# Patient Record
Sex: Female | Born: 1970 | Race: White | Hispanic: No | Smoking: Never smoker
Health system: Southern US, Community
[De-identification: ages and names within clinical notes are randomized; demographics above are authoritative.]

## PROBLEM LIST (undated history)

## (undated) ENCOUNTER — Emergency Department (HOSPITAL_COMMUNITY): Disposition: A | Discharge status: Home / Self Care | Payer: PRIVATE HEALTH INSURANCE

## (undated) DIAGNOSIS — I5042 Chronic combined systolic (congestive) and diastolic (congestive) heart failure: Secondary | ICD-10-CM

## (undated) DIAGNOSIS — I2510 Atherosclerotic heart disease of native coronary artery without angina pectoris: Secondary | ICD-10-CM

## (undated) DIAGNOSIS — F32A Depression, unspecified: Secondary | ICD-10-CM

## (undated) DIAGNOSIS — G4730 Sleep apnea, unspecified: Secondary | ICD-10-CM

## (undated) DIAGNOSIS — F329 Major depressive disorder, single episode, unspecified: Secondary | ICD-10-CM

## (undated) DIAGNOSIS — Z95810 Presence of automatic (implantable) cardiac defibrillator: Secondary | ICD-10-CM

## (undated) DIAGNOSIS — I10 Essential (primary) hypertension: Secondary | ICD-10-CM

## (undated) DIAGNOSIS — M1990 Unspecified osteoarthritis, unspecified site: Secondary | ICD-10-CM

## (undated) DIAGNOSIS — K219 Gastro-esophageal reflux disease without esophagitis: Secondary | ICD-10-CM

## (undated) DIAGNOSIS — R809 Proteinuria, unspecified: Secondary | ICD-10-CM

## (undated) DIAGNOSIS — I499 Cardiac arrhythmia, unspecified: Secondary | ICD-10-CM

## (undated) HISTORY — DX: Depression, unspecified: F32.A

## (undated) HISTORY — PX: TOOTH EXTRACTION: SUR596

## (undated) HISTORY — PX: NOSE SURGERY: SHX723

## (undated) HISTORY — PX: WISDOM TOOTH EXTRACTION: SHX21

## (undated) HISTORY — DX: Essential (primary) hypertension: I10

## (undated) HISTORY — PX: TONSILLECTOMY: SUR1361

## (undated) HISTORY — DX: Proteinuria, unspecified: R80.9

## (undated) HISTORY — PX: CARDIAC CATHETERIZATION: SHX172

## (undated) HISTORY — DX: Major depressive disorder, single episode, unspecified: F32.9

## (undated) HISTORY — DX: Atherosclerotic heart disease of native coronary artery without angina pectoris: I25.10

## (undated) HISTORY — DX: Chronic combined systolic (congestive) and diastolic (congestive) heart failure: I50.42

---

## 1997-11-18 ENCOUNTER — Encounter: Admission: RE | Admit: 1997-11-18 | Discharge: 1997-11-18 | Payer: Self-pay | Admitting: Family Medicine

## 1998-04-28 ENCOUNTER — Encounter: Admission: RE | Admit: 1998-04-28 | Discharge: 1998-04-28 | Payer: Self-pay | Admitting: Family Medicine

## 1999-01-05 ENCOUNTER — Encounter: Admission: RE | Admit: 1999-01-05 | Discharge: 1999-01-05 | Payer: Self-pay | Admitting: Family Medicine

## 1999-01-10 ENCOUNTER — Encounter: Admission: RE | Admit: 1999-01-10 | Discharge: 1999-01-10 | Payer: Self-pay | Admitting: Family Medicine

## 1999-12-05 ENCOUNTER — Encounter: Admission: RE | Admit: 1999-12-05 | Discharge: 1999-12-05 | Payer: Self-pay | Admitting: Family Medicine

## 1999-12-07 ENCOUNTER — Encounter: Admission: RE | Admit: 1999-12-07 | Discharge: 1999-12-07 | Payer: Self-pay | Admitting: *Deleted

## 1999-12-11 ENCOUNTER — Encounter: Admission: RE | Admit: 1999-12-11 | Discharge: 1999-12-11 | Payer: Self-pay | Admitting: Family Medicine

## 2000-10-16 ENCOUNTER — Other Ambulatory Visit: Admission: RE | Admit: 2000-10-16 | Discharge: 2000-10-16 | Payer: Self-pay | Admitting: *Deleted

## 2001-01-09 ENCOUNTER — Ambulatory Visit (HOSPITAL_COMMUNITY): Admission: RE | Admit: 2001-01-09 | Discharge: 2001-01-09 | Payer: Self-pay | Admitting: Family Medicine

## 2001-01-09 ENCOUNTER — Encounter: Payer: Self-pay | Admitting: Family Medicine

## 2001-10-19 ENCOUNTER — Other Ambulatory Visit: Admission: RE | Admit: 2001-10-19 | Discharge: 2001-10-19 | Payer: Self-pay | Admitting: Obstetrics and Gynecology

## 2002-08-23 ENCOUNTER — Other Ambulatory Visit: Admission: RE | Admit: 2002-08-23 | Discharge: 2002-08-23 | Payer: Self-pay | Admitting: Obstetrics & Gynecology

## 2002-11-15 ENCOUNTER — Ambulatory Visit (HOSPITAL_COMMUNITY): Admission: RE | Admit: 2002-11-15 | Discharge: 2002-11-15 | Payer: Self-pay | Admitting: Obstetrics & Gynecology

## 2003-01-12 ENCOUNTER — Ambulatory Visit (HOSPITAL_COMMUNITY): Admission: RE | Admit: 2003-01-12 | Discharge: 2003-01-12 | Payer: Self-pay | Admitting: *Deleted

## 2003-03-17 ENCOUNTER — Inpatient Hospital Stay (HOSPITAL_COMMUNITY): Admission: AD | Admit: 2003-03-17 | Discharge: 2003-03-19 | Payer: Self-pay | Admitting: Obstetrics & Gynecology

## 2003-03-20 ENCOUNTER — Encounter: Admission: RE | Admit: 2003-03-20 | Discharge: 2003-04-19 | Payer: Self-pay | Admitting: Obstetrics & Gynecology

## 2003-04-20 ENCOUNTER — Encounter: Admission: RE | Admit: 2003-04-20 | Discharge: 2003-05-20 | Payer: Self-pay | Admitting: Obstetrics & Gynecology

## 2003-04-28 ENCOUNTER — Other Ambulatory Visit: Admission: RE | Admit: 2003-04-28 | Discharge: 2003-04-28 | Payer: Self-pay | Admitting: Obstetrics & Gynecology

## 2003-05-21 ENCOUNTER — Encounter: Admission: RE | Admit: 2003-05-21 | Discharge: 2003-06-20 | Payer: Self-pay | Admitting: Obstetrics & Gynecology

## 2003-07-19 ENCOUNTER — Encounter: Admission: RE | Admit: 2003-07-19 | Discharge: 2003-08-18 | Payer: Self-pay | Admitting: Obstetrics & Gynecology

## 2003-09-18 ENCOUNTER — Encounter: Admission: RE | Admit: 2003-09-18 | Discharge: 2003-10-18 | Payer: Self-pay | Admitting: Obstetrics & Gynecology

## 2003-11-18 ENCOUNTER — Encounter: Admission: RE | Admit: 2003-11-18 | Discharge: 2003-12-18 | Payer: Self-pay | Admitting: Obstetrics & Gynecology

## 2003-12-19 ENCOUNTER — Encounter: Admission: RE | Admit: 2003-12-19 | Discharge: 2004-01-18 | Payer: Self-pay | Admitting: Obstetrics & Gynecology

## 2004-12-13 ENCOUNTER — Encounter: Admission: RE | Admit: 2004-12-13 | Discharge: 2004-12-13 | Payer: Self-pay | Admitting: Obstetrics & Gynecology

## 2004-12-27 ENCOUNTER — Encounter: Admission: RE | Admit: 2004-12-27 | Discharge: 2004-12-27 | Payer: Self-pay | Admitting: Obstetrics & Gynecology

## 2006-12-03 ENCOUNTER — Ambulatory Visit: Payer: Self-pay | Admitting: *Deleted

## 2006-12-03 ENCOUNTER — Inpatient Hospital Stay (HOSPITAL_COMMUNITY): Admission: AD | Admit: 2006-12-03 | Discharge: 2006-12-05 | Payer: Self-pay | Admitting: Psychiatry

## 2008-02-05 ENCOUNTER — Ambulatory Visit (HOSPITAL_COMMUNITY): Admission: RE | Admit: 2008-02-05 | Discharge: 2008-02-05 | Payer: Self-pay | Admitting: Family Medicine

## 2008-04-08 HISTORY — PX: BACK SURGERY: SHX140

## 2008-07-14 ENCOUNTER — Ambulatory Visit (HOSPITAL_COMMUNITY): Admission: RE | Admit: 2008-07-14 | Discharge: 2008-07-14 | Payer: Self-pay | Admitting: Neurosurgery

## 2009-06-15 ENCOUNTER — Ambulatory Visit: Admission: RE | Admit: 2009-06-15 | Discharge: 2009-06-15 | Payer: Self-pay | Admitting: Family Medicine

## 2010-01-18 ENCOUNTER — Encounter: Admission: RE | Admit: 2010-01-18 | Discharge: 2010-01-18 | Payer: Self-pay | Admitting: Obstetrics & Gynecology

## 2010-03-19 ENCOUNTER — Ambulatory Visit (HOSPITAL_COMMUNITY)
Admission: RE | Admit: 2010-03-19 | Discharge: 2010-03-19 | Payer: Self-pay | Source: Home / Self Care | Attending: Family Medicine | Admitting: Family Medicine

## 2010-03-26 ENCOUNTER — Ambulatory Visit (HOSPITAL_COMMUNITY)
Admission: RE | Admit: 2010-03-26 | Discharge: 2010-03-26 | Payer: Self-pay | Source: Home / Self Care | Attending: Family Medicine | Admitting: Family Medicine

## 2010-07-18 LAB — CBC
HCT: 37.5 % (ref 36.0–46.0)
Hemoglobin: 13.2 g/dL (ref 12.0–15.0)
MCHC: 35.3 g/dL (ref 30.0–36.0)
MCV: 91.2 fL (ref 78.0–100.0)
Platelets: 202 10*3/uL (ref 150–400)
RBC: 4.11 MIL/uL (ref 3.87–5.11)
RDW: 12.8 % (ref 11.5–15.5)
WBC: 7.1 10*3/uL (ref 4.0–10.5)

## 2010-08-21 NOTE — H&P (Signed)
Tara Bryant, DESIR NO.:  [id number]   MEDICAL RECORD NO.:  [id number]          PATIENT TYPE:  IPS   LOCATION:  0504                          [facility]ACILITY:  [facility]   PHYSICIAN:  Jasmine Pang, M.D. DATE OF BIRTH:  1970/06/12   DATE OF ADMISSION:  12/03/2006  DATE OF DISCHARGE:                       PSYCHIATRIC ADMISSION ASSESSMENT   IDENTIFYING INFORMATION:  This is a 40 year old married white female who  was voluntarily admitted on December 03, 2006.   HISTORY OF PRESENT ILLNESS:  The patient presents with a history of  depression and anxiety and homicidal thoughts towards a woman that her  husband has gotten familiar with, an employee that he works with.  She  states her husband was concerned again about her statements that she had  made about shooting this person and had brought her in for assessment.  The patient is trying to understand this situation of his friendship  with her but cannot get this off her mind.  She has been having  difficulty sleeping.  Her appetite has been satisfactory.  She denies  any thoughts to hurt herself.  Has thought about suicidal thoughts but  states that her deterrent to harming herself is the legacy that she  would leave behind for her daughter and her religious faith.  Her  stressors are the patient is feeling stressed with her father being ill,  conflict with her stepmother and her brother who she was hoping that he  would be living close to her is now going to be moving to [place].   PAST PSYCHIATRIC HISTORY:  This is her [facility] admission to [facility].  She sees Dr. Milagros Evener for medications and Hurley Cisco for therapy.   SOCIAL HISTORY:  This is a 40 year old married white female, married for  11 years, has a 51-1/2-year-old daughter.  Stays at home with her child.  No legal problems and the patient is unemployed.   FAMILY HISTORY:  No history of any psychiatric illness.  There is a  family history of  breast cancer.   ALCOHOL/DRUG HISTORY:  Nonsmoker.  No alcohol.  No drug use.   PRIMARY CARE PHYSICIAN:  Primary care Jaysiah Marchetta is Dr. Alroy Dust in  [place].   MEDICAL HISTORY:  A recent history of gastritis and states that she was  told she was a borderline diabetic.   MEDICATIONS:  Has been taking Carafate four times a day, Prilosec twice  a day, birth control pill, Celexa 20 mg daily, will be taking that  medication for two more days and discontinue.  Cymbalta was added at 30  mg about three weeks ago with the intention to increase it to 60 mg  daily.  Reports compliance with her medication.   ALLERGIES:  No known allergies.   REVIEW OF SYSTEMS:  The patient any fever, chills, positive for  insomnia.  No appetite changes.  No nausea or vomiting.  Some stomach  pain.  No bruising, no dizziness.  No joint tenderness.  No headaches.  No blurred vision.   PHYSICAL EXAMINATION:  VITAL SIGNS:  Temperature is 98.4, heart rate 84,  respirations 20, blood pressure is 130/86.  She is 5 feet 6 inches tall.  She is 180 pounds.  GENERAL:  This is a young adult female.  She is in no acute physical  distress.  HEENT:  Her EOMs are intact.  NECK:  Negative lymphadenopathy.  Trachea is midline, smooth.  CHEST:  Clear, no wheezing, no rales.  BREASTS:  Exam was deferred.  HEART:  Regular rate and rhythm with no murmurs or gallops.  ABDOMEN:  Soft, nontender abdomen, positive bowel sounds.  PELVIC:  Exam was deferred.  GU:  Exam was deferred.  EXTREMITIES:  The patient moves all extremities, 5+ against resistance.  There is no edema, no deformity.  SKIN:  Warm and dry.  No rashes or lacerations were noted.  NEUROLOGIC:  Findings are intact and nonfocal.  No tics.  No tremors.  Upright AND steady gait.   LABORATORY DATA:  TSH is 3.705, glucose of 139, albumin is 3.  CBC is  within normal limits.   MENTAL STATUS EXAM:  She is a fully alert, cooperative female, casually  dressed, good eye  contact.  Speech is clear, normal pace and tone,  spontaneous conversation.  The patient's mood is overwhelmed.  Affect is  pleasant, becomes teary-eyed at times when speaking about her husband.  Thought process:  There is no evidence of any thought disorder.  She  denies any suicidal or homicidal thoughts at this time.  She states she  does not want to hurt anybody.  She speaks about not wanting to watch  any sort of violence on television but states she just got very angry  and had made that statement.  Cognitive function intact.  Memory is  good.  Judgment and insight are good.  Concentration intact.   DIAGNOSES:  AXIS I:  Depressive disorder not otherwise specified.  AXIS II:  Deferred.  AXIS III:  Gastritis.  AXIS IV:  Problems with primary support group, possible other  psychosocial problems.  AXIS V:  Current 45-50.   PLAN:  Contract for safety.  Stabilize her mood and thinking.  Will  continue with her medications with tapering the Celexa and Cymbalta.  Will have a family session as soon as possible with her husband.  The  patient is to continue with her therapy and see Dr. Evelene Croon for outpatient  medication management.   TENTATIVE LENGTH OF STAY:  Two to three days.      Landry Corporal, N.P.      Jasmine Pang, M.D.  Electronically Signed    JO/MEDQ  D:  12/04/2006  T:  12/05/2006  Job:  [id number]

## 2010-08-21 NOTE — Op Note (Signed)
NAMESTARLA, Tara Bryant             ACCOUNT NO.:  [id number]   MEDICAL RECORD NO.:  [id number]          PATIENT TYPE:  OIB   LOCATION:  3528                         FACILITY:  [facility]   PHYSICIAN:  Reinaldo Meeker, M.D. DATE OF BIRTH:  06/15/1970   DATE OF PROCEDURE:  07/14/2008  DATE OF DISCHARGE:  07/14/2008                               OPERATIVE REPORT   PREOPERATIVE DIAGNOSIS:  Herniated disk L5-S1 right.   POSTOPERATIVE DIAGNOSIS:  Herniated disk L5-S1 right.   PROCEDURE:  Right L5-S1 interlaminar laminotomy for excision of  herniated disk with operative microscope.   SECONDARY PROCEDURE:  Microdissection of L5-S1 disk and S1 nerve root.   SURGEON:  Reinaldo Meeker, MD   ASSISTANT:  Tia Alert, MD   PROCEDURE IN DETAIL:  After being placed in the prone position, the  patient's back was prepped and draped in the usual sterile fashion.  Localizing x-rays were taken prior to incision to identify the  appropriate level.  Midline incision was made above the spinous  processes of L5-S1.  Using Bovie cutting current, the incision was  carried down the spinous processes.  Subperiosteal dissection was then  carried out on the right-sided spinous processes of the lamina.  Self-  retaining retractor was placed for exposure.  X-rays showed approach to  the appropriate level.  Using a high-speed drill, the inferior one-third  of the L5 lamina, the medial one-third of the facet joint, and the  superior one-third of the S1 lamina were removed.  Residual bone  ligamentum flavum was removed in a piecemeal fashion.  The microscope  was draped and brought into field and used through the remainder of the  case.  Using microsection technique, the lateral aspect of the thecal  sac and S1 nerve were identified.  Further coagulation was carried out  down the floor of the canal to identify the L5-S1 disk which was found  to be remarkably herniated beneath the nerve root and medial to it.  After coagulating on the annulus, the fascia was incised with a 15  blade.  Using pituitary rongeurs and curettes, the disk space clean-out  was carried out.  At the same time, great care was taken to avoid injury  to the neural elements and this was successfully done.  At this point,  inspection was carried out in all directions for any evidence of  residual compression and none could be identified.  Large amounts of  irrigation were carried out and any bleeding controlled with bipolar  coagulation and Gelfoam.  The wound was then closed in multiple layers  of Vicryl in the muscle fascia, subcutaneous and subcuticular tissues  and staples were placed on the skin.  Sterile dressing was then applied  and the patient was extubated and taken to recovery room in stable  condition.           ______________________________  Reinaldo Meeker, M.D.     ROK/MEDQ  D:  07/14/2008  T:  07/15/2008  Job:  [id number]

## 2010-08-24 NOTE — H&P (Signed)
NAME:  Tara Bryant, Tara Bryant                       ACCOUNT NO.:  [id number]   MEDICAL RECORD NO.:  [id number]                   PATIENT TYPE:  INP   LOCATION:  9165                                 FACILITY:  [facility]   PHYSICIAN:  Genia Del, M.D.             DATE OF BIRTH:  1970/07/14   DATE OF ADMISSION:  03/17/2003  DATE OF DISCHARGE:                                HISTORY & PHYSICAL   HISTORY OF PRESENT ILLNESS:  The patient is a 40 year old, gravida 1, at 38+  weeks gestation, expected date of delivery, March 25, 2003.   REASON FOR ADMISSION:  Spontaneous rupture of membranes and spontaneous  labor this afternoon.   Increased uterine contractions every three to four minutes since around  lunchtime. The patient was evaluated in the office early afternoon and found  to be ruptured with clear fluid.  The cervix was 5 cm dilatation.  Fetal  movements positive.  No vaginal bleeding.  No PIH symptoms.   PAST MEDICAL HISTORY:  Negative except for a superficial venous thrombosis  postoperatively.   PAST SURGICAL HISTORY:  Positive for tonsillectomy, nasal surgery, and  wisdom teeth extraction.   ALLERGIES:  No known drug allergies.   MEDICATIONS:  Prenatal vitamins.   SOCIAL HISTORY:  Married and nonsmoker.   HISTORY OF PRESENT PREGNANCY:  First trimester labs were within normal  limits, A positive rubella immune.  In the second trimester, the patient had  a triple test which was within normal limits.  Ultrasound review of anatomy  was within normal limits at 21+ weeks and completed at 23+ weeks.  One-hour  GTT was increased at 183 at 26+ weeks, three-hour GTT was within normal  limits.  In the third trimester, blood pressures remained normal.  Uterine  height corresponded well.   REVIEW OF SYSTEMS:  CONSTITUTIONAL:  Negative.  HEENT:  RESPIRATORY:  CARDIOVASCULAR:  GENITOURINARY:  GASTROINTESTINAL:  DERMATOLOGIC:  NEUROLOGY:  ENDOCRINE:  Negative.   PHYSICAL EXAMINATION:   GENERAL:  No apparent distress.  Pain with uterine  contractions.  VITAL SIGNS:  Blood pressure 132/80, pulse 82, respiratory rate 20,  temperature 96.7.  LUNGS:  Clear.  HEART:  Regular rate and rhythm.  ABDOMEN:  Gravid.  Cephalic presentation.  PELVIC:  On admission 6 cm dilated, 100% effaced, vertex posterior occiput  at +2.  Membranes ruptured with clear fluid.  Fetal heart rate monitoring  140's with good accelerations, no decelerations.  Uterine contractions every  two to three minutes, lasting 70 seconds, moderate to severe.  Lower limbs  normal.   IMPRESSION:  1. Gravida 1 at 38 weeks 6 days gestation.  2. Spontaneous labor.  3. Spontaneous rupture of membranes.  4. Fetal heart rate monitoring reassuring.  5. Group B Strep negative.   PLAN:  Admit to labor and delivery, monitoring, expectant management with  probable vaginal delivery.  Genia Del, M.D.    ML/MEDQ  D:  03/17/2003  T:  03/17/2003  Job:  [id number]

## 2010-08-24 NOTE — Discharge Summary (Signed)
Tara Bryant, Tara Bryant             ACCOUNT NO.:  [id number]   MEDICAL RECORD NO.:  [id number]          PATIENT TYPE:  IPS   LOCATION:  0504                          [facility]   PHYSICIAN:  Jasmine Pang, M.D. DATE OF BIRTH:  1971/02/27   DATE OF ADMISSION:  12/03/2006  DATE OF DISCHARGE:  12/05/2006                               DISCHARGE SUMMARY   IDENTIFYING INFORMATION:  This is a 40 year old white married female who  was voluntarily admitted on December 03, 2006.   HISTORY OF PRESENT ILLNESS:  The patient presents with a history of  depression and anxiety and homicidal thoughts towards a woman that her  husband has gotten familiar with, an employee that he works with.  She  states that her husband was concerned again about her statements that  she had made about shooting this person and had brought her in for  assessment.  The patient is trying to understand the situation of his  friendship with this woman, but cannot get it off her mind.  She has  been having difficulty sleeping.  Her appetite has been satisfactory.  She denies any thoughts to hurt herself.  She has had thoughts in the  past about suicide, but states the deterrent to harming herself is the  legacy she would leave behind for her daughter as well as her religious  faith.  Her stressors include, father being ill, conflict with step  mother and brother moving to [place].  She had been hoping he would live  near her and is grieving this loss.  This is the first admission to  [facility].  She sees Dr. Milagros Evener for medication.  She sees Hurley Cisco for therapy.  She knows of no family psychiatric  history.  She is a nonsmoker.  She does not use alcohol or drugs.  Primary care Rebbecca Osuna is Dr. Gerda Diss in [place].  She has had a recent  history of gastritis and states that she is also borderline diabetic.  She has been taking Carafate 4 times a day, Prilosec twice a day, birth  control pill,  and Celexa 20 mg daily.  She was to be taking this  medicine for 2 more days, then discontinue.  Cymbalta was added at 30 mg  about 3 weeks ago with the intention to go up to 60 mg.  She reports  compliance with her medications.  She denies any allergies.   PHYSICAL EXAMINATION:  A complete physical exam was within normal  limits.  There were no acute physical or medical problems.  No focal  neurologic signs.   ADMISSION LABORATORIES:  TSH was 3.705, glucose 139, which was slightly  elevated.  CBC was within normal limits.  Hepatic profile was remarkable  for a slightly decreased albumin at 3 (3.5-5.2).  Urine drug screen was  negative.  Urinalysis was negative.   HOSPITAL COURSE:  Upon admission, the patient was continued on her  Sucralfate 1 g p.o. after meals and at bedtime and her Cymbalta 30 mg  p.o. q.a.m. as well as Prilosec OTC 1 p.o. b.i.d.  She was also  placed  on Celexa 20 mg p.o. q.a.m. x2 days, then discontinue.  She was allowed  to use her own supply of birth control pills (Microgestin Fe) 1 p.o.  q.a.m..  The patient tolerated her medications well with no significant  side effects.  She was friendly and cooperative with me.  She states  this is her first time in the hospital.  She has had outpatient  treatment with Dr. Evelene Croon and Hurley Cisco.  She states her family  issues have been a stressor for her.  She is upset that her husband's  involvement with another woman at work.  She began to look at ways to  shoot herself and states she did have a gun.  This gun was secured by  her husband.  On December 05, 2006, the patient had a family session with  her husband both talked about problems in their relationship in what  they were going to do towards mending them.  Each agreed that they  wanted their relationship to last.  She stated her husband indicated he  had been depressed and she had not noticed this since she is focusing on  other things.  The husband stated he was  reaching out to others because  his loneliness.  Both intended to continue their couples therapy.  The  patient wanted to go home, but husband was hesitant for discharge  because he planned to go with employees from his office [employer]  tomorrow and he did not want to leave his wife alone.  He decided,  however, that he would take her with him.  It was felt the patient was  safe to be discharged.     The woman the patient was threatening was also notified about the  patient's threats to harm her.   DISCHARGE DIAGNOSES:  AXIS I:  Depressive disorder, not otherwise  specified.  AXIS II:  None.  AXIS III:  Gastritis.  AXIS IV:  Severe (problems with primary support group, other  psychosocial problems, burden of psychiatric illness, burden of medical  illness).  AXIS V:  Global assessment of functioning at discharge was 55.  Global  assessment of functioning upon admission was 45-50.  Global assessment  of functioning highest past year was 65.   DISCHARGE PLAN:  There were no specific activity level or dietary  restrictions.   POST-HOSPITAL CARE PLANS:  The patient will see Hurley Cisco on  December 09, 2006 at 12:00 noon.  She will also continue to see Dr.  Evelene Croon, her psychiatrist and has an appointment scheduled with her.   DISCHARGE MEDICATIONS:  Carafate 1 g at bedtime, Cymbalta 30 mg daily,  Prilosec as directed, Celexa 20 mg daily to be discontinued after 1 day,  Microgestin Fe 1 pill daily, and Ambien 5 mg p.o. q.h.s.      Jasmine Pang, M.D.  Electronically Signed     BHS/MEDQ  D:  12/30/2006  T:  12/30/2006  Job:  [id number]

## 2011-01-18 LAB — URINALYSIS, ROUTINE W REFLEX MICROSCOPIC
Bilirubin Urine: NEGATIVE
Glucose, UA: NEGATIVE
Hgb urine dipstick: NEGATIVE
Ketones, ur: NEGATIVE
Nitrite: NEGATIVE
Protein, ur: NEGATIVE
Specific Gravity, Urine: 1.022
Urobilinogen, UA: 0.2
pH: 6.5

## 2011-01-18 LAB — DRUGS OF ABUSE SCREEN W/O ALC, ROUTINE URINE
Amphetamine Screen, Ur: NEGATIVE
Barbiturate Quant, Ur: NEGATIVE
Benzodiazepines.: NEGATIVE
Cocaine Metabolites: NEGATIVE
Creatinine,U: 157.5
Marijuana Metabolite: NEGATIVE
Methadone: NEGATIVE
Opiate Screen, Urine: NEGATIVE
Phencyclidine (PCP): NEGATIVE
Propoxyphene: NEGATIVE

## 2011-01-18 LAB — COMPREHENSIVE METABOLIC PANEL
ALT: 19
AST: 14
Albumin: 3 — ABNORMAL LOW
Alkaline Phosphatase: 58
BUN: 9
CO2: 28
Calcium: 8.8
Chloride: 105
Creatinine, Ser: 0.77
GFR calc Af Amer: >60
GFR calc non Af Amer: >60
Glucose, Bld: 139 — ABNORMAL HIGH
Potassium: 3.8
Sodium: 140
Total Bilirubin: 0.6
Total Protein: 6.1

## 2011-01-18 LAB — TSH: TSH: 3.705

## 2011-01-18 LAB — CBC
HCT: 37.6
Hemoglobin: 13
MCHC: 34.6
MCV: 90.9
Platelets: 209
RBC: 4.14
RDW: 12.4
WBC: 7.5

## 2011-01-18 LAB — PREGNANCY, URINE: Preg Test, Ur: NEGATIVE

## 2011-01-25 ENCOUNTER — Other Ambulatory Visit: Payer: Self-pay | Admitting: Obstetrics & Gynecology

## 2011-01-25 DIAGNOSIS — Z1231 Encounter for screening mammogram for malignant neoplasm of breast: Secondary | ICD-10-CM

## 2011-01-31 ENCOUNTER — Ambulatory Visit: Payer: Self-pay

## 2011-03-06 ENCOUNTER — Ambulatory Visit
Admission: RE | Admit: 2011-03-06 | Discharge: 2011-03-06 | Disposition: A | Discharge status: Home / Self Care | Payer: 59 | Source: Ambulatory Visit | Attending: Obstetrics & Gynecology | Admitting: Obstetrics & Gynecology

## 2011-03-06 DIAGNOSIS — Z1231 Encounter for screening mammogram for malignant neoplasm of breast: Secondary | ICD-10-CM

## 2011-03-27 ENCOUNTER — Emergency Department (INDEPENDENT_AMBULATORY_CARE_PROVIDER_SITE_OTHER): Payer: 59

## 2011-03-27 ENCOUNTER — Emergency Department (HOSPITAL_COMMUNITY)
Admission: EM | Admit: 2011-03-27 | Discharge: 2011-03-27 | Disposition: A | Discharge status: Home / Self Care | Payer: 59 | Source: Home / Self Care | Attending: Family Medicine | Admitting: Family Medicine

## 2011-03-27 DIAGNOSIS — S6000XA Contusion of unspecified finger without damage to nail, initial encounter: Secondary | ICD-10-CM

## 2011-03-27 HISTORY — DX: Cardiac arrhythmia, unspecified: I49.9

## 2011-03-27 MED ORDER — HYDROCODONE-ACETAMINOPHEN 5-325 MG PO TABS: 2.0000 | ORAL_TABLET | ORAL | Status: AC | PRN

## 2011-03-27 NOTE — ED Notes (Signed)
Pt states she shut her lt index and middle fingers in mini van door earlier today.  C/o pain and swelling , esp to lt middle finger

## 2011-03-27 NOTE — ED Provider Notes (Signed)
History     CSN: [id number] Arrival date & time: 03/27/2011  7:31 PM   First MD Initiated Contact with Patient 03/27/11 1855      Chief Complaint  Patient presents with  . Finger Injury    (Consider location/radiation/quality/duration/timing/severity/associated sxs/prior treatment) Patient is a 40 y.o. female presenting with hand pain. The history is provided by the patient. No language interpreter was used.  Hand Pain This is a new problem. The current episode started 6 to 12 hours ago. The problem occurs constantly. The problem has not changed since onset.The symptoms are aggravated by bending. She has tried nothing for the symptoms. The treatment provided no relief.  Pt crushed fingers in a car door.  Pt complains of swelling and bruising to end of finger.  Past Medical History  Diagnosis Date  . Diabetes mellitus   . Arrhythmia     Past Surgical History  Procedure Date  . Nose surgery   . Back surgery   . Tonsillectomy     No family history on file.  History  Substance Use Topics  . Smoking status: Never Smoker   . Smokeless tobacco: Not on file  . Alcohol Use: No    OB History    Grav Para Term Preterm Abortions TAB SAB Ect Mult Living                  Review of Systems  Musculoskeletal: Positive for joint swelling.  Skin: Positive for color change.  All other systems reviewed and are negative.    Allergies  Review of patient's allergies indicates no known allergies.  Home Medications   Current Outpatient Rx  Name Route Sig Dispense Refill  . COREG PO Oral Take 2 mg by mouth 2 (two) times daily.      Marland Kitchen METFORMIN HCL 1000 MG PO TABS Oral Take 1,000 mg by mouth 2 (two) times daily with a meal.      . NORETHIN ACE-ETH ESTRAD-FE 1-20 MG-MCG PO TABS Oral Take 1 tablet by mouth daily.        BP 132/87  Pulse 92  Temp(Src) 97.9 F (36.6 C) (Oral)  Resp 20  SpO2 100%  LMP 03/24/2011  Physical Exam  Vitals reviewed. Constitutional: She appears  well-developed and well-nourished.  HENT:  Head: Normocephalic.  Musculoskeletal: She exhibits tenderness.       Bruised finger tips,  From  nv and ns intact  Neurological: She is alert. She has normal reflexes.  Skin: Skin is warm and dry.  Psychiatric: She has a normal mood and affect.    ED Course  Procedures (including critical care time)  Labs Reviewed - No data to display Dg Hand 2 View Left  03/27/2011  *RADIOLOGY REPORT*  Clinical Data: Crush injury  LEFT HAND - 2 VIEW  Comparison: None.  Findings: Four views of the left hand submitted.  No acute fracture or subluxation.  No radiopaque foreign body.  IMPRESSION: No acute fracture or subluxation.  Original Report Authenticated By: Natasha Mead, M.D.     No diagnosis found.    MDM     No fx,  Pt placed in a splint,  Hydrocodone #10     Langston Masker, [place] 03/27/11 2032  Langston Masker, [place] 03/27/11 2034

## 2011-03-27 NOTE — ED Provider Notes (Signed)
Medical screening examination/treatment/procedure(s) were performed by non-physician practitioner and as supervising physician I was immediately available for consultation/collaboration.  Karter Haire   James Lafalce, MD 03/27/11 2056 

## 2011-09-11 ENCOUNTER — Encounter: Payer: 59 | Attending: Family Medicine | Admitting: *Deleted

## 2011-09-11 ENCOUNTER — Encounter: Payer: Self-pay | Admitting: *Deleted

## 2011-09-11 DIAGNOSIS — E119 Type 2 diabetes mellitus without complications: Secondary | ICD-10-CM | POA: Insufficient documentation

## 2011-09-11 DIAGNOSIS — Z713 Dietary counseling and surveillance: Secondary | ICD-10-CM | POA: Insufficient documentation

## 2011-09-11 NOTE — Progress Notes (Signed)
  Medical Nutrition Therapy:  Appt start time: 0900 end time:  1000.   Assessment:  Primary concerns today: diabetes.   MEDICATIONS: see list   DIETARY INTAKE:  Usual eating pattern includes 2-3 meals and 1-2 snacks per day.  24-hr recall:  B ( AM): cereal or oatmeal with eggs or bacon, pancakes.  unsweet tea, OJ, skim milk  Snk ( AM): none  L ( PM): taco bell burrito with water; salad or sandwich with unsweet tea or water; sometimes pepsi Snk ( PM): sometimes chips or candy D ( PM): sometimes meat , starch, vegetable with unsweetened beverage; sometimes pizza or eats out Snk ( PM): not usually Beverages: water and unsweet tea  Usual physical activity: none  Estimated energy needs: 1500 calories 170 g carbohydrates 112 g protein 42 g fat  Progress Towards Goal(s):  In progress.   Nutritional Diagnosis:  NB-1.6 Limited adherence to nutrition-related recommendations related to managing diabetes.  As evidenced by HgA1c of 11%.    Intervention:  Nutrition counseling provided. Patient has DM2 for several years and received diabetes education through a [place] class.  She admitted to not remembering the guidelines or following a specified meal plan.  Her life is very stressful due to a family situation and she has not been taking care of herself.  Discussed role of stress of glucose levels and encouraged her to find a mental health professional who can help her manage her stress.  Also encouraged physical activity to mange stress as well as glucose levels.  Discussed carb counting and reading food labels.  Encouraged regular GBM.  Handouts given during visit include: Carb Counting and Food Label handouts Meal Plan Card  Eating healthy while eating out  Monitoring/Evaluation:  Dietary intake, exercise, GBM, and body weight prn.  Patient will call to make appointment pending insurance benefits.

## 2011-09-11 NOTE — Patient Instructions (Addendum)
Goals:  Follow Diabetes Meal Plan as instructed  Eat 3 meals and 2 snacks, every 3-5 hrs  Limit carbohydrate intake to 30-45 grams carbohydrate/meal (2-3 carb choices)  Limit carbohydrate intake to 15 grams carbohydrate/snack (1 carb choice) may have lean protein or vegetable with snack  Add lean protein foods to meals/snacks  Talk with doctor about glucose monitoring, aim to check at least 1 time a day  Aim for 30 mins of physical activity daily  Bring food record and glucose log to your next nutrition visit  Look into purchasing Calorie Brooke Dare book

## 2012-05-27 ENCOUNTER — Other Ambulatory Visit: Payer: Self-pay | Admitting: Obstetrics & Gynecology

## 2012-05-27 DIAGNOSIS — Z1231 Encounter for screening mammogram for malignant neoplasm of breast: Secondary | ICD-10-CM

## 2012-06-24 ENCOUNTER — Ambulatory Visit
Admission: RE | Admit: 2012-06-24 | Discharge: 2012-06-24 | Disposition: A | Discharge status: Home / Self Care | Payer: BC Managed Care – PPO | Source: Ambulatory Visit | Attending: Obstetrics & Gynecology | Admitting: Obstetrics & Gynecology

## 2012-06-24 DIAGNOSIS — Z1231 Encounter for screening mammogram for malignant neoplasm of breast: Secondary | ICD-10-CM

## 2012-07-06 ENCOUNTER — Other Ambulatory Visit: Payer: Self-pay | Admitting: *Deleted

## 2012-07-06 MED ORDER — LISINOPRIL 2.5 MG PO TABS: 2.5000 mg | ORAL_TABLET | Freq: Every day | ORAL | Status: DC

## 2012-07-30 ENCOUNTER — Ambulatory Visit (INDEPENDENT_AMBULATORY_CARE_PROVIDER_SITE_OTHER): Payer: BC Managed Care – PPO | Admitting: Family Medicine

## 2012-07-30 ENCOUNTER — Encounter: Payer: Self-pay | Admitting: Family Medicine

## 2012-07-30 VITALS — BP 118/84 | Temp 98.3°F | Wt 187.2 lb

## 2012-07-30 DIAGNOSIS — E119 Type 2 diabetes mellitus without complications: Secondary | ICD-10-CM

## 2012-07-30 DIAGNOSIS — R7309 Other abnormal glucose: Secondary | ICD-10-CM

## 2012-07-30 DIAGNOSIS — E1129 Type 2 diabetes mellitus with other diabetic kidney complication: Secondary | ICD-10-CM

## 2012-07-30 DIAGNOSIS — IMO0001 Reserved for inherently not codable concepts without codable children: Secondary | ICD-10-CM

## 2012-07-30 DIAGNOSIS — R739 Hyperglycemia, unspecified: Secondary | ICD-10-CM

## 2012-07-30 DIAGNOSIS — R809 Proteinuria, unspecified: Secondary | ICD-10-CM

## 2012-07-30 DIAGNOSIS — I10 Essential (primary) hypertension: Secondary | ICD-10-CM

## 2012-07-30 LAB — GLUCOSE, POCT (MANUAL RESULT ENTRY): POC Glucose: 155 mg/dl — AB (ref 70–99)

## 2012-07-30 NOTE — Progress Notes (Signed)
  Subjective:    [name], female    DOB: 06-17-1970, 42 y.o.   MRN: [id number]  Diabetes She has type 2 diabetes mellitus. Hypoglycemia symptoms include nervousness/anxiousness. (Felt shaky) There are no diabetic associated symptoms. Symptoms are improving. There are no known risk factors for coronary artery disease. Current diabetic treatment includes insulin injections. She is following a diabetic diet. Her home blood glucose trend is decreasing steadily.    Results for orders placed in visit on 07/30/12  GLUCOSE, POCT (MANUAL RESULT ENTRY)      Result Value Range   POC Glucose 155 (*) 70 - 99 mg/dl   patient notes significant ongoing stress. She claims compliance with her blood pressure medicine. Not exercising as much as she hoped. Trying to watch her diet. Has cut down salt at times.  Patient has been experiencing hypoglycemic spells. Worse when sugars suddenly dropped. Frustrating to patient. Worse in the mid day.  Ongoing stress and anxiety. Status post the force. Still impacting on her daughter.   Review of Systems  Psychiatric/Behavioral: The patient is nervous/anxious.        Objective:   Physical Exam  Impression alert no acute distress. HEENT normal. Vitals reviewed. Lungs clear. Heart regular rate and rhythm. Feet sensation intact pulses good no significant edema.      Assessment & Plan:  Impression 1 hypoglycemic spells discussed at length. #2 type 2 diabetes. #3 hypertension. #4 proteinuria. #5 anxiety stress and insomnia. Plan exercise discussed in encourage. Cut glipizide dose and one half. Rationale discussed. Diet exercise discussed. Recheck in several months. Maintain all other meds. WSL

## 2012-07-30 NOTE — Patient Instructions (Addendum)
Glipizide 5 mg--break in half and do not exceed. Please exercise regularly.

## 2012-10-15 ENCOUNTER — Ambulatory Visit (INDEPENDENT_AMBULATORY_CARE_PROVIDER_SITE_OTHER): Payer: BC Managed Care – PPO | Admitting: Nurse Practitioner

## 2012-10-15 ENCOUNTER — Encounter: Payer: Self-pay | Admitting: Nurse Practitioner

## 2012-10-15 VITALS — BP 122/84 | Temp 98.2°F | Wt 195.0 lb

## 2012-10-15 DIAGNOSIS — T148 Other injury of unspecified body region: Secondary | ICD-10-CM

## 2012-10-15 DIAGNOSIS — M545 Low back pain, unspecified: Secondary | ICD-10-CM

## 2012-10-15 DIAGNOSIS — W57XXXA Bitten or stung by nonvenomous insect and other nonvenomous arthropods, initial encounter: Secondary | ICD-10-CM

## 2012-10-15 DIAGNOSIS — R300 Dysuria: Secondary | ICD-10-CM

## 2012-10-15 LAB — POCT URINALYSIS DIPSTICK
Blood, UA: 250
Glucose, UA: 500
Spec Grav, UA: 1.015
pH, UA: 8

## 2012-10-15 LAB — POCT UA - MICROSCOPIC ONLY
Bacteria, U Microscopic: 0
Epithelial cells, urine per micros: 0
WBC, Ur, HPF, POC: 0

## 2012-10-15 MED ORDER — METHOCARBAMOL 750 MG PO TABS: 750.0000 mg | ORAL_TABLET | Freq: Three times a day (TID) | ORAL | Status: DC

## 2012-10-15 MED ORDER — NAPROXEN 500 MG PO TABS: 500.0000 mg | ORAL_TABLET | Freq: Two times a day (BID) | ORAL | Status: DC

## 2012-10-16 NOTE — Progress Notes (Signed)
Subjective:  Presents with complaints of pain in the right mid to low back area for the past 2 days. Localized. Currently on her menstrual cycle. Has been doing a lot of pulling and lifting. No specific history of injury. No urinary frequency urgency or dysuria. No fevers. No nausea vomiting. Also patient has 3 resolving tick bites around the abdominal area. No unexplained headache. No other rash.  Objective:   BP 122/84  Temp(Src) 98.2 F (36.8 C) (Oral)  Wt 195 lb (88.451 kg)  BMI 30.07 kg/m2  LMP 10/11/2012 NAD. Alert, oriented. Lungs clear. Heart regular rate rhythm. No CVA area tenderness. Very tight tender muscles noted along the lower thoracic/lumbar area right side. No spinal tenderness. SLR negative bilaterally reflexes normal limit lower extremities. Gait normal limit. Urine microscopic negative. 3 nonraised slightly discolored areas noted on the abdominal area from previous tick bites. No other rash noted.  Assessment:Dysuria - Plan: POCT urinalysis dipstick, POCT UA - Microscopic Only  Low back pain  Tick bites  Plan: Meds ordered this encounter  Medications  . naproxen (NAPROSYN) 500 MG tablet    Sig: Take 1 tablet (500 mg total) by mouth 2 (two) times daily with a meal. Prn pain    Dispense:  30 tablet    Refill:  0    Order Specific Question:  Supervising Provider    Answer:  Merlyn Albert [[id number]]  . methocarbamol (ROBAXIN) 750 MG tablet    Sig: Take 1 tablet (750 mg total) by mouth 3 (three) times daily. Prn muscle spasms    Dispense:  30 tablet    Refill:  0    Order Specific Question:  Supervising Provider    Answer:  Riccardo Dubin   Ice/heat applications. Gentle stretching. Call back if worsens or persists. Reviewed signs and symptoms of tick fever. OTC hydrocortisone cream as directed for tick bites. Followup in a couple weeks as planned for her chronic health issues.

## 2012-10-21 ENCOUNTER — Other Ambulatory Visit: Payer: Self-pay | Admitting: Family Medicine

## 2012-10-29 ENCOUNTER — Ambulatory Visit: Payer: BC Managed Care – PPO | Admitting: Family Medicine

## 2012-12-01 ENCOUNTER — Encounter: Payer: Self-pay | Admitting: Family Medicine

## 2012-12-01 ENCOUNTER — Ambulatory Visit (INDEPENDENT_AMBULATORY_CARE_PROVIDER_SITE_OTHER): Payer: BC Managed Care – PPO | Admitting: Family Medicine

## 2012-12-01 VITALS — BP 120/84 | Ht 67.5 in | Wt 195.0 lb

## 2012-12-01 DIAGNOSIS — E119 Type 2 diabetes mellitus without complications: Secondary | ICD-10-CM

## 2012-12-01 DIAGNOSIS — E782 Mixed hyperlipidemia: Secondary | ICD-10-CM

## 2012-12-01 DIAGNOSIS — F329 Major depressive disorder, single episode, unspecified: Secondary | ICD-10-CM

## 2012-12-01 DIAGNOSIS — M25569 Pain in unspecified knee: Secondary | ICD-10-CM

## 2012-12-01 DIAGNOSIS — Z79899 Other long term (current) drug therapy: Secondary | ICD-10-CM

## 2012-12-01 DIAGNOSIS — I878 Other specified disorders of veins: Secondary | ICD-10-CM | POA: Insufficient documentation

## 2012-12-01 DIAGNOSIS — IMO0001 Reserved for inherently not codable concepts without codable children: Secondary | ICD-10-CM

## 2012-12-01 DIAGNOSIS — I10 Essential (primary) hypertension: Secondary | ICD-10-CM

## 2012-12-01 DIAGNOSIS — I872 Venous insufficiency (chronic) (peripheral): Secondary | ICD-10-CM

## 2012-12-01 DIAGNOSIS — R809 Proteinuria, unspecified: Secondary | ICD-10-CM

## 2012-12-01 DIAGNOSIS — E1129 Type 2 diabetes mellitus with other diabetic kidney complication: Secondary | ICD-10-CM

## 2012-12-01 DIAGNOSIS — F32A Depression, unspecified: Secondary | ICD-10-CM | POA: Insufficient documentation

## 2012-12-01 LAB — POCT GLYCOSYLATED HEMOGLOBIN (HGB A1C): Hemoglobin A1C: 8.6

## 2012-12-01 NOTE — Progress Notes (Signed)
  Subjective:    [name], female    DOB: April 21, 1970, 42 y.o.   MRN: [id number]  Diabetes She presents for her follow-up diabetic visit. She has type 2 diabetes mellitus. Her disease course has been improving. Pertinent negatives for diabetes include no blurred vision and no chest pain. Pertinent negatives for hypoglycemia complications include no blackouts, no hospitalization and no nocturnal hypoglycemia. Symptoms are improving. Risk factors for coronary artery disease include diabetes mellitus and hypertension. Her weight is fluctuating minimally. She is following a generally unhealthy diet. Meal planning includes avoidance of concentrated sweets. She participates in exercise intermittently. Her home blood glucose trend is increasing steadily. Her breakfast blood glucose is taken between 7-8 am. Her breakfast blood glucose range is generally 180-200 mg/dl. An ACE inhibitor/angiotensin II receptor blocker is being taken.   Results for orders placed in visit on 12/01/12  POCT GLYCOSYLATED HEMOGLOBIN (HGB A1C)      Result Value Range   Hemoglobin A1C 8.6     22 u qhs takes faithfully.   Legs are very sore, late at night, painful. Very active, feels painful. Stiff or achey in thre morn. No true numbness or tingling. Recalls no sudden injury of her leg.  Compliant with her blood pressure medicine. Trying to watch her diet. Not walking much.  States she did have periods of feeling down diminished energy felt blue and sad. No suicidal thoughts. Somewhat better at this time. Would prefer to hold off on medications at this time.  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  Bit by jellyfish--now that's better.  Patient arrives for a diabetic check up. Patient states she is having problems with her legs that concern her.  Review of Systems  Eyes: Negative for blurred vision.  Cardiovascular: Negative for chest pain.   ROS otherwise negative     Objective:   Physical  Exam   Alert no acute distress. Lungs clear. Heart regular in rhythm. HEENT normal. Feet pulses good sensation intact some venous stasis noted right knee crepitations noted. Mental status quiet mood somewhat subdued claims no overt depression     Assessment & Plan:  Impression type 2 diabetes control improving but not ideal discussed #2 hypertension good control. #3 history of proteinuria. #4 depression some symptoms but would prefer no meds. #5 pain leg. Bilateral nonspecific. Plan appropriate blood work. Diet discussed. Exercise discussed. Mental health discussed. I Dr. visit discussed. 35 minutes most spent in discussion. Recheck in 4 months. WSL further recommendations based on blood work. WSL

## 2012-12-01 NOTE — Patient Instructions (Signed)
Please increase the glipizide to one half tablet twice per day

## 2012-12-29 ENCOUNTER — Telehealth: Payer: Self-pay | Admitting: Family Medicine

## 2012-12-29 NOTE — Telephone Encounter (Signed)
Patient needs new prescription on glucose test stripes for accu chek  Called in [facility] [place].

## 2012-12-29 NOTE — Telephone Encounter (Signed)
Order faxed.

## 2013-01-12 ENCOUNTER — Other Ambulatory Visit: Payer: Self-pay

## 2013-01-12 MED ORDER — FENOFIBRATE 48 MG PO TABS: 48.0000 mg | ORAL_TABLET | Freq: Every day | ORAL | Status: DC

## 2013-01-12 NOTE — Telephone Encounter (Signed)
Rx was sent to pharmacy electronically. 

## 2013-02-02 ENCOUNTER — Ambulatory Visit (INDEPENDENT_AMBULATORY_CARE_PROVIDER_SITE_OTHER): Payer: BC Managed Care – PPO | Admitting: *Deleted

## 2013-02-02 DIAGNOSIS — Z23 Encounter for immunization: Secondary | ICD-10-CM

## 2013-03-24 ENCOUNTER — Telehealth: Payer: Self-pay | Admitting: Family Medicine

## 2013-03-24 ENCOUNTER — Other Ambulatory Visit: Payer: Self-pay | Admitting: Family Medicine

## 2013-03-24 LAB — BASIC METABOLIC PANEL
BUN: 12 mg/dL (ref 6–23)
CO2: 24 mEq/L (ref 19–32)
Calcium: 9.1 mg/dL (ref 8.4–10.5)
Chloride: 100 mEq/L (ref 96–112)
Creat: 0.69 mg/dL (ref 0.50–1.10)
Glucose, Bld: 301 mg/dL — ABNORMAL HIGH (ref 70–99)
Potassium: 4.3 mEq/L (ref 3.5–5.3)
Sodium: 136 mEq/L (ref 135–145)

## 2013-03-24 LAB — LIPID PANEL
Cholesterol: 174 mg/dL (ref 0–200)
HDL: 36 mg/dL — ABNORMAL LOW (ref >39)
LDL Cholesterol: 105 mg/dL — ABNORMAL HIGH (ref 0–99)
Total CHOL/HDL Ratio: 4.8 Ratio
Triglycerides: 164 mg/dL — ABNORMAL HIGH (ref <150)
VLDL: 33 mg/dL (ref 0–40)

## 2013-03-24 LAB — HEPATIC FUNCTION PANEL
ALT: 19 U/L (ref 0–35)
AST: 13 U/L (ref 0–37)
Albumin: 3.9 g/dL (ref 3.5–5.2)
Alkaline Phosphatase: 70 U/L (ref 39–117)
Bilirubin, Direct: 0.1 mg/dL (ref 0.0–0.3)
Indirect Bilirubin: 0.4 mg/dL (ref 0.0–0.9)
Total Bilirubin: 0.5 mg/dL (ref 0.3–1.2)
Total Protein: 7 g/dL (ref 6.0–8.3)

## 2013-03-24 LAB — MICROALBUMIN, URINE: Microalb, Ur: 2.85 mg/dL — ABNORMAL HIGH (ref 0.00–1.89)

## 2013-03-24 NOTE — Telephone Encounter (Signed)
Pt had blood work this am, wants to know (since soltis told her we could), if we can add a Thyroid onto those draws?

## 2013-03-24 NOTE — Telephone Encounter (Signed)
Ok tsh

## 2013-03-25 LAB — TSH: TSH: 2.072 u[IU]/mL (ref 0.350–4.500)

## 2013-03-25 NOTE — Telephone Encounter (Signed)
Test added at lab. Pt notified.

## 2013-04-05 ENCOUNTER — Ambulatory Visit (INDEPENDENT_AMBULATORY_CARE_PROVIDER_SITE_OTHER): Payer: BC Managed Care – PPO | Admitting: Family Medicine

## 2013-04-05 ENCOUNTER — Encounter: Payer: Self-pay | Admitting: Family Medicine

## 2013-04-05 VITALS — BP 128/84 | Ht 67.5 in | Wt 190.6 lb

## 2013-04-05 DIAGNOSIS — I10 Essential (primary) hypertension: Secondary | ICD-10-CM

## 2013-04-05 DIAGNOSIS — IMO0001 Reserved for inherently not codable concepts without codable children: Secondary | ICD-10-CM

## 2013-04-05 DIAGNOSIS — E1129 Type 2 diabetes mellitus with other diabetic kidney complication: Secondary | ICD-10-CM

## 2013-04-05 DIAGNOSIS — R809 Proteinuria, unspecified: Secondary | ICD-10-CM

## 2013-04-05 LAB — POCT GLYCOSYLATED HEMOGLOBIN (HGB A1C): Hemoglobin A1C: 10

## 2013-04-05 MED ORDER — FLUCONAZOLE 150 MG PO TABS: 150.0000 mg | ORAL_TABLET | Freq: Every day | ORAL | Status: DC

## 2013-04-05 MED ORDER — AZITHROMYCIN 250 MG PO TABS: ORAL_TABLET | ORAL | Status: DC

## 2013-04-05 NOTE — Progress Notes (Signed)
   Subjective:    [name], female    DOB: Aug 17, 1970, 42 y.o.   MRN: [id number]  HPI Patient is here today to go over her blood work.  Last ck'ed about Aug She has also had a cold since Wednesday.  Cough and congestion. Headache. Frontal in nature. Nasal discharge intermittently. Results for orders placed in visit on 04/05/13  POCT GLYCOSYLATED HEMOGLOBIN (HGB A1C)      Result Value Range   Hemoglobin A1C 10.0     Patient not exercising. Admits to poor dietary compliance. Urinating more frequently. Vision somewhat poor. Under a lot of stress.  Claims compliance with blood pressure medication. No headache or chest pain decent appetite.  Review of Systems No abdominal pain no change in bowel habits no blood in stool no rash ROS otherwise negative    Objective:   Physical Exam  Alert no acute distress. Mild malaise. HEENT moderate nasal congestion frontal tenderness. Pharynx normal. Neck supple. Lungs clear. Heart regular in rhythm. Ankle edema.      Assessment & Plan:  Impression 1 type 2 diabetes and noncompliance. #2 rhinosinusitis. #3 hypertension good control. Plan Z-Pak. And exercise strongly encourage. Diet discussed. We'll titrate up Lantus and coming weeks in hopes of achieving good control. Patient encouraged to try hard to do this. WSL

## 2013-04-05 NOTE — Patient Instructions (Addendum)
Please start checking your sugar every morning   Add 4 units insulin to your lantus dose every week until your fasting numbers are mostly below 150. This really should work for you. Call if any questions/conerns   Please start exercising regularly  

## 2013-06-24 ENCOUNTER — Other Ambulatory Visit: Payer: Self-pay | Admitting: Family Medicine

## 2013-06-29 ENCOUNTER — Ambulatory Visit (INDEPENDENT_AMBULATORY_CARE_PROVIDER_SITE_OTHER): Payer: BC Managed Care – PPO | Admitting: Family Medicine

## 2013-06-29 ENCOUNTER — Encounter: Payer: Self-pay | Admitting: Family Medicine

## 2013-06-29 VITALS — BP 138/80 | Ht 67.5 in | Wt 193.0 lb

## 2013-06-29 DIAGNOSIS — F3289 Other specified depressive episodes: Secondary | ICD-10-CM

## 2013-06-29 DIAGNOSIS — F329 Major depressive disorder, single episode, unspecified: Secondary | ICD-10-CM

## 2013-06-29 DIAGNOSIS — E1110 Type 2 diabetes mellitus with ketoacidosis without coma: Secondary | ICD-10-CM

## 2013-06-29 DIAGNOSIS — E1310 Other specified diabetes mellitus with ketoacidosis without coma: Secondary | ICD-10-CM

## 2013-06-29 DIAGNOSIS — Z794 Long term (current) use of insulin: Secondary | ICD-10-CM | POA: Insufficient documentation

## 2013-06-29 DIAGNOSIS — I10 Essential (primary) hypertension: Secondary | ICD-10-CM

## 2013-06-29 DIAGNOSIS — I872 Venous insufficiency (chronic) (peripheral): Secondary | ICD-10-CM

## 2013-06-29 DIAGNOSIS — I878 Other specified disorders of veins: Secondary | ICD-10-CM

## 2013-06-29 DIAGNOSIS — F32A Depression, unspecified: Secondary | ICD-10-CM

## 2013-06-29 DIAGNOSIS — M25569 Pain in unspecified knee: Secondary | ICD-10-CM

## 2013-06-29 DIAGNOSIS — E119 Type 2 diabetes mellitus without complications: Secondary | ICD-10-CM

## 2013-06-29 LAB — POCT GLYCOSYLATED HEMOGLOBIN (HGB A1C): Hemoglobin A1C: 9.1

## 2013-06-29 MED ORDER — INSULIN GLARGINE 100 UNIT/ML ~~LOC~~ SOLN: 22.0000 [IU] | Freq: Every day | SUBCUTANEOUS | Status: DC

## 2013-06-29 MED ORDER — CITALOPRAM HYDROBROMIDE 20 MG PO TABS
20.0000 mg | ORAL_TABLET | Freq: Every day | ORAL | Status: DC
Start: 2013-06-29 — End: 2014-03-08

## 2013-06-29 MED ORDER — LORATADINE 10 MG PO TABS
10.0000 mg | ORAL_TABLET | Freq: Every day | ORAL | Status: DC
Start: 2013-06-29 — End: 2014-05-11

## 2013-06-29 NOTE — Progress Notes (Signed)
   Subjective:    [name], female    DOB: 1970/07/21, 43 y.o.   MRN: [id number]  Diabetes She presents for her follow-up diabetic visit. She has type 2 diabetes mellitus. There are no hypoglycemic associated symptoms. There are no diabetic associated symptoms. There are no hypoglycemic complications. Current diabetic treatment includes oral agent (monotherapy). She is compliant with treatment some of the time. Her weight is increasing steadily. Her home blood glucose trend is increasing rapidly.   Sciatic nerve acting up the past week,  Sig fatigue and falls asleep during the day. sleps so so at night, often falls asleep in the eve watching TV. A lot of feeling down, diminished energy at times.   Patient reports to feeling depressed at times. No suicidal thoughts. No longer on antidepressants. They didn't help in the past. Currently receiving therapy.   She did not follow our recommendations for Lantus titration unfortunately. Her glucose morning numbers are still quite elevated and she has only raise her Lantus twice in the last several months, despite [place] allowing a more aggressive ramping up of her numbers.  Admits to no exercise.  Admits to minimal compliance of diet.  States compliant with blood pressure medicine. Tried watch salt intake. No obvious side effects. lantus on thirty, Results for orders placed in visit on 06/29/13  POCT GLYCOSYLATED HEMOGLOBIN (HGB A1C)      Result Value Ref Range   Hemoglobin A1C 9.1      132 78  morning drainage and congestion. Does note a lot of allergies in recent weeks.  Review of Systems Some nasal congestion no chest pain no neck pain no abdominal pain no change in bowel habits no blood in stool ROS otherwise negative    Objective:   Physical Exam  Alert depressed affect. H&T slight nasal congestion neck supple. Lungs clear. Heart rare rhythm. Low back some tenderness to percussion. Negative straight leg raise on  right.  C. foot exam      Assessment & Plan:  Impression diabetes with. Significant noncompliance. Poor control. #2 depression ongoing challenge. #3 sciatica flaring up somewhat but not seriously. #4 hypertension good control. #5 allergic rhinitis. #6 noncompliance as noted. #7 disability questions patient states her father had brought this up. I frankly advised her that I think she could choose a less stressful job rather than consider disability plan diet exercise discussed encourage. Try once again to titrate up insulin. Loratadine as needed. Reinitiate Celexa rationale discussed. Recheck in several months. Easily 35-40 minutes spent with patient WSL

## 2013-06-29 NOTE — Patient Instructions (Signed)
Please start checking your sugar every morning   Add 4 units insulin to your lantus dose every week until your fasting numbers are mostly below 150. This really should work for you. Call if any questions/conerns   Please start exercising regularly

## 2013-07-05 ENCOUNTER — Other Ambulatory Visit: Payer: Self-pay | Admitting: Family Medicine

## 2013-08-12 ENCOUNTER — Other Ambulatory Visit: Payer: Self-pay

## 2013-08-12 DIAGNOSIS — Z1231 Encounter for screening mammogram for malignant neoplasm of breast: Secondary | ICD-10-CM

## 2013-09-03 ENCOUNTER — Encounter (INDEPENDENT_AMBULATORY_CARE_PROVIDER_SITE_OTHER): Payer: Self-pay

## 2013-09-03 ENCOUNTER — Ambulatory Visit
Admission: RE | Admit: 2013-09-03 | Discharge: 2013-09-03 | Disposition: A | Discharge status: Home / Self Care | Payer: BC Managed Care – PPO | Source: Ambulatory Visit

## 2013-09-03 DIAGNOSIS — Z1231 Encounter for screening mammogram for malignant neoplasm of breast: Secondary | ICD-10-CM

## 2014-01-11 ENCOUNTER — Ambulatory Visit (INDEPENDENT_AMBULATORY_CARE_PROVIDER_SITE_OTHER): Payer: BC Managed Care – PPO

## 2014-01-11 DIAGNOSIS — Z23 Encounter for immunization: Secondary | ICD-10-CM

## 2014-02-04 ENCOUNTER — Other Ambulatory Visit: Payer: Self-pay | Admitting: Family Medicine

## 2014-03-08 ENCOUNTER — Ambulatory Visit (INDEPENDENT_AMBULATORY_CARE_PROVIDER_SITE_OTHER): Payer: BC Managed Care – PPO | Admitting: Family Medicine

## 2014-03-08 ENCOUNTER — Encounter: Payer: Self-pay | Admitting: Family Medicine

## 2014-03-08 VITALS — BP 134/90 | Ht 67.5 in | Wt 189.0 lb

## 2014-03-08 DIAGNOSIS — I10 Essential (primary) hypertension: Secondary | ICD-10-CM

## 2014-03-08 DIAGNOSIS — E781 Pure hyperglyceridemia: Secondary | ICD-10-CM

## 2014-03-08 DIAGNOSIS — E1121 Type 2 diabetes mellitus with diabetic nephropathy: Secondary | ICD-10-CM

## 2014-03-08 DIAGNOSIS — Z79899 Other long term (current) drug therapy: Secondary | ICD-10-CM

## 2014-03-08 DIAGNOSIS — E119 Type 2 diabetes mellitus without complications: Secondary | ICD-10-CM

## 2014-03-08 DIAGNOSIS — E785 Hyperlipidemia, unspecified: Secondary | ICD-10-CM

## 2014-03-08 LAB — POCT GLYCOSYLATED HEMOGLOBIN (HGB A1C): Hemoglobin A1C: 7.2

## 2014-03-08 MED ORDER — GLIPIZIDE 5 MG PO TABS: 5.0000 mg | ORAL_TABLET | ORAL | Status: DC

## 2014-03-08 MED ORDER — LISINOPRIL 2.5 MG PO TABS: 2.5000 mg | ORAL_TABLET | Freq: Every day | ORAL | Status: DC

## 2014-03-08 MED ORDER — INSULIN PEN NEEDLE 29G X 12.7MM MISC: Status: DC

## 2014-03-08 MED ORDER — METFORMIN HCL 1000 MG PO TABS: 1000.0000 mg | ORAL_TABLET | Freq: Two times a day (BID) | ORAL | Status: DC

## 2014-03-08 MED ORDER — INSULIN GLARGINE 100 UNIT/ML ~~LOC~~ SOLN: 22.0000 [IU] | Freq: Every day | SUBCUTANEOUS | Status: DC

## 2014-03-08 NOTE — Progress Notes (Signed)
   Subjective:    [name], female    DOB: 1971/03/06, 43 y.o.   MRN: [id number]  Diabetes She presents for her follow-up diabetic visit. She has type 2 diabetes mellitus. There are no hypoglycemic associated symptoms. There are no diabetic associated symptoms. Current diabetic treatment includes insulin injections and oral agent (dual therapy). She is compliant with treatment most of the time. She rarely participates in exercise. She monitors blood glucose at home 1-2 x per week. Her overall blood glucose range is 180-200 mg/dl. She does not see a podiatrist.Eye exam is not current.   Still in counselling for post divorce challenges  Morn glu overall are improving   Rare low sugar spells  Working harder on diet  Father recently passed away from renal failure  On lisiniopril glipizide and metformin  Taking insulin four out of seven days, goes to sleep      Patient has known hyperlipidemia. She stopped taking the fenofibrate on her own. Trying to watch diet mostly. Not exercising generally.  Compliant with blood pressure medication. No obvious side effects. Trying to watch salt intake. Review of Systems No headache no chest pain no back pain no abdominal pain no change in bowel habits no blood in stool    Objective:   Physical Exam  Alert vital stable. HEENT normal. Lungs clear. Heart regular rate and rhythm. Ankles without edema.      Assessment & Plan:  Impression 1 type 2 diabetes much improved control discussed #2 hypertension good control. #3 micro-proteinuria discussed #4 hyperlipidemia status uncertain. Plan diet exercise discussed in encourage. Appropriate blood work. Further recommendations based results. Check every 6 months. WSL

## 2014-03-15 LAB — HM DIABETES EYE EXAM

## 2014-03-21 ENCOUNTER — Other Ambulatory Visit: Payer: Self-pay | Admitting: Family Medicine

## 2014-04-07 LAB — LIPID PANEL
Cholesterol: 159 mg/dL (ref 0–200)
HDL: 39 mg/dL — ABNORMAL LOW (ref >39)
LDL Cholesterol: 81 mg/dL (ref 0–99)
Total CHOL/HDL Ratio: 4.1 Ratio
Triglycerides: 194 mg/dL — ABNORMAL HIGH (ref <150)
VLDL: 39 mg/dL (ref 0–40)

## 2014-04-07 LAB — HEPATIC FUNCTION PANEL
ALT: 21 U/L (ref 0–35)
AST: 14 U/L (ref 0–37)
Albumin: 3.9 g/dL (ref 3.5–5.2)
Alkaline Phosphatase: 72 U/L (ref 39–117)
Bilirubin, Direct: 0.1 mg/dL (ref 0.0–0.3)
Indirect Bilirubin: 0.4 mg/dL (ref 0.2–1.2)
Total Bilirubin: 0.5 mg/dL (ref 0.2–1.2)
Total Protein: 6.8 g/dL (ref 6.0–8.3)

## 2014-04-07 LAB — BASIC METABOLIC PANEL
BUN: 11 mg/dL (ref 6–23)
CO2: 25 mEq/L (ref 19–32)
Calcium: 8.7 mg/dL (ref 8.4–10.5)
Chloride: 99 mEq/L (ref 96–112)
Creat: 0.74 mg/dL (ref 0.50–1.10)
Glucose, Bld: 283 mg/dL — ABNORMAL HIGH (ref 70–99)
Potassium: 4.5 mEq/L (ref 3.5–5.3)
Sodium: 132 mEq/L — ABNORMAL LOW (ref 135–145)

## 2014-04-07 LAB — MICROALBUMIN, URINE: Microalb, Ur: 4.3 mg/dL — ABNORMAL HIGH (ref <2.0)

## 2014-04-10 ENCOUNTER — Encounter: Payer: Self-pay | Admitting: Family Medicine

## 2014-05-11 ENCOUNTER — Encounter: Payer: Self-pay | Admitting: Nurse Practitioner

## 2014-05-11 ENCOUNTER — Ambulatory Visit (INDEPENDENT_AMBULATORY_CARE_PROVIDER_SITE_OTHER): Payer: 59 | Admitting: Nurse Practitioner

## 2014-05-11 VITALS — BP 132/84 | Temp 98.6°F | Ht 67.5 in | Wt 191.0 lb

## 2014-05-11 DIAGNOSIS — K2970 Gastritis, unspecified, without bleeding: Secondary | ICD-10-CM | POA: Insufficient documentation

## 2014-05-11 MED ORDER — SUCRALFATE 1 GM/10ML PO SUSP: 1.0000 g | Freq: Three times a day (TID) | ORAL | Status: DC

## 2014-05-11 MED ORDER — PANTOPRAZOLE SODIUM 40 MG PO TBEC: 40.0000 mg | DELAYED_RELEASE_TABLET | Freq: Two times a day (BID) | ORAL | Status: DC

## 2014-05-11 NOTE — Progress Notes (Signed)
Subjective:  Presents complaints of localized epigastric area pain that is been constant, this is the fourth day. Difficulty describing the pain. Describes as a hard pain. No fever. No back pain. No nausea vomiting. No blood in her stools. No constipation or diarrhea. Stools normal color. Has not identified any specific triggers. Seems to be worse at nighttime. Has been under a lot of stress over the past few days. Drinks a large amount of caffeine. Denies any alcohol tobacco or NSAID use. Started a normal menstrual cycle this past weekend, having regular cycles with normal flow. Gets regular preventive health physicals. Has been off her birth control pills for a long time. Has been clearing her throat at times. No choking coughing or difficulty swallowing. No chest pain.  Objective:   BP 132/84 mmHg  Temp(Src) 98.6 F (37 C) (Oral)  Ht 5' 7.5" (1.715 m)  Wt 191 lb (86.637 kg)  BMI 29.46 kg/m2 NAD. Alert, oriented. TMs minimal clear effusion, no erythema. Pharynx clear. Neck supple with mild soft anterior adenopathy. Lungs clear. Heart regular rate rhythm. Abdomen obese soft nondistended with active bowel sounds 4; distinct moderate epigastric area tenderness. No obvious organomegaly. Abdominal exam otherwise benign.  Assessment: Gastritis  Plan:  Meds ordered this encounter  Medications  . pantoprazole (PROTONIX) 40 MG tablet    Sig: Take 1 tablet (40 mg total) by mouth 2 (two) times daily before a meal.    Dispense:  60 tablet    Refill:  2    Order Specific Question:  Supervising Provider    Answer:  Mikey Kirschner [[id number]]  . sucralfate (CARAFATE) 1 GM/10ML suspension    Sig: Take 10 mLs (1 g total) by mouth 4 (four) times daily -  with meals and at bedtime.    Dispense:  420 mL    Refill:  0    Order Specific Question:  Supervising Provider    Answer:  Maggie Font   Start with Protonix twice a day, reduce to once daily 1 symptoms have improved. Carafate as directed  when necessary. Call back next week if no improvement. Call back in 2-3 weeks if symptoms have not resolved. Given copy of dietary measures for GERD

## 2014-05-11 NOTE — Patient Instructions (Signed)

## 2014-08-08 ENCOUNTER — Other Ambulatory Visit: Payer: Self-pay

## 2014-08-08 DIAGNOSIS — Z1231 Encounter for screening mammogram for malignant neoplasm of breast: Secondary | ICD-10-CM

## 2014-08-26 ENCOUNTER — Ambulatory Visit (HOSPITAL_COMMUNITY)
Admission: RE | Admit: 2014-08-26 | Discharge: 2014-08-26 | Disposition: A | Discharge status: Home / Self Care | Payer: 59 | Source: Ambulatory Visit | Attending: Nurse Practitioner | Admitting: Nurse Practitioner

## 2014-08-26 ENCOUNTER — Ambulatory Visit (INDEPENDENT_AMBULATORY_CARE_PROVIDER_SITE_OTHER): Payer: 59 | Admitting: Nurse Practitioner

## 2014-08-26 ENCOUNTER — Encounter: Payer: Self-pay | Admitting: Nurse Practitioner

## 2014-08-26 VITALS — BP 130/82 | Temp 98.2°F | Ht 67.5 in | Wt 184.5 lb

## 2014-08-26 DIAGNOSIS — R109 Unspecified abdominal pain: Secondary | ICD-10-CM | POA: Insufficient documentation

## 2014-08-26 DIAGNOSIS — R1032 Left lower quadrant pain: Secondary | ICD-10-CM | POA: Diagnosis not present

## 2014-08-26 DIAGNOSIS — B372 Candidiasis of skin and nail: Secondary | ICD-10-CM

## 2014-08-26 DIAGNOSIS — K8020 Calculus of gallbladder without cholecystitis without obstruction: Secondary | ICD-10-CM | POA: Diagnosis not present

## 2014-08-26 DIAGNOSIS — K5900 Constipation, unspecified: Secondary | ICD-10-CM

## 2014-08-26 DIAGNOSIS — K648 Other hemorrhoids: Secondary | ICD-10-CM | POA: Diagnosis not present

## 2014-08-26 DIAGNOSIS — K644 Residual hemorrhoidal skin tags: Secondary | ICD-10-CM

## 2014-08-26 MED ORDER — KETOCONAZOLE 2 % EX CREA: 1.0000 "application " | TOPICAL_CREAM | Freq: Two times a day (BID) | CUTANEOUS | Status: DC

## 2014-08-26 NOTE — Patient Instructions (Signed)
Miralax or Dulcolax Fleets enema Magnesium citrate

## 2014-08-29 ENCOUNTER — Encounter: Payer: Self-pay | Admitting: Nurse Practitioner

## 2014-08-29 NOTE — Progress Notes (Signed)
Subjective:  Presents for complaints of constipation that began 5 days ago. Had almost 2 hours of bloating and pressure before she was finally able to pass stool after taking Mira lax and oat bran. Noticed some bright red bleeding in the toilet and with wiping after this. Has had small soft stools since then. Has taken 2 doses of milk of magnesia. Off-and-on bright red bleeding. No rectal pain itching or burning. Some external GU irritation, saw her gynecologist on Wednesday but never received any advice about this. No fever nausea or vomiting. Tends to have diarrhea. Some pressure in the left lower quadrant of the abdomen at times. No severe pain. States she feels "bloated". No family history of colon cancer.  Objective:   BP 130/82 mmHg  Temp(Src) 98.2 F (36.8 C) (Oral)  Ht 5' 7.5" (1.715 m)  Wt 184 lb 8 oz (83.689 kg)  BMI 28.45 kg/m2  LMP 08/03/2014 NAD. Alert, oriented. Lungs clear. Heart regular rate rhythm. Abdomen obese soft nondistended with active bowel sounds 4. Minimal tenderness noted in the left lower quadrant. No rebound or guarding. No obvious masses. External rectal area small resolving external hemorrhoid noted. Digital exam no masses, no stool for Hemoccult. Mild external shiny erythema at the GU area.  Assessment: Constipation, unspecified constipation type  Yeast dermatitis  External hemorrhoid  Abdominal pain, left lower quadrant - Plan: DG Abd 1 View  Plan:  Meds ordered this encounter  Medications  . ketoconazole (NIZORAL) 2 % cream    Sig: Apply 1 application topically 2 (two) times daily.    Dispense:  30 g    Refill:  0    Order Specific Question:  Supervising Provider    Answer:  Mikey Kirschner [[id number]]   Abdominal x-ray to assess amount of constipation. OTC topicals for hemorrhoid. Increase fiber in her diet. Restart Mira lax a daily until stools are soft and runny. Then cut back to maintenance dose. Warning signs reviewed. Call back if symptoms worsen or  persist.

## 2014-08-31 ENCOUNTER — Telehealth: Payer: Self-pay | Admitting: Nurse Practitioner

## 2014-08-31 NOTE — Telephone Encounter (Signed)
Patient was transferred to front desk to schedule appointment.  

## 2014-08-31 NOTE — Telephone Encounter (Signed)
Pt is wanting someone to call her back. She wants to talk to someone About the constipation she was seen for last week.

## 2014-09-01 ENCOUNTER — Ambulatory Visit (INDEPENDENT_AMBULATORY_CARE_PROVIDER_SITE_OTHER): Payer: 59 | Admitting: Nurse Practitioner

## 2014-09-01 ENCOUNTER — Encounter: Payer: Self-pay | Admitting: Nurse Practitioner

## 2014-09-01 VITALS — BP 102/80 | Temp 97.6°F | Ht 67.5 in | Wt 186.2 lb

## 2014-09-01 DIAGNOSIS — K589 Irritable bowel syndrome without diarrhea: Secondary | ICD-10-CM | POA: Diagnosis not present

## 2014-09-01 NOTE — Patient Instructions (Addendum)
2 cups of Activia yogurt or Align Benefiber as directed  Diet and Irritable Bowel Syndrome  No cure has been found for irritable bowel syndrome (IBS). Many options are available to treat the symptoms. Your caregiver will give you the best treatments available for your symptoms. He or she will also encourage you to manage stress and to make changes to your diet. You need to work with your caregiver and Registered Dietician to find the best combination of medicine, diet, counseling, and support to control your symptoms. The following are some diet suggestions. FOODS THAT MAKE IBS WORSE  Fatty foods, such as [place] fries.  Milk products, such as cheese or ice cream.  Chocolate.  Alcohol.  Caffeine (found in coffee and some sodas).  Carbonated drinks, such as soda. If certain foods cause symptoms, you should eat less of them or stop eating them. FOOD JOURNAL   Keep a journal of the foods that seem to cause distress. Write down:  What you are eating during the day and when.  What problems you are having after eating.  When the symptoms occur in relation to your meals.  What foods always make you feel badly.  Take your notes with you to your caregiver to see if you should stop eating certain foods. FOODS THAT MAKE IBS BETTER Fiber reduces IBS symptoms, especially constipation, because it makes stools soft, bulky, and easier to pass. Fiber is found in bran, bread, cereal, beans, fruit, and vegetables. Examples of foods with fiber include:  Apples.  Peaches.  Pears.  Berries.  Figs.  Broccoli, raw.  Cabbage.  Carrots.  Raw peas.  Kidney beans.  Lima beans.  Whole-grain bread.  Whole-grain cereal. Add foods with fiber to your diet a little at a time. This will let your body get used to them. Too much fiber at once might cause gas and swelling of your abdomen. This can trigger symptoms in a person with IBS. Caregivers usually recommend a diet with enough fiber to  produce soft, painless bowel movements. High fiber diets may cause gas and bloating. However, these symptoms often go away within a few weeks, as your body adjusts. In many cases, dietary fiber may lessen IBS symptoms, particularly constipation. However, it may not help pain or diarrhea. High fiber diets keep the colon mildly enlarged (distended) with the added fiber. This may help prevent spasms in the colon. Some forms of fiber also keep water in the stool, thereby preventing hard stools that are difficult to pass.  Besides telling you to eat more foods with fiber, your caregiver may also tell you to get more fiber by taking a fiber pill or drinking water mixed with a special high fiber powder. An example of this is a natural fiber laxative containing psyllium seed.  TIPS  Large meals can cause cramping and diarrhea in people with IBS. If this happens to you, try eating 4 or 5 small meals a day, or try eating less at each of your usual 3 meals. It may also help if your meals are low in fat and high in carbohydrates. Examples of carbohydrates are pasta, rice, whole-grain breads and cereals, fruits, and vegetables.  If dairy products cause your symptoms to flare up, you can try eating less of those foods. You might be able to handle yogurt better than other dairy products, because it contains bacteria that helps with digestion. Dairy products are an important source of calcium and other nutrients. If you need to avoid dairy products, be  sure to talk with a Registered Dietitian about getting these nutrients through other food sources.  Drink enough water and fluids to keep your urine clear or pale yellow. This is important, especially if you have diarrhea. FOR MORE INFORMATION  International Foundation for Functional Gastrointestinal Disorders: www.iffgd.org  National Digestive Diseases Information Clearinghouse: digestive.AmenCredit.is Document Released: 06/15/2003 Document Revised: 06/17/2011 Document  Reviewed: 06/25/2013 [facility] Patient Information 2015 [place], [place]. This information is not intended to replace advice given to you by your health care provider. Make sure you discuss any questions you have with your health care provider. Irritable Bowel Syndrome Irritable bowel syndrome (IBS) is caused by a disturbance of normal bowel function and is a common digestive disorder. You may also hear this condition called spastic colon, mucous colitis, and irritable colon. There is no cure for IBS. However, symptoms often gradually improve or disappear with a good diet, stress management, and medicine. This condition usually appears in late adolescence or early adulthood. Women develop it twice as often as men. CAUSES  After food has been digested and absorbed in the small intestine, waste material is moved into the large intestine, or colon. In the colon, water and salts are absorbed from the undigested products coming from the small intestine. The remaining residue, or fecal material, is held for elimination. Under normal circumstances, gentle, rhythmic contractions of the bowel walls push the fecal material along the colon toward the rectum. In IBS, however, these contractions are irregular and poorly coordinated. The fecal material is either retained too long, resulting in constipation, or expelled too soon, producing diarrhea. SIGNS AND SYMPTOMS  The most common symptom of IBS is abdominal pain. It is often in the lower left side of the abdomen, but it may occur anywhere in the abdomen. The pain comes from spasms of the bowel muscles happening too much and from the buildup of gas and fecal material in the colon. This pain:  Can range from sharp abdominal cramps to a dull, continuous ache.  Often worsens soon after eating.  Is often relieved by having a bowel movement or passing gas. Abdominal pain is usually accompanied by constipation, but it may also produce diarrhea. The diarrhea often occurs  right after a meal or upon waking up in the morning. The stools are often soft, watery, and flecked with mucus. Other symptoms of IBS include:  Bloating.  Loss of appetite.  Heartburn.  Backache.  Dull pain in the arms or shoulders.  Nausea.  Burping.  Vomiting.  Gas. IBS may also cause symptoms that are unrelated to the digestive system, such as:  Fatigue.  Headaches.  Anxiety.  Shortness of breath.  Trouble concentrating.  Dizziness. These symptoms tend to come and go. DIAGNOSIS  The symptoms of IBS may seem like symptoms of other, more serious digestive disorders. Your health care provider may want to perform tests to exclude these disorders.  TREATMENT Many medicines are available to help correct bowel function or relieve bowel spasms and abdominal pain. Among the medicines available are:  Laxatives for severe constipation and to help restore normal bowel habits.  Specific antidiarrheal medicines to treat severe or lasting diarrhea.  Antispasmodic agents to relieve intestinal cramps. Your health care provider may also decide to treat you with a mild tranquilizer or sedative during unusually stressful periods in your life. Your health care provider may also prescribe antidepressant medicine. The use of this medicine has been shown to reduce pain and other symptoms of IBS. Remember that if any medicine  is prescribed for you, you should take it exactly as directed. Make sure your health care provider knows how well it worked for you. HOME CARE INSTRUCTIONS   Take all medicines as directed by your health care provider.  Avoid foods that are high in fat or oils, such as heavy cream, butter, frankfurters, sausage, and other fatty meats.  Avoid foods that make you go to the bathroom, such as fruit, fruit juice, and dairy products.  Cut out carbonated drinks, chewing gum, and "gassy" foods such as beans and cabbage. This may help relieve bloating and burping.  Eat  foods with bran, and drink plenty of liquids with the bran foods. This helps relieve constipation.  Keep track of what foods seem to bring on your symptoms.  Avoid emotionally charged situations or circumstances that produce anxiety.  Start or continue exercising.  Get plenty of rest and sleep. Document Released: 03/25/2005 Document Revised: 03/30/2013 Document Reviewed: 11/13/2007 [facility] Patient Information 2015 [place], [place]. This information is not intended to replace advice given to you by your health care provider. Make sure you discuss any questions you have with your health care provider.

## 2014-09-04 ENCOUNTER — Encounter: Payer: Self-pay | Admitting: Nurse Practitioner

## 2014-09-04 NOTE — Progress Notes (Addendum)
Subjective:  Presents for recheck of abdominal pain. Feels bloated at times. Takes milk of magnesia double doses every night. Having loose stool every am. Feels urge to defecate at times but cannot. No fever. Blood in stool has improved. No further constipation.   Objective:   BP 102/80 mmHg  Temp(Src) 97.6 F (36.4 C) (Oral)  Ht 5' 7.5" (1.715 m)  Wt 186 lb 4 oz (84.482 kg)  BMI 28.72 kg/m2  LMP 08/03/2014 NAD. Alert, oriented. Lungs clear. Heart RRR. Abdomen soft, non distended, active BS; no masses or tenderness.  Abdominal xray 08/26/14 was normal. See previous note.  Assessment:  Problem List Items Addressed This Visit      Digestive   Irritable bowel syndrome - Primary     Plan: daily probiotics. Given information on IBS including dietary measures. Avoid use of Milk of Magnesia. Warning signs reviewed.  Return if symptoms worsen or fail to improve.

## 2014-09-07 ENCOUNTER — Ambulatory Visit
Admission: RE | Admit: 2014-09-07 | Discharge: 2014-09-07 | Disposition: A | Discharge status: Home / Self Care | Payer: 59 | Source: Ambulatory Visit

## 2014-09-07 DIAGNOSIS — Z1231 Encounter for screening mammogram for malignant neoplasm of breast: Secondary | ICD-10-CM

## 2014-11-05 ENCOUNTER — Other Ambulatory Visit: Payer: Self-pay | Admitting: Family Medicine

## 2014-11-07 NOTE — Telephone Encounter (Signed)
Needs office visit.

## 2014-11-10 ENCOUNTER — Other Ambulatory Visit: Payer: Self-pay | Admitting: *Deleted

## 2014-11-10 MED ORDER — INSULIN PEN NEEDLE 29G X 12.7MM MISC: Status: DC

## 2014-11-10 MED ORDER — GLIPIZIDE 5 MG PO TABS: 5.0000 mg | ORAL_TABLET | Freq: Every day | ORAL | Status: DC

## 2014-12-14 ENCOUNTER — Encounter: Payer: Self-pay | Admitting: Family Medicine

## 2014-12-14 ENCOUNTER — Ambulatory Visit (INDEPENDENT_AMBULATORY_CARE_PROVIDER_SITE_OTHER): Payer: 59 | Admitting: Family Medicine

## 2014-12-14 VITALS — BP 138/82 | Temp 98.4°F | Ht 67.5 in | Wt 195.2 lb

## 2014-12-14 DIAGNOSIS — I809 Phlebitis and thrombophlebitis of unspecified site: Secondary | ICD-10-CM

## 2014-12-14 MED ORDER — IBUPROFEN 800 MG PO TABS: 800.0000 mg | ORAL_TABLET | Freq: Three times a day (TID) | ORAL | Status: DC | PRN

## 2014-12-14 MED ORDER — AMOXICILLIN-POT CLAVULANATE 875-125 MG PO TABS: 1.0000 | ORAL_TABLET | Freq: Two times a day (BID) | ORAL | Status: DC

## 2014-12-14 NOTE — Progress Notes (Signed)
   Subjective:    [name], female    DOB: 23-Jun-1970, 44 y.o.   MRN: [id number]  HPI  Patient with c/o left leg pain, swelling and tenderness for 3 days-starts at thigh and a red line down leg.  Review of Systems She denies fever chills sweats she states that she has pain and tenderness along the lateral aspect of the leg. She states that her mother-in-law told her her leg was swollen but otherwise she doesn't know.    Objective:   Physical Exam The circumference of the calf and thigh are equal on both sides there is no tenderness in the back of the calf she does have tenderness along the lateral aspect where a swollen superficial vein is noted. She also has some redness associated with that pain plus also some redness where she is been doing injections of insulin into the top of her leg       Assessment & Plan:  Superficial thrombophlebitis along with some lymphadenitis I recommended this patient be treated with warm compresses frequently also ibuprofen 3 times a day for the next 5-7 days plus also antibiotics warning signs were discussed. I do not feel patient has DVT. I don't feel she needs to have ultrasound at this point. She is to be rechecked within 1 week's time. Warning signs were discussed if high fevers or worse follow-up sooner  The patient has an office visit tomorrow for other reasons but we can recheck the leg at that time plus also patient follow-up in one week for recheck as well

## 2014-12-15 ENCOUNTER — Encounter: Payer: Self-pay | Admitting: Nurse Practitioner

## 2014-12-15 ENCOUNTER — Ambulatory Visit (INDEPENDENT_AMBULATORY_CARE_PROVIDER_SITE_OTHER): Payer: 59 | Admitting: Nurse Practitioner

## 2014-12-15 VITALS — BP 118/86 | Ht 67.5 in | Wt 192.5 lb

## 2014-12-15 DIAGNOSIS — L309 Dermatitis, unspecified: Secondary | ICD-10-CM

## 2014-12-15 DIAGNOSIS — Z23 Encounter for immunization: Secondary | ICD-10-CM | POA: Diagnosis not present

## 2014-12-15 DIAGNOSIS — I10 Essential (primary) hypertension: Secondary | ICD-10-CM

## 2014-12-15 DIAGNOSIS — E781 Pure hyperglyceridemia: Secondary | ICD-10-CM

## 2014-12-15 DIAGNOSIS — E119 Type 2 diabetes mellitus without complications: Secondary | ICD-10-CM

## 2014-12-15 DIAGNOSIS — K589 Irritable bowel syndrome without diarrhea: Secondary | ICD-10-CM

## 2014-12-15 LAB — POCT GLYCOSYLATED HEMOGLOBIN (HGB A1C): Hemoglobin A1C: 9.2

## 2014-12-15 MED ORDER — GLIPIZIDE 5 MG PO TABS: 5.0000 mg | ORAL_TABLET | Freq: Every day | ORAL | Status: DC

## 2014-12-15 MED ORDER — INSULIN PEN NEEDLE 29G X 12.7MM MISC: Status: DC

## 2014-12-15 MED ORDER — INSULIN GLARGINE 100 UNIT/ML SOLOSTAR PEN: PEN_INJECTOR | SUBCUTANEOUS | Status: DC

## 2014-12-15 MED ORDER — METFORMIN HCL 1000 MG PO TABS: 1000.0000 mg | ORAL_TABLET | Freq: Two times a day (BID) | ORAL | Status: DC

## 2014-12-15 MED ORDER — TRIAMCINOLONE ACETONIDE 0.1 % EX CREA: 1.0000 "application " | TOPICAL_CREAM | Freq: Two times a day (BID) | CUTANEOUS | Status: DC

## 2014-12-15 MED ORDER — LISINOPRIL 2.5 MG PO TABS: 2.5000 mg | ORAL_TABLET | Freq: Every day | ORAL | Status: DC

## 2014-12-15 NOTE — Progress Notes (Signed)
Subjective:  Presents for her routine diabetes checkup. Has not been checking her sugars at home. Needs prescription for strips and lancets. Currently taking 42 units of Lantus at bedtime. Has not been taking her Protonix, using on a when necessary basis. Gets regular preventive health physicals. No chest pain/ischemic type pain or shortness of breath. Has had some issues with her IBS lately, no blood in her stool. No fevers. Also complaints of a small patch of dry skin at the border of the right nipple. Mildly pruritic. Has noticed for a few days.  Objective:   BP 118/86 mmHg  Ht 5' 7.5" (1.715 m)  Wt 192 lb 8 oz (87.317 kg)  BMI 29.69 kg/m2 NAD. Alert, oriented. Lungs clear. Heart regular rate rhythm. Abdomen soft nondistended nontender. Faint pink dry patch approximately 1 cm noted at the border of the nipple approximately 11:00 right breast. Results for orders placed or performed in visit on 12/15/14  POCT glycosylated hemoglobin (Hb A1C)  Result Value Ref Range   Hemoglobin A1C 9.2      Assessment:  Problem List Items Addressed This Visit      Cardiovascular and Mediastinum   Essential hypertension, benign - Primary   Relevant Medications   lisinopril (PRINIVIL,ZESTRIL) 2.5 MG tablet   Other Relevant Orders   Hepatic function panel   Basic metabolic panel     Digestive   Irritable bowel syndrome     Endocrine   RESOLVED: Type 2 diabetes mellitus   Relevant Medications   glipiZIDE (GLUCOTROL) 5 MG tablet   Insulin Glargine (LANTUS SOLOSTAR) 100 UNIT/ML Solostar Pen   lisinopril (PRINIVIL,ZESTRIL) 2.5 MG tablet   metFORMIN (GLUCOPHAGE) 1000 MG tablet   Other Relevant Orders   Microalbumin, urine   POCT glycosylated hemoglobin (Hb A1C) (Completed)     Other   Hypertriglyceridemia, essential   Relevant Medications   lisinopril (PRINIVIL,ZESTRIL) 2.5 MG tablet   Other Relevant Orders   Lipid panel    Other Visit Diagnoses    Encounter for immunization        Eczema  right breast         Plan:  Meds ordered this encounter  Medications  . norethindrone-ethinyl estradiol (JUNEL FE,GILDESS FE,LOESTRIN FE) 1-20 MG-MCG tablet    Sig: Take 1 tablet by mouth daily.  Marland Kitchen glipiZIDE (GLUCOTROL) 5 MG tablet    Sig: Take 1 tablet (5 mg total) by mouth daily.    Dispense:  30 tablet    Refill:  5    Order Specific Question:  Supervising Provider    Answer:  Mikey Kirschner [[id number]]  . Insulin Pen Needle (BD ULTRA-FINE PEN NEEDLES) 29G X 12.7MM MISC    Sig: USE AS DIRECTED    Dispense:  100 each    Refill:  5    Order Specific Question:  Supervising Provider    Answer:  Mikey Kirschner [[id number]]  . Insulin Glargine (LANTUS SOLOSTAR) 100 UNIT/ML Solostar Pen    Sig: INJECT 42 UNITS TOTAL INTO THE SKIN AT BEDTIME.    Dispense:  5 pen    Refill:  5    Order Specific Question:  Supervising Provider    Answer:  Mikey Kirschner [[id number]]  . lisinopril (PRINIVIL,ZESTRIL) 2.5 MG tablet    Sig: Take 1 tablet (2.5 mg total) by mouth daily.    Dispense:  30 tablet    Refill:  5    Order Specific Question:  Supervising Provider    Answer:  Mikey Kirschner [  [id number]]  . metFORMIN (GLUCOPHAGE) 1000 MG tablet    Sig: Take 1 tablet (1,000 mg total) by mouth 2 (two) times daily with a meal.    Dispense:  180 tablet    Refill:  1    Order Specific Question:  Supervising Provider    Answer:  Mikey Kirschner [[id number]]  . triamcinolone cream (KENALOG) 0.1 %    Sig: Apply 1 application topically 2 (two) times daily. Prn rash; use up to 2 weeks    Dispense:  30 g    Refill:  0    Order Specific Question:  Supervising Provider    Answer:  Mikey Kirschner [[id number]]   Patient's Lantus prescription had are a been sent in before her hemoglobin A1c results were available. Instructed patient to increase her Lantus to 50 units at bedtime, check sugars twice a day and send results to our office next week. Given prescription for strips and lancets. Triamcinolone twice a day to breast  area. Call back if rash persists or spreads. Discussed importance of weight loss particularly around the abdominal area. Routine labs due in December. Return in about 4 months (around 04/16/2015) for recheck.

## 2014-12-22 ENCOUNTER — Encounter: Payer: Self-pay | Admitting: Family Medicine

## 2014-12-22 ENCOUNTER — Other Ambulatory Visit: Payer: Self-pay

## 2014-12-22 ENCOUNTER — Other Ambulatory Visit (HOSPITAL_COMMUNITY)
Admission: RE | Admit: 2014-12-22 | Discharge: 2014-12-22 | Disposition: A | Discharge status: Home / Self Care | Payer: 59 | Source: Ambulatory Visit | Attending: Family Medicine | Admitting: Family Medicine

## 2014-12-22 ENCOUNTER — Ambulatory Visit (HOSPITAL_COMMUNITY): Payer: 59

## 2014-12-22 ENCOUNTER — Ambulatory Visit (INDEPENDENT_AMBULATORY_CARE_PROVIDER_SITE_OTHER): Payer: 59 | Admitting: Family Medicine

## 2014-12-22 ENCOUNTER — Ambulatory Visit (HOSPITAL_COMMUNITY)
Admission: RE | Admit: 2014-12-22 | Discharge: 2014-12-22 | Disposition: A | Discharge status: Home / Self Care | Payer: 59 | Source: Ambulatory Visit | Attending: Family Medicine | Admitting: Family Medicine

## 2014-12-22 VITALS — BP 134/92 | Ht 67.5 in | Wt 199.0 lb

## 2014-12-22 DIAGNOSIS — M79605 Pain in left leg: Secondary | ICD-10-CM | POA: Diagnosis not present

## 2014-12-22 DIAGNOSIS — I82812 Embolism and thrombosis of superficial veins of left lower extremities: Secondary | ICD-10-CM | POA: Insufficient documentation

## 2014-12-22 DIAGNOSIS — R7989 Other specified abnormal findings of blood chemistry: Secondary | ICD-10-CM

## 2014-12-22 DIAGNOSIS — I809 Phlebitis and thrombophlebitis of unspecified site: Secondary | ICD-10-CM

## 2014-12-22 DIAGNOSIS — R609 Edema, unspecified: Secondary | ICD-10-CM | POA: Insufficient documentation

## 2014-12-22 DIAGNOSIS — E119 Type 2 diabetes mellitus without complications: Secondary | ICD-10-CM | POA: Diagnosis not present

## 2014-12-22 LAB — D-DIMER, QUANTITATIVE (NOT AT ARMC): D-Dimer, Quant: 0.97 ug/mL-FEU — ABNORMAL HIGH (ref 0.00–0.48)

## 2014-12-22 NOTE — Progress Notes (Signed)
   Subjective:    [name], female    DOB: Aug 21, 1970, 44 y.o.   MRN: [id number]  HPIFollow up on left leg pain. Saw dr Nicki Reaper last week for leg pain. Pt taking ibuprofen and augmentin. Pt states pain is not any better. Patient states now pain is mainly the posterior calf. Very concerned about DVT. No chest pain no shortness of breath no lightheadedness no passing out.    Pt seen last week also for med check. A1C done last week. Adjustments were made in sugars. Also anxious about this. Sugars are reviewed and presence of patient. Pt brought in blood sugar readings. Next  Patient was diagnosed with superficial thrombophlebitis. Currently taking anti-inflammatory is in antibiotics  Review of Systems    no fever no headache no chest pain and back pain no abdominal pain Objective:   Physical Exam  Alert vitals stable anxious appearing patient lungs clear. Heart rare rhythm. Left leg one half centimeter enlarged on left calf. Superficial thrombophlebitis skin changes noted.  Glucose readings reviewed      Assessment & Plan:  Impression/plan #1 type 2 diabetes control improving/patient advised to maintain same dose #2 superficial thrombophlebitis/patient maintaining treatment #3 progression of leg pain with worry on part of patient., There is potential for extension. Plan sent for urgent d-dimer and ultrasound result. Addendum returned later today. Ultrasound reveals no DVT. However d-dimer is elevated. Will repeat an ultrasound in 1 week to be 562% certain. Maintain same therapy for now. We will arrange and discuss results with patient next week WSL

## 2014-12-30 ENCOUNTER — Ambulatory Visit (HOSPITAL_COMMUNITY)
Admission: RE | Admit: 2014-12-30 | Discharge: 2014-12-30 | Disposition: A | Discharge status: Home / Self Care | Payer: 59 | Source: Ambulatory Visit | Attending: Family Medicine | Admitting: Family Medicine

## 2014-12-30 DIAGNOSIS — M79605 Pain in left leg: Secondary | ICD-10-CM | POA: Diagnosis not present

## 2014-12-30 DIAGNOSIS — R791 Abnormal coagulation profile: Secondary | ICD-10-CM | POA: Diagnosis present

## 2014-12-30 DIAGNOSIS — R609 Edema, unspecified: Secondary | ICD-10-CM | POA: Insufficient documentation

## 2015-01-04 ENCOUNTER — Encounter: Payer: Self-pay | Admitting: Family Medicine

## 2015-01-04 ENCOUNTER — Ambulatory Visit (INDEPENDENT_AMBULATORY_CARE_PROVIDER_SITE_OTHER): Payer: 59 | Admitting: Family Medicine

## 2015-01-04 VITALS — BP 122/74 | Ht 67.5 in | Wt 194.0 lb

## 2015-01-04 DIAGNOSIS — E119 Type 2 diabetes mellitus without complications: Secondary | ICD-10-CM

## 2015-01-04 DIAGNOSIS — M79605 Pain in left leg: Secondary | ICD-10-CM

## 2015-01-04 DIAGNOSIS — H109 Unspecified conjunctivitis: Secondary | ICD-10-CM

## 2015-01-04 MED ORDER — GENTAMICIN SULFATE 0.3 % OP SOLN: OPHTHALMIC | Status: DC

## 2015-01-04 MED ORDER — TRAMADOL HCL 50 MG PO TABS: 50.0000 mg | ORAL_TABLET | Freq: Four times a day (QID) | ORAL | Status: DC | PRN

## 2015-01-04 NOTE — Progress Notes (Signed)
   Subjective:  Patient arrives office with multiple several concerns.    [name], female    DOB: 1971-03-17, 44 y.o.   MRN: [id number]  HPIleft leg pain. Was getting better. Started back hurting 4 days ago. Using heating pad. Finished antibiotic and pain med.  Sat the pain flared up. No otc meds.tylenol ibuprofen. Frustrated by ongoing pain. Primarily diffuse left lower leg.   Right eye drainage yesterday and had some pain in eye. None today. No headache no fever no cough no sore throat.  Pt brought in blood sugar readings. A1C 2 weeks ago was 9.2. Reports sugars improved but still suboptimal in discussed currently on Lantus. Claims fairly good compliance with meds.    Review of Systems No fever no chills no cough no chest pain no back pain no abdominal pain no shortness of breath    Objective:   Physical Exam  Alert vital stable blood pressure good HEENT right eye discharge slight nasal congestion slight inflammation I TMs normal pharynx normal neck supple lungs clear heart rare rhythm left Some pain to deep palpation residual hyperpigmentation at site of superficial thrombophlebitis        Assessment & Plan:  Impression 1 superficial thrombophlebitis with ongoing pain discussed common for this to occur #2 low-grade right eye discharge with tear duct involvement is discussed at length #3 type 2 diabetes control improving still suboptimal plan increase Lantus as noted. And I buttock eyedrops try local measures discussed. And uncomfortableness of her leg. Add tramadol when necessary. Expect slow resolution follow-up as scheduled WSL

## 2015-01-04 NOTE — Patient Instructions (Signed)
For the eye unilateral, antiiotic drops four times per day for five days  Three ibuorofen up to three times per day, we will rx tramadol 50 to add as needed  Increase lantus to sixty units, if after one month most of your fasting sugrs are not below 150 please call us

## 2015-04-09 HISTORY — PX: BREAST BIOPSY: SHX20

## 2015-04-14 LAB — MICROALBUMIN, URINE: Microalbumin, Urine: 48.4 ug/mL

## 2015-04-14 LAB — HEPATIC FUNCTION PANEL
ALT: 20 IU/L (ref 0–32)
AST: 20 IU/L (ref 0–40)
Albumin: 4.1 g/dL (ref 3.5–5.5)
Alkaline Phosphatase: 61 IU/L (ref 39–117)
Bilirubin Total: <0.2 mg/dL (ref 0.0–1.2)
Bilirubin, Direct: 0.03 mg/dL (ref 0.00–0.40)
Total Protein: 6.9 g/dL (ref 6.0–8.5)

## 2015-04-14 LAB — LIPID PANEL
Chol/HDL Ratio: 3.9 ratio units (ref 0.0–4.4)
Cholesterol, Total: 205 mg/dL — ABNORMAL HIGH (ref 100–199)
HDL: 52 mg/dL (ref >39)
LDL Calculated: 123 mg/dL — ABNORMAL HIGH (ref 0–99)
Triglycerides: 151 mg/dL — ABNORMAL HIGH (ref 0–149)
VLDL Cholesterol Cal: 30 mg/dL (ref 5–40)

## 2015-04-14 LAB — BASIC METABOLIC PANEL
BUN/Creatinine Ratio: 17 (ref 9–23)
BUN: 12 mg/dL (ref 6–24)
CO2: 17 mmol/L — ABNORMAL LOW (ref 18–29)
Calcium: 9.2 mg/dL (ref 8.7–10.2)
Chloride: 98 mmol/L (ref 96–106)
Creatinine, Ser: 0.71 mg/dL (ref 0.57–1.00)
GFR calc Af Amer: 119 mL/min/{1.73_m2} (ref >59)
GFR calc non Af Amer: 103 mL/min/{1.73_m2} (ref >59)
Glucose: 161 mg/dL — ABNORMAL HIGH (ref 65–99)
Potassium: 5.3 mmol/L — ABNORMAL HIGH (ref 3.5–5.2)
Sodium: 141 mmol/L (ref 134–144)

## 2015-04-17 ENCOUNTER — Ambulatory Visit (INDEPENDENT_AMBULATORY_CARE_PROVIDER_SITE_OTHER): Payer: BLUE CROSS/BLUE SHIELD | Admitting: Nurse Practitioner

## 2015-04-17 ENCOUNTER — Encounter: Payer: Self-pay | Admitting: Nurse Practitioner

## 2015-04-17 VITALS — BP 128/80 | Ht 67.5 in | Wt 195.0 lb

## 2015-04-17 DIAGNOSIS — M722 Plantar fascial fibromatosis: Secondary | ICD-10-CM | POA: Diagnosis not present

## 2015-04-17 DIAGNOSIS — E1121 Type 2 diabetes mellitus with diabetic nephropathy: Secondary | ICD-10-CM | POA: Diagnosis not present

## 2015-04-17 DIAGNOSIS — Z794 Long term (current) use of insulin: Secondary | ICD-10-CM | POA: Diagnosis not present

## 2015-04-17 DIAGNOSIS — E781 Pure hyperglyceridemia: Secondary | ICD-10-CM | POA: Diagnosis not present

## 2015-04-17 DIAGNOSIS — I10 Essential (primary) hypertension: Secondary | ICD-10-CM

## 2015-04-17 DIAGNOSIS — E119 Type 2 diabetes mellitus without complications: Secondary | ICD-10-CM | POA: Diagnosis not present

## 2015-04-17 LAB — POCT GLYCOSYLATED HEMOGLOBIN (HGB A1C): Hemoglobin A1C: 7.1

## 2015-04-17 NOTE — Patient Instructions (Addendum)
Phentermine Belviq Qsymia Contrave  Tea tree oil  Plantar Fasciitis Plantar fasciitis is a painful foot condition that affects the heel. It occurs when the band of tissue that connects the toes to the heel bone (plantar fascia) becomes irritated. This can happen after exercising too much or doing other repetitive activities (overuse injury). The pain from plantar fasciitis can range from mild irritation to severe pain that makes it difficult for you to walk or move. The pain is usually worse in the morning or after you have been sitting or lying down for a while. CAUSES This condition may be caused by:  Standing for long periods of time.  Wearing shoes that do not fit.  Doing high-impact activities, including running, aerobics, and ballet.  Being overweight.  Having an abnormal way of walking (gait).  Having tight calf muscles.  Having high arches in your feet.  Starting a new athletic activity. SYMPTOMS The main symptom of this condition is heel pain. Other symptoms include:  Pain that gets worse after activity or exercise.  Pain that is worse in the morning or after resting.  Pain that goes away after you walk for a few minutes. DIAGNOSIS This condition may be diagnosed based on your signs and symptoms. Your health care provider will also do a physical exam to check for:  A tender area on the bottom of your foot.  A high arch in your foot.  Pain when you move your foot.  Difficulty moving your foot. You may also need to have imaging studies to confirm the diagnosis. These can include:  X-rays.  Ultrasound.  MRI. TREATMENT  Treatment for plantar fasciitis depends on the severity of the condition. Your treatment may include:  Rest, ice, and over-the-counter pain medicines to manage your pain.  Exercises to stretch your calves and your plantar fascia.  A splint that holds your foot in a stretched, upward position while you sleep (night splint).  Physical  therapy to relieve symptoms and prevent problems in the future.  Cortisone injections to relieve severe pain.  Extracorporeal shock wave therapy (ESWT) to stimulate damaged plantar fascia with electrical impulses. It is often used as a last resort before surgery.  Surgery, if other treatments have not worked after 12 months. HOME CARE INSTRUCTIONS  Take medicines only as directed by your health care provider.  Avoid activities that cause pain.  Roll the bottom of your foot over a bag of ice or a bottle of cold water. Do this for 20 minutes, 3-4 times a day.  Perform simple stretches as directed by your health care provider.  Try wearing athletic shoes with air-sole or gel-sole cushions or soft shoe inserts.  Wear a night splint while sleeping, if directed by your health care provider.  Keep all follow-up appointments with your health care provider. PREVENTION   Do not perform exercises or activities that cause heel pain.  Consider finding low-impact activities if you continue to have problems.  Lose weight if you need to. The best way to prevent plantar fasciitis is to avoid the activities that aggravate your plantar fascia. SEEK MEDICAL CARE IF:  Your symptoms do not go away after treatment with home care measures.  Your pain gets worse.  Your pain affects your ability to move or do your daily activities.   This information is not intended to replace advice given to you by your health care provider. Make sure you discuss any questions you have with your health care provider.   Document  Released: 12/18/2000 Document Revised: 12/14/2014 Document Reviewed: 02/02/2014 Elsevier Interactive Patient Education [employer].

## 2015-04-17 NOTE — Progress Notes (Addendum)
Subjective:  Presents for routine follow up. No CP/ischemic type pain or SOB. Home glucose testing log shows almost all fasting sugars are below 200. Evening sugars are higher, averaging upper 100s/lower to mid 200s most of the time. Doing well with diet. Has consulted with diabetes management at [facility] in the past. Limited activity. Also c/o bilateral pain in the sole of the feet near the heel. Worse when she first gets up. Worse last week.   Objective:   BP 128/80 mmHg  Ht 5' 7.5" (1.715 m)  Wt 195 lb (88.451 kg)  BMI 30.07 kg/m2 NAD. Alert, oriented. Lungs clear. Heart RRR. WC 40.5 in. Results for orders placed or performed in visit on 04/17/15  POCT glycosylated hemoglobin (Hb A1C)  Result Value Ref Range   Hemoglobin A1C 7.1    Diabetic Foot Exam - Simple   Simple Foot Form  Diabetic Foot exam was performed with the following findings:  Yes 04/17/2015 11:40 AM  Visual Inspection  No deformities, no ulcerations, no other skin breakdown bilaterally:  Yes  Sensation Testing  Intact to touch and monofilament testing bilaterally:  Yes  Pulse Check  See comments:  Yes  Comments  Toes cool with normal cap refill. Slight yellow thickened nails on the first toes bilat. Multiple superficial varicosities on both ankles.      Reviewed labs with patient from 1/5. A1C today came down from 9.2 to 7.1.   Assessment:  Problem List Items Addressed This Visit      Cardiovascular and Mediastinum   Essential hypertension, benign     Endocrine   Type II diabetes mellitus with renal manifestations ([place])     Other   Hypertriglyceridemia, essential    Other Visit Diagnoses    Type 2 diabetes mellitus without complication, unspecified long term insulin use status (HCC)    -  Primary    Relevant Orders    POCT glycosylated hemoglobin (Hb A1C) (Completed)    Plantar fasciitis, bilateral          Plan:  Given written and verbal information on plantar fasciitis. Call back if persists. Discussed  diet and weight loss at length. Encouraged regular activity. Weight loss goal by next visit 5-10 lbs. Repeat A1C and lipid profile before next visit. No cholesterol med at this time. Will try natural tea tree oil on nails first for possible fungal infection. Also reduce high potassium foods in diet; level was just above normal. Not taking potassium supplements.  Return in about 4 months (around 08/15/2015) for diabetes checkup.

## 2015-06-17 ENCOUNTER — Other Ambulatory Visit: Payer: Self-pay | Admitting: Nurse Practitioner

## 2015-07-13 ENCOUNTER — Other Ambulatory Visit: Payer: Self-pay | Admitting: Nurse Practitioner

## 2015-07-27 ENCOUNTER — Encounter: Payer: Self-pay | Admitting: Nurse Practitioner

## 2015-07-27 ENCOUNTER — Ambulatory Visit (INDEPENDENT_AMBULATORY_CARE_PROVIDER_SITE_OTHER): Payer: BLUE CROSS/BLUE SHIELD | Admitting: Nurse Practitioner

## 2015-07-27 VITALS — BP 132/82 | Ht 67.5 in | Wt 194.5 lb

## 2015-07-27 DIAGNOSIS — J300 Vasomotor rhinitis: Secondary | ICD-10-CM

## 2015-07-27 DIAGNOSIS — N912 Amenorrhea, unspecified: Secondary | ICD-10-CM | POA: Diagnosis not present

## 2015-07-27 DIAGNOSIS — N951 Menopausal and female climacteric states: Secondary | ICD-10-CM

## 2015-07-27 LAB — POCT URINE PREGNANCY: Preg Test, Ur: NEGATIVE

## 2015-07-27 NOTE — Patient Instructions (Signed)
Claritin or Allegra Nasacort AQ as directed

## 2015-07-28 ENCOUNTER — Encounter: Payer: Self-pay | Admitting: Nurse Practitioner

## 2015-07-28 DIAGNOSIS — N951 Menopausal and female climacteric states: Secondary | ICD-10-CM | POA: Insufficient documentation

## 2015-07-28 NOTE — Progress Notes (Signed)
Subjective:  Presents to discuss her menstrual cycle. Currently on birth control pills prescribed by gynecology. Rare missed pills, none this past pack. Was having a regular cycle, did not have a menstrual cycle this last time. Has done 5 pregnancy test outside the office all which were negative. Has been with the same sexual partner for over a year. Also complaints of mild head congestion and drainage. No fever. Minimal cough. No ear pain and sore throat or headache.  Objective:   BP 132/82 mmHg  Ht 5' 7.5" (1.715 m)  Wt 194 lb 8 oz (88.225 kg)  BMI 30.00 kg/m2  LMP 05/29/2015 (Approximate) NAD. Alert, oriented. TMs clear effusion, no erythema. Pharynx injected with clear PND noted. Neck supple with mild soft anterior adenopathy. Lungs clear. Heart regular rate rhythm. Results for orders placed or performed in visit on 07/27/15  POCT urine pregnancy  Result Value Ref Range   Preg Test, Ur Negative Negative     Assessment:  Problem List Items Addressed This Visit      Other   Perimenopause    Other Visit Diagnoses    Amenorrhea    -  Primary    Relevant Orders    POCT urine pregnancy (Completed)    Vasomotor rhinitis          Plan: Lack of cycle is most likely due to perimenopause. OTC meds as directed for head congestion. Recommend follow-up with gynecology; due to her age and other diagnoses such as diabetes, recommend reconsidering use of estrogen. Discussed risk. Nonsmoker. Recheck here as needed.

## 2015-08-10 ENCOUNTER — Ambulatory Visit: Payer: BLUE CROSS/BLUE SHIELD | Admitting: Nurse Practitioner

## 2015-08-10 ENCOUNTER — Other Ambulatory Visit: Payer: Self-pay

## 2015-08-10 DIAGNOSIS — Z1231 Encounter for screening mammogram for malignant neoplasm of breast: Secondary | ICD-10-CM

## 2015-08-15 ENCOUNTER — Ambulatory Visit (INDEPENDENT_AMBULATORY_CARE_PROVIDER_SITE_OTHER): Payer: BLUE CROSS/BLUE SHIELD | Admitting: Nurse Practitioner

## 2015-08-15 ENCOUNTER — Encounter: Payer: Self-pay | Admitting: Nurse Practitioner

## 2015-08-15 VITALS — BP 128/80 | Ht 67.5 in | Wt 196.2 lb

## 2015-08-15 DIAGNOSIS — Z794 Long term (current) use of insulin: Secondary | ICD-10-CM | POA: Diagnosis not present

## 2015-08-15 DIAGNOSIS — I10 Essential (primary) hypertension: Secondary | ICD-10-CM

## 2015-08-15 DIAGNOSIS — E119 Type 2 diabetes mellitus without complications: Secondary | ICD-10-CM | POA: Diagnosis not present

## 2015-08-15 LAB — POCT GLYCOSYLATED HEMOGLOBIN (HGB A1C): Hemoglobin A1C: 7

## 2015-08-15 NOTE — Progress Notes (Signed)
Subjective:  Presents for routine follow-up on her diabetes. Needs updated eye exam, patient plans to get that sometime this year. Fasting sugars running on average 168-200+. At least 4 times has had readings over [id number] after eating certain foods. Has done fairly well with her diet. Rare hypoglycemic episodes, had one a few days ago her sugar was 67. Has begun attending diabetes classes at the wellness Center in [place] sponsored by the health department. Currently on 60 units of Lantus in the evening. No chest pain/ischemic type pain or shortness of breath. Has her preventive health physical and mammogram scheduled 6/7.  Objective:   BP 128/80 mmHg  Ht 5' 7.5" (1.715 m)  Wt 196 lb 4 oz (89.018 kg)  BMI 30.27 kg/m2  LMP 05/29/2015 (Approximate) NAD. Alert, oriented. Lungs clear. Heart regular rate rhythm. Results for orders placed or performed in visit on 08/15/15  POCT glycosylated hemoglobin (Hb A1C)  Result Value Ref Range   Hemoglobin A1C 7.0      Assessment:  Problem List Items Addressed This Visit      Cardiovascular and Mediastinum   Essential hypertension, benign    Other Visit Diagnoses    Type 2 diabetes mellitus without complication, with long-term current use of insulin ([place])    -  Primary    Relevant Orders    POCT glycosylated hemoglobin (Hb A1C) (Completed)      Plan: Continue current meds as directed. Our goals over the next 3 months include weight loss of 5 pounds and increase activity to 20 minutes 4 days per week. Return in about 3 months (around 11/15/2015). Patient to call for lab work before visit.

## 2015-09-13 ENCOUNTER — Ambulatory Visit
Admission: RE | Admit: 2015-09-13 | Discharge: 2015-09-13 | Disposition: A | Discharge status: Home / Self Care | Payer: BLUE CROSS/BLUE SHIELD | Source: Ambulatory Visit

## 2015-09-13 DIAGNOSIS — Z1231 Encounter for screening mammogram for malignant neoplasm of breast: Secondary | ICD-10-CM

## 2015-09-22 ENCOUNTER — Other Ambulatory Visit: Payer: Self-pay | Admitting: Obstetrics & Gynecology

## 2015-09-22 DIAGNOSIS — R928 Other abnormal and inconclusive findings on diagnostic imaging of breast: Secondary | ICD-10-CM

## 2015-10-02 ENCOUNTER — Other Ambulatory Visit: Payer: Self-pay | Admitting: Obstetrics & Gynecology

## 2015-10-02 ENCOUNTER — Ambulatory Visit
Admission: RE | Admit: 2015-10-02 | Discharge: 2015-10-02 | Disposition: A | Discharge status: Home / Self Care | Payer: BLUE CROSS/BLUE SHIELD | Source: Ambulatory Visit | Attending: Obstetrics & Gynecology | Admitting: Obstetrics & Gynecology

## 2015-10-02 DIAGNOSIS — R928 Other abnormal and inconclusive findings on diagnostic imaging of breast: Secondary | ICD-10-CM

## 2015-10-02 DIAGNOSIS — R921 Mammographic calcification found on diagnostic imaging of breast: Secondary | ICD-10-CM

## 2015-10-05 ENCOUNTER — Ambulatory Visit
Admission: RE | Admit: 2015-10-05 | Discharge: 2015-10-05 | Disposition: A | Discharge status: Home / Self Care | Payer: BLUE CROSS/BLUE SHIELD | Source: Ambulatory Visit | Attending: Obstetrics & Gynecology | Admitting: Obstetrics & Gynecology

## 2015-10-05 DIAGNOSIS — R921 Mammographic calcification found on diagnostic imaging of breast: Secondary | ICD-10-CM

## 2015-11-20 ENCOUNTER — Ambulatory Visit: Payer: BLUE CROSS/BLUE SHIELD | Admitting: Nurse Practitioner

## 2015-11-28 ENCOUNTER — Other Ambulatory Visit: Payer: Self-pay | Admitting: Nurse Practitioner

## 2015-12-15 ENCOUNTER — Encounter: Payer: Self-pay | Admitting: Nurse Practitioner

## 2015-12-15 ENCOUNTER — Ambulatory Visit (INDEPENDENT_AMBULATORY_CARE_PROVIDER_SITE_OTHER): Payer: BLUE CROSS/BLUE SHIELD | Admitting: Nurse Practitioner

## 2015-12-15 VITALS — BP 114/76 | Ht 67.5 in | Wt 193.1 lb

## 2015-12-15 DIAGNOSIS — Z794 Long term (current) use of insulin: Secondary | ICD-10-CM | POA: Diagnosis not present

## 2015-12-15 DIAGNOSIS — Z23 Encounter for immunization: Secondary | ICD-10-CM | POA: Diagnosis not present

## 2015-12-15 DIAGNOSIS — E119 Type 2 diabetes mellitus without complications: Secondary | ICD-10-CM

## 2015-12-15 LAB — POCT GLYCOSYLATED HEMOGLOBIN (HGB A1C): Hemoglobin A1C: 6.6

## 2015-12-15 NOTE — Progress Notes (Signed)
Subjective:  Presents for routine follow-up. Has noticed her nighttime blood sugars are little higher than usual mainly based on certain foods that she eats such as sugar or simple carbs. Blood sugars in the morning run 1:30-176. Has not had an eye exam in the past year. Just went through a left breast biopsy which was negative. Has a very strong family history of breast cancer. No chest pain/ischemic type pain or shortness of breath or edema. Minimal activity.  Objective:   BP 114/76   Ht 5' 7.5" (1.715 m)   Wt 193 lb 2 oz (87.6 kg)   BMI 29.80 kg/m  NAD. Alert, oriented. Lungs clear. Heart regular rate rhythm. Weight stable. Lower extremities no edema. Results for orders placed or performed in visit on 12/15/15  POCT HgB A1C  Result Value Ref Range   Hemoglobin A1C 6.6      Assessment:  Problem List Items Addressed This Visit      Endocrine   Type 2 diabetes mellitus without complication, with long-term current use of insulin ([place]) - Primary   Relevant Orders   POCT HgB A1C (Completed)    Other Visit Diagnoses    Need for immunization against influenza       Relevant Orders   Flu Vaccine QUAD 36+ mos IM (Completed)       Plan: Continue current regimen. Encouraged diet low in sugar and simple carbs. Encouraged regular walking program most days of the week. Flu vaccine today. Return in about 3 months (around 03/15/2016) for recheck.

## 2016-01-24 ENCOUNTER — Other Ambulatory Visit: Payer: Self-pay | Admitting: Nurse Practitioner

## 2016-02-23 ENCOUNTER — Telehealth: Payer: Self-pay | Admitting: Family Medicine

## 2016-02-23 ENCOUNTER — Other Ambulatory Visit: Payer: Self-pay

## 2016-02-23 MED ORDER — BD PEN NEEDLE SHORT U/F 31G X 8 MM MISC: 5 refills | Status: DC

## 2016-02-23 NOTE — Telephone Encounter (Signed)
Spoke with patient and informed her that refills were sent in. Patient verbalized understanding.  

## 2016-02-23 NOTE — Telephone Encounter (Signed)
Patient is needing Rx for B-D ULTRAFINE III SHORT PEN 31G X 8 MM MISC   [facility]

## 2016-03-04 ENCOUNTER — Telehealth: Payer: Self-pay | Admitting: Nurse Practitioner

## 2016-03-04 DIAGNOSIS — E119 Type 2 diabetes mellitus without complications: Secondary | ICD-10-CM

## 2016-03-04 DIAGNOSIS — Z79899 Other long term (current) drug therapy: Secondary | ICD-10-CM

## 2016-03-04 DIAGNOSIS — I10 Essential (primary) hypertension: Secondary | ICD-10-CM

## 2016-03-04 DIAGNOSIS — E785 Hyperlipidemia, unspecified: Secondary | ICD-10-CM

## 2016-03-04 MED ORDER — INSULIN GLARGINE 100 UNIT/ML SOLOSTAR PEN: 48.0000 [IU] | PEN_INJECTOR | Freq: Every day | SUBCUTANEOUS | 0 refills | Status: DC

## 2016-03-04 NOTE — Telephone Encounter (Signed)
Spoke with patient and informed her per Dr.Steve Mat[place] have been ordered, blood sugars are too high. Add 6 units of lantus to your current dose. Also unsweetened tea should not cause blood sugars to go up. Keep appointment for the 13th. Patient verbalized understanding.

## 2016-03-04 NOTE — Telephone Encounter (Signed)
Left message to return call to discuss message below. Lab orders put in and new dose of lantus sent to [facility]

## 2016-03-04 NOTE — Telephone Encounter (Signed)
Requesting order for blood work for upcoming appointment in December.   Also, her blood sugar has been running in the 200's lately.  She thinks its because she hasn't been drinking enough water and drinking too much un-sweet tea.  She wants to know if un-sweet tea could cause her sugar to go up and if she needs to be seen sooner than March 20, 2016?

## 2016-03-04 NOTE — Telephone Encounter (Signed)
Lip liv m7 A!c Albumine to creat urine ratio,  Numbers too high  Add six units lantus to current dose  No unsweeteend should not do this  Keep same appt

## 2016-03-08 LAB — BASIC METABOLIC PANEL
BUN/Creatinine Ratio: 17 (ref 9–23)
BUN: 13 mg/dL (ref 6–24)
CO2: 22 mmol/L (ref 18–29)
Calcium: 9.1 mg/dL (ref 8.7–10.2)
Chloride: 100 mmol/L (ref 96–106)
Creatinine, Ser: 0.77 mg/dL (ref 0.57–1.00)
GFR calc Af Amer: 107 mL/min/{1.73_m2} (ref >59)
GFR calc non Af Amer: 93 mL/min/{1.73_m2} (ref >59)
Glucose: 181 mg/dL — ABNORMAL HIGH (ref 65–99)
Potassium: 4.6 mmol/L (ref 3.5–5.2)
Sodium: 139 mmol/L (ref 134–144)

## 2016-03-08 LAB — HEMOGLOBIN A1C
Est. average glucose Bld gHb Est-mCnc: 163 mg/dL
Hgb A1c MFr Bld: 7.3 % — ABNORMAL HIGH (ref 4.8–5.6)

## 2016-03-08 LAB — LIPID PANEL
Chol/HDL Ratio: 3.9 ratio units (ref 0.0–4.4)
Cholesterol, Total: 158 mg/dL (ref 100–199)
HDL: 41 mg/dL (ref >39)
LDL Calculated: 82 mg/dL (ref 0–99)
Triglycerides: 174 mg/dL — ABNORMAL HIGH (ref 0–149)
VLDL Cholesterol Cal: 35 mg/dL (ref 5–40)

## 2016-03-08 LAB — MICROALBUMIN / CREATININE URINE RATIO
Creatinine, Urine: 265.8 mg/dL
Microalb/Creat Ratio: 18.6 mg/g creat (ref 0.0–30.0)
Microalbumin, Urine: 49.5 ug/mL

## 2016-03-08 LAB — HEPATIC FUNCTION PANEL
ALT: 17 IU/L (ref 0–32)
AST: 15 IU/L (ref 0–40)
Albumin: 4 g/dL (ref 3.5–5.5)
Alkaline Phosphatase: 59 IU/L (ref 39–117)
Bilirubin Total: 0.3 mg/dL (ref 0.0–1.2)
Bilirubin, Direct: 0.08 mg/dL (ref 0.00–0.40)
Total Protein: 6.9 g/dL (ref 6.0–8.5)

## 2016-03-14 ENCOUNTER — Other Ambulatory Visit: Payer: Self-pay | Admitting: Nurse Practitioner

## 2016-03-18 ENCOUNTER — Ambulatory Visit: Payer: BLUE CROSS/BLUE SHIELD | Admitting: Nurse Practitioner

## 2016-03-20 ENCOUNTER — Encounter: Payer: Self-pay | Admitting: Nurse Practitioner

## 2016-03-20 ENCOUNTER — Encounter: Payer: Self-pay | Admitting: Family Medicine

## 2016-03-20 ENCOUNTER — Ambulatory Visit (INDEPENDENT_AMBULATORY_CARE_PROVIDER_SITE_OTHER): Payer: BLUE CROSS/BLUE SHIELD | Admitting: Nurse Practitioner

## 2016-03-20 VITALS — BP 128/82 | Ht 67.5 in | Wt 194.6 lb

## 2016-03-20 DIAGNOSIS — K1370 Unspecified lesions of oral mucosa: Secondary | ICD-10-CM | POA: Diagnosis not present

## 2016-03-20 DIAGNOSIS — Z794 Long term (current) use of insulin: Secondary | ICD-10-CM | POA: Diagnosis not present

## 2016-03-20 DIAGNOSIS — K219 Gastro-esophageal reflux disease without esophagitis: Secondary | ICD-10-CM | POA: Diagnosis not present

## 2016-03-20 DIAGNOSIS — K580 Irritable bowel syndrome with diarrhea: Secondary | ICD-10-CM | POA: Diagnosis not present

## 2016-03-20 DIAGNOSIS — I10 Essential (primary) hypertension: Secondary | ICD-10-CM

## 2016-03-20 DIAGNOSIS — E119 Type 2 diabetes mellitus without complications: Secondary | ICD-10-CM

## 2016-03-20 MED ORDER — TRIAMCINOLONE ACETONIDE 0.1 % MT PSTE: 1.0000 "application " | PASTE | Freq: Two times a day (BID) | OROMUCOSAL | 0 refills | Status: DC

## 2016-03-20 MED ORDER — RANITIDINE HCL 150 MG PO CAPS: 150.0000 mg | ORAL_CAPSULE | Freq: Two times a day (BID) | ORAL | 5 refills | Status: DC

## 2016-03-20 NOTE — Patient Instructions (Signed)
Food Choices for Gastroesophageal Reflux Disease, Adult When you have gastroesophageal reflux disease (GERD), the foods you eat and your eating habits are very important. Choosing the right foods can help ease the discomfort of GERD. What general guidelines do I need to follow?  Choose fruits, vegetables, whole grains, low-fat dairy products, and low-fat meat, fish, and poultry.  Limit fats such as oils, salad dressings, butter, nuts, and avocado.  Keep a food diary to identify foods that cause symptoms.  Avoid foods that cause reflux. These may be different for different people.  Eat frequent small meals instead of three large meals each day.  Eat your meals slowly, in a relaxed setting.  Limit fried foods.  Cook foods using methods other than frying.  Avoid drinking alcohol.  Avoid drinking large amounts of liquids with your meals.  Avoid bending over or lying down until 2-3 hours after eating. What foods are not recommended? The following are some foods and drinks that may worsen your symptoms: Vegetables  Tomatoes. Tomato juice. Tomato and spaghetti sauce. Chili peppers. Onion and garlic. Horseradish. Fruits  Oranges, grapefruit, and lemon (fruit and juice). Meats  High-fat meats, fish, and poultry. This includes hot dogs, ribs, ham, sausage, salami, and bacon. Dairy  Whole milk and chocolate milk. Sour cream. Cream. Butter. Ice cream. Cream cheese. Beverages  Coffee and tea, with or without caffeine. Carbonated beverages or energy drinks. Condiments  Hot sauce. Barbecue sauce. Sweets/Desserts  Chocolate and cocoa. Donuts. Peppermint and spearmint. Fats and Oils  High-fat foods, including [place] fries and potato chips. Other  Vinegar. Strong spices, such as black pepper, white pepper, red pepper, cayenne, curry powder, cloves, ginger, and chili powder. The items listed above may not be a complete list of foods and beverages to avoid. Contact your dietitian for more  information.  This information is not intended to replace advice given to you by your health care provider. Make sure you discuss any questions you have with your health care provider. Document Released: 03/25/2005 Document Revised: 08/31/2015 Document Reviewed: 01/27/2013 Elsevier Interactive Patient Education  2017 [employer].

## 2016-03-20 NOTE — Progress Notes (Signed)
Subjective:  Presents for multiple issues. Sugar has been running as high as 200 nighttime. Was 172 this morning. Also experiences a rare hypoglycemic episode. Taking metformin twice a day. Has been having frequent loose stools. No blood. Minimal abdominal cramping. No acid reflux or heartburn. Taking glipizide 5 mg half tab by mouth twice a day. Cannot go up on the dose due to side effects. Has been under tremendous stress. Has a history of irritable bowel. In addition has a slight rash in her mouth just beneath the front teeth has been evaluated by her dentist, states she may have a papilloma. Nonsmoker. Denies any oral tobacco use. Has only been there for a few weeks. Mildly tender at times. Dentist has recommended a biopsy of the area but this caused over $400.  Objective:   BP 128/82   Ht 5' 7.5" (1.715 m)   Wt 194 lb 9.6 oz (88.3 kg)   BMI 30.03 kg/m  NAD. Alert, oriented. Lungs clear. Heart regular rate rhythm. Abdomen soft mildly obese nondistended with mild epigastric area tenderness. Otherwise benign. No obvious masses, exam limited due to abdominal girth. No rebound or guarding. Reviewed labs with patient dated 03/07/2016 area. Faint nonraised cluster of white lesions noted on the attached gingiva just beneath the front teeth. No significant erythema.  Assessment:  Problem List Items Addressed This Visit      Cardiovascular and Mediastinum   Essential hypertension, benign - Primary     Digestive   Irritable bowel syndrome   Relevant Medications   ranitidine (ZANTAC) 150 MG capsule     Endocrine   Type 2 diabetes mellitus without complication, with long-term current use of insulin (HCC)   Relevant Orders   Ambulatory referral to diabetic education    Other Visit Diagnoses    Gastroesophageal reflux disease without esophagitis       Relevant Medications   ranitidine (ZANTAC) 150 MG capsule   Oral lesion         Plan:  Meds ordered this encounter  Medications  .  MICROGESTIN 1-20 MG-MCG tablet    Sig: Take 1 tablet by mouth daily.    Refill:  3  . ranitidine (ZANTAC) 150 MG capsule    Sig: Take 1 capsule (150 mg total) by mouth 2 (two) times daily.    Dispense:  60 capsule    Refill:  5    Order Specific Question:   Supervising Provider    Answer:   Mikey Kirschner [[id number]]  . triamcinolone (KENALOG) 0.1 % paste    Sig: Use as directed 1 application in the mouth or throat 2 (two) times daily. Apply a small amount to lesions in the mouth BID up to 7-10 days    Dispense:  5 g    Refill:  0    Order Specific Question:   Supervising Provider    Answer:   Mikey Kirschner [[id number]]   Hold on morning dose of metformin, continue evening dose. Due this for 2 weeks. See if diarrhea will improve over that time. Add Zantac to regimen. Decrease intake of tea. Although this is sugar free, it does have caffeine. Trial of triamcinolone to rash in mouth for up to 10 days. Stop at this point. If rash has not resolved, recheck with dentist for biopsy. Refer to dietitian for help with diabetic diet. At this point continue other medications as directed. May need to make adjustments or add medication at next visit. Encouraged patient to get a yearly eye  exam.Return in about 3 months (around 06/18/2016) for recheck. Call back sooner if any problems.

## 2016-04-16 ENCOUNTER — Other Ambulatory Visit: Payer: Self-pay | Admitting: Family Medicine

## 2016-04-29 ENCOUNTER — Encounter: Payer: BLUE CROSS/BLUE SHIELD | Attending: Nurse Practitioner | Admitting: Nutrition

## 2016-04-29 ENCOUNTER — Encounter: Payer: Self-pay | Admitting: Nutrition

## 2016-04-29 VITALS — Ht 67.5 in | Wt 199.0 lb

## 2016-04-29 DIAGNOSIS — Z713 Dietary counseling and surveillance: Secondary | ICD-10-CM | POA: Diagnosis not present

## 2016-04-29 DIAGNOSIS — E119 Type 2 diabetes mellitus without complications: Secondary | ICD-10-CM | POA: Insufficient documentation

## 2016-04-29 DIAGNOSIS — Z794 Long term (current) use of insulin: Secondary | ICD-10-CM | POA: Insufficient documentation

## 2016-04-29 DIAGNOSIS — Z6830 Body mass index (BMI) 30.0-30.9, adult: Secondary | ICD-10-CM | POA: Diagnosis not present

## 2016-04-29 DIAGNOSIS — E669 Obesity, unspecified: Secondary | ICD-10-CM

## 2016-04-29 DIAGNOSIS — E118 Type 2 diabetes mellitus with unspecified complications: Secondary | ICD-10-CM

## 2016-04-29 NOTE — Progress Notes (Signed)
Diabetes Self-Management Education  Visit Type: First/Initial  Appt. Start Time: 1100Appt. End Time:  1230  04/29/2016  [name], identified by name and date of birth, is a 46 y.o. female with a diagnosis of Diabetes: Type 2.  She is a single mom who has a 78 yr old daughter. She does not work. Eats irregularly 2-3 meals per day. Has a lot of stress she reports and this effects her routine and timing of meals. Takes 66 units of Lantus per day and on 5 mg of Glipizide BID and 1000 mg of Metformin BID. Complains of diarrhea. Has been taking Metformin before she eats instead of after. Gives insulin in her legs instead of abdomen. Doesn't exercise. Reports signs of hyperglycemia of increased thirsty, dry, itchy skin, fatigue and increased appetite at times and blurry vision occasionally.   Diet is inconsistent to meet her needs, low in fresh fruits, vegetables and whole grains and inadequate to control blood sugars.   She does mention she sometimes forgets her evening dose of Lantus if she falls asleep early.    ASSESSMENT  Height 5' 7.5" (1.715 m), weight 199 lb (90.3 kg). Body mass index is 30.71 kg/m.      Diabetes Self-Management Education - 04/29/16 1109      Visit Information   Visit Type First/Initial     Initial Visit   Diabetes Type Type 1   Are you currently following a meal plan? No   Are you taking your medications as prescribed? Yes   Date Diagnosed 2012     Health Coping   How would you rate your overall health? Good     Psychosocial Assessment   Patient Belief/Attitude about Diabetes Motivated to manage diabetes   Self-care barriers None   Self-management support Family   Other persons present Patient   Patient Concerns Nutrition/Meal planning;Medication;Monitoring;Healthy Lifestyle;Problem Solving;Glycemic Control;Weight Control   Special Needs None   Preferred Learning Style No preference indicated   Learning Readiness Not Ready   How often do you  need to have someone help you when you read instructions, pamphlets, or other written materials from your doctor or pharmacy? 1 - Never   What is the last grade level you completed in school? 14     Pre-Education Assessment   Patient understands the diabetes disease and treatment process. Needs Review   Patient understands incorporating nutritional management into lifestyle. Needs Review   Patient undertands incorporating physical activity into lifestyle. Needs Review   Patient understands using medications safely. Needs Review   Patient understands monitoring blood glucose, interpreting and using results Needs Review   Patient understands prevention, detection, and treatment of acute complications. Needs Review   Patient understands prevention, detection, and treatment of chronic complications. Needs Review   Patient understands how to develop strategies to address psychosocial issues. Needs Review   Patient understands how to develop strategies to promote health/change behavior. Needs Review     Complications   Last HgB A1C per patient/outside source 7.5 %   How often do you check your blood sugar? 1-2 times/day   Fasting Blood glucose range (mg/dL) 130-179   Postprandial Blood glucose range (mg/dL) 180-200   Number of hypoglycemic episodes per month 0   Number of hyperglycemic episodes per week 15   Can you tell when your blood sugar is high? Yes   What do you do if your blood sugar is high? water   Have you had a dilated eye exam in the past 12  months? No   Have you had a dental exam in the past 12 months? No   Are you checking your feet? No     Dietary Intake   Breakfast Egg and cheese biscuit, unsweet tea OR Yogurt Parfait from [employer]: grits, eggs or cereal-granola with yogurt or special K, skim milk   Lunch  Hot dogs -2, fries, and Unsweet tea; salads or sandwich,    Dinner Macaroni and cheese with broccoli and pimento cheese on sandwich thins,  unsweet tea   Beverage(s)  water,     Exercise   Exercise Type ADL's   How many days per week to you exercise? 0     Patient Education   Disease state  Definition of diabetes, type 1 and 2, and the diagnosis of diabetes;Explored patient's options for treatment of their diabetes   Nutrition management  Role of diet in the treatment of diabetes and the relationship between the three main macronutrients and blood glucose level;Food label reading, portion sizes and measuring food.;Carbohydrate counting;Meal timing in regards to the patients' current diabetes medication.;Information on hints to eating out and maintain blood glucose control.   Physical activity and exercise  Identified with patient nutritional and/or medication changes necessary with exercise.;Helped patient identify appropriate exercises in relation to his/her diabetes, diabetes complications and other health issue.;Role of exercise on diabetes management, blood pressure control and cardiac health.   Medications Taught/reviewed insulin injection, site rotation, insulin storage and needle disposal.;Reviewed patients medication for diabetes, action, purpose, timing of dose and side effects.;Reviewed medication adjustment guidelines for hyperglycemia and sick days.   Monitoring Taught/evaluated SMBG meter.;Purpose and frequency of SMBG.;Interpreting lab values - A1C, lipid, urine microalbumina.;Taught/discussed recording of test results and interpretation of SMBG.;Identified appropriate SMBG and/or A1C goals.;Daily foot exams;Yearly dilated eye exam   Acute complications Taught treatment of hypoglycemia - the 15 rule.;Discussed and identified patients' treatment of hyperglycemia.   Chronic complications Relationship between chronic complications and blood glucose control;Assessed and discussed foot care and prevention of foot problems;Lipid levels, blood glucose control and heart disease;Identified and discussed with patient  current chronic complications;Dental  care;Retinopathy and reason for yearly dilated eye exams   Psychosocial adjustment Worked with patient to identify barriers to care and solutions;Role of stress on diabetes;Helped patient identify a support system for diabetes management;Travel strategies;Identified and addressed patients feelings and concerns about diabetes;Brainstormed with patient on coping mechanisms for social situations, getting support from significant others, dealing with feelings about diabetes   Preconception care Pregnancy and GDM  Role of pre-pregnancy blood glucose control on the development of the fetus;Reviewed with patient blood glucose goals with pregnancy;Role of family planning for patients with diabetes   Personal strategies to promote health Lifestyle issues that need to be addressed for better diabetes care;Helped patient develop diabetes management plan for (enter comment)     Individualized Goals (developed by patient)   Nutrition General guidelines for healthy choices and portions discussed;Follow meal plan discussed;Adjust meds/carbs with exercise as discussed   Physical Activity Exercise 3-5 times per week;30 minutes per day   Medications take my medication as prescribed   Monitoring  test my blood glucose as discussed;send in my blood glucose log as discussed;test blood glucose pre and post meals as discussed   Reducing Risk examine blood glucose patterns;get labs drawn;do foot checks daily;treat hypoglycemia with 15 grams of carbs if blood glucose less than 70mg /dL;increase portions of healthy fats     Post-Education Assessment   Patient understands the diabetes disease and treatment process.  Needs Review   Patient understands incorporating nutritional management into lifestyle. Needs Review   Patient undertands incorporating physical activity into lifestyle. Needs Review   Patient understands using medications safely. Needs Review   Patient understands monitoring blood glucose, interpreting and using  results Needs Review   Patient understands prevention, detection, and treatment of acute complications. Needs Review   Patient understands prevention, detection, and treatment of chronic complications. Needs Review   Patient understands how to develop strategies to address psychosocial issues. Needs Review   Patient understands how to develop strategies to promote health/change behavior. Needs Review     Outcomes   Expected Outcomes Demonstrated interest in learning. Expect positive outcomes   Future DMSE --  1 week   Program Status Completed      Individualized Plan for Diabetes Self-Management Training:   Learning Objective:  Patient will have a greater understanding of diabetes self-management. Patient education plan is to attend individual and/or group sessions per assessed needs and concerns.   Plan:   Patient Instructions  Goals 1. Follow My Plate 2. Eat meals on time 3. Increase fresh fruits and vegetables. 4. Check blood sugars betfore meals and bed time x 1 week and bring readings at next visit. 5. Drink water or unsweet tea with no sweeteners    Expected Outcomes:  Demonstrated interest in learning. Expect positive outcomes  Education material provided: Living Well with Diabetes, Food label handouts, A1C conversion sheet, Meal plan card, My Plate and Carbohydrate counting sheet  If problems or questions, patient to contact team via:  Phone and Email  Future DSME appointment:  (1 week). Will evaluate BS reading before meals to determine if she needs meal time insulin. BS have been in upper 200's lately.

## 2016-04-29 NOTE — Patient Instructions (Signed)
Goals 1. Follow My Plate 2. Eat meals on time 3. Increase fresh fruits and vegetables. 4. Check blood sugars betfore meals and bed time x 1 week and bring readings at next visit. 5. Drink water or unsweet tea with no sweeteners

## 2016-05-01 ENCOUNTER — Other Ambulatory Visit: Payer: Self-pay | Admitting: Family Medicine

## 2016-05-08 ENCOUNTER — Encounter: Payer: BLUE CROSS/BLUE SHIELD | Attending: Family Medicine | Admitting: Nutrition

## 2016-05-08 VITALS — Wt 198.0 lb

## 2016-05-08 DIAGNOSIS — Z6830 Body mass index (BMI) 30.0-30.9, adult: Secondary | ICD-10-CM | POA: Insufficient documentation

## 2016-05-08 DIAGNOSIS — E119 Type 2 diabetes mellitus without complications: Secondary | ICD-10-CM | POA: Diagnosis present

## 2016-05-08 DIAGNOSIS — E669 Obesity, unspecified: Secondary | ICD-10-CM

## 2016-05-08 DIAGNOSIS — Z713 Dietary counseling and surveillance: Secondary | ICD-10-CM | POA: Diagnosis not present

## 2016-05-08 DIAGNOSIS — Z794 Long term (current) use of insulin: Secondary | ICD-10-CM | POA: Diagnosis not present

## 2016-05-08 DIAGNOSIS — E118 Type 2 diabetes mellitus with unspecified complications: Secondary | ICD-10-CM

## 2016-05-08 NOTE — Patient Instructions (Signed)
Goals 1. Dirnk more water 2. Continue to increase fresh fruits and vegetables 3. Eat more baked and broiled foods and less fast foods 4. Watch portions 5. Cut out sodas Walk 15 minutes most days of week

## 2016-05-08 NOTE — Progress Notes (Signed)
Diabetes Self-Management Education  Visit Type:    Appt. Start Time: 1100  Appt. End Time:  1230  05/08/2016  Tara Bryant, identified by name and date of birth, is a 46 y.o. female with a diagnosis of Diabetes: Type 2.  Lost 1 lb.  BS readings are better since she has been trying to eat meals on time, better portion control and avoiding snacks. FBS last week 82-150 mg/dl and before lunch 88-212 mg/dl and before dinner 112-185 mg/dl and bedtime  174-277 mg/dl. Takes 66 units of Lantus, Glipizide and Metformin daily.     Has been better about not forgetting her insulin. Had a 88 BS yesterday before lunch. Feels like she has to eat sometimes to keep BS from dropping.   Would benefit from stopping Glipizide to reduce potential low blood sugars, weight gain and increased appetite.            14 day avg 153 mg/dl B) Almond special K,  Peaches 1/2 c, water L) CHicken alfredo with cheese breadstick,  DInner: Cheeseburger and ater Metformin 1000 MG BID Lantus 66. Units at night. Glipizide 2.5 mg BID.    Wt Readings from Last 3 Encounters:  05/08/16 198 lb (89.8 kg)  04/29/16 199 lb (90.3 kg)  03/20/16 194 lb 9.6 oz (88.3 kg)   Ht Readings from Last 3 Encounters:  04/29/16 5' 7.5" (1.715 m)  03/20/16 5' 7.5" (1.715 m)  12/15/15 5' 7.5" (1.715 m)   Body mass index is 30.55 kg/m.    ASSESSMENT  There were no vitals taken for this visit. There is no height or weight on file to calculate BMI.      Diabetes Self-Management Education - 04/29/16 1109      Visit Information   Visit Type First/Initial     Initial Visit   Diabetes Type Type 2   Are you currently following a meal plan? No   Are you taking your medications as prescribed? Yes   Date Diagnosed 2012     Health Coping   How would you rate your overall health? Good     Psychosocial Assessment   Patient Belief/Attitude about Diabetes Motivated to manage diabetes   Self-care barriers None   Self-management  support Family   Other persons present Patient   Patient Concerns Nutrition/Meal planning;Medication;Monitoring;Healthy Lifestyle;Problem Solving;Glycemic Control;Weight Control   Special Needs None   Preferred Learning Style No preference indicated   Learning Readiness Not Ready   How often do you need to have someone help you when you read instructions, pamphlets, or other written materials from your doctor or pharmacy? 1 - Never   What is the last grade level you completed in school? 14     Pre-Education Assessment   Patient understands the diabetes disease and treatment process. Needs Review   Patient understands incorporating nutritional management into lifestyle. Needs Review   Patient undertands incorporating physical activity into lifestyle. Needs Review   Patient understands using medications safely. Needs Review   Patient understands monitoring blood glucose, interpreting and using results Needs Review   Patient understands prevention, detection, and treatment of acute complications. Needs Review   Patient understands prevention, detection, and treatment of chronic complications. Needs Review   Patient understands how to develop strategies to address psychosocial issues. Needs Review   Patient understands how to develop strategies to promote health/change behavior. Needs Review     Complications   Last HgB A1C per patient/outside source 7.5 %   How often do you  check your blood sugar? 1-2 times/day   Fasting Blood glucose range (mg/dL) 130-179   Postprandial Blood glucose range (mg/dL) 180-200   Number of hypoglycemic episodes per month 0   Number of hyperglycemic episodes per week 15   Can you tell when your blood sugar is high? Yes   What do you do if your blood sugar is high? water   Have you had a dilated eye exam in the past 12 months? No   Have you had a dental exam in the past 12 months? No   Are you checking your feet? No     Dietary Intake   Breakfast Egg and  cheese biscuit, unsweet tea OR Yogurt Parfait from [employer]: grits, eggs or cereal-granola with yogurt or special K, skim milk   Lunch  Hot dogs -2, fries, and Unsweet tea; salads or sandwich,    Dinner Macaroni and cheese with broccoli and pimento cheese on sandwich thins,  unsweet tea   Beverage(s) water,     Exercise   Exercise Type ADL's   How many days per week to you exercise? 0     Patient Education   Disease state  Definition of diabetes, type 1 and 2, and the diagnosis of diabetes;Explored patient's options for treatment of their diabetes   Nutrition management  Role of diet in the treatment of diabetes and the relationship between the three main macronutrients and blood glucose level;Food label reading, portion sizes and measuring food.;Carbohydrate counting;Meal timing in regards to the patients' current diabetes medication.;Information on hints to eating out and maintain blood glucose control.   Physical activity and exercise  Identified with patient nutritional and/or medication changes necessary with exercise.;Helped patient identify appropriate exercises in relation to his/her diabetes, diabetes complications and other health issue.;Role of exercise on diabetes management, blood pressure control and cardiac health.   Medications Taught/reviewed insulin injection, site rotation, insulin storage and needle disposal.;Reviewed patients medication for diabetes, action, purpose, timing of dose and side effects.;Reviewed medication adjustment guidelines for hyperglycemia and sick days.   Monitoring Taught/evaluated SMBG meter.;Purpose and frequency of SMBG.;Interpreting lab values - A1C, lipid, urine microalbumina.;Taught/discussed recording of test results and interpretation of SMBG.;Identified appropriate SMBG and/or A1C goals.;Daily foot exams;Yearly dilated eye exam   Acute complications Taught treatment of hypoglycemia - the 15 rule.;Discussed and identified patients' treatment of  hyperglycemia.   Chronic complications Relationship between chronic complications and blood glucose control;Assessed and discussed foot care and prevention of foot problems;Lipid levels, blood glucose control and heart disease;Identified and discussed with patient  current chronic complications;Dental care;Retinopathy and reason for yearly dilated eye exams   Psychosocial adjustment Worked with patient to identify barriers to care and solutions;Role of stress on diabetes;Helped patient identify a support system for diabetes management;Travel strategies;Identified and addressed patients feelings and concerns about diabetes;Brainstormed with patient on coping mechanisms for social situations, getting support from significant others, dealing with feelings about diabetes   Preconception care Pregnancy and GDM  Role of pre-pregnancy blood glucose control on the development of the fetus;Reviewed with patient blood glucose goals with pregnancy;Role of family planning for patients with diabetes   Personal strategies to promote health Lifestyle issues that need to be addressed for better diabetes care;Helped patient develop diabetes management plan for (enter comment)     Individualized Goals (developed by patient)   Nutrition General guidelines for healthy choices and portions discussed;Follow meal plan discussed;Adjust meds/carbs with exercise as discussed   Physical Activity Exercise 3-5 times per week;30 minutes per day  Medications take my medication as prescribed   Monitoring  test my blood glucose as discussed;send in my blood glucose log as discussed;test blood glucose pre and post meals as discussed   Reducing Risk examine blood glucose patterns;get labs drawn;do foot checks daily;treat hypoglycemia with 15 grams of carbs if blood glucose less than 70mg /dL;increase portions of healthy fats     Post-Education Assessment   Patient understands the diabetes disease and treatment process. Needs Review    Patient understands incorporating nutritional management into lifestyle. Needs Review   Patient undertands incorporating physical activity into lifestyle. Needs Review   Patient understands using medications safely. Needs Review   Patient understands monitoring blood glucose, interpreting and using results Needs Review   Patient understands prevention, detection, and treatment of acute complications. Needs Review   Patient understands prevention, detection, and treatment of chronic complications. Needs Review   Patient understands how to develop strategies to address psychosocial issues. Needs Review   Patient understands how to develop strategies to promote health/change behavior. Needs Review     Outcomes   Expected Outcomes Demonstrated interest in learning. Expect positive outcomes   Future DMSE --  1 week   Program Status Completed      Individualized Plan for Diabetes Self-Management Training:   Learning Objective:  Patient will have a greater understanding of diabetes self-management. Patient education plan is to attend individual and/or group sessions per assessed needs and concerns.   Plan: Goals 1. Dirnk more water 2. Continue to increase fresh fruits and vegetables 3. Eat more baked and broiled foods and less fast foods 4. Watch portions 5. Cut out sodas Walk 15 minutes most days of week   Expected Outcomes:     Education material provided: Living Well with Diabetes, Food label handouts, A1C conversion sheet, Meal plan card, My Plate and Carbohydrate counting sheet  If problems or questions, patient to contact team via:  Phone and Email  Future DSME appointment:  . Recommend to stop her Glipizide as risks outweigh benefits. Will follow up in 1 month and evalute BS. To test BID instead of QID now.

## 2016-05-10 ENCOUNTER — Ambulatory Visit (INDEPENDENT_AMBULATORY_CARE_PROVIDER_SITE_OTHER): Payer: BLUE CROSS/BLUE SHIELD | Admitting: Nurse Practitioner

## 2016-05-10 ENCOUNTER — Encounter: Payer: Self-pay | Admitting: Nurse Practitioner

## 2016-05-10 VITALS — BP 122/78 | Temp 98.7°F | Ht 67.5 in | Wt 196.0 lb

## 2016-05-10 DIAGNOSIS — J300 Vasomotor rhinitis: Secondary | ICD-10-CM | POA: Diagnosis not present

## 2016-05-10 NOTE — Patient Instructions (Signed)
Daily antihistamine Nasacort and Rhinocort

## 2016-05-11 ENCOUNTER — Encounter: Payer: Self-pay | Admitting: Nurse Practitioner

## 2016-05-11 NOTE — Progress Notes (Signed)
Subjective:  Presents for complaints of bilateral ear discomfort that began 4 days ago. No fever. No sore throat or headache. Slight cough that began this morning. No wheezing. Has not tried any medications.  Objective:   BP 122/78   Temp 98.7 F (37.1 C) (Oral)   Ht 5' 7.5" (1.715 m)   Wt 196 lb (88.9 kg)   BMI 30.24 kg/m  NAD. Alert, oriented. TMs mildly retracted bilateral, no erythema. Pharynx clear. Neck supple with minimal adenopathy. Lungs clear. Heart regular rate rhythm.  Assessment:  Acute vasomotor rhinitis    Plan:   Recommend daily antihistamine and OTC steroid nasal spray. Warning signs reviewed. Call back if worsens or persists.

## 2016-06-06 ENCOUNTER — Encounter: Payer: BLUE CROSS/BLUE SHIELD | Attending: Nurse Practitioner | Admitting: Nutrition

## 2016-06-06 ENCOUNTER — Encounter: Payer: Self-pay | Admitting: Nutrition

## 2016-06-06 VITALS — Ht 67.5 in | Wt 199.0 lb

## 2016-06-06 DIAGNOSIS — E669 Obesity, unspecified: Secondary | ICD-10-CM

## 2016-06-06 DIAGNOSIS — Z6830 Body mass index (BMI) 30.0-30.9, adult: Secondary | ICD-10-CM | POA: Insufficient documentation

## 2016-06-06 DIAGNOSIS — IMO0002 Reserved for concepts with insufficient information to code with codable children: Secondary | ICD-10-CM

## 2016-06-06 DIAGNOSIS — E118 Type 2 diabetes mellitus with unspecified complications: Secondary | ICD-10-CM

## 2016-06-06 DIAGNOSIS — Z713 Dietary counseling and surveillance: Secondary | ICD-10-CM | POA: Insufficient documentation

## 2016-06-06 DIAGNOSIS — Z794 Long term (current) use of insulin: Secondary | ICD-10-CM | POA: Insufficient documentation

## 2016-06-06 DIAGNOSIS — E119 Type 2 diabetes mellitus without complications: Secondary | ICD-10-CM | POA: Insufficient documentation

## 2016-06-06 DIAGNOSIS — E1165 Type 2 diabetes mellitus with hyperglycemia: Secondary | ICD-10-CM

## 2016-06-06 NOTE — Progress Notes (Signed)
Diabetes Self-Management Education  Visit Type:    Appt. Start Time: 1100  Appt. End Time:  1230  06/06/2016  Tara Bryant, identified by name and date of birth, is a 46 y.o. female with a diagnosis of Diabetes: Type 2.  Lost 1 lb.  Changes made: cut back on sodas. Is under a lot of stress caring for her fathers estate and other family issues. She hasn't been able to exercise much. 14 day avg 173 mg/dl. FBS are 117-170's. Diet is still excessive in carbs at times but she is working on improving her food choices.. Occasionally forgets her lantus due to falling asleep at night. Will take it close to dinner or set timer on phone so she won't forget.     She has cut out snacks and trying to eat more lower carb vegetables.   Meter brought in 14 days avg 172 mg/dl, which is higher than her previous 14 day avg last visit of 153 mg/dl. She notes she gets symptoms of low blood sugars before lunch at times and has to eat to prevent low blood sugars.   She would benefit from stopping Glipizide to reduce potential low blood sugars, weight gain and increased appetite.             B) Cinnamon french toast with milk, water L) Vegetable soup 1 cup, toss salad with ranch dressing, unsweet tea D) Grilled chicken sandwich and sweet potato fries, unsweet team  Metformin 1000 MG BID Lantus  66. Units at night. Glipizide 2.5 mg BID.    Wt Readings from Last 3 Encounters:  06/06/16 199 lb (90.3 kg)  05/10/16 196 lb (88.9 kg)  05/08/16 198 lb (89.8 kg)   Ht Readings from Last 3 Encounters:  06/06/16 5' 7.5" (1.715 m)  05/10/16 5' 7.5" (1.715 m)  04/29/16 5' 7.5" (1.715 m)   Body mass index is 30.71 kg/m.    ASSESSMENT  Height 5' 7.5" (1.715 m), weight 199 lb (90.3 kg). Body mass index is 30.71 kg/m.  She is eating more carbs and having some episodes of low blood sugars causing her to over eat at times and impact blood sugars and weight gain.  Individualized Plan for Diabetes  Self-Management Training:   Learning Objective:  Patient will have a greater understanding of diabetes self-management. Patient education plan is to attend individual and/or group sessions per assessed needs and concerns.   Goals 1. Talk to MD about stopping Glipizide 2. Eat 30-45 grams of carbs per meal 3. Increase high fiber, low carb vegetables with meals 4. Drink more water and no sodas, juice or tea 5. Exercise 30 minutes daily. Lose 1 lb per week. Talk to Dr. Wolfgang Phoenix about considering a SGLT2 Medication to help improve blood sugars and provide some weight loss. May want to discuss with MD  if you need medication to help with your anxiety- Celexa or some other low dose medication for anxiety/milkd depression to help you not feel so overwhelmed.   Expected Outcomes:     Education material provided: Living Well with Diabetes, Food label handouts, A1C conversion sheet, Meal plan card, My Plate and Carbohydrate counting sheet  If problems or questions, patient to contact team via:  Phone and Email  Future DSME appointment:  . Recommend to stop her Glipizide as risks outweigh benefits. Will  Consider a SGLT2 instead of Glipizide to help improve blood sugars and provide some weight loss. She also wants to discuss medication- Celexa or  ?  that  may help her with her anxiety and being overwhelmed with her family situation.

## 2016-06-06 NOTE — Patient Instructions (Addendum)
Goals 1. Talk to MD about stopping Glipizide 2. Eat 30-45 grams of carbs per meal-cut out sugared cereall 3. Increase high fiber, low carb vegetables with meals 4. Drink more water and no sodas, juice or tea 5. Exercise 30 minutes daily. Lose 1 lb per week. Talk to Dr. Wolfgang Phoenix about considering a SGLT2 Medication to help improve blood sugars and provide some weight loss. May want to discuss with MD  if you need medication to help with your anxiety- Celexa or some other low dose medication for anxiety/milkd depression to help you not feel so overwhelmed.

## 2016-06-19 ENCOUNTER — Ambulatory Visit: Payer: BLUE CROSS/BLUE SHIELD | Admitting: Nurse Practitioner

## 2016-06-20 ENCOUNTER — Encounter: Payer: Self-pay | Admitting: Nurse Practitioner

## 2016-06-20 ENCOUNTER — Ambulatory Visit (INDEPENDENT_AMBULATORY_CARE_PROVIDER_SITE_OTHER): Payer: BLUE CROSS/BLUE SHIELD | Admitting: Nurse Practitioner

## 2016-06-20 VITALS — BP 130/80 | Ht 67.5 in | Wt 197.1 lb

## 2016-06-20 DIAGNOSIS — H9191 Unspecified hearing loss, right ear: Secondary | ICD-10-CM | POA: Diagnosis not present

## 2016-06-20 DIAGNOSIS — E119 Type 2 diabetes mellitus without complications: Secondary | ICD-10-CM

## 2016-06-20 DIAGNOSIS — J300 Vasomotor rhinitis: Secondary | ICD-10-CM | POA: Diagnosis not present

## 2016-06-20 LAB — POCT GLYCOSYLATED HEMOGLOBIN (HGB A1C): Hemoglobin A1C: 6

## 2016-06-20 MED ORDER — INSULIN GLARGINE 100 UNIT/ML SOLOSTAR PEN: PEN_INJECTOR | SUBCUTANEOUS | 5 refills | Status: DC

## 2016-06-21 ENCOUNTER — Encounter: Payer: Self-pay | Admitting: Nurse Practitioner

## 2016-06-21 NOTE — Progress Notes (Signed)
Subjective:  Presents for recheck on her diabetes. Blood sugars running on average 130-160 in the mornings. Recently saw the dietitian. Doing much better with her diet although minimal weight loss. No chest pain/ischemic type pain or shortness of breath. Does some walking for activity. Needs her eye exam. Also complaints of decreased hearing off and on in her right ear. Mild head congestion. No fever cough. Rare sinus pressure.  Objective:   BP 130/80   Ht 5' 7.5" (1.715 m)   Wt 197 lb 2 oz (89.4 kg)   BMI 30.42 kg/m  NAD. Alert, oriented. TMs significant clear effusion bilaterally, greater on the right. Pharynx nonerythematous with clear PND noted. Neck supple with mild soft anterior adenopathy. Lungs clear. Heart regular rate rhythm. Carotids no bruits or thrills. Results for orders placed or performed in visit on 06/20/16  POCT glycosylated hemoglobin (Hb A1C)  Result Value Ref Range   Hemoglobin A1C 6.0    Diabetic Foot Exam - Simple   Simple Foot Form Diabetic Foot exam was performed with the following findings:  Yes 06/20/2016  9:20 AM  Visual Inspection No deformities, no ulcerations, no other skin breakdown bilaterally:  Yes Sensation Testing Intact to touch and monofilament testing bilaterally:  Yes Pulse Check See comments:  Yes Comments DP pulses present bilaterally. Toes normal capillary refill.   Failed her hearing test see results.  Assessment:   Problem List Items Addressed This Visit    None    Visit Diagnoses    Diabetes mellitus without complication ([place])    -  Primary   Relevant Medications   Insulin Glargine (LANTUS SOLOSTAR) 100 UNIT/ML Solostar Pen   Other Relevant Orders   POCT glycosylated hemoglobin (Hb A1C) (Completed)   Chronic vasomotor rhinitis       Hearing problem of right ear           Plan:   Meds ordered this encounter  Medications  . Insulin Glargine (LANTUS SOLOSTAR) 100 UNIT/ML Solostar Pen    Sig: Inject 66 units into the skin qhs     Dispense:  15 mL    Refill:  5    Order Specific Question:   Supervising Provider    Answer:   Mikey Kirschner [[id number]]   Continue steroid nasal spray and daily antihistamine. Patient defers referral to ENT specialist at this point. Call back if hearing issues persist, recommend referral at that time. Continue current medications. Encouraged continue weight loss efforts, also increased activity. Return in about 4 months (around 10/20/2016) for recheck diabetes.

## 2016-08-07 ENCOUNTER — Ambulatory Visit: Payer: BLUE CROSS/BLUE SHIELD | Admitting: Nutrition

## 2016-08-12 ENCOUNTER — Other Ambulatory Visit: Payer: Self-pay | Admitting: Family Medicine

## 2016-08-12 DIAGNOSIS — Z1231 Encounter for screening mammogram for malignant neoplasm of breast: Secondary | ICD-10-CM

## 2016-08-29 ENCOUNTER — Telehealth: Payer: Self-pay | Admitting: Nurse Practitioner

## 2016-08-29 DIAGNOSIS — H6590 Unspecified nonsuppurative otitis media, unspecified ear: Secondary | ICD-10-CM

## 2016-08-29 NOTE — Telephone Encounter (Signed)
Either is fine. Let me know if she is ready for a referral or I can recheck first if needed.

## 2016-08-29 NOTE — Telephone Encounter (Signed)
Patient said she seen Tara Bryant a couple of month ago due to fluid in her ear.  She said she feels like it hasn't got any better and wants to know if she should be referred to ENT or come back in our office?

## 2016-08-30 NOTE — Telephone Encounter (Signed)
Pt states she still doesn't know which one she should do. Consult with carolyn referral to ENT. Referral put in. Pt notiifed.

## 2016-08-30 NOTE — Telephone Encounter (Signed)
Tried to call no answer

## 2016-09-06 ENCOUNTER — Other Ambulatory Visit: Payer: Self-pay | Admitting: Family Medicine

## 2016-09-07 ENCOUNTER — Other Ambulatory Visit: Payer: Self-pay | Admitting: Nurse Practitioner

## 2016-09-13 ENCOUNTER — Ambulatory Visit
Admission: RE | Admit: 2016-09-13 | Discharge: 2016-09-13 | Disposition: A | Discharge status: Home / Self Care | Payer: BLUE CROSS/BLUE SHIELD | Source: Ambulatory Visit | Attending: Family Medicine | Admitting: Family Medicine

## 2016-09-13 DIAGNOSIS — Z1231 Encounter for screening mammogram for malignant neoplasm of breast: Secondary | ICD-10-CM

## 2016-10-16 ENCOUNTER — Ambulatory Visit: Payer: BLUE CROSS/BLUE SHIELD | Admitting: Nurse Practitioner

## 2016-10-17 ENCOUNTER — Ambulatory Visit (INDEPENDENT_AMBULATORY_CARE_PROVIDER_SITE_OTHER): Payer: BLUE CROSS/BLUE SHIELD | Admitting: Nurse Practitioner

## 2016-10-17 ENCOUNTER — Telehealth: Payer: Self-pay | Admitting: *Deleted

## 2016-10-17 ENCOUNTER — Ambulatory Visit (HOSPITAL_COMMUNITY)
Admission: RE | Admit: 2016-10-17 | Discharge: 2016-10-17 | Disposition: A | Discharge status: Home / Self Care | Payer: BLUE CROSS/BLUE SHIELD | Source: Ambulatory Visit | Attending: Nurse Practitioner | Admitting: Nurse Practitioner

## 2016-10-17 ENCOUNTER — Encounter: Payer: Self-pay | Admitting: Nurse Practitioner

## 2016-10-17 VITALS — BP 120/84 | Temp 97.8°F | Ht 67.5 in | Wt 202.2 lb

## 2016-10-17 DIAGNOSIS — I80292 Phlebitis and thrombophlebitis of other deep vessels of left lower extremity: Secondary | ICD-10-CM | POA: Diagnosis present

## 2016-10-17 DIAGNOSIS — I83892 Varicose veins of left lower extremities with other complications: Secondary | ICD-10-CM | POA: Diagnosis not present

## 2016-10-17 MED ORDER — FLUCONAZOLE 150 MG PO TABS: ORAL_TABLET | ORAL | 0 refills | Status: DC

## 2016-10-17 MED ORDER — MELOXICAM 15 MG PO TABS: 15.0000 mg | ORAL_TABLET | Freq: Every day | ORAL | 0 refills | Status: DC

## 2016-10-17 MED ORDER — CEPHALEXIN 500 MG PO CAPS: 500.0000 mg | ORAL_CAPSULE | Freq: Three times a day (TID) | ORAL | 0 refills | Status: DC

## 2016-10-17 NOTE — Telephone Encounter (Signed)
Discussed with pt. Pt states she thought you were going to send over a medication for yeast. Please advise.

## 2016-10-17 NOTE — Telephone Encounter (Signed)
See result note.  

## 2016-10-17 NOTE — Telephone Encounter (Signed)
done

## 2016-10-17 NOTE — Telephone Encounter (Signed)
Left message to return call. U/S negative for DVT and to start taking her meds per Hoyle Sauer.

## 2016-10-17 NOTE — Progress Notes (Signed)
Subjective:  Presents for c/o pain and redness in the upper left thigh that began about a week ago. No fever. Has had superficial thrombophlebitis once before. No CP or SOB. Does not take Estrogen.   Objective:   BP 120/84   Temp 97.8 F (36.6 C) (Oral)   Ht 5' 7.5" (1.715 m)   Wt 202 lb 4 oz (91.7 kg)   BMI 31.21 kg/m  NAD. Alert, oriented. Lungs clear. Heart RRR. Linear area going from upper mid left thigh to just below the knee. Mildly edematous with mild erythema and warmth, fine nodularity in upper area. Mildly tender to palpation. Significant varicose veins noted.  Venous US shows no DVT; thrombosed varicosities   Assessment:   Thrombophlebitis of left leg ([place]) - Plan: US Venous Img Lower Unilateral Left    Plan:   Meds ordered this encounter  Medications  . NORLYDA 0.35 MG tablet    Sig: TK 1 T PO D    Refill:  12  . meloxicam (MOBIC) 15 MG tablet    Sig: Take 1 tablet (15 mg total) by mouth daily.    Dispense:  30 tablet    Refill:  0    Order Specific Question:   Supervising Provider    Answer:   Mikey Kirschner [[id number]]  . cephALEXin (KEFLEX) 500 MG capsule    Sig: Take 1 capsule (500 mg total) by mouth 3 (three) times daily.    Dispense:  21 capsule    Refill:  0    Order Specific Question:   Supervising Provider    Answer:   Mikey Kirschner [[id number]]  . fluconazole (DIFLUCAN) 150 MG tablet    Sig: One po qd prn yeast infection; may repeat in 3-4 days if needed    Dispense:  2 tablet    Refill:  0    Order Specific Question:   Supervising Provider    Answer:   Mikey Kirschner [[id number]]   Warm compresses to area.  Call back next week if no improvement, sooner if worse. Warning signs reviewed.

## 2016-10-30 ENCOUNTER — Other Ambulatory Visit: Payer: Self-pay | Admitting: Family Medicine

## 2016-11-01 ENCOUNTER — Encounter: Payer: Self-pay | Admitting: Nurse Practitioner

## 2016-11-01 ENCOUNTER — Ambulatory Visit (INDEPENDENT_AMBULATORY_CARE_PROVIDER_SITE_OTHER): Payer: BLUE CROSS/BLUE SHIELD | Admitting: Nurse Practitioner

## 2016-11-01 VITALS — BP 132/90 | Ht 67.5 in | Wt 202.0 lb

## 2016-11-01 DIAGNOSIS — E119 Type 2 diabetes mellitus without complications: Secondary | ICD-10-CM

## 2016-11-01 DIAGNOSIS — L680 Hirsutism: Secondary | ICD-10-CM | POA: Diagnosis not present

## 2016-11-01 DIAGNOSIS — I10 Essential (primary) hypertension: Secondary | ICD-10-CM

## 2016-11-01 DIAGNOSIS — Z794 Long term (current) use of insulin: Secondary | ICD-10-CM | POA: Diagnosis not present

## 2016-11-01 LAB — POCT GLYCOSYLATED HEMOGLOBIN (HGB A1C): Hemoglobin A1C: 7.5

## 2016-11-04 ENCOUNTER — Encounter: Payer: Self-pay | Admitting: Nurse Practitioner

## 2016-11-04 DIAGNOSIS — L680 Hirsutism: Secondary | ICD-10-CM | POA: Insufficient documentation

## 2016-11-04 NOTE — Progress Notes (Signed)
Subjective:  Presents for recheck on diabetes. Has not done as well with diet lately. Limited activity. Used to have a dog which she walked daily but he has died. Craving sweets. No CP/ischemic type pain or SOB. Thrombophlebitis has resolved. Has had more problems with excessive hair growth lately.   Objective:   BP 132/90   Ht 5' 7.5" (1.715 m)   Wt 202 lb (91.6 kg)   BMI 31.17 kg/m  NAD. Alert, oriented. Lungs clear. Heart RRR. LE: no edema.  Results for orders placed or performed in visit on 11/01/16  POCT glycosylated hemoglobin (Hb A1C)  Result Value Ref Range   Hemoglobin A1C 7.5      Assessment:   Problem List Items Addressed This Visit      Cardiovascular and Mediastinum   Essential hypertension, benign     Endocrine   Type 2 diabetes mellitus without complication, with long-term current use of insulin ([place]) - Primary   Relevant Orders   POCT glycosylated hemoglobin (Hb A1C) (Completed)     Musculoskeletal and Integument   Hirsutism        Plan:  Continue current medications. A1C was 6.0 at last visit. Recommend patient look at lifestyle measures she was doing at the time and go back to these due to the excellent control at the time. Slowly increase activity.    Return in about 4 months (around 03/04/2017) for diabetes check up.

## 2016-12-11 ENCOUNTER — Other Ambulatory Visit: Payer: Self-pay | Admitting: Family Medicine

## 2016-12-28 ENCOUNTER — Other Ambulatory Visit: Payer: Self-pay | Admitting: Nurse Practitioner

## 2016-12-30 ENCOUNTER — Other Ambulatory Visit: Payer: Self-pay | Admitting: Family Medicine

## 2017-01-03 ENCOUNTER — Ambulatory Visit (INDEPENDENT_AMBULATORY_CARE_PROVIDER_SITE_OTHER): Payer: BLUE CROSS/BLUE SHIELD | Admitting: *Deleted

## 2017-01-03 DIAGNOSIS — Z23 Encounter for immunization: Secondary | ICD-10-CM

## 2017-01-15 ENCOUNTER — Encounter: Payer: Self-pay | Admitting: Nurse Practitioner

## 2017-01-15 ENCOUNTER — Ambulatory Visit (INDEPENDENT_AMBULATORY_CARE_PROVIDER_SITE_OTHER): Payer: BLUE CROSS/BLUE SHIELD | Admitting: Nurse Practitioner

## 2017-01-15 VITALS — BP 126/84 | Ht 67.5 in | Wt 205.0 lb

## 2017-01-15 DIAGNOSIS — M7732 Calcaneal spur, left foot: Secondary | ICD-10-CM | POA: Diagnosis not present

## 2017-01-15 MED ORDER — TERCONAZOLE 0.4 % VA CREA: 1.0000 | TOPICAL_CREAM | Freq: Every day | VAGINAL | 0 refills | Status: DC

## 2017-01-15 MED ORDER — NAPROXEN 500 MG PO TABS: 500.0000 mg | ORAL_TABLET | Freq: Two times a day (BID) | ORAL | 0 refills | Status: DC

## 2017-01-15 NOTE — Progress Notes (Signed)
Subjective:  Presents for complaints of left heel pain for the past several weeks. No specific history of injury. Pain with walking or with sitting at times. Has a new pair of athletic shoes for the past few months. Also having urinary frequency and urgency times to the point where she almost wets herself. Has not been checking her sugar home. Patient relates this to eating [place] food last week. Also having a vaginal yeast infection.  Objective:   BP 126/84   Ht 5' 7.5" (1.715 m)   Wt 205 lb (93 kg)   BMI 31.63 kg/m  NAD. Alert, oriented. Lungs clear. Heart regular rate rhythm. Abdomen soft nondistended nontender. Left heel distinct pain noted in the medial aspect of the left heel. No tenderness noted with palpation of the bursa or Achilles tendon. Gait normal limit.  Assessment:  Calcaneal spur of left foot    Plan:   Meds ordered this encounter  Medications  . DISCONTD: naproxen (NAPROSYN) 500 MG tablet    Sig: Take 1 tablet (500 mg total) by mouth 2 (two) times daily with a meal.    Dispense:  30 tablet    Refill:  0    Order Specific Question:   Supervising Provider    Answer:   Mikey Kirschner [[id number]]  . DISCONTD: terconazole (TERAZOL 7) 0.4 % vaginal cream    Sig: Place 1 applicator vaginally at bedtime. x7d    Dispense:  45 g    Refill:  0    Order Specific Question:   Supervising Provider    Answer:   Mikey Kirschner [[id number]]  . naproxen (NAPROSYN) 500 MG tablet    Sig: Take 1 tablet (500 mg total) by mouth 2 (two) times daily with a meal.    Dispense:  30 tablet    Refill:  0  . terconazole (TERAZOL 7) 0.4 % vaginal cream    Sig: Place 1 applicator vaginally at bedtime. x7d    Dispense:  45 g    Refill:  0   Switch to naproxen as directed. Terazol cream as directed for yeast infection. Given information on heel pain/bone spurs. Strongly recommend the patient check her blood sugar regularly over the next few days and send results to my chart or call in by Friday.  Highly suspect elevated blood sugars since patient has not been eating very healthy. Further follow-up based on results. Call back in 2-3 weeks if heel pain persists. Otherwise routine follow-up.

## 2017-01-15 NOTE — Patient Instructions (Addendum)
  Lidocaine patch   Heel Spur A heel spur is a bony growth that forms on the bottom of your heel bone (calcaneus). Heel spurs are common and do not always cause pain. However, heel spurs often cause inflammation in the strong band of tissue that runs underneath the bone of your foot (plantar fascia). When this happens, you may feel pain on the bottom of your foot, near your heel. What are the causes? The cause of heel spurs is not completely understood. They may be caused by pressure on the heel. Or, they may stem from the muscle attachments (tendons) near the spur pulling on the heel. What increases the risk? You may be at risk for a heel spur if you:  Are older than 40.  Are overweight.  Have wear and tear arthritis (osteoarthritis).  Have plantar fascia inflammation.  What are the signs or symptoms? Some people have heel spurs but no symptoms. If you do have symptoms, they may include:  Pain in the bottom of your heel.  Pain that is worse when you first get out of bed.  Pain that gets worse after walking or standing.  How is this diagnosed? Your health care provider may diagnose a heel spur based on your symptoms and a physical exam. You may also have an X-ray of your foot to check for a bony growth coming from the calcaneus. How is this treated? Treatment aims to relieve the pain from the heel spur. This may include:  Stretching exercises.  Losing weight.  Wearing specific shoes, inserts, or orthotics for comfort and support.  Wearing splints at night to properly position your feet.  Taking over-the-counter medicine to relieve pain.  Being treated with high-intensity sound waves to break up the heel spur (extracorporeal shock wave therapy).  Getting steroid injections in your heel to reduce swelling and ease pain.  Having surgery if your heel spur causes long-term (chronic) pain.  Follow these instructions at home:  Take medicines only as directed by your health  care provider.  Ask your health care provider if you should use ice or cold packs on the painful areas of your heel or foot.  Avoid activities that cause you pain until you recover or as directed by your health care provider.  Stretch before exercising or being physically active.  Wear supportive shoes that fit well as directed by your health care provider. You might need to buy new shoes. Wearing old shoes or shoes that do not fit correctly may not provide the support that you need.  Lose weight if your health care provider thinks you should. This can relieve pressure on your foot that may be causing pain and discomfort. Contact a health care provider if:  Your pain continues or gets worse. This information is not intended to replace advice given to you by your health care provider. Make sure you discuss any questions you have with your health care provider. Document Released: 05/01/2005 Document Revised: 08/31/2015 Document Reviewed: 05/26/2013 Elsevier Interactive Patient Education  [employer].

## 2017-02-14 ENCOUNTER — Other Ambulatory Visit: Payer: Self-pay | Admitting: Nurse Practitioner

## 2017-03-03 ENCOUNTER — Encounter: Payer: Self-pay | Admitting: Nurse Practitioner

## 2017-03-03 ENCOUNTER — Ambulatory Visit: Payer: BLUE CROSS/BLUE SHIELD | Admitting: Nurse Practitioner

## 2017-03-03 VITALS — BP 126/80 | Ht 68.0 in | Wt 196.5 lb

## 2017-03-03 DIAGNOSIS — K580 Irritable bowel syndrome with diarrhea: Secondary | ICD-10-CM | POA: Diagnosis not present

## 2017-03-03 DIAGNOSIS — E119 Type 2 diabetes mellitus without complications: Secondary | ICD-10-CM

## 2017-03-03 DIAGNOSIS — I10 Essential (primary) hypertension: Secondary | ICD-10-CM | POA: Diagnosis not present

## 2017-03-03 DIAGNOSIS — E781 Pure hyperglyceridemia: Secondary | ICD-10-CM

## 2017-03-03 DIAGNOSIS — Z794 Long term (current) use of insulin: Secondary | ICD-10-CM

## 2017-03-03 LAB — POCT GLYCOSYLATED HEMOGLOBIN (HGB A1C): Hemoglobin A1C: 9.4

## 2017-03-03 MED ORDER — METFORMIN HCL ER (MOD) 1000 MG PO TB24: 1000.0000 mg | ORAL_TABLET | Freq: Every day | ORAL | 0 refills | Status: DC

## 2017-03-03 NOTE — Patient Instructions (Signed)
Increase Lantus by 2 units every 3 nights until fasting sugar is below 130; up to a max of 80 units

## 2017-03-03 NOTE — Progress Notes (Signed)
Subjective: Patient presents for recheck on her diabetes.  Takes her nighttime medications but has missed her morning medications at times.  Also not compliant with her insulin at times.  Limited activity.  States she can only stand for about 20 minutes at a time due to chronic back issues.  Has an eye exam scheduled for tomorrow morning.  Does not check her sugars at home.  Takes her metformin about once a day.  Has occasional diarrhea usually depending on where she eats although she has had it at home at times.  Usually unpredictable and uncontrollable.  No blood in her stools.  No fever.  Has a history of IBS.  Limited support system.  Not working at this time.  Her financial advisor has recommended she go back to work or apply for disability at this point.  Patient is reluctant to apply for disability at this time.  Objective:   BP 126/80   Ht 5\' 8"  (1.727 m)   Wt 196 lb 8 oz (89.1 kg)   BMI 29.88 kg/m  NAD.  Alert, oriented.  Thoughts logical coherent and relevant.  Dressed appropriately.  Making good eye contact.  Lungs clear.  Heart regular rate and rhythm.  Carotids no bruits or thrills.  Lower extremities no edema.  Abdomen soft nondistended nontender.  No obvious masses organomegaly. Results for orders placed or performed in visit on 03/03/17  POCT HgB A1C  Result Value Ref Range   Hemoglobin A1C 9.4    Her A1c has gone from 6.0 in the spring steadily rising to 9.4 today.  Assessment:   Problem List Items Addressed This Visit      Cardiovascular and Mediastinum   Essential hypertension, benign   Relevant Orders   Basic metabolic panel     Digestive   Irritable bowel syndrome     Endocrine   Type 2 diabetes mellitus without complication, with long-term current use of insulin (HCC) - Primary   Relevant Medications   metFORMIN (GLUMETZA) 1000 MG (MOD) 24 hr tablet   Other Relevant Orders   POCT HgB A1C (Completed)   Microalbumin / creatinine urine ratio   Basic metabolic  panel     Other   Hypertriglyceridemia, essential   Relevant Orders   Lipid panel   Hepatic function panel       Plan:   Meds ordered this encounter  Medications  . metFORMIN (GLUMETZA) 1000 MG (MOD) 24 hr tablet    Sig: Take 1 tablet (1,000 mg total) by mouth daily with breakfast.    Dispense:  30 tablet    Refill:  0    Order Specific Question:   Supervising Provider    Answer:   Mikey Kirschner [[id number]]   See if insurance will cover extended release metformin at a reasonable price.  If so start new metformin daily for 2 weeks.  If no improvement entire over this time, stop metformin completely for 2 weeks.  Call back at that time if no improvement in diarrhea.  Increase Lantus by 2 units every 3 nights until fasting sugar is below 130 up to a maximum of 80 units/day.  Strongly encourage compliance with medication.  Recommend walking program.  Also strongly encourage patient to make a decision about going to work on applying for disability.  In advantage of working would be socialization.  Also patient understands it may take 18-24 months to even get disability approved. 25 minutes was spent with the patient. Greater than half the time  was spent in discussion and answering questions and counseling regarding the issues that the patient came in for today. Return in about 3 months (around 06/03/2017) for recheck. Call back sooner if needed.

## 2017-03-03 NOTE — Progress Notes (Deleted)
3

## 2017-03-04 ENCOUNTER — Telehealth: Payer: Self-pay | Admitting: Family Medicine

## 2017-03-04 ENCOUNTER — Other Ambulatory Visit: Payer: Self-pay | Admitting: Family Medicine

## 2017-03-04 NOTE — Telephone Encounter (Signed)
Patient's Metformin ER 1000MG  was denied via patient's insurance and requires prior authorization. Please advise? This medication is costly even when insurance covers.

## 2017-03-05 NOTE — Telephone Encounter (Signed)
Stop Metformin for 1-2 weeks. Let us know through my chart or a call how her diarrhea is doing. Restart checking her sugars as we discussed.

## 2017-03-05 NOTE — Telephone Encounter (Signed)
Discussed with pt. Pt verbalized understanding.  °

## 2017-03-05 NOTE — Telephone Encounter (Signed)
Left message to return call 

## 2017-03-26 ENCOUNTER — Ambulatory Visit: Payer: BLUE CROSS/BLUE SHIELD | Admitting: Nurse Practitioner

## 2017-03-26 ENCOUNTER — Encounter: Payer: Self-pay | Admitting: Nurse Practitioner

## 2017-03-26 VITALS — BP 122/80 | HR 103 | Ht 67.5 in | Wt 201.6 lb

## 2017-03-26 DIAGNOSIS — J300 Vasomotor rhinitis: Secondary | ICD-10-CM | POA: Diagnosis not present

## 2017-03-26 DIAGNOSIS — K219 Gastro-esophageal reflux disease without esophagitis: Secondary | ICD-10-CM

## 2017-03-26 NOTE — Patient Instructions (Signed)
Loratadine or Allegra everyday Nasacort AQ or Flonase as directed Restart your Ranitidine    Food Choices for Gastroesophageal Reflux Disease, Adult When you have gastroesophageal reflux disease (GERD), the foods you eat and your eating habits are very important. Choosing the right foods can help ease your discomfort. What guidelines do I need to follow?  Choose fruits, vegetables, whole grains, and low-fat dairy products.  Choose low-fat meat, fish, and poultry.  Limit fats such as oils, salad dressings, butter, nuts, and avocado.  Keep a food diary. This helps you identify foods that cause symptoms.  Avoid foods that cause symptoms. These may be different for everyone.  Eat small meals often instead of 3 large meals a day.  Eat your meals slowly, in a place where you are relaxed.  Limit fried foods.  Cook foods using methods other than frying.  Avoid drinking alcohol.  Avoid drinking large amounts of liquids with your meals.  Avoid bending over or lying down until 2-3 hours after eating. What foods are not recommended? These are some foods and drinks that may make your symptoms worse: Vegetables Tomatoes. Tomato juice. Tomato and spaghetti sauce. Chili peppers. Onion and garlic. Horseradish. Fruits Oranges, grapefruit, and lemon (fruit and juice). Meats High-fat meats, fish, and poultry. This includes hot dogs, ribs, ham, sausage, salami, and bacon. Dairy Whole milk and chocolate milk. Sour cream. Cream. Butter. Ice cream. Cream cheese. Drinks Coffee and tea. Bubbly (carbonated) drinks or energy drinks. Condiments Hot sauce. Barbecue sauce. Sweets/Desserts Chocolate and cocoa. Donuts. Peppermint and spearmint. Fats and Oils High-fat foods. This includes [place] fries and potato chips. Other Vinegar. Strong spices. This includes black pepper, white pepper, red pepper, cayenne, curry powder, cloves, ginger, and chili powder. The items listed above may not be a  complete list of foods and drinks to avoid. Contact your dietitian for more information. This information is not intended to replace advice given to you by your health care provider. Make sure you discuss any questions you have with your health care provider. Document Released: 09/24/2011 Document Revised: 08/31/2015 Document Reviewed: 01/27/2013 Elsevier Interactive Patient Education  2017 [employer].

## 2017-03-27 ENCOUNTER — Encounter: Payer: Self-pay | Admitting: Nurse Practitioner

## 2017-03-27 LAB — BASIC METABOLIC PANEL
BUN/Creatinine Ratio: 10 (ref 9–23)
BUN: 7 mg/dL (ref 6–24)
CO2: 21 mmol/L (ref 20–29)
Calcium: 8.9 mg/dL (ref 8.7–10.2)
Chloride: 100 mmol/L (ref 96–106)
Creatinine, Ser: 0.72 mg/dL (ref 0.57–1.00)
GFR calc Af Amer: 115 mL/min/{1.73_m2} (ref >59)
GFR calc non Af Amer: 100 mL/min/{1.73_m2} (ref >59)
Glucose: 135 mg/dL — ABNORMAL HIGH (ref 65–99)
Potassium: 4.1 mmol/L (ref 3.5–5.2)
Sodium: 138 mmol/L (ref 134–144)

## 2017-03-27 LAB — LIPID PANEL
Chol/HDL Ratio: 4.5 ratio — ABNORMAL HIGH (ref 0.0–4.4)
Cholesterol, Total: 202 mg/dL — ABNORMAL HIGH (ref 100–199)
HDL: 45 mg/dL (ref >39)
LDL Calculated: 134 mg/dL — ABNORMAL HIGH (ref 0–99)
Triglycerides: 117 mg/dL (ref 0–149)
VLDL Cholesterol Cal: 23 mg/dL (ref 5–40)

## 2017-03-27 LAB — HEPATIC FUNCTION PANEL
ALT: 30 IU/L (ref 0–32)
AST: 17 IU/L (ref 0–40)
Albumin: 4 g/dL (ref 3.5–5.5)
Alkaline Phosphatase: 79 IU/L (ref 39–117)
Bilirubin Total: 0.4 mg/dL (ref 0.0–1.2)
Bilirubin, Direct: 0.12 mg/dL (ref 0.00–0.40)
Total Protein: 6.5 g/dL (ref 6.0–8.5)

## 2017-03-27 LAB — MICROALBUMIN / CREATININE URINE RATIO
Creatinine, Urine: 64.6 mg/dL
Microalb/Creat Ratio: 15 mg/g creat (ref 0.0–30.0)
Microalbumin, Urine: 9.7 ug/mL

## 2017-03-27 NOTE — Progress Notes (Signed)
Subjective: Presents for complaints of a popping sound in her throat followed by some coughing that began 4 days ago.  No fever or sore throat.  Slight headache last night which has resolved.  Occasional cough.  No wheezing.  No obvious reflux symptoms but does have a history of this, is not currently on her Zantac.  No nausea vomiting.  No ear pain.  States she can hold her mouth open at times and here the popping sound.  Has been interrupting her sleep.  No relief with sitting.  Drinking a large amount of caffeine.  Denies excessive intake of citrus or chocolate.  Has had 2 brief sharp pains localized to the upper left chest wall over the past few days only lasting a few seconds.  Unassociated with meals or activity.  Objective:   BP 122/80 (BP Location: Right Arm, Patient Position: Sitting)   Pulse (!) 103   Ht 5' 7.5" (1.715 m)   Wt 201 lb 9.6 oz (91.4 kg)   LMP 03/16/2017   SpO2 98%   BMI 31.11 kg/m  NAD.  Alert, oriented.  TMs clear effusion, no erythema.  Pharynx clear.  Neck supple with minimal adenopathy.  Lungs clear.  Heart regular rate and rhythm.  Abdomen soft nondistended with mild tenderness towards the left epigastric area.  No rebound or guarding.  No obvious masses.  Assessment:  Gastroesophageal reflux disease without esophagitis  Vasomotor rhinitis    Plan: Restart Zantac as directed.  Slowly wean off caffeine.  OTC antihistamine and steroid nasal spray as directed.  Call back in 2 weeks if symptoms persist, sooner if worse.  Warning signs reviewed.

## 2017-05-07 ENCOUNTER — Other Ambulatory Visit: Payer: Self-pay | Admitting: Family Medicine

## 2017-05-07 ENCOUNTER — Other Ambulatory Visit: Payer: Self-pay | Admitting: Nurse Practitioner

## 2017-06-03 ENCOUNTER — Ambulatory Visit: Payer: BLUE CROSS/BLUE SHIELD | Admitting: Nurse Practitioner

## 2017-07-23 ENCOUNTER — Other Ambulatory Visit: Payer: Self-pay | Admitting: Nurse Practitioner

## 2017-08-11 ENCOUNTER — Encounter: Payer: Self-pay | Admitting: Nurse Practitioner

## 2017-08-11 ENCOUNTER — Ambulatory Visit: Payer: BLUE CROSS/BLUE SHIELD | Admitting: Nurse Practitioner

## 2017-08-11 ENCOUNTER — Other Ambulatory Visit: Payer: Self-pay | Admitting: Nurse Practitioner

## 2017-08-11 ENCOUNTER — Telehealth: Payer: Self-pay | Admitting: Nurse Practitioner

## 2017-08-11 VITALS — BP 120/96 | Ht 67.5 in | Wt 196.0 lb

## 2017-08-11 DIAGNOSIS — I10 Essential (primary) hypertension: Secondary | ICD-10-CM

## 2017-08-11 DIAGNOSIS — K580 Irritable bowel syndrome with diarrhea: Secondary | ICD-10-CM | POA: Diagnosis not present

## 2017-08-11 DIAGNOSIS — R0683 Snoring: Secondary | ICD-10-CM | POA: Diagnosis not present

## 2017-08-11 DIAGNOSIS — E781 Pure hyperglyceridemia: Secondary | ICD-10-CM

## 2017-08-11 DIAGNOSIS — E119 Type 2 diabetes mellitus without complications: Secondary | ICD-10-CM

## 2017-08-11 DIAGNOSIS — Z79899 Other long term (current) drug therapy: Secondary | ICD-10-CM

## 2017-08-11 DIAGNOSIS — Z794 Long term (current) use of insulin: Secondary | ICD-10-CM

## 2017-08-11 DIAGNOSIS — R5383 Other fatigue: Secondary | ICD-10-CM

## 2017-08-11 LAB — POCT GLYCOSYLATED HEMOGLOBIN (HGB A1C): Hemoglobin A1C: 8.2

## 2017-08-11 MED ORDER — ATORVASTATIN CALCIUM 10 MG PO TABS
10.0000 mg | ORAL_TABLET | Freq: Every day | ORAL | 2 refills | Status: DC
Start: 2017-08-11 — End: 2018-01-15

## 2017-08-11 NOTE — Telephone Encounter (Signed)
She has not signed up for my chart yet so please call. I could not find in her paper or electronic chart where she took a statin for cholesterol. I will send in a low dose medication for her to start. Repeat lipid and liver tests in 8-10 weeks. Call back if any problems.

## 2017-08-11 NOTE — Patient Instructions (Addendum)
Metformin 1000 mg in the evening with supper Increase to Lantus by 2 units every 3 days to maximum dose of 80 mg  Irritable Bowel Syndrome, Adult Irritable bowel syndrome (IBS) is not one specific disease. It is a group of symptoms that affects the organs responsible for digestion (gastrointestinal or GI tract). To regulate how your GI tract works, your body sends signals back and forth between your intestines and your brain. If you have IBS, there may be a problem with these signals. As a result, your GI tract does not function normally. Your intestines may become more sensitive and overreact to certain things. This is especially true when you eat certain foods or when you are under stress. There are four types of IBS. These may be determined based on the consistency of your stool:  IBS with diarrhea.  IBS with constipation.  Mixed IBS.  Unsubtyped IBS.  It is important to know which type of IBS you have. Some treatments are more likely to be helpful for certain types of IBS. What are the causes? The exact cause of IBS is not known. What increases the risk? You may have a higher risk of IBS if:  You are a woman.  You are younger than 48 years old.  You have a family history of IBS.  You have mental health problems.  You have had bacterial infection of your GI tract.  What are the signs or symptoms? Symptoms of IBS vary from person to person. The main symptom is abdominal pain or discomfort. Additional symptoms usually include one or more of the following:  Diarrhea, constipation, or both.  Abdominal swelling or bloating.  Feeling full or sick after eating a small or regular-size meal.  Frequent gas.  Mucus in the stool.  A feeling of having more stool left after a bowel movement.  Symptoms tend to come and go. They may be associated with stress, psychiatric conditions, or nothing at all. How is this diagnosed? There is no specific test to diagnose IBS. Your health  care provider will make a diagnosis based on a physical exam, medical history, and your symptoms. You may have other tests to rule out other conditions that may be causing your symptoms. These may include:  Blood tests.  X-rays.  CT scan.  Endoscopy and colonoscopy. This is a test in which your GI tract is viewed with a long, thin, flexible tube.  How is this treated? There is no cure for IBS, but treatment can help relieve symptoms. IBS treatment often includes:  Changes to your diet, such as: ? Eating more fiber. ? Avoiding foods that cause symptoms. ? Drinking more water. ? Eating regular, medium-sized portioned meals.  Medicines. These may include: ? Fiber supplements if you have constipation. ? Medicine to control diarrhea (antidiarrheal medicines). ? Medicine to help control muscle spasms in your GI tract (antispasmodic medicines). ? Medicines to help with any mental health issues, such as antidepressants or tranquilizers.  Therapy. ? Talk therapy may help with anxiety, depression, or other mental health issues that can make IBS symptoms worse.  Stress reduction. ? Managing your stress can help keep symptoms under control.  Follow these instructions at home:  Take medicines only as directed by your health care provider.  Eat a healthy diet. ? Avoid foods and drinks with added sugar. ? Include more whole grains, fruits, and vegetables gradually into your diet. This may be especially helpful if you have IBS with constipation. ? Avoid any foods and drinks  that make your symptoms worse. These may include dairy products and caffeinated or carbonated drinks. ? Do not eat large meals. ? Drink enough fluid to keep your urine clear or pale yellow.  Exercise regularly. Ask your health care provider for recommendations of good activities for you.  Keep all follow-up visits as directed by your health care provider. This is important. Contact a health care provider if:  You  have constant pain.  You have trouble or pain with swallowing.  You have worsening diarrhea. Get help right away if:  You have severe and worsening abdominal pain.  You have diarrhea and: ? You have a rash, stiff neck, or severe headache. ? You are irritable, sleepy, or difficult to awaken. ? You are weak, dizzy, or extremely thirsty.  You have bright red blood in your stool or you have black tarry stools.  You have unusual abdominal swelling that is painful.  You vomit continuously.  You vomit blood (hematemesis).  You have both abdominal pain and a fever. This information is not intended to replace advice given to you by your health care provider. Make sure you discuss any questions you have with your health care provider. Document Released: 03/25/2005 Document Revised: 08/25/2015 Document Reviewed: 12/10/2013 Elsevier Interactive Patient Education  2018 [employer].  Diet for Irritable Bowel Syndrome When you have irritable bowel syndrome (IBS), the foods you eat and your eating habits are very important. IBS may cause various symptoms, such as abdominal pain, constipation, or diarrhea. Choosing the right foods can help ease discomfort caused by these symptoms. Work with your health care provider and dietitian to find the best eating plan to help control your symptoms. What general guidelines do I need to follow?  Keep a food diary. This will help you identify foods that cause symptoms. Write down: ? What you eat and when. ? What symptoms you have. ? When symptoms occur in relation to your meals.  Avoid foods that cause symptoms. Talk with your dietitian about other ways to get the same nutrients that are in these foods.  Eat more foods that contain fiber. Take a fiber supplement if directed by your dietitian.  Eat your meals slowly, in a relaxed setting.  Aim to eat 5-6 small meals per day. Do not skip meals.  Drink enough fluids to keep your urine clear or pale  yellow.  Ask your health care provider if you should take an over-the-counter probiotic during flare-ups to help restore healthy gut bacteria.  If you have cramping or diarrhea, try making your meals low in fat and high in carbohydrates. Examples of carbohydrates are pasta, rice, whole grain breads and cereals, fruits, and vegetables.  If dairy products cause your symptoms to flare up, try eating less of them. You might be able to handle yogurt better than other dairy products because it contains bacteria that help with digestion. What foods are not recommended? The following are some foods and drinks that may worsen your symptoms:  Fatty foods, such as [place] fries.  Milk products, such as cheese or ice cream.  Chocolate.  Alcohol.  Products with caffeine, such as coffee.  Carbonated drinks, such as soda.  The items listed above may not be a complete list of foods and beverages to avoid. Contact your dietitian for more information. What foods are good sources of fiber? Your health care provider or dietitian may recommend that you eat more foods that contain fiber. Fiber can help reduce constipation and other IBS symptoms. Add  foods with fiber to your diet a little at a time so that your body can get used to them. Too much fiber at once might cause gas and swelling of your abdomen. The following are some foods that are good sources of fiber:  Apples.  Peaches.  Pears.  Berries.  Figs.  Broccoli (raw).  Cabbage.  Carrots.  Raw peas.  Kidney beans.  Lima beans.  Whole grain bread.  Whole grain cereal.  Where to find more information: [employer] for Functional Gastrointestinal Disorders: www.iffgd.[employer] of Diabetes and Digestive and Kidney Diseases: [employer].co.za.aspx This information is not intended to replace advice given to you by your health care provider.  Make sure you discuss any questions you have with your health care provider. Document Released: 06/15/2003 Document Revised: 08/31/2015 Document Reviewed: 06/25/2013 Elsevier Interactive Patient Education  2018 [employer].

## 2017-08-11 NOTE — Progress Notes (Signed)
Subjective: Presents for recheck on her diabetes.  Taking her metformin thousand milligrams once a day with supper.  When she takes it in the mornings it increases her diarrhea.  Has not been checking her sugars at home.  Doing fine with her diet.  No activity. Has not tried to find a job at this time. Sleeping well. Has snoring and fatigue. Last sleep study 2011. Was on a statin at one point but stopped, not sure why. No family history of colon cancer. No CP/ischemic type pain or unusual SOB. Did not increase Lantus to 80 units as previously recommended. Rare hypoglycemic episode in the past, none for a long time.  Depression screen [facility] 2/9 08/11/2017 06/06/2016 04/29/2016  Decreased Interest 1 1 1   Down, Depressed, Hopeless 1 1 1   PHQ - 2 Score 2 2 2   Altered sleeping 0 1 1  Tired, decreased energy 1 2 1   Change in appetite 0 1 1  Feeling bad or failure about yourself  0 0 0  Trouble concentrating 0 0 0  Moving slowly or fidgety/restless 0 0 0  Suicidal thoughts 0 0 0  PHQ-9 Score 3 6 5   Difficult doing work/chores Not difficult at all - Somewhat difficult     Objective:   BP (!) 120/96   Ht 5' 7.5" (1.715 m)   Wt 196 lb 0.4 oz (88.9 kg)   BMI 30.25 kg/m  NAD.  Alert, oriented.  Lungs clear.  Heart regular rate and rhythm.  Carotids no bruits or thrills.  Lower extremities no edema. Diabetic Foot Exam - Simple   Simple Foot Form Diabetic Foot exam was performed with the following findings:  Yes 08/11/2017  9:20 AM  Visual Inspection No deformities, no ulcerations, no other skin breakdown bilaterally:  Yes Sensation Testing Intact to touch and monofilament testing bilaterally:  Yes Pulse Check See comments:  Yes Comments DP pulses present bilaterally. Mild dependent cyanosis but excellent cap refill    Results for orders placed or performed in visit on 08/11/17  POCT glycosylated hemoglobin (Hb A1C)  Result Value Ref Range   Hemoglobin A1C 8.2   Last A1C on 03/03/18 was 9.4 Labs  dated 03/26/18 show LDL 134, TG 117 and HDL 45.  Assessment:   Problem List Items Addressed This Visit      Cardiovascular and Mediastinum   Essential hypertension, benign     Digestive   Irritable bowel syndrome     Endocrine   Type 2 diabetes mellitus without complication, with long-term current use of insulin (HCC) - Primary   Relevant Orders   POCT glycosylated hemoglobin (Hb A1C) (Completed)     Other   Hypertriglyceridemia, essential    Other Visit Diagnoses    Snoring       Fatigue, unspecified type           Plan:  A1C is improving but not at goal. Increase to Lantus by 2 units every 3 days to maximum dose of 80 mg. Monitor BS at different times of the day and bring to next visit. Reviewed signs and symptoms of hypoglycemia with appropriate action.  Given written and verbal information on IBS including dietary measures.  Discussed importance of stress reduction.  Encouraged regular activity.  Also encourage patient to start a part-time job to help with socialization and finances.  We will review her old paper chart for sleep study results and see if she has taken a statin in the past.  Office will get back with  her about this. Return in about 3 months (around 11/11/2017) for recheck.  25 minutes was spent with the patient.  This statement verifies that 25 minutes was indeed spent with the patient. Greater than half the time was spent in discussion, counseling and answering questions  regarding the issues that the patient came in for today as reflected in the diagnosis (s) please refer to documentation for further details.

## 2017-08-11 NOTE — Telephone Encounter (Signed)
Left message to return call 

## 2017-08-12 NOTE — Telephone Encounter (Signed)
I called and left a message asked that she r/c. 

## 2017-08-12 NOTE — Addendum Note (Signed)
Addended by: Karle Barr on: 08/12/2017 03:09 PM   Modules accepted: Orders

## 2017-08-12 NOTE — Telephone Encounter (Signed)
Patient is aware and orders placed. Pt aware to bring back [employer].

## 2017-08-13 NOTE — Telephone Encounter (Signed)
Noted  

## 2017-09-23 ENCOUNTER — Other Ambulatory Visit: Payer: Self-pay | Admitting: Nurse Practitioner

## 2017-11-10 ENCOUNTER — Ambulatory Visit: Payer: BLUE CROSS/BLUE SHIELD | Admitting: Nurse Practitioner

## 2017-12-01 ENCOUNTER — Other Ambulatory Visit: Payer: Self-pay | Admitting: Family Medicine

## 2017-12-01 DIAGNOSIS — Z1231 Encounter for screening mammogram for malignant neoplasm of breast: Secondary | ICD-10-CM

## 2018-01-06 ENCOUNTER — Ambulatory Visit
Admission: RE | Admit: 2018-01-06 | Discharge: 2018-01-06 | Disposition: A | Discharge status: Home / Self Care | Payer: BLUE CROSS/BLUE SHIELD | Source: Ambulatory Visit | Attending: Family Medicine | Admitting: Family Medicine

## 2018-01-06 DIAGNOSIS — Z1231 Encounter for screening mammogram for malignant neoplasm of breast: Secondary | ICD-10-CM

## 2018-01-09 ENCOUNTER — Telehealth: Payer: Self-pay | Admitting: Family Medicine

## 2018-01-09 NOTE — Telephone Encounter (Signed)
Error

## 2018-01-12 ENCOUNTER — Ambulatory Visit (HOSPITAL_COMMUNITY)
Admission: RE | Admit: 2018-01-12 | Discharge: 2018-01-12 | Disposition: A | Discharge status: Home / Self Care | Payer: BLUE CROSS/BLUE SHIELD | Source: Ambulatory Visit | Attending: Family Medicine | Admitting: Family Medicine

## 2018-01-12 ENCOUNTER — Ambulatory Visit: Payer: BLUE CROSS/BLUE SHIELD | Admitting: Family Medicine

## 2018-01-12 ENCOUNTER — Encounter: Payer: Self-pay | Admitting: Family Medicine

## 2018-01-12 ENCOUNTER — Other Ambulatory Visit: Payer: Self-pay

## 2018-01-12 VITALS — BP 138/90 | Temp 97.7°F | Ht 67.5 in | Wt 201.2 lb

## 2018-01-12 DIAGNOSIS — R0781 Pleurodynia: Secondary | ICD-10-CM | POA: Diagnosis not present

## 2018-01-12 MED ORDER — METFORMIN HCL ER (MOD) 1000 MG PO TB24: 1000.0000 mg | ORAL_TABLET | Freq: Every day | ORAL | 0 refills | Status: DC

## 2018-01-12 NOTE — Progress Notes (Signed)
   Subjective:    [name], female    DOB: Jun 04, 1970, 47 y.o.   MRN: [id number]  Back Pain  This is a recurrent problem. Pain location: on sides. Quality: feels swollen. The symptoms are aggravated by standing (stood at the bank for 10 mins at back and sides began to hurt). Associated symptoms include chest pain. She has tried nothing for the symptoms.   Reports intermittent dull pain to sides of chest, rib cage area x 1 month. Reports tender to palpation, sometimes feels swollen. Denies any injury or new activities.  Reports a lot of stress in her life right now, symptoms seem to be worse when feeling stressed. Reports intermittent chest pain to left-side of chest described as sharp, lasts for seconds. Denies any N/V, denies any radiating of pain to left arm or neck. No aggravating or alleviating factors.    Review of Systems  Respiratory: Negative for cough and shortness of breath.   Cardiovascular: Positive for chest pain. Negative for leg swelling.  Gastrointestinal: Negative for nausea and vomiting.  Musculoskeletal: Negative for back pain.       Pain to bilateral sides of ribcage       Objective:   Physical Exam  Constitutional: She is oriented to person, place, and time. She appears well-developed and well-nourished. No distress.  HENT:  Head: Normocephalic and atraumatic.  Neck: Neck supple.  Cardiovascular: Normal rate, regular rhythm and normal heart sounds.  No murmur heard. Pulmonary/Chest: Effort normal and breath sounds normal. No respiratory distress. She exhibits tenderness.  Tender to palpation over bilateral sides of rib cage as well as to both sides of sternum.   Neurological: She is alert and oriented to person, place, and time.  Skin: Skin is warm and dry.  Nursing note and vitals reviewed.         Assessment & Plan:  Rib pain - Plan: DG Chest 2 View  Chest x-ray today, if normal, most likely costochondritis - at that time may treat  with aleve two pills bid x 7-10 days. F/u if symptoms persist or worsen, warning signs discussed.   Dr. Mickie Hillier was consulted on this visit, he also examined the patient and is in agreement with the above plan.

## 2018-01-14 ENCOUNTER — Ambulatory Visit: Payer: BLUE CROSS/BLUE SHIELD

## 2018-01-15 ENCOUNTER — Ambulatory Visit: Payer: BLUE CROSS/BLUE SHIELD | Admitting: Family Medicine

## 2018-01-15 ENCOUNTER — Encounter: Payer: Self-pay | Admitting: Family Medicine

## 2018-01-15 VITALS — BP 128/84 | Ht 67.5 in | Wt 200.6 lb

## 2018-01-15 DIAGNOSIS — Z79899 Other long term (current) drug therapy: Secondary | ICD-10-CM | POA: Diagnosis not present

## 2018-01-15 DIAGNOSIS — E785 Hyperlipidemia, unspecified: Secondary | ICD-10-CM | POA: Diagnosis not present

## 2018-01-15 DIAGNOSIS — Z794 Long term (current) use of insulin: Secondary | ICD-10-CM

## 2018-01-15 DIAGNOSIS — I10 Essential (primary) hypertension: Secondary | ICD-10-CM

## 2018-01-15 DIAGNOSIS — E119 Type 2 diabetes mellitus without complications: Secondary | ICD-10-CM | POA: Diagnosis not present

## 2018-01-15 LAB — POCT GLYCOSYLATED HEMOGLOBIN (HGB A1C): Hemoglobin A1C: 7.4 % — AB (ref 4.0–5.6)

## 2018-01-15 MED ORDER — LISINOPRIL 2.5 MG PO TABS: ORAL_TABLET | ORAL | 5 refills | Status: DC

## 2018-01-15 MED ORDER — INSULIN GLARGINE 100 UNIT/ML SOLOSTAR PEN: PEN_INJECTOR | SUBCUTANEOUS | 5 refills | Status: DC

## 2018-01-15 MED ORDER — METFORMIN HCL 500 MG PO TABS: 500.0000 mg | ORAL_TABLET | Freq: Two times a day (BID) | ORAL | 1 refills | Status: DC

## 2018-01-15 MED ORDER — ATORVASTATIN CALCIUM 10 MG PO TABS: 10.0000 mg | ORAL_TABLET | Freq: Every day | ORAL | 5 refills | Status: DC

## 2018-01-15 NOTE — Progress Notes (Signed)
Subjective:    [name], female    DOB: 05/03/1970, 47 y.o.   MRN: [id number]  Diabetes  She presents for her follow-up diabetic visit. She has type 2 diabetes mellitus. There are no hypoglycemic associated symptoms. Pertinent negatives for hypoglycemia include no dizziness. Pertinent negatives for diabetes include no chest pain, no polydipsia, no polyphagia and no polyuria. (Pt states sometimes she has problems with vision but she does have glasses but does not wear them) There are no hypoglycemic complications. There are no diabetic complications.   She is compliant with her diabetes medications. Taking glipizide 1/2 tablet bid. Taking metformin 1000 mg once daily. Taking lantus 70 units at bedtime. Denies adverse effects. Not checking blood sugars at home. Reports 1 episode at night time woke up feeling sweaty and checked her blood sugar and it was 60, ate a snack and went back to bed, this was back in the Summer, has not had any other incidences of hypoglycemia.   She has not started taking the atorvastatin yet for cholesterol. Has been several months since she has taken the lisinopril.   Reports pap smear done at [facility] this year was normal. Mammogram done this year as well and was normal.   Results for orders placed or performed in visit on 01/15/18  POCT HgB A1C  Result Value Ref Range   Hemoglobin A1C 7.4 (A) 4.0 - 5.6 %   HbA1c POC (<> result, manual entry)     HbA1c, POC (prediabetic range)     HbA1c, POC (controlled diabetic range)       Review of Systems  Eyes: Negative for visual disturbance.  Respiratory: Negative for shortness of breath.   Cardiovascular: Negative for chest pain and leg swelling.  Endocrine: Negative for polydipsia, polyphagia and polyuria.  Neurological: Negative for dizziness, syncope and light-headedness.       Objective:   Physical Exam  Constitutional: She is oriented to person, place, and time. She appears  well-developed and well-nourished. No distress.  HENT:  Head: Normocephalic and atraumatic.  Eyes: Pupils are equal, round, and reactive to light. Right eye exhibits no discharge. Left eye exhibits no discharge.  Neck: Neck supple.  Cardiovascular: Normal rate, regular rhythm and normal heart sounds.  No murmur heard. Pulmonary/Chest: Effort normal and breath sounds normal. No respiratory distress. She has no wheezes.  Lymphadenopathy:    She has no cervical adenopathy.  Neurological: She is alert and oriented to person, place, and time.  Skin: Skin is warm and dry.  Psychiatric: She has a normal mood and affect.  Nursing note and vitals reviewed.      Assessment & Plan:  Type 2 diabetes mellitus without complication, with long-term current use of insulin ([place]) - Plan: POCT HgB A1C The patient was seen today as part of a comprehensive visit for diabetes. Discussed the importance of keeping A1c at or below 7, as well as the importance of adherence to medication as prescribed.   Encouraged regular physical activity as well as a controlled low starch/sugar diet.   Standard follow-up visit recommended.  Patient aware that failure to keep diabetes under control increases the risk of complications. Encouraged her to start atorvastatin and restart her lisinopril. Continue her glipizide and lantus. We will change her metformin to 500 mg bid instead of the 1000 mg once daily. Refills sent in. We will get a lipid panel and hepatic function panel in 3 months. F/u in 6 months. At that visit we  will get a urine microalbumin.  Encouraged eye exam and home blood sugar monitoring Will get flu shot later this month with daughter  Dr. Richardson Landry was consulted on this visit and in agreement with the above plan.  25 minutes was spent with the patient.  This statement verifies that 25 minutes was indeed spent with the patient.  More than 50% of this visit-total duration of the visit-was spent in counseling and  coordination of care. The issues that the patient came in for today as reflected in the diagnosis (s) please refer to documentation for further details.

## 2018-01-15 NOTE — Patient Instructions (Addendum)
Need to get your labs drawn around January 10th.  Diabetes Mellitus and Nutrition When you have diabetes (diabetes mellitus), it is very important to have healthy eating habits because your blood sugar (glucose) levels are greatly affected by what you eat and drink. Eating healthy foods in the appropriate amounts, at about the same times every day, can help you:  Control your blood glucose.  Lower your risk of heart disease.  Improve your blood pressure.  Reach or maintain a healthy weight.  Every person with diabetes is different, and each person has different needs for a meal plan. Your health care provider may recommend that you work with a diet and nutrition specialist (dietitian) to make a meal plan that is best for you. Your meal plan may vary depending on factors such as:  The calories you need.  The medicines you take.  Your weight.  Your blood glucose, blood pressure, and cholesterol levels.  Your activity level.  Other health conditions you have, such as heart or kidney disease.  How do carbohydrates affect me? Carbohydrates affect your blood glucose level more than any other type of food. Eating carbohydrates naturally increases the amount of glucose in your blood. Carbohydrate counting is a method for keeping track of how many carbohydrates you eat. Counting carbohydrates is important to keep your blood glucose at a healthy level, especially if you use insulin or take certain oral diabetes medicines. It is important to know how many carbohydrates you can safely have in each meal. This is different for every person. Your dietitian can help you calculate how many carbohydrates you should have at each meal and for snack. Foods that contain carbohydrates include:  Bread, cereal, rice, pasta, and crackers.  Potatoes and corn.  Peas, beans, and lentils.  Milk and yogurt.  Fruit and juice.  Desserts, such as cakes, cookies, ice cream, and candy.  How does alcohol  affect me? Alcohol can cause a sudden decrease in blood glucose (hypoglycemia), especially if you use insulin or take certain oral diabetes medicines. Hypoglycemia can be a life-threatening condition. Symptoms of hypoglycemia (sleepiness, dizziness, and confusion) are similar to symptoms of having too much alcohol. If your health care provider says that alcohol is safe for you, follow these guidelines:  Limit alcohol intake to no more than 1 drink per day for nonpregnant women and 2 drinks per day for men. One drink equals 12 oz of beer, 5 oz of wine, or 1 oz of hard liquor.  Do not drink on an empty stomach.  Keep yourself hydrated with water, diet soda, or unsweetened iced tea.  Keep in mind that regular soda, juice, and other mixers may contain a lot of sugar and must be counted as carbohydrates.  What are tips for following this plan? Reading food labels  Start by checking the serving size on the label. The amount of calories, carbohydrates, fats, and other nutrients listed on the label are based on one serving of the food. Many foods contain more than one serving per package.  Check the total grams (g) of carbohydrates in one serving. You can calculate the number of servings of carbohydrates in one serving by dividing the total carbohydrates by 15. For example, if a food has 30 g of total carbohydrates, it would be equal to 2 servings of carbohydrates.  Check the number of grams (g) of saturated and trans fats in one serving. Choose foods that have low or no amount of these fats.  Check the number  of milligrams (mg) of sodium in one serving. Most people should limit total sodium intake to less than 2,300 mg per day.  Always check the nutrition information of foods labeled as "low-fat" or "nonfat". These foods may be higher in added sugar or refined carbohydrates and should be avoided.  Talk to your dietitian to identify your daily goals for nutrients listed on the  label. Shopping  Avoid buying canned, premade, or processed foods. These foods tend to be high in fat, sodium, and added sugar.  Shop around the outside edge of the grocery store. This includes fresh fruits and vegetables, bulk grains, fresh meats, and fresh dairy. Cooking  Use low-heat cooking methods, such as baking, instead of high-heat cooking methods like deep frying.  Cook using healthy oils, such as olive, canola, or sunflower oil.  Avoid cooking with butter, cream, or high-fat meats. Meal planning  Eat meals and snacks regularly, preferably at the same times every day. Avoid going long periods of time without eating.  Eat foods high in fiber, such as fresh fruits, vegetables, beans, and whole grains. Talk to your dietitian about how many servings of carbohydrates you can eat at each meal.  Eat 4-6 ounces of lean protein each day, such as lean meat, chicken, fish, eggs, or tofu. 1 ounce is equal to 1 ounce of meat, chicken, or fish, 1 egg, or 1/4 cup of tofu.  Eat some foods each day that contain healthy fats, such as avocado, nuts, seeds, and fish. Lifestyle   Check your blood glucose regularly.  Exercise at least 30 minutes 5 or more days each week, or as told by your health care provider.  Take medicines as told by your health care provider.  Do not use any products that contain nicotine or tobacco, such as cigarettes and e-cigarettes. If you need help quitting, ask your health care provider.  Work with a Social worker or diabetes educator to identify strategies to manage stress and any emotional and social challenges. What are some questions to ask my health care provider?  Do I need to meet with a diabetes educator?  Do I need to meet with a dietitian?  What number can I call if I have questions?  When are the best times to check my blood glucose? Where to find more information:  American Diabetes Association: diabetes.org/food-and-fitness/food  Academy of  Nutrition and Dietetics: PokerClues.dk  [employer] of Diabetes and Digestive and Kidney Diseases (NIH): ContactWire.be Summary  A healthy meal plan will help you control your blood glucose and maintain a healthy lifestyle.  Working with a diet and nutrition specialist (dietitian) can help you make a meal plan that is best for you.  Keep in mind that carbohydrates and alcohol have immediate effects on your blood glucose levels. It is important to count carbohydrates and to use alcohol carefully. This information is not intended to replace advice given to you by your health care provider. Make sure you discuss any questions you have with your health care provider. Document Released: 12/20/2004 Document Revised: 04/29/2016 Document Reviewed: 04/29/2016 Elsevier Interactive Patient Education  [employer].

## 2018-01-28 ENCOUNTER — Ambulatory Visit (INDEPENDENT_AMBULATORY_CARE_PROVIDER_SITE_OTHER): Payer: BLUE CROSS/BLUE SHIELD

## 2018-01-28 DIAGNOSIS — Z23 Encounter for immunization: Secondary | ICD-10-CM

## 2018-02-06 ENCOUNTER — Other Ambulatory Visit: Payer: Self-pay | Admitting: Family Medicine

## 2018-02-23 ENCOUNTER — Encounter: Payer: Self-pay | Admitting: Family Medicine

## 2018-02-23 ENCOUNTER — Ambulatory Visit: Payer: BLUE CROSS/BLUE SHIELD | Admitting: Family Medicine

## 2018-02-23 VITALS — BP 130/90 | HR 114 | Temp 97.5°F | Ht 67.5 in | Wt 204.0 lb

## 2018-02-23 DIAGNOSIS — R000 Tachycardia, unspecified: Secondary | ICD-10-CM | POA: Diagnosis not present

## 2018-02-23 DIAGNOSIS — J019 Acute sinusitis, unspecified: Secondary | ICD-10-CM | POA: Diagnosis not present

## 2018-02-23 MED ORDER — AMOXICILLIN 500 MG PO CAPS: 500.0000 mg | ORAL_CAPSULE | Freq: Three times a day (TID) | ORAL | 0 refills | Status: AC

## 2018-02-23 MED ORDER — FAMOTIDINE 20 MG PO TABS: 20.0000 mg | ORAL_TABLET | Freq: Two times a day (BID) | ORAL | 0 refills | Status: DC

## 2018-02-23 NOTE — Progress Notes (Signed)
   Subjective:    [name], female    DOB: September 21, 1970, 47 y.o.   MRN: [id number]  HPI  Patient is here today with complaints of a productive cough with spitting up green phlegm,popping noise in her throat all ongoing for the last two weeks. She was seen in August for this and was told it was her reflux. She is having some shortness of breath and wheezing.She had been taking Ranitidine 150 mg one bid, now it has been recalled.  Popping noise in throat x 2 weeks, occurring mostly at night and first thing in the morning. Had not taken any of the ranitidine. Tried taking tums, but did not help. Denies any reflux symptoms. States also coughing and coughing up green phlegm. Also having some shortness of breath. Denies fever. No N/V or abdominal pain.   Also reports blowing out some black stuff from her nose once 2-3 weeks ago after smelling something in her car.   Review of Systems  Constitutional: Negative for fever.  HENT: Positive for congestion and ear pain (ear fullness). Negative for sinus pressure, sinus pain, sore throat and trouble swallowing.   Eyes: Negative for discharge.  Respiratory: Positive for cough and shortness of breath. Negative for choking.   Cardiovascular: Negative for chest pain and palpitations.  Gastrointestinal: Negative for abdominal pain, diarrhea, nausea and vomiting.       Objective:   Physical Exam  Constitutional: She is oriented to person, place, and time. She appears well-developed and well-nourished. No distress.  HENT:  Head: Normocephalic and atraumatic.  Right Ear: Tympanic membrane normal.  Left Ear: Tympanic membrane normal.  Nose: Nose normal. No sinus tenderness.  Mouth/Throat: Uvula is midline and oropharynx is clear and moist.  Eyes: Right eye exhibits no discharge. Left eye exhibits no discharge.  Neck: Neck supple.  Cardiovascular: Regular rhythm and normal heart sounds.  No murmur heard. Pulmonary/Chest: Effort normal  and breath sounds normal. No respiratory distress. She has no wheezes. She has no rales.  Lymphadenopathy:    She has no cervical adenopathy.  Neurological: She is alert and oriented to person, place, and time.  Skin: Skin is warm and dry.  Nursing note and vitals reviewed.     Assessment & Plan:  1. Acute rhinosinusitis Will treat with abx given duration of productive cough. "popping" noise in throat could be from drainage. Since she has taken ranitidine for this in the past, recommend treatment with pepcid instead given the recall. Will trial this x 2 weeks. If popping noise persists, or if any of her symptoms worsen or fail to improve recommend she schedule f/u office visit for further evaluation.   2. Tachycardia Likely r/t her sickness, educated patient on how to check her pulse at home, if after she is feeling well and finished with abx her HR is still above 100 recommend she schedule f/u office visit for further evaluation.   Dr. Sallee Lange was consulted on this case and is in agreement with the above treatment plan.

## 2018-02-26 ENCOUNTER — Encounter: Payer: Self-pay | Admitting: Family Medicine

## 2018-02-26 ENCOUNTER — Ambulatory Visit: Payer: BLUE CROSS/BLUE SHIELD | Admitting: Family Medicine

## 2018-02-26 VITALS — BP 120/92 | HR 100 | Ht 67.5 in | Wt 203.0 lb

## 2018-02-26 DIAGNOSIS — E119 Type 2 diabetes mellitus without complications: Secondary | ICD-10-CM

## 2018-02-26 DIAGNOSIS — Z23 Encounter for immunization: Secondary | ICD-10-CM

## 2018-02-26 DIAGNOSIS — T148XXA Other injury of unspecified body region, initial encounter: Secondary | ICD-10-CM

## 2018-02-26 DIAGNOSIS — M79661 Pain in right lower leg: Secondary | ICD-10-CM | POA: Diagnosis not present

## 2018-02-26 DIAGNOSIS — M79674 Pain in right toe(s): Secondary | ICD-10-CM | POA: Diagnosis not present

## 2018-02-26 NOTE — Progress Notes (Signed)
   Subjective:    [name], female    DOB: [birth year], 47 y.o.   MRN: [id number]  HPI Patient is here today due to a cut on her leg and right great toe pain,Toe pain ongoing for two weeks and she shut her leg into the car door and now there is a cut on the right shin.She is diabetic and concerned about both of these things.  Reports right big toe pain x 2 weeks. Reports toe pain is intermittent. Feels like she can't get in under the nail to clean it very well.  Also reports hitting her right leg on her car door and has a small cut. Wants to get it checked out.   Review of Systems  Constitutional: Negative for fever.  Skin: Positive for wound. Negative for color change.       Negative for purulent discharge.       Objective:   Physical Exam  Constitutional: She is oriented to person, place, and time. She appears well-developed and well-nourished. No distress.  HENT:  Head: Normocephalic and atraumatic.  Cardiovascular: Normal rate, regular rhythm, normal heart sounds and intact distal pulses.  Pulmonary/Chest: Effort normal and breath sounds normal. No respiratory distress.  Neurological: She is alert and oriented to person, place, and time.  Skin: Skin is warm and dry.  Small abrasion noted to anterior lower right leg, appears to be healing well, no sign of infection. Right big toe with swelling and darkening of the skin medial to the nail, no sign of infection, no purulent drainage noted.  Psychiatric: She has a normal mood and affect.  Nursing note and vitals reviewed.     Assessment & Plan:  1. Great toe pain, rightPlan: Ambulatory referral to Podiatry Will refer to podiatry for further evaluation.  Patient requesting Dr. Berline Lopes in [place].  2. Abrasion  Appears to be healing well, recommend over-the-counter antibiotic ointment and to keep area clean.  Follow-up if symptoms worsen develops fever redness purulent drainage.  Will update patient's Tdap.  Need  for vaccination   Dr. Mickie Hillier was consulted on this case and is in agreement with the above treatment plan.

## 2018-03-12 ENCOUNTER — Ambulatory Visit: Payer: BLUE CROSS/BLUE SHIELD | Admitting: Family Medicine

## 2018-03-12 ENCOUNTER — Encounter: Payer: Self-pay | Admitting: Family Medicine

## 2018-03-12 VITALS — BP 134/94 | HR 100 | Wt 206.0 lb

## 2018-03-12 DIAGNOSIS — R9431 Abnormal electrocardiogram [ECG] [EKG]: Secondary | ICD-10-CM

## 2018-03-12 DIAGNOSIS — R0609 Other forms of dyspnea: Secondary | ICD-10-CM | POA: Diagnosis not present

## 2018-03-12 DIAGNOSIS — R0600 Dyspnea, unspecified: Secondary | ICD-10-CM

## 2018-03-12 DIAGNOSIS — R5383 Other fatigue: Secondary | ICD-10-CM | POA: Diagnosis not present

## 2018-03-12 NOTE — Progress Notes (Signed)
   Subjective:    Patient ID: [name], female    DOB: 1970/04/15, 47 y.o.   MRN: [id number]  HPI  Patient states she is here today with complaints of Tara Bryant. She says she still is having a popping noise in her throat and is still have some shortness of breath.She just does not feel good.She states she has not felt this bad in a long time.The last few nights she has had dry heaving. She tried to cut back on caffeine and tomato based products. Has been taking pepcid bid, denies heartburn/reflux.  Reports finished antibiotic from 02/23/18, not coughing up green mucous any longer, but still with runny nose and PND, but is clear.   Waking up several times at night because she is hearing a popping noise in the back of her throat, has to go sleep in the recliner.   Feels like she is more short of breath than usual, was out of breath just taking the trash cans out with brief episode of right-sided chest pain lasting only a few seconds. States she just feels worn out.   Review of Systems  Constitutional: Positive for appetite change and fatigue.  HENT: Positive for postnasal drip and rhinorrhea.   Respiratory: Positive for shortness of breath. Negative for cough.   Gastrointestinal: Positive for nausea. Negative for blood in stool, constipation, diarrhea and vomiting.       Objective:   Physical Exam  Constitutional: She is oriented to person, place, and time. She appears well-developed and well-nourished. No distress.  HENT:  Head: Normocephalic and atraumatic.  Right Ear: Tympanic membrane normal.  Nose: Nose normal.  Mouth/Throat: Uvula is midline and oropharynx is clear and moist.  Eyes: Right eye exhibits no discharge. Left eye exhibits no discharge.  Neck: Neck supple.  Cardiovascular: Normal rate, regular rhythm and normal heart sounds.  No murmur heard. Pulmonary/Chest: Effort normal and breath sounds normal. No respiratory distress.  Abdominal: Soft. Bowel sounds are normal.  She exhibits no distension and no mass. There is no tenderness.  Lymphadenopathy:    She has no cervical adenopathy.  Neurological: She is alert and oriented to person, place, and time.  Skin: Skin is warm and dry.  Nursing note and vitals reviewed.     Assessment & Plan:  1. Fatigue, unspecified type - Plan: Basic metabolic panel, TSH, CBC with Differential/Platelet, D-dimer, quantitative (not at [facility]), EKG 12-Lead  2. Dyspnea on exertion - Plan: Basic metabolic panel, TSH, CBC with Differential/Platelet, D-dimer, quantitative (not at [facility]), EKG 12-Lead  3. Abnormal EKG  EKG today showing some abnormalities with occasional PVC and t-wave inversion, reviewed by myself and Dr. Sallee Lange. Recommend ECHO and referral to cardiology for further evaluation of this and her dyspnea on exertion.   Lab work ordered and will f/u based on results with Dr. Richardson Landry.  25 minutes was spent with the patient.  This statement verifies that 25 minutes was indeed spent with the patient.  More than 50% of this visit-total duration of the visit-was spent in counseling and coordination of care. The issues that the patient came in for today as reflected in the diagnosis (s) please refer to documentation for further details.

## 2018-03-13 ENCOUNTER — Telehealth: Payer: Self-pay | Admitting: Family Medicine

## 2018-03-13 ENCOUNTER — Ambulatory Visit (HOSPITAL_COMMUNITY)
Admission: RE | Admit: 2018-03-13 | Discharge: 2018-03-13 | Disposition: A | Discharge status: Home / Self Care | Payer: BLUE CROSS/BLUE SHIELD | Source: Ambulatory Visit | Attending: Family Medicine | Admitting: Family Medicine

## 2018-03-13 ENCOUNTER — Other Ambulatory Visit: Payer: Self-pay

## 2018-03-13 DIAGNOSIS — R7989 Other specified abnormal findings of blood chemistry: Secondary | ICD-10-CM | POA: Diagnosis not present

## 2018-03-13 DIAGNOSIS — E041 Nontoxic single thyroid nodule: Secondary | ICD-10-CM

## 2018-03-13 LAB — BASIC METABOLIC PANEL
BUN/Creatinine Ratio: 17 (ref 9–23)
BUN: 15 mg/dL (ref 6–24)
CO2: 20 mmol/L (ref 20–29)
Calcium: 9.3 mg/dL (ref 8.7–10.2)
Chloride: 101 mmol/L (ref 96–106)
Creatinine, Ser: 0.86 mg/dL (ref 0.57–1.00)
GFR calc Af Amer: 92 mL/min/{1.73_m2} (ref >59)
GFR calc non Af Amer: 80 mL/min/{1.73_m2} (ref >59)
Glucose: 169 mg/dL — ABNORMAL HIGH (ref 65–99)
Potassium: 4.8 mmol/L (ref 3.5–5.2)
Sodium: 137 mmol/L (ref 134–144)

## 2018-03-13 LAB — CBC WITH DIFFERENTIAL/PLATELET
Basophils Absolute: 0.1 10*3/uL (ref 0.0–0.2)
Basos: 1 %
EOS (ABSOLUTE): 0.1 10*3/uL (ref 0.0–0.4)
Eos: 1 %
Hematocrit: 38.1 % (ref 34.0–46.6)
Hemoglobin: 13 g/dL (ref 11.1–15.9)
Immature Grans (Abs): 0 10*3/uL (ref 0.0–0.1)
Immature Granulocytes: 0 %
Lymphocytes Absolute: 2.7 10*3/uL (ref 0.7–3.1)
Lymphs: 26 %
MCH: 30.6 pg (ref 26.6–33.0)
MCHC: 34.1 g/dL (ref 31.5–35.7)
MCV: 90 fL (ref 79–97)
Monocytes Absolute: 0.6 10*3/uL (ref 0.1–0.9)
Monocytes: 6 %
Neutrophils Absolute: 6.8 10*3/uL (ref 1.4–7.0)
Neutrophils: 66 %
Platelets: 321 10*3/uL (ref 150–450)
RBC: 4.25 x10E6/uL (ref 3.77–5.28)
RDW: 12.2 % — ABNORMAL LOW (ref 12.3–15.4)
WBC: 10.4 10*3/uL (ref 3.4–10.8)

## 2018-03-13 LAB — TSH: TSH: 2.13 u[IU]/mL (ref 0.450–4.500)

## 2018-03-13 LAB — D-DIMER, QUANTITATIVE (NOT AT ARMC): D-DIMER: 1.17 mg/L FEU — ABNORMAL HIGH (ref 0.00–0.49)

## 2018-03-13 MED ORDER — IOPAMIDOL (ISOVUE-370) INJECTION 76%
75.0000 mL | Freq: Once | INTRAVENOUS | Status: AC | PRN
  Administered 2018-03-13: 75 mL via INTRAVENOUS

## 2018-03-13 MED ORDER — FUROSEMIDE 20 MG PO TABS: 20.0000 mg | ORAL_TABLET | Freq: Every day | ORAL | 5 refills | Status: DC

## 2018-03-13 NOTE — Addendum Note (Signed)
Addended by: Karle Barr on: 03/13/2018 04:09 PM   Modules accepted: Orders

## 2018-03-13 NOTE — Telephone Encounter (Signed)
I did speak with the patient regarding the results of her CT  Nurses please do the following Lasix 20 mg every morning, #30, 5 refills The patient is aware that we are sending this in  Please have the front schedule her with Dr. Richardson Landry Thursday or Friday of next week for recheck please have the front notify the patient of this appointment  Set up patient for ultrasound of the thyroid due to thyroid nodule  Please connect with [facility] to see if they can add BNP to her current lab work Reason- DOE, pulmonary edema

## 2018-03-13 NOTE — Telephone Encounter (Signed)
Lasix sent in to [facility], pt is aware .She is aware she will need a Thyroid U/s, this order was placed in epic. Pt was transferred to the front to set up a follow up appt with Dr.Steve next week. I added BNP to her labs she had done yesterday,spoke with VIVI at [place].

## 2018-03-14 ENCOUNTER — Telehealth: Payer: Self-pay | Admitting: Family Medicine

## 2018-03-14 LAB — SPECIMEN STATUS REPORT

## 2018-03-14 NOTE — Telephone Encounter (Signed)
I did talk with the patient regarding her CT scan in more detail She relates that she sleeps on 2 pillows and it sometimes she has to get up in the middle night to sleep in a recliner because of popping noises in her throat but she denies any severe shortness of breath She denies any swelling in her ankles  I instructed her to do the following Lasix 20 mg each morning Lasix 20 mg 1 at noon on Saturday and Sunday Lisinopril 2.5 mg, increase it to 2 tablets daily Metformin-hold until she sees Dr. Richardson Landry  Echo on Tuesday as planned Appointment with Dr. Richardson Landry on Thursday as planned More than likely will have to have metabolic 7 on Thursday May have a banana a day

## 2018-03-15 ENCOUNTER — Telehealth: Payer: Self-pay | Admitting: Family Medicine

## 2018-03-15 DIAGNOSIS — J810 Acute pulmonary edema: Secondary | ICD-10-CM

## 2018-03-15 NOTE — Telephone Encounter (Signed)
Please nurses  Connect with the patient- she is scheduled to do a echo on Tuesday follow-up office visit with Dr. Richardson Landry on Thursday Please have her do a metabolic 7 with BNP on Tuesday when she goes to do her echo so that way the results of that will be back by the time she comes on Thursday thank you  She can do that lab work at [facility]

## 2018-03-16 NOTE — Addendum Note (Signed)
Addended by: Dairl Ponder on: 03/16/2018 10:08 AM   Modules accepted: Orders

## 2018-03-16 NOTE — Telephone Encounter (Signed)
Blood work ordered in [facility]. Patient notified and will have blood work done tomorrow.

## 2018-03-17 ENCOUNTER — Other Ambulatory Visit: Payer: Self-pay | Admitting: *Deleted

## 2018-03-17 ENCOUNTER — Ambulatory Visit (INDEPENDENT_AMBULATORY_CARE_PROVIDER_SITE_OTHER): Payer: BLUE CROSS/BLUE SHIELD | Admitting: Cardiology

## 2018-03-17 ENCOUNTER — Ambulatory Visit (HOSPITAL_COMMUNITY)
Admission: RE | Admit: 2018-03-17 | Discharge: 2018-03-17 | Disposition: A | Discharge status: Home / Self Care | Payer: BLUE CROSS/BLUE SHIELD | Source: Ambulatory Visit | Attending: Family Medicine | Admitting: Family Medicine

## 2018-03-17 ENCOUNTER — Telehealth: Payer: Self-pay | Admitting: Family Medicine

## 2018-03-17 ENCOUNTER — Encounter: Payer: Self-pay | Admitting: Cardiology

## 2018-03-17 VITALS — BP 135/102 | HR 133 | Ht 67.5 in | Wt 200.2 lb

## 2018-03-17 DIAGNOSIS — R5383 Other fatigue: Secondary | ICD-10-CM | POA: Diagnosis not present

## 2018-03-17 DIAGNOSIS — R0609 Other forms of dyspnea: Secondary | ICD-10-CM | POA: Insufficient documentation

## 2018-03-17 DIAGNOSIS — I5021 Acute systolic (congestive) heart failure: Secondary | ICD-10-CM

## 2018-03-17 DIAGNOSIS — R9431 Abnormal electrocardiogram [ECG] [EKG]: Secondary | ICD-10-CM | POA: Diagnosis present

## 2018-03-17 DIAGNOSIS — R0600 Dyspnea, unspecified: Secondary | ICD-10-CM

## 2018-03-17 MED ORDER — METOPROLOL SUCCINATE ER 25 MG PO TB24: 12.5000 mg | ORAL_TABLET | Freq: Every day | ORAL | 1 refills | Status: DC

## 2018-03-17 MED ORDER — FUROSEMIDE 40 MG PO TABS: 40.0000 mg | ORAL_TABLET | Freq: Every day | ORAL | 1 refills | Status: DC

## 2018-03-17 MED ORDER — BENZONATATE 100 MG PO CAPS: 100.0000 mg | ORAL_CAPSULE | Freq: Three times a day (TID) | ORAL | 0 refills | Status: DC | PRN

## 2018-03-17 NOTE — Telephone Encounter (Signed)
Pt is calling to make Dr. Richardson Landry aware she got her echo and lab work done today. She also has been coughing since Saturday really bad and she doesn't know what is causing that or what to take for it. She has an appt for Thursday but doesn't know with the cough if she can wait until then. She did have a fever a little over a 100 last night. If something is called in please send to [place], [place]. HARRISON S.  CB# [phone]

## 2018-03-17 NOTE — Patient Instructions (Signed)
Your physician recommends that you schedule a follow-up appointment in: [place] has recommended you make the following change in your medication:   START TOPROL XL 12.5 MG (1/2 TABLET) DAILY   INCREASE LASIX 40 MG DAILY   Thank you for choosing [facility]!!

## 2018-03-17 NOTE — Telephone Encounter (Signed)
Patient is aware 

## 2018-03-17 NOTE — Telephone Encounter (Signed)
Left message to return call 

## 2018-03-17 NOTE — Telephone Encounter (Signed)
Tara Bryant perles 100 mg 24 one tid for cough, symptom care for  Fever, if bad headache and muscle aches kick in , and fever worsens, rec call bk and we will add fl;u med, test results not bk yet, will disc at f u visit,

## 2018-03-17 NOTE — Telephone Encounter (Signed)
Med sent to pharm. Left message to return call to discuss with pt message below

## 2018-03-17 NOTE — Progress Notes (Signed)
*  PRELIMINARY RESULTS* Echocardiogram 2D Echocardiogram has been performed.  Tara Bryant 03/17/2018, 11:06 AM

## 2018-03-17 NOTE — Progress Notes (Signed)
Clinical Summary Ms. Brandi is a 47 y.o.female seen as new consult, referred by Dr Wolfgang Phoenix for systolic heart failure  1. Acute systolic HF - recent issues with increaseing DOE and orthopnea - 03/2018 echo LVEF 20% - EKG SR, nonspecific ST/T chagnes - normal TSH - 03/2018 CT PE without PE, + pulmonary edema   - starting October diffuse aches in chest, muscle aches. Worst the palpation - end of October early November treated for rhinosinusitis. DOE with activities, example bringing trash cans in - SOB was worsening over time. Severe DOE walking up 1 flight of stairs, or walking to car from parking lot - no significant chest pain. Has had progressive cough - no LE edema, + abdominal distension. Can have some orthopnea.  - lasix started by pcp just a few days ago  EtOH: no EtoH, no drugs Several family members on mother side with heart disease, she is unsure of details. Brother with stent in his 3s.  CAD risk factors: DM2, brother with stent,   Recent labs show Cr 0.86, K 4.8, TSH 2.13, WBC 10.4 Hgb 13     2. Tachycardia - notes indicate heart rates often in low 100s    Past Medical History:  Diagnosis Date  . Arrhythmia   . Depression   . Diabetes mellitus   . Hypertension   . Microproteinuria      Allergies  Allergen Reactions  . Aspartame And Phenylalanine      Current Outpatient Medications  Medication Sig Dispense Refill  . atorvastatin (LIPITOR) 10 MG tablet Take 1 tablet (10 mg total) by mouth daily. For cholesterol 30 tablet 5  . B-D ULTRAFINE III SHORT PEN 31G X 8 MM MISC Use twice a daily to inject insulin 100 each 5  . B-D ULTRAFINE III SHORT PEN 31G X 8 MM MISC USE FOR INSULIN INJECTIONS TWICE DAILY 100 each 2  . benzonatate (TESSALON) 100 MG capsule Take 1 capsule (100 mg total) by mouth 3 (three) times daily as needed for cough. 24 capsule 0  . CONTOUR NEXT TEST test strip USE TO TEST BLOOD SUGAR TWICE DAILY AS DIRECTED 100 each 2  .  famotidine (PEPCID) 20 MG tablet Take 1 tablet (20 mg total) by mouth 2 (two) times daily. 60 tablet 0  . furosemide (LASIX) 20 MG tablet Take 1 tablet (20 mg total) by mouth daily. 30 tablet 5  . glipiZIDE (GLUCOTROL) 5 MG tablet TAKE 1 TABLET(5 MG) BY MOUTH DAILY (Patient taking differently: TAKE 1 TABLET(5 MG) BY MOUTH DAILY > Patient states she takes 1/2 BID.) 30 tablet 5  . Insulin Glargine (LANTUS SOLOSTAR) 100 UNIT/ML Solostar Pen ADMINISTER 70 UNITS UNDER THE SKIN EVERY NIGHT AT BEDTIME 15 mL 5  . ketoconazole (NIZORAL) 2 % cream Apply 1 application topically 2 (two) times daily. 30 g 0  . lisinopril (PRINIVIL,ZESTRIL) 2.5 MG tablet TAKE 1 TABLET(2.5 MG) BY MOUTH DAILY 30 tablet 5  . metFORMIN (GLUCOPHAGE) 500 MG tablet Take 1 tablet (500 mg total) by mouth 2 (two) times daily with a meal. 180 tablet 1  . naproxen (NAPROSYN) 500 MG tablet Take 1 tablet (500 mg total) by mouth 2 (two) times daily with a meal. 30 tablet 0  . NORLYDA 0.35 MG tablet TK 1 T PO D  12  . terconazole (TERAZOL 7) 0.4 % vaginal cream Place 1 applicator vaginally at bedtime. x7d 45 g 0  . triamcinolone (KENALOG) 0.1 % paste Use as directed 1 application in  the mouth or throat 2 (two) times daily. Apply a small amount to lesions in the mouth BID up to 7-10 days 5 g 0   No current facility-administered medications for this visit.      Past Surgical History:  Procedure Laterality Date  . BACK SURGERY    . BREAST BIOPSY Left 2017   benign  . NOSE SURGERY    . TONSILLECTOMY    . WISDOM TOOTH EXTRACTION       Allergies  Allergen Reactions  . Aspartame And Phenylalanine       Family History  Problem Relation Age of Onset  . Stroke Sister 53       smoker, diabetic, HTN  . Breast cancer Sister   . Cancer Mother        breast  . Breast cancer Mother   . Diabetes Other   . Heart attack Other      Social History Ms. Mill reports that she has never smoked. She has never used smokeless  tobacco. Ms. Vogan reports that she does not drink alcohol.   Review of Systems CONSTITUTIONAL: No weight loss, fever, chills, weakness or fatigue.  HEENT: Eyes: No visual loss, blurred vision, double vision or yellow sclerae.No hearing loss, sneezing, congestion, runny nose or sore throat.  SKIN: No rash or itching.  CARDIOVASCULAR: per hpi RESPIRATORY: per hpi GASTROINTESTINAL: No anorexia, nausea, vomiting or diarrhea. No abdominal pain or blood.  GENITOURINARY: No burning on urination, no polyuria NEUROLOGICAL: No headache, dizziness, syncope, paralysis, ataxia, numbness or tingling in the extremities. No change in bowel or bladder control.  MUSCULOSKELETAL: No muscle, back pain, joint pain or stiffness.  LYMPHATICS: No enlarged nodes. No history of splenectomy.  PSYCHIATRIC: No history of depression or anxiety.  ENDOCRINOLOGIC: No reports of sweating, cold or heat intolerance. No polyuria or polydipsia.  Marland Kitchen   Physical Examination Vitals:   03/17/18 1543 03/17/18 1552  BP: 123/87 (!) 135/102  Pulse: (!) 128 (!) 133  SpO2: 100% 99%   Vitals:   03/17/18 1543  Weight: 200 lb 3.2 oz (90.8 kg)  Height: 5' 7.5" (1.715 m)    Gen: resting comfortably, no acute distress HEENT: no scleral icterus, pupils equal round and reactive, no palptable cervical adenopathy,  CV: RRR, no m/rg, no jvd Resp:mild crackles bilaterally GI: abdomen is soft, non-tender, non-distended, normal bowel sounds, no hepatosplenomegaly MSK: extremities are warm, 1+ bilateral edema Skin: warm, no rash Neuro:  no focal deficits Psych: appropriate affect   Diagnostic Studies 03/2018 echo LVEF 20%, diffuse hypokinesis, cannot eval diastolic function, mild RV dysfunction, PASP 47, small pericardial effusion    Assessment and Plan  1. Acute systolic HF - progressive DOE and orthopnea over the last 2 months.  - recent imaging confirmed pulmonary edema. Echo shows LVEF 20% - concerning acute systolic  HF with tachycardia. We discussed admission but due to family concerns she has turned down - we will start toprol 12.5mg  daily, increase lasix to 40mg  daily, continue her lisinopril but consider changing to entresto in near future.  - f/u with me this Friday, if worsening symptoms before then asked to present to the ER - will need LHC/RHC arranged in near future. Other considerations would be viral cardiomyopathy given recent infectious symptoms.     Arnoldo Lenis, M.D.

## 2018-03-18 LAB — BASIC METABOLIC PANEL
BUN/Creatinine Ratio: 10 (ref 9–23)
BUN: 9 mg/dL (ref 6–24)
CO2: 20 mmol/L (ref 20–29)
Calcium: 8.4 mg/dL — ABNORMAL LOW (ref 8.7–10.2)
Chloride: 97 mmol/L (ref 96–106)
Creatinine, Ser: 0.88 mg/dL (ref 0.57–1.00)
GFR calc Af Amer: 90 mL/min/{1.73_m2} (ref >59)
GFR calc non Af Amer: 78 mL/min/{1.73_m2} (ref >59)
Glucose: 314 mg/dL — ABNORMAL HIGH (ref 65–99)
Potassium: 4.4 mmol/L (ref 3.5–5.2)
Sodium: 134 mmol/L (ref 134–144)

## 2018-03-18 LAB — BRAIN NATRIURETIC PEPTIDE: BNP: 218.4 pg/mL — ABNORMAL HIGH (ref 0.0–100.0)

## 2018-03-19 ENCOUNTER — Ambulatory Visit (HOSPITAL_COMMUNITY)
Admission: RE | Admit: 2018-03-19 | Discharge: 2018-03-19 | Disposition: A | Discharge status: Home / Self Care | Payer: BLUE CROSS/BLUE SHIELD | Source: Ambulatory Visit | Attending: Family Medicine | Admitting: Family Medicine

## 2018-03-19 ENCOUNTER — Ambulatory Visit: Payer: BLUE CROSS/BLUE SHIELD | Admitting: Family Medicine

## 2018-03-19 DIAGNOSIS — E041 Nontoxic single thyroid nodule: Secondary | ICD-10-CM

## 2018-03-19 DIAGNOSIS — I517 Cardiomegaly: Secondary | ICD-10-CM | POA: Diagnosis not present

## 2018-03-19 LAB — SPECIMEN STATUS REPORT

## 2018-03-19 LAB — BRAIN NATRIURETIC PEPTIDE

## 2018-03-20 ENCOUNTER — Ambulatory Visit (INDEPENDENT_AMBULATORY_CARE_PROVIDER_SITE_OTHER): Payer: BLUE CROSS/BLUE SHIELD | Admitting: Cardiology

## 2018-03-20 ENCOUNTER — Other Ambulatory Visit: Payer: Self-pay | Admitting: Family Medicine

## 2018-03-20 ENCOUNTER — Encounter: Payer: Self-pay | Admitting: Cardiology

## 2018-03-20 ENCOUNTER — Other Ambulatory Visit: Payer: Self-pay | Admitting: Cardiology

## 2018-03-20 VITALS — BP 110/78 | HR 108 | Ht 67.5 in | Wt 196.0 lb

## 2018-03-20 DIAGNOSIS — I5021 Acute systolic (congestive) heart failure: Secondary | ICD-10-CM

## 2018-03-20 DIAGNOSIS — E041 Nontoxic single thyroid nodule: Secondary | ICD-10-CM

## 2018-03-20 MED ORDER — LOSARTAN POTASSIUM 25 MG PO TABS: 12.5000 mg | ORAL_TABLET | Freq: Every day | ORAL | 3 refills | Status: DC

## 2018-03-20 NOTE — Patient Instructions (Signed)
Medication Instructions:  STOP LISINOPRIL  START LOSARTAN 12.5 MG DAILY  Labwork: Monday CBC BMET MAGNESIUM   Testing/Procedures: Your physician has requested that you have a cardiac catheterization. Cardiac catheterization is used to diagnose and/or treat various heart conditions. Doctors may recommend this procedure for a number of different reasons. The most common reason is to evaluate chest pain. Chest pain can be a symptom of coronary artery disease (CAD), and cardiac catheterization can show whether plaque is narrowing or blocking your heart's arteries. This procedure is also used to evaluate the valves, as well as measure the blood flow and oxygen levels in different parts of your heart. For further information please visit [email or web address]. Please follow instruction sheet, as given.    Follow-Up: Your physician recommends that you schedule a follow-up appointment in: 3 WEEKS   Any Other Special Instructions Will Be Listed Below (If Applicable).     If you need a refill on your cardiac medications before your next appointment, please call your pharmacy.    [place] [address] Dept: [phone] Loc: [facility]  03/20/2018  You are scheduled for a Cardiac Catheterization on Tuesday, December 17 with Dr. Glenetta Hew.  1. Please arrive at the [facility] (Main Entrance A) at [facility]: [address] [place], [place] [address] at 10:00 AM (This time is two hours before your procedure to ensure your preparation). Free valet parking service is available.   Special note: Every effort is made to have your procedure done on time. Please understand that emergencies sometimes delay scheduled procedures.  2. Diet: Do not eat solid foods after midnight.  The patient may have clear liquids until 5am upon the day of the procedure.  3. Labs:   Monday @ [facility]  4. Medication instructions in preparation for your procedure:   Contrast Allergy: No   Current Outpatient Medications (Endocrine & Metabolic):  .  glipiZIDE (GLUCOTROL) 5 MG tablet, TAKE 1 TABLET(5 MG) BY MOUTH DAILY (Patient taking differently: TAKE 1 TABLET(5 MG) BY MOUTH DAILY > Patient states she takes 1/2 BID.) .  Insulin Glargine (LANTUS SOLOSTAR) 100 UNIT/ML Solostar Pen, ADMINISTER 70 UNITS UNDER THE SKIN EVERY NIGHT AT BEDTIME .  metFORMIN (GLUCOPHAGE) 500 MG tablet, Take 1 tablet (500 mg total) by mouth 2 (two) times daily with a meal. .  NORLYDA 0.35 MG tablet, TK 1 T PO D  Current Outpatient Medications (Cardiovascular):  .  atorvastatin (LIPITOR) 10 MG tablet, Take 1 tablet (10 mg total) by mouth daily. For cholesterol .  furosemide (LASIX) 40 MG tablet, Take 1 tablet (40 mg total) by mouth daily. Marland Kitchen  losartan (COZAAR) 25 MG tablet, Take 0.5 tablets (12.5 mg total) by mouth daily. .  metoprolol succinate (TOPROL XL) 25 MG 24 hr tablet, Take 0.5 tablets (12.5 mg total) by mouth daily.  Current Outpatient Medications (Respiratory):  .  benzonatate (TESSALON) 100 MG capsule, Take 1 capsule (100 mg total) by mouth 3 (three) times daily as needed for cough.    Current Outpatient Medications (Other):  Marland Kitchen  B-D ULTRAFINE III SHORT PEN 31G X 8 MM MISC, Use twice a daily to inject insulin .  B-D ULTRAFINE III SHORT PEN 31G X 8 MM MISC, USE FOR INSULIN INJECTIONS TWICE DAILY .  CONTOUR NEXT TEST test strip, USE TO TEST BLOOD SUGAR TWICE DAILY AS DIRECTED .  famotidine (PEPCID) 20 MG tablet, Take 1  tablet (20 mg total) by mouth 2 (two) times daily. Marland Kitchen  ketoconazole (NIZORAL) 2 % cream, Apply 1 application topically 2 (two) times daily. Marland Kitchen  terconazole (TERAZOL 7) 0.4 % vaginal cream, Place 1 applicator vaginally at bedtime. x7d .  triamcinolone (KENALOG) 0.1 % paste, Use as directed 1 application in the mouth or throat 2 (two) times daily. Apply a small amount to  lesions in the mouth BID up to 7-10 days *For reference purposes while preparing patient instructions.   Delete this med list prior to printing instructions for patient.*   DO NOT TAKE LASIX THE MORNING OF PROCEDURE  DO NOT TAKE METFORMIN THE MORNING OF PROCEDURE DO NOT TAKE GLIPIZIDE MORNING OF PROCEDURE ONLY TAKE 1/2 OF INSULIN DOSE THE NIGHT BEFORE PROCEDURE, DO NOT TAKE NONE THE MORNING OF PROCEURE    On the morning of your procedure, take your Aspirin and any morning medicines NOT listed above.  You may use sips of water.  5. Plan for one night stay--bring personal belongings. 6. Bring a current list of your medications and current insurance cards. 7. You MUST have a responsible person to drive you home. 8. Someone MUST be with you the first 24 hours after you arrive home or your discharge will be delayed. 9. Please wear clothes that are easy to get on and off and wear slip-on shoes.  Thank you for allowing [place] to care for you!   -- [facility]

## 2018-03-20 NOTE — Progress Notes (Signed)
Clinical Summary Tara Bryant is a 47 y.o.female seen today for follow up of the following medical problems.   1. Acute systolic HF - recent issues with increaseing DOE and orthopnea - 03/2018 echo LVEF 20% - EKG SR, nonspecific ST/T chagnes - normal TSH - 03/2018 CT PE without PE, + pulmonary edema   - starting October diffuse aches in chest, muscle aches. Worst the palpation - end of October early November treated for rhinosinusitis. DOE with activities, example bringing trash cans in - SOB was worsening over time. Severe DOE walking up 1 flight of stairs, or walking to car from parking lot - no significant chest pain. Has had progressive cough - no LE edema, + abdominal distension. Can have some orthopnea.  - lasix started by pcp just a few days ago  EtOH: no EtoH, no drugs Several family members on mother side with heart disease, she is unsure of details. Brother with stent in his 5s.  CAD risk factors: DM2, brother with stent,     - last visit we started Toprol 12.5mg  daily, increased lasix to 40mg  daily.  - weights down from 200lbs to 196 lbs in clinic. Stable SOB/DOE. Not checking home weights.  - tolerating meds.  - cough not improved with diuresis - some fever last few nights, URI symptoms.     2. Tachycardia - notes indicate heart rates often in low 100s - last visit rates were up to 130s, EKG showed sinus tach   Past Medical History:  Diagnosis Date  . Arrhythmia   . Depression   . Diabetes mellitus   . Hypertension   . Microproteinuria      Allergies  Allergen Reactions  . Aspartame And Phenylalanine      Current Outpatient Medications  Medication Sig Dispense Refill  . atorvastatin (LIPITOR) 10 MG tablet Take 1 tablet (10 mg total) by mouth daily. For cholesterol 30 tablet 5  . B-D ULTRAFINE III SHORT PEN 31G X 8 MM MISC Use twice a daily to inject insulin 100 each 5  . B-D ULTRAFINE III SHORT PEN 31G X 8 MM MISC USE FOR INSULIN  INJECTIONS TWICE DAILY 100 each 2  . benzonatate (TESSALON) 100 MG capsule Take 1 capsule (100 mg total) by mouth 3 (three) times daily as needed for cough. 24 capsule 0  . CONTOUR NEXT TEST test strip USE TO TEST BLOOD SUGAR TWICE DAILY AS DIRECTED 100 each 2  . famotidine (PEPCID) 20 MG tablet Take 1 tablet (20 mg total) by mouth 2 (two) times daily. 60 tablet 0  . furosemide (LASIX) 40 MG tablet Take 1 tablet (40 mg total) by mouth daily. 90 tablet 1  . glipiZIDE (GLUCOTROL) 5 MG tablet TAKE 1 TABLET(5 MG) BY MOUTH DAILY (Patient taking differently: TAKE 1 TABLET(5 MG) BY MOUTH DAILY > Patient states she takes 1/2 BID.) 30 tablet 5  . Insulin Glargine (LANTUS SOLOSTAR) 100 UNIT/ML Solostar Pen ADMINISTER 70 UNITS UNDER THE SKIN EVERY NIGHT AT BEDTIME 15 mL 5  . ketoconazole (NIZORAL) 2 % cream Apply 1 application topically 2 (two) times daily. 30 g 0  . lisinopril (PRINIVIL,ZESTRIL) 2.5 MG tablet Take 2.5 mg by mouth 2 (two) times daily.    . metFORMIN (GLUCOPHAGE) 500 MG tablet Take 1 tablet (500 mg total) by mouth 2 (two) times daily with a meal. 180 tablet 1  . metoprolol succinate (TOPROL XL) 25 MG 24 hr tablet Take 0.5 tablets (12.5 mg total) by mouth daily. [place]  tablet 1  . naproxen (NAPROSYN) 500 MG tablet Take 1 tablet (500 mg total) by mouth 2 (two) times daily with a meal. 30 tablet 0  . NORLYDA 0.35 MG tablet TK 1 T PO D  12  . terconazole (TERAZOL 7) 0.4 % vaginal cream Place 1 applicator vaginally at bedtime. x7d 45 g 0  . triamcinolone (KENALOG) 0.1 % paste Use as directed 1 application in the mouth or throat 2 (two) times daily. Apply a small amount to lesions in the mouth BID up to 7-10 days 5 g 0   No current facility-administered medications for this visit.      Past Surgical History:  Procedure Laterality Date  . BACK SURGERY    . BREAST BIOPSY Left 2017   benign  . NOSE SURGERY    . TONSILLECTOMY    . WISDOM TOOTH EXTRACTION       Allergies  Allergen Reactions  .  Aspartame And Phenylalanine       Family History  Problem Relation Age of Onset  . Stroke Sister 40       smoker, diabetic, HTN  . Breast cancer Sister   . Cancer Mother        breast  . Breast cancer Mother   . Diabetes Other   . Heart attack Other      Social History Ms. Tolbert reports that she has never smoked. She has never used smokeless tobacco. Ms. Zmuda reports no history of alcohol use.   Review of Systems CONSTITUTIONAL: No weight loss, fever, chills, weakness or fatigue.  HEENT: Eyes: No visual loss, blurred vision, double vision or yellow sclerae.No hearing loss, sneezing, congestion, runny nose or sore throat.  SKIN: No rash or itching.  CARDIOVASCULAR: per hpi RESPIRATORY: per hpi GASTROINTESTINAL: No anorexia, nausea, vomiting or diarrhea. No abdominal pain or blood.  GENITOURINARY: No burning on urination, no polyuria NEUROLOGICAL: No headache, dizziness, syncope, paralysis, ataxia, numbness or tingling in the extremities. No change in bowel or bladder control.  MUSCULOSKELETAL: No muscle, back pain, joint pain or stiffness.  LYMPHATICS: No enlarged nodes. No history of splenectomy.  PSYCHIATRIC: No history of depression or anxiety.  ENDOCRINOLOGIC: No reports of sweating, cold or heat intolerance. No polyuria or polydipsia.  Marland Kitchen   Physical Examination Vitals:   03/20/18 0944  BP: 110/78  Pulse: (!) 108  SpO2: 98%   Vitals:   03/20/18 0944  Weight: 196 lb (88.9 kg)  Height: 5' 7.5" (1.715 m)    Gen: resting comfortably, no acute distress HEENT: no scleral icterus, pupils equal round and reactive, no palptable cervical adenopathy,  CV: RRR, no m/r/g, no jvd Resp: Clear to auscultation bilaterally GI: abdomen is soft, non-tender, non-distended, normal bowel sounds, no hepatosplenomegaly MSK: extremities are warm, trace bilateral edema  Skin: warm, no rash Neuro:  no focal deficits Psych: appropriate affect   Diagnostic  Studies  03/2018 echo LVEF 20%, diffuse hypokinesis, cannot eval diastolic function, mild RV dysfunction, PASP 47, small pericardial effusion   Assessment and Plan  1. Acute systolic HF - progressive DOE and orthopnea over the last 2 months.  - recent imaging confirmed pulmonary edema. Echo shows LVEF 20% - started on Toprol 12.5mg  daily. Has been on lisinopril, unclear if cough is related. Change to losartan 12.5mg  daily. Over time as she tolerates meds likely change to entresto - weights down 4 lbs since last visit, still with some fluid overload by exam. Symptoms stable.  - plan for LHC/RHC next week.  Repeat labs early next week, likely will need to cut back diuretics in near future pending on labs and filling pressures of upcoming cath  2. 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 CT PE was negative recently. Normal TSH - follow CO/CI of upcoming RHC, unclear if related to low output.    I have reviewed the risks, indications, and alternatives to cardiac catheterization, possible angioplasty, and stenting with the patient and her mother today. Risks include but are not limited to bleeding, infection, vascular injury, stroke, myocardial infection, arrhythmia, kidney injury, radiation-related injury in the case of prolonged fluoroscopy use, emergency cardiac surgery, and death. The patient understands the risks of serious complication is 1-2 in 9675 with diagnostic cardiac cath and 1-2% or less with angioplasty/stenting.    Arnoldo Lenis, M.D.

## 2018-03-20 NOTE — H&P (View-Only) (Signed)
Clinical Summary Ms. Sauve is a 47 y.o.female seen today for follow up of the following medical problems.   1. Acute systolic HF - recent issues with increaseing DOE and orthopnea - 03/2018 echo LVEF 20% - EKG SR, nonspecific ST/T chagnes - normal TSH - 03/2018 CT PE without PE, + pulmonary edema   - starting October diffuse aches in chest, muscle aches. Worst the palpation - end of October early November treated for rhinosinusitis. DOE with activities, example bringing trash cans in - SOB was worsening over time. Severe DOE walking up 1 flight of stairs, or walking to car from parking lot - no significant chest pain. Has had progressive cough - no LE edema, + abdominal distension. Can have some orthopnea.  - lasix started by pcp just a few days ago  EtOH: no EtoH, no drugs Several family members on mother side with heart disease, she is unsure of details. Brother with stent in his 62s.  CAD risk factors: DM2, brother with stent,     - last visit we started Toprol 12.5mg  daily, increased lasix to 40mg  daily.  - weights down from 200lbs to 196 lbs in clinic. Stable SOB/DOE. Not checking home weights.  - tolerating meds.  - cough not improved with diuresis - some fever last few nights, URI symptoms.     2. Tachycardia - notes indicate heart rates often in low 100s - last visit rates were up to 130s, EKG showed sinus tach   Past Medical History:  Diagnosis Date  . Arrhythmia   . Depression   . Diabetes mellitus   . Hypertension   . Microproteinuria      Allergies  Allergen Reactions  . Aspartame And Phenylalanine      Current Outpatient Medications  Medication Sig Dispense Refill  . atorvastatin (LIPITOR) 10 MG tablet Take 1 tablet (10 mg total) by mouth daily. For cholesterol 30 tablet 5  . B-D ULTRAFINE III SHORT PEN 31G X 8 MM MISC Use twice a daily to inject insulin 100 each 5  . B-D ULTRAFINE III SHORT PEN 31G X 8 MM MISC USE FOR INSULIN  INJECTIONS TWICE DAILY 100 each 2  . benzonatate (TESSALON) 100 MG capsule Take 1 capsule (100 mg total) by mouth 3 (three) times daily as needed for cough. 24 capsule 0  . CONTOUR NEXT TEST test strip USE TO TEST BLOOD SUGAR TWICE DAILY AS DIRECTED 100 each 2  . famotidine (PEPCID) 20 MG tablet Take 1 tablet (20 mg total) by mouth 2 (two) times daily. 60 tablet 0  . furosemide (LASIX) 40 MG tablet Take 1 tablet (40 mg total) by mouth daily. 90 tablet 1  . glipiZIDE (GLUCOTROL) 5 MG tablet TAKE 1 TABLET(5 MG) BY MOUTH DAILY (Patient taking differently: TAKE 1 TABLET(5 MG) BY MOUTH DAILY > Patient states she takes 1/2 BID.) 30 tablet 5  . Insulin Glargine (LANTUS SOLOSTAR) 100 UNIT/ML Solostar Pen ADMINISTER 70 UNITS UNDER THE SKIN EVERY NIGHT AT BEDTIME 15 mL 5  . ketoconazole (NIZORAL) 2 % cream Apply 1 application topically 2 (two) times daily. 30 g 0  . lisinopril (PRINIVIL,ZESTRIL) 2.5 MG tablet Take 2.5 mg by mouth 2 (two) times daily.    . metFORMIN (GLUCOPHAGE) 500 MG tablet Take 1 tablet (500 mg total) by mouth 2 (two) times daily with a meal. 180 tablet 1  . metoprolol succinate (TOPROL XL) 25 MG 24 hr tablet Take 0.5 tablets (12.5 mg total) by mouth daily. [place]  tablet 1  . naproxen (NAPROSYN) 500 MG tablet Take 1 tablet (500 mg total) by mouth 2 (two) times daily with a meal. 30 tablet 0  . NORLYDA 0.35 MG tablet TK 1 T PO D  12  . terconazole (TERAZOL 7) 0.4 % vaginal cream Place 1 applicator vaginally at bedtime. x7d 45 g 0  . triamcinolone (KENALOG) 0.1 % paste Use as directed 1 application in the mouth or throat 2 (two) times daily. Apply a small amount to lesions in the mouth BID up to 7-10 days 5 g 0   No current facility-administered medications for this visit.      Past Surgical History:  Procedure Laterality Date  . BACK SURGERY    . BREAST BIOPSY Left 2017   benign  . NOSE SURGERY    . TONSILLECTOMY    . WISDOM TOOTH EXTRACTION       Allergies  Allergen Reactions  .  Aspartame And Phenylalanine       Family History  Problem Relation Age of Onset  . Stroke Sister 17       smoker, diabetic, HTN  . Breast cancer Sister   . Cancer Mother        breast  . Breast cancer Mother   . Diabetes Other   . Heart attack Other      Social History Ms. Rouse reports that she has never smoked. She has never used smokeless tobacco. Ms. Hornung reports no history of alcohol use.   Review of Systems CONSTITUTIONAL: No weight loss, fever, chills, weakness or fatigue.  HEENT: Eyes: No visual loss, blurred vision, double vision or yellow sclerae.No hearing loss, sneezing, congestion, runny nose or sore throat.  SKIN: No rash or itching.  CARDIOVASCULAR: per hpi RESPIRATORY: per hpi GASTROINTESTINAL: No anorexia, nausea, vomiting or diarrhea. No abdominal pain or blood.  GENITOURINARY: No burning on urination, no polyuria NEUROLOGICAL: No headache, dizziness, syncope, paralysis, ataxia, numbness or tingling in the extremities. No change in bowel or bladder control.  MUSCULOSKELETAL: No muscle, back pain, joint pain or stiffness.  LYMPHATICS: No enlarged nodes. No history of splenectomy.  PSYCHIATRIC: No history of depression or anxiety.  ENDOCRINOLOGIC: No reports of sweating, cold or heat intolerance. No polyuria or polydipsia.  Marland Kitchen   Physical Examination Vitals:   03/20/18 0944  BP: 110/78  Pulse: (!) 108  SpO2: 98%   Vitals:   03/20/18 0944  Weight: 196 lb (88.9 kg)  Height: 5' 7.5" (1.715 m)    Gen: resting comfortably, no acute distress HEENT: no scleral icterus, pupils equal round and reactive, no palptable cervical adenopathy,  CV: RRR, no m/r/g, no jvd Resp: Clear to auscultation bilaterally GI: abdomen is soft, non-tender, non-distended, normal bowel sounds, no hepatosplenomegaly MSK: extremities are warm, trace bilateral edema  Skin: warm, no rash Neuro:  no focal deficits Psych: appropriate affect   Diagnostic  Studies  03/2018 echo LVEF 20%, diffuse hypokinesis, cannot eval diastolic function, mild RV dysfunction, PASP 47, small pericardial effusion   Assessment and Plan  1. Acute systolic HF - progressive DOE and orthopnea over the last 2 months.  - recent imaging confirmed pulmonary edema. Echo shows LVEF 20% - started on Toprol 12.5mg  daily. Has been on lisinopril, unclear if cough is related. Change to losartan 12.5mg  daily. Over time as she tolerates meds likely change to entresto - weights down 4 lbs since last visit, still with some fluid overload by exam. Symptoms stable.  - plan for LHC/RHC next week.  Repeat labs early next week, likely will need to cut back diuretics in near future pending on labs and filling pressures of upcoming cath  2. 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 CT PE was negative recently. Normal TSH - follow CO/CI of upcoming RHC, unclear if related to low output.    I have reviewed the risks, indications, and alternatives to cardiac catheterization, possible angioplasty, and stenting with the patient and her mother today. Risks include but are not limited to bleeding, infection, vascular injury, stroke, myocardial infection, arrhythmia, kidney injury, radiation-related injury in the case of prolonged fluoroscopy use, emergency cardiac surgery, and death. The patient understands the risks of serious complication is 1-2 in 0354 with diagnostic cardiac cath and 1-2% or less with angioplasty/stenting.    Arnoldo Lenis, M.D.

## 2018-03-23 ENCOUNTER — Ambulatory Visit: Payer: BLUE CROSS/BLUE SHIELD | Admitting: Family Medicine

## 2018-03-23 ENCOUNTER — Other Ambulatory Visit (HOSPITAL_COMMUNITY)
Admission: RE | Admit: 2018-03-23 | Discharge: 2018-03-23 | Disposition: A | Discharge status: Home / Self Care | Payer: BLUE CROSS/BLUE SHIELD | Source: Ambulatory Visit | Attending: Cardiology | Admitting: Cardiology

## 2018-03-23 ENCOUNTER — Encounter: Payer: Self-pay | Admitting: Family Medicine

## 2018-03-23 ENCOUNTER — Telehealth: Payer: Self-pay | Admitting: *Deleted

## 2018-03-23 VITALS — BP 118/68 | Ht 67.5 in | Wt 197.4 lb

## 2018-03-23 DIAGNOSIS — I5021 Acute systolic (congestive) heart failure: Secondary | ICD-10-CM | POA: Diagnosis not present

## 2018-03-23 DIAGNOSIS — I5041 Acute combined systolic (congestive) and diastolic (congestive) heart failure: Secondary | ICD-10-CM | POA: Diagnosis present

## 2018-03-23 DIAGNOSIS — I420 Dilated cardiomyopathy: Secondary | ICD-10-CM | POA: Diagnosis present

## 2018-03-23 DIAGNOSIS — J019 Acute sinusitis, unspecified: Secondary | ICD-10-CM | POA: Diagnosis not present

## 2018-03-23 LAB — CBC WITH DIFFERENTIAL/PLATELET
Abs Immature Granulocytes: 0.02 10*3/uL (ref 0.00–0.07)
Basophils Absolute: 0 10*3/uL (ref 0.0–0.1)
Basophils Relative: 0 %
Eosinophils Absolute: 0.2 10*3/uL (ref 0.0–0.5)
Eosinophils Relative: 2 %
HCT: 40.1 % (ref 36.0–46.0)
Hemoglobin: 12.7 g/dL (ref 12.0–15.0)
Immature Granulocytes: 0 %
Lymphocytes Relative: 30 %
Lymphs Abs: 2.2 10*3/uL (ref 0.7–4.0)
MCH: 29.3 pg (ref 26.0–34.0)
MCHC: 31.7 g/dL (ref 30.0–36.0)
MCV: 92.6 fL (ref 80.0–100.0)
Monocytes Absolute: 0.4 10*3/uL (ref 0.1–1.0)
Monocytes Relative: 5 %
Neutro Abs: 4.6 10*3/uL (ref 1.7–7.7)
Neutrophils Relative %: 63 %
Platelets: 286 10*3/uL (ref 150–400)
RBC: 4.33 MIL/uL (ref 3.87–5.11)
RDW: 12.8 % (ref 11.5–15.5)
WBC: 7.4 10*3/uL (ref 4.0–10.5)
nRBC: 0 % (ref 0.0–0.2)

## 2018-03-23 LAB — BASIC METABOLIC PANEL
Anion gap: 11 (ref 5–15)
BUN: 13 mg/dL (ref 6–20)
CO2: 25 mmol/L (ref 22–32)
Calcium: 8.9 mg/dL (ref 8.9–10.3)
Chloride: 102 mmol/L (ref 98–111)
Creatinine, Ser: 0.74 mg/dL (ref 0.44–1.00)
GFR calc Af Amer: >60 mL/min (ref >60)
GFR calc non Af Amer: >60 mL/min (ref >60)
Glucose, Bld: 271 mg/dL — ABNORMAL HIGH (ref 70–99)
Potassium: 3.8 mmol/L (ref 3.5–5.1)
Sodium: 138 mmol/L (ref 135–145)

## 2018-03-23 LAB — MAGNESIUM: Magnesium: 1.7 mg/dL (ref 1.7–2.4)

## 2018-03-23 MED ORDER — GLIPIZIDE 5 MG PO TABS: ORAL_TABLET | ORAL | 5 refills | Status: DC

## 2018-03-23 MED ORDER — AMOXICILLIN 500 MG PO CAPS: 500.0000 mg | ORAL_CAPSULE | Freq: Three times a day (TID) | ORAL | 0 refills | Status: DC

## 2018-03-23 MED ORDER — INSULIN GLARGINE 100 UNIT/ML SOLOSTAR PEN: PEN_INJECTOR | SUBCUTANEOUS | 5 refills | Status: DC

## 2018-03-23 NOTE — Telephone Encounter (Signed)
Pt contacted pre-catheterization scheduled at [facility] for: Tuesday March 24, 2018 12 noon Verified arrival time and place: [facility] at: 10 AM  No solid food after midnight prior to cath, clear liquids until 5 AM day of procedure. Contrast allergy: no  Hold: Metformin-day of procedure and 48 hours post procedure. Glipizide-AM of procedure. Insulin-AM of procedure. Insulin-1/2 usual dose PM prior to procedure.  Furosemide-AM of procedure.  Except hold medications AM meds can be  taken pre-cath with sip of water including: ASA 81 mg  Attempted to contact patient to discuss instructions, unable to leave a message.  Confirmed patient has responsible person to drive home post procedure and for 24 hours after you arrive home:

## 2018-03-23 NOTE — Progress Notes (Signed)
   Subjective:    [name], female    DOB: 04-Nov-1970, 47 y.o.   MRN: [id number]  Sinusitis  This is a new problem. Episode onset: one week. Associated symptoms include congestion and coughing. (Fever)   fever worse in the eve  t max 102  Up to  102 at [facility]   Pt had bad cough last week, dry in nature all day  Now cough s congested ,  pts daughter had sickness fridaay after t giving,   Had sickness viral in nature, still coughing        Sounds bad    Having heart cath tomorrow.   Pt states Dr. Nicki Reaper told her to hold off on metformin and she wants to know when or if to start back on.   Review of Systems  HENT: Positive for congestion.   Respiratory: Positive for cough.        Objective:   Physical Exam  Alert active good hydration.  Some nasal congestion and bogginess.  Intermittent bronchial cough.  Pharynx normal.  Lungs clear.  Heart regular in rhythm.      Assessment & Plan:  Acute respiratory illness.  Potential element of sinusitis.  Discussed.  Will cover with antibiotics.  Due to have cardiac cath tomorrow, hold metformin.  May resume next week.  Questions answered.  Of course need to know whether atherosclerotic disease or some other etiologies such as myocarditis.  Greater than 50% of this 15 minute face to face visit was spent in counseling and discussion and coordination of care regarding the above diagnosis/diagnosies

## 2018-03-23 NOTE — Telephone Encounter (Signed)
I reviewed instructions with patient, she verbalized understanding, thanked me for call.

## 2018-03-24 ENCOUNTER — Inpatient Hospital Stay (HOSPITAL_COMMUNITY)
Admission: AD | Admit: 2018-03-24 | Discharge: 2018-03-27 | DRG: 287 | Disposition: A | Discharge status: Home / Self Care | Payer: BLUE CROSS/BLUE SHIELD | Attending: Cardiology | Admitting: Cardiology

## 2018-03-24 ENCOUNTER — Encounter (HOSPITAL_COMMUNITY)
Admission: AD | Disposition: A | Discharge status: Home / Self Care | Payer: Self-pay | Source: Home / Self Care | Attending: Cardiology

## 2018-03-24 ENCOUNTER — Other Ambulatory Visit: Payer: Self-pay

## 2018-03-24 DIAGNOSIS — I10 Essential (primary) hypertension: Secondary | ICD-10-CM | POA: Diagnosis present

## 2018-03-24 DIAGNOSIS — I2722 Pulmonary hypertension due to left heart disease: Secondary | ICD-10-CM | POA: Diagnosis present

## 2018-03-24 DIAGNOSIS — J069 Acute upper respiratory infection, unspecified: Secondary | ICD-10-CM | POA: Diagnosis present

## 2018-03-24 DIAGNOSIS — I2510 Atherosclerotic heart disease of native coronary artery without angina pectoris: Secondary | ICD-10-CM | POA: Diagnosis present

## 2018-03-24 DIAGNOSIS — E119 Type 2 diabetes mellitus without complications: Secondary | ICD-10-CM | POA: Diagnosis present

## 2018-03-24 DIAGNOSIS — Z794 Long term (current) use of insulin: Secondary | ICD-10-CM

## 2018-03-24 DIAGNOSIS — I5043 Acute on chronic combined systolic (congestive) and diastolic (congestive) heart failure: Secondary | ICD-10-CM | POA: Diagnosis present

## 2018-03-24 DIAGNOSIS — I5041 Acute combined systolic (congestive) and diastolic (congestive) heart failure: Secondary | ICD-10-CM | POA: Diagnosis present

## 2018-03-24 DIAGNOSIS — Z8249 Family history of ischemic heart disease and other diseases of the circulatory system: Secondary | ICD-10-CM | POA: Diagnosis not present

## 2018-03-24 DIAGNOSIS — Z79899 Other long term (current) drug therapy: Secondary | ICD-10-CM

## 2018-03-24 DIAGNOSIS — I472 Ventricular tachycardia: Secondary | ICD-10-CM | POA: Diagnosis not present

## 2018-03-24 DIAGNOSIS — I420 Dilated cardiomyopathy: Secondary | ICD-10-CM | POA: Diagnosis present

## 2018-03-24 DIAGNOSIS — Z888 Allergy status to other drugs, medicaments and biological substances status: Secondary | ICD-10-CM | POA: Diagnosis not present

## 2018-03-24 DIAGNOSIS — I5021 Acute systolic (congestive) heart failure: Secondary | ICD-10-CM

## 2018-03-24 DIAGNOSIS — I110 Hypertensive heart disease with heart failure: Principal | ICD-10-CM | POA: Diagnosis present

## 2018-03-24 HISTORY — PX: INTRAVASCULAR PRESSURE WIRE/FFR STUDY: CATH118243

## 2018-03-24 HISTORY — PX: RIGHT/LEFT HEART CATH AND CORONARY ANGIOGRAPHY: CATH118266

## 2018-03-24 HISTORY — PX: CORONARY PRESSURE/FFR STUDY: CATH118243

## 2018-03-24 LAB — POCT I-STAT 3, ART BLOOD GAS (G3+)
Acid-base deficit: 1 mmol/L (ref 0.0–2.0)
Bicarbonate: 22.7 mmol/L (ref 20.0–28.0)
O2 Saturation: 95 %
TCO2: 24 mmol/L (ref 22–32)
pCO2 arterial: 35.5 mmHg (ref 32.0–48.0)
pH, Arterial: 7.414 (ref 7.350–7.450)
pO2, Arterial: 75 mmHg — ABNORMAL LOW (ref 83.0–108.0)

## 2018-03-24 LAB — CREATININE, SERUM
Creatinine, Ser: 0.85 mg/dL (ref 0.44–1.00)
GFR calc Af Amer: >60 mL/min (ref >60)
GFR calc non Af Amer: >60 mL/min (ref >60)

## 2018-03-24 LAB — POCT I-STAT 3, VENOUS BLOOD GAS (G3P V)
Bicarbonate: 24.9 mmol/L (ref 20.0–28.0)
Bicarbonate: 25 mmol/L (ref 20.0–28.0)
O2 Saturation: 58 %
O2 Saturation: 62 %
TCO2: 26 mmol/L (ref 22–32)
TCO2: 26 mmol/L (ref 22–32)
pCO2, Ven: 40.7 mmHg — ABNORMAL LOW (ref 44.0–60.0)
pCO2, Ven: 41.4 mmHg — ABNORMAL LOW (ref 44.0–60.0)
pH, Ven: 7.39 (ref 7.250–7.430)
pH, Ven: 7.395 (ref 7.250–7.430)
pO2, Ven: 30 mmHg — CL (ref 32.0–45.0)
pO2, Ven: 33 mmHg (ref 32.0–45.0)

## 2018-03-24 LAB — CBC
HCT: 41.8 % (ref 36.0–46.0)
Hemoglobin: 13.8 g/dL (ref 12.0–15.0)
MCH: 29.4 pg (ref 26.0–34.0)
MCHC: 33 g/dL (ref 30.0–36.0)
MCV: 88.9 fL (ref 80.0–100.0)
Platelets: 313 10*3/uL (ref 150–400)
RBC: 4.7 MIL/uL (ref 3.87–5.11)
RDW: 12.5 % (ref 11.5–15.5)
WBC: 10 10*3/uL (ref 4.0–10.5)
nRBC: 0 % (ref 0.0–0.2)

## 2018-03-24 LAB — PREGNANCY, URINE: Preg Test, Ur: NEGATIVE

## 2018-03-24 LAB — GLUCOSE, CAPILLARY
Glucose-Capillary: 193 mg/dL — ABNORMAL HIGH (ref 70–99)
Glucose-Capillary: 127 mg/dL — ABNORMAL HIGH (ref 70–99)
Glucose-Capillary: 234 mg/dL — ABNORMAL HIGH (ref 70–99)

## 2018-03-24 LAB — POCT ACTIVATED CLOTTING TIME: Activated Clotting Time: 279 seconds

## 2018-03-24 SURGERY — RIGHT/LEFT HEART CATH AND CORONARY ANGIOGRAPHY
Anesthesia: LOCAL

## 2018-03-24 MED ORDER — IOHEXOL 350 MG/ML SOLN
INTRAVENOUS | Status: DC | PRN
  Administered 2018-03-24: 120 mL via INTRA_ARTERIAL

## 2018-03-24 MED ORDER — VERAPAMIL HCL 2.5 MG/ML IV SOLN
INTRAVENOUS | Status: DC | PRN
  Administered 2018-03-24: 10 mL via INTRA_ARTERIAL

## 2018-03-24 MED ORDER — FENTANYL CITRATE (PF) 100 MCG/2ML IJ SOLN
INTRAMUSCULAR | Status: AC
  Filled 2018-03-24: qty 2

## 2018-03-24 MED ORDER — BENZONATATE 100 MG PO CAPS: 100.0000 mg | ORAL_CAPSULE | Freq: Three times a day (TID) | ORAL | Status: DC | PRN

## 2018-03-24 MED ORDER — INSULIN GLARGINE 100 UNIT/ML SOLOSTAR PEN: 60.0000 [IU] | PEN_INJECTOR | Freq: Every day | SUBCUTANEOUS | Status: DC

## 2018-03-24 MED ORDER — FUROSEMIDE 10 MG/ML IJ SOLN
80.0000 mg | Freq: Two times a day (BID) | INTRAMUSCULAR | Status: DC
  Administered 2018-03-24 – 2018-03-25 (×2): 80 mg via INTRAVENOUS
  Filled 2018-03-24: qty 8

## 2018-03-24 MED ORDER — FUROSEMIDE 10 MG/ML IJ SOLN
INTRAMUSCULAR | Status: AC
  Filled 2018-03-24: qty 4

## 2018-03-24 MED ORDER — LIDOCAINE HCL (PF) 1 % IJ SOLN
INTRAMUSCULAR | Status: AC
  Filled 2018-03-24: qty 30

## 2018-03-24 MED ORDER — INSULIN ASPART 100 UNIT/ML ~~LOC~~ SOLN
0.0000 [IU] | Freq: Three times a day (TID) | SUBCUTANEOUS | Status: DC
  Administered 2018-03-25: 8 [IU] via SUBCUTANEOUS
  Administered 2018-03-25 – 2018-03-26 (×3): 3 [IU] via SUBCUTANEOUS
  Administered 2018-03-26: 5 [IU] via SUBCUTANEOUS
  Administered 2018-03-26 – 2018-03-27 (×2): 3 [IU] via SUBCUTANEOUS
  Administered 2018-03-27: 2 [IU] via SUBCUTANEOUS
  Administered 2018-03-27: 5 [IU] via SUBCUTANEOUS

## 2018-03-24 MED ORDER — FAMOTIDINE 20 MG PO TABS
20.0000 mg | ORAL_TABLET | Freq: Two times a day (BID) | ORAL | Status: DC
  Administered 2018-03-24 – 2018-03-27 (×6): 20 mg via ORAL
  Filled 2018-03-24 (×6): qty 1

## 2018-03-24 MED ORDER — FUROSEMIDE 10 MG/ML IJ SOLN
INTRAMUSCULAR | Status: DC | PRN
  Administered 2018-03-24 (×2): 40 mg via INTRAVENOUS

## 2018-03-24 MED ORDER — FUROSEMIDE 10 MG/ML IJ SOLN
80.0000 mg | Freq: Two times a day (BID) | INTRAMUSCULAR | Status: DC
  Filled 2018-03-24: qty 8

## 2018-03-24 MED ORDER — HEPARIN SODIUM (PORCINE) 1000 UNIT/ML IJ SOLN
INTRAMUSCULAR | Status: DC | PRN
  Administered 2018-03-24: 4500 [IU] via INTRAVENOUS
  Administered 2018-03-24: 5000 [IU] via INTRAVENOUS

## 2018-03-24 MED ORDER — SODIUM CHLORIDE 0.9% FLUSH
3.0000 mL | Freq: Two times a day (BID) | INTRAVENOUS | Status: DC
  Administered 2018-03-24 – 2018-03-27 (×6): 3 mL via INTRAVENOUS

## 2018-03-24 MED ORDER — FUROSEMIDE 10 MG/ML IJ SOLN: 80.0000 mg | Freq: Two times a day (BID) | INTRAMUSCULAR | Status: DC

## 2018-03-24 MED ORDER — MIDAZOLAM HCL 2 MG/2ML IJ SOLN
INTRAMUSCULAR | Status: DC | PRN
  Administered 2018-03-24: 2 mg via INTRAVENOUS

## 2018-03-24 MED ORDER — HEPARIN (PORCINE) IN NACL 1000-0.9 UT/500ML-% IV SOLN
INTRAVENOUS | Status: DC | PRN
  Administered 2018-03-24: 500 mL

## 2018-03-24 MED ORDER — LOSARTAN POTASSIUM 25 MG PO TABS
12.5000 mg | ORAL_TABLET | Freq: Every day | ORAL | Status: DC
  Administered 2018-03-25: 12.5 mg via ORAL
  Filled 2018-03-24: qty 1

## 2018-03-24 MED ORDER — AMOXICILLIN 500 MG PO CAPS
500.0000 mg | ORAL_CAPSULE | Freq: Three times a day (TID) | ORAL | Status: DC
  Administered 2018-03-24 – 2018-03-27 (×10): 500 mg via ORAL
  Filled 2018-03-24 (×10): qty 1

## 2018-03-24 MED ORDER — HEPARIN SODIUM (PORCINE) 1000 UNIT/ML IJ SOLN
INTRAMUSCULAR | Status: AC
  Filled 2018-03-24: qty 1

## 2018-03-24 MED ORDER — ASPIRIN 81 MG PO CHEW
81.0000 mg | CHEWABLE_TABLET | ORAL | Status: AC
  Administered 2018-03-24: 81 mg via ORAL
  Filled 2018-03-24: qty 1

## 2018-03-24 MED ORDER — LIDOCAINE HCL (PF) 1 % IJ SOLN
INTRAMUSCULAR | Status: DC | PRN
  Administered 2018-03-24: 1 mL
  Administered 2018-03-24: 2 mL

## 2018-03-24 MED ORDER — ACETAMINOPHEN 325 MG PO TABS: 650.0000 mg | ORAL_TABLET | ORAL | Status: DC | PRN

## 2018-03-24 MED ORDER — INSULIN GLARGINE 100 UNIT/ML ~~LOC~~ SOLN
60.0000 [IU] | Freq: Every day | SUBCUTANEOUS | Status: DC
  Administered 2018-03-24 – 2018-03-26 (×3): 60 [IU] via SUBCUTANEOUS
  Filled 2018-03-24 (×4): qty 0.6

## 2018-03-24 MED ORDER — HEPARIN SODIUM (PORCINE) 5000 UNIT/ML IJ SOLN
5000.0000 [IU] | Freq: Three times a day (TID) | INTRAMUSCULAR | Status: DC
  Administered 2018-03-25: 5000 [IU] via SUBCUTANEOUS
  Filled 2018-03-24 (×2): qty 1

## 2018-03-24 MED ORDER — VERAPAMIL HCL 2.5 MG/ML IV SOLN
INTRAVENOUS | Status: AC
  Filled 2018-03-24: qty 2

## 2018-03-24 MED ORDER — ONDANSETRON HCL 4 MG/2ML IJ SOLN: 4.0000 mg | Freq: Four times a day (QID) | INTRAMUSCULAR | Status: DC | PRN

## 2018-03-24 MED ORDER — FENTANYL CITRATE (PF) 100 MCG/2ML IJ SOLN
INTRAMUSCULAR | Status: DC | PRN
  Administered 2018-03-24: 25 ug via INTRAVENOUS

## 2018-03-24 MED ORDER — SODIUM CHLORIDE 0.9 % IV SOLN: 250.0000 mL | INTRAVENOUS | Status: DC | PRN

## 2018-03-24 MED ORDER — HEPARIN (PORCINE) IN NACL 1000-0.9 UT/500ML-% IV SOLN
INTRAVENOUS | Status: AC
  Filled 2018-03-24: qty 1000

## 2018-03-24 MED ORDER — SODIUM CHLORIDE 0.9 % IV SOLN: INTRAVENOUS | Status: AC

## 2018-03-24 MED ORDER — METOPROLOL SUCCINATE ER 25 MG PO TB24
25.0000 mg | ORAL_TABLET | Freq: Every day | ORAL | Status: DC
  Administered 2018-03-25 – 2018-03-27 (×3): 25 mg via ORAL
  Filled 2018-03-24 (×3): qty 1

## 2018-03-24 MED ORDER — SODIUM CHLORIDE 0.9 % IV SOLN
INTRAVENOUS | Status: AC | PRN
  Administered 2018-03-24: 90 mL/h via INTRAVENOUS

## 2018-03-24 MED ORDER — SODIUM CHLORIDE 0.9 % IV SOLN: INTRAVENOUS | Status: DC

## 2018-03-24 MED ORDER — SODIUM CHLORIDE 0.9% FLUSH: 3.0000 mL | Freq: Two times a day (BID) | INTRAVENOUS | Status: DC

## 2018-03-24 MED ORDER — ATORVASTATIN CALCIUM 10 MG PO TABS
10.0000 mg | ORAL_TABLET | Freq: Every day | ORAL | Status: DC
  Administered 2018-03-25 – 2018-03-27 (×3): 10 mg via ORAL
  Filled 2018-03-24 (×3): qty 1

## 2018-03-24 MED ORDER — SODIUM CHLORIDE 0.9% FLUSH: 3.0000 mL | INTRAVENOUS | Status: DC | PRN

## 2018-03-24 MED ORDER — MIDAZOLAM HCL 2 MG/2ML IJ SOLN
INTRAMUSCULAR | Status: AC
  Filled 2018-03-24: qty 2

## 2018-03-24 SURGICAL SUPPLY — 15 items
CATH BALLN WEDGE 5F 110CM (CATHETERS) ×1 IMPLANT
CATH LAUNCHER 5F EBU3.5 (CATHETERS) ×1 IMPLANT
CATH OPTITORQUE TIG 4.0 5F (CATHETERS) ×1 IMPLANT
DEVICE RAD COMP TR BAND LRG (VASCULAR PRODUCTS) ×1 IMPLANT
GLIDESHEATH SLEND A-KIT 6F 22G (SHEATH) ×1 IMPLANT
GUIDEWIRE INQWIRE 1.5J.035X260 (WIRE) IMPLANT
GUIDEWIRE PRESSURE COMET II (WIRE) ×1 IMPLANT
INQWIRE 1.5J .035X260CM (WIRE) ×2
KIT ESSENTIALS PG (KITS) ×2 IMPLANT
KIT HEART LEFT (KITS) ×2 IMPLANT
PACK CARDIAC CATHETERIZATION (CUSTOM PROCEDURE TRAY) ×2 IMPLANT
SHEATH GLIDE SLENDER 4/5FR (SHEATH) ×1 IMPLANT
TRANSDUCER W/STOPCOCK (MISCELLANEOUS) ×2 IMPLANT
TUBING CIL FLEX 10 FLL-RA (TUBING) ×2 IMPLANT
WIRE MINAMO 190 (WIRE) ×1 IMPLANT

## 2018-03-24 NOTE — Progress Notes (Signed)
Pt was received earlier by charge nurse and manager, pt has orders for lasx im and per report she got 80mg  this am and 40 post cath, per mar review pt got a total of 77m post cath, purewick in place with excellent results, paged PA Bharat on call to advise on lasix ordered, started TR band air removal, report  9cc to start level 0, right brachial coban intact

## 2018-03-24 NOTE — Interval H&P Note (Signed)
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   The various methods of treatment have been discussed with the patient and family. After consideration of risks, benefits and other options for treatment, the patient has consented to  Procedure(s): RIGHT/LEFT HEART CATH AND CORONARY ANGIOGRAPHY (N/A) WITH POSSIBLE PERCUTANEOUS CORONARY INTERVENTION as a surgical intervention .    The patient's history has been reviewed, patient examined, no change in status, stable for surgery.  I have reviewed the patient's chart and labs.  Questions were answCath Lab Visit (complete for each Cath Lab visit)  Clinical Evaluation Leading to the Procedure:   ACS: No.  Non-ACS:     Anginal Classification: CCS III - CHF  Anti-ischemic medical therapy: Minimal Therapy (1 class of medications)  Non-Invasive Test Results: High-risk stress test findings: cardiac mortality >3%/year -severely reduced EF by echo  Prior CABG: No previous CABG   Glenetta Hew

## 2018-03-25 ENCOUNTER — Encounter (HOSPITAL_COMMUNITY): Payer: Self-pay

## 2018-03-25 ENCOUNTER — Inpatient Hospital Stay (HOSPITAL_COMMUNITY): Payer: BLUE CROSS/BLUE SHIELD

## 2018-03-25 DIAGNOSIS — I5041 Acute combined systolic (congestive) and diastolic (congestive) heart failure: Secondary | ICD-10-CM

## 2018-03-25 DIAGNOSIS — I10 Essential (primary) hypertension: Secondary | ICD-10-CM

## 2018-03-25 LAB — GLUCOSE, CAPILLARY
Glucose-Capillary: 169 mg/dL — ABNORMAL HIGH (ref 70–99)
Glucose-Capillary: 284 mg/dL — ABNORMAL HIGH (ref 70–99)
Glucose-Capillary: 180 mg/dL — ABNORMAL HIGH (ref 70–99)
Glucose-Capillary: 123 mg/dL — ABNORMAL HIGH (ref 70–99)

## 2018-03-25 LAB — BASIC METABOLIC PANEL
Anion gap: 14 (ref 5–15)
BUN: 14 mg/dL (ref 6–20)
CO2: 26 mmol/L (ref 22–32)
Calcium: 8.7 mg/dL — ABNORMAL LOW (ref 8.9–10.3)
Chloride: 102 mmol/L (ref 98–111)
Creatinine, Ser: 0.97 mg/dL (ref 0.44–1.00)
GFR calc Af Amer: >60 mL/min (ref >60)
GFR calc non Af Amer: >60 mL/min (ref >60)
Glucose, Bld: 173 mg/dL — ABNORMAL HIGH (ref 70–99)
Potassium: 3.1 mmol/L — ABNORMAL LOW (ref 3.5–5.1)
Sodium: 142 mmol/L (ref 135–145)

## 2018-03-25 LAB — IRON AND TIBC
Iron: 72 ug/dL (ref 28–170)
Saturation Ratios: 17 % (ref 10.4–31.8)
TIBC: 421 ug/dL (ref 250–450)
UIBC: 349 ug/dL

## 2018-03-25 LAB — HEMOGLOBIN A1C
Hgb A1c MFr Bld: 8.2 % — ABNORMAL HIGH (ref 4.8–5.6)
Mean Plasma Glucose: 188.64 mg/dL

## 2018-03-25 LAB — SEDIMENTATION RATE: Sed Rate: 39 mm/hr — ABNORMAL HIGH (ref 0–22)

## 2018-03-25 LAB — FERRITIN: Ferritin: 58 ng/mL (ref 11–307)

## 2018-03-25 MED ORDER — GADOBUTROL 1 MMOL/ML IV SOLN
9.0000 mL | Freq: Once | INTRAVENOUS | Status: AC | PRN
  Administered 2018-03-25: 9 mL via INTRAVENOUS

## 2018-03-25 MED ORDER — FUROSEMIDE 10 MG/ML IJ SOLN
40.0000 mg | Freq: Two times a day (BID) | INTRAMUSCULAR | Status: DC
  Administered 2018-03-25 – 2018-03-26 (×2): 40 mg via INTRAVENOUS
  Filled 2018-03-25 (×2): qty 4

## 2018-03-25 MED ORDER — ASPIRIN EC 81 MG PO TBEC
81.0000 mg | DELAYED_RELEASE_TABLET | Freq: Every day | ORAL | Status: DC
  Administered 2018-03-26 – 2018-03-27 (×2): 81 mg via ORAL
  Filled 2018-03-25 (×2): qty 1

## 2018-03-25 MED ORDER — POTASSIUM CHLORIDE CRYS ER 20 MEQ PO TBCR
40.0000 meq | EXTENDED_RELEASE_TABLET | Freq: Two times a day (BID) | ORAL | Status: AC
  Administered 2018-03-25 (×2): 40 meq via ORAL
  Filled 2018-03-25 (×2): qty 2

## 2018-03-25 MED ORDER — LOSARTAN POTASSIUM 25 MG PO TABS
25.0000 mg | ORAL_TABLET | Freq: Every day | ORAL | Status: DC
  Administered 2018-03-26 – 2018-03-27 (×2): 25 mg via ORAL
  Filled 2018-03-25 (×2): qty 1

## 2018-03-25 NOTE — Progress Notes (Signed)
Progress Note  Patient Name: Tara Bryant Date of Encounter: 03/25/2018  Primary Cardiologist: Carlyle Dolly, MD   Subjective   Feeling a little better.  No chest pain.  Breathing improving.  No orthopnea.   Inpatient Medications    Scheduled Meds: . amoxicillin  500 mg Oral TID  . atorvastatin  10 mg Oral Daily  . famotidine  20 mg Oral BID  . furosemide  80 mg Intravenous BID  . heparin  5,000 Units Subcutaneous Q8H  . insulin aspart  0-15 Units Subcutaneous TID WC  . insulin glargine  60 Units Subcutaneous QHS  . losartan  12.5 mg Oral Daily  . metoprolol succinate  25 mg Oral Daily  . sodium chloride flush  3 mL Intravenous Q12H   Continuous Infusions: . sodium chloride     PRN Meds: sodium chloride, acetaminophen, benzonatate, ondansetron (ZOFRAN) IV, sodium chloride flush   Vital Signs    Vitals:   03/24/18 2029 03/24/18 2325 03/25/18 0450 03/25/18 0938  BP: 93/76 117/84 114/82 103/69  Pulse: 97 (!) 102 90 (!) 101  Resp: 16 16  20   Temp: 98.5 F (36.9 C) 98.2 F (36.8 C) 97.6 F (36.4 C) 97.8 F (36.6 C)  TempSrc: Oral Oral Oral Oral  SpO2: 90% 97% 100% 99%  Weight:   84.6 kg   Height:        Intake/Output Summary (Last 24 hours) at 03/25/2018 1102 Last data filed at 03/25/2018 0900 Gross per 24 hour  Intake 1086 ml  Output 3000 ml  Net -1914 ml   Filed Weights   03/24/18 1739 03/25/18 0450  Weight: 86.6 kg 84.6 kg    Telemetry    Sinus tachycardia.  PVCs. 9 beats NSVT - Personally Reviewed  ECG    N/a  - Personally Reviewed  Physical Exam   VS:  BP 103/69 (BP Location: Left Arm)   Pulse (!) 101   Temp 97.8 F (36.6 C) (Oral)   Resp 20   Ht 5' 8"  (1.727 m)   Wt 84.6 kg   SpO2 99%   BMI 28.36 kg/m  , BMI Body mass index is 28.36 kg/m. GENERAL:  Well appearing HEENT: Pupils equal round and reactive, fundi not visualized, oral mucosa unremarkable NECK:  +Jugular venous distention, waveform within normal limits, carotid  upstroke brisk and symmetric, no bruits, no thyromegaly LYMPHATICS:  No cervical adenopathy LUNGS:  Bibasilar crackles HEART:  RRR.  PMI not displaced or sustained,S1 and S2 within normal limits, no S3, no S4, no clicks, no rubs, no murmurs ABD:  Flat, positive bowel sounds normal in frequency in pitch, no bruits, no rebound, no guarding, no midline pulsatile mass, no hepatomegaly, no splenomegaly EXT:  2 plus pulses throughout, no edema, no cyanosis no clubbing SKIN:  No rashes no nodules NEURO:  Cranial nerves II through XII grossly intact, motor grossly intact throughout [facility]:  Cognitively intact, oriented to person place and time  Labs    Chemistry Recent Labs  Lab 03/23/18 1031 03/24/18 1927 03/25/18 0733  NA 138  --  142  K 3.8  --  3.1*  CL 102  --  102  CO2 25  --  26  GLUCOSE 271*  --  173*  BUN 13  --  14  CREATININE 0.74 0.85 0.97  CALCIUM 8.9  --  8.7*  GFRNONAA >60 >60 >60  GFRAA >60 >60 >60  ANIONGAP 11  --  14     Hematology Recent Labs  Lab 03/23/18 1031 03/24/18 1927  WBC 7.4 10.0  RBC 4.33 4.70  HGB 12.7 13.8  HCT 40.1 41.8  MCV 92.6 88.9  MCH 29.3 29.4  MCHC 31.7 33.0  RDW 12.8 12.5  PLT 286 313    Cardiac EnzymesNo results for input(s): TROPONINI in the last 168 hours. No results for input(s): TROPIPOC in the last 168 hours.   BNPNo results for input(s): BNP, PROBNP in the last 168 hours.   DDimer No results for input(s): DDIMER in the last 168 hours.   Radiology    No results found.  Cardiac Studies   LHC/RHC 03/24/18:  Prox LAD lesion is 55% stenosed. DFR 0.92  Otherwise angiographically normal coronary arteries.  Hemodynamic findings consistent with severe pulmonary hypertension.  LV end diastolic pressure is severely elevated.  SUMMARY:  Moderate single-vessel disease with roughly 60% mid LAD (DFR 0.92 -not physiologically significant)  Otherwise minimal CAD.  Severely elevated pulmonary wedge pressure and LVEDP  consistent with secondary pulmonary pretension from LV failure (ACUTE COMBINED SYSTOLIC AND DIASTOLIC HEART FAILURE)  Mild-moderately reduced cardiac output/index.  (Output 4.48, Index 2.2.)  RECOMMENDATIONS:  Based on severely elevated pressures, I think is probably best served for her to be admitted for IV diuresis.  Will admit and placed on 80 mg IV twice daily Lasix.  She is on low-dose ARB and beta-blocker which can be titrated up while she is in the hospital.  Continue optimization of medical management.  We will continue lower dose Lantus (60 units as opposed to 75 units nightly) and cover with sliding scale insulin.  Continue to amoxicillin recently diagnosed, this is probably treating what was thought to be pneumonia but was probably more CHF.  Defer to primary team for canceling.  RHC:  RA 10, RV 70/1, PA 80/26, PA mean 45, PCWP 41 LV 128/2, LVEDP 27  Fick CO 4.48, CI 2.2  Echo 03/17/18: Study Conclusions  - Left ventricle: The cavity size was normal. Systolic function was   severely reduced. The estimated ejection fraction was 20%.   Diffuse hypokinesis. Mild posterior wall thickening. The study is   not technically sufficient to allow evaluation of LV diastolic   function. - Mitral valve: Mildly thickened leaflets . - Right ventricle: The cavity size was mildly dilated. Systolic   function was mildly reduced. - Tricuspid valve: There was mild regurgitation. - Pulmonary arteries: PA peak pressure: 47 mm Hg (S). - Systemic veins: The IVC is dilated with normal respiratory   variation. Estimated right atrial pressure is 8 mmHg. - Pericardium, extracardiac: Small circumferential pericardial   effusion. Features were not consistent with tamponade physiology.  Patient Profile     47 y.o. female with diabetes here with acute systolic and diastolic heart failure and non-obstructive CAD.  Assessment & Plan    # Acute systolic and diastolic heart failure: Symptoms  started 01/2018.  She doesn't recall any preceding viral illness but has been treated for upper respiratory infections since her symptoms began.  Each time was associated with discolored sputum.  She is on lasix 53m IV bid and is net -2.1L.  RA pressure was 10 on cath.  LVEDP was elevated to 27 suggesting she would benefit from more afterload reduction.  We will reduce lasix to 416mIV BID.  There is no clear cause for her cardiomyopathy. Thyroid function is normal.  CBC and CMP are unremarkable. There is no FH or preceding viral illness.  No EtOH abuse or poorly controlled HTN.  We  will try to increase losartan to 53m.  Continue metoprolol.  If BP doesn't allow increasing metoprolol consider ivabradine.  Supplement potassium.  We will get a cardiac MRI to look for infiltrative CM.  We will also check ESR, ANA, ferritin, TIBC and HIV.  # Severe Pulmonary Hypertension: Likely due to left sided heart disease.  Check CT didn't show intrinsic lung disease.  Diuresis, treatment and diagnosis as above.   # Moderate CAD: 55% proximal LAD with insignificant FFR.  Continue atorvastatin and add aspirin.   # DM:  Hemoglobin A1c 8.2%.  Would add Jardiance.  For questions or updates, please contact CPot[place] ValleyPlease consult [email or web address] for contact info under        Signed, TSkeet Latch MD  03/25/2018, 11:02 AM

## 2018-03-25 NOTE — Progress Notes (Addendum)
   03/25/18 0213  Vitals  Ectopy Ventricular tachycardia, non-sustained (8bts vtach, Chiniqua Kilcrease RN NOTIFIED )  Provider Notification  Provider Name/Title Ahkter  Date Provider Notified 03/25/18  Time Provider Notified 0225  Notification Type Page  Notification Reason Other (Comment) (8 beat run vtach)  CCMD notified of 8 beats of vtach. Pt asymptomatic and resting.  RN notified Cardiology on call.

## 2018-03-25 NOTE — Plan of Care (Signed)
  Problem: Education: Goal: Knowledge of General Education information will improve Description Including pain rating scale, medication(s)/side effects and non-pharmacologic comfort measures Outcome: Progressing   Problem: Clinical Measurements: Goal: Respiratory complications will improve Outcome: Progressing Goal: Cardiovascular complication will be avoided Outcome: Progressing   Problem: Activity: Goal: Risk for activity intolerance will decrease Outcome: Progressing   Problem: Nutrition: Goal: Adequate nutrition will be maintained Outcome: Progressing   Problem: Coping: Goal: Level of anxiety will decrease Outcome: Progressing   Problem: Elimination: Goal: Will not experience complications related to urinary retention Outcome: Progressing   Problem: Pain Managment: Goal: General experience of comfort will improve Outcome: Progressing   Problem: Safety: Goal: Ability to remain free from injury will improve Outcome: Progressing   Problem: Skin Integrity: Goal: Risk for impaired skin integrity will decrease Outcome: Progressing

## 2018-03-26 DIAGNOSIS — I2510 Atherosclerotic heart disease of native coronary artery without angina pectoris: Secondary | ICD-10-CM

## 2018-03-26 DIAGNOSIS — I420 Dilated cardiomyopathy: Secondary | ICD-10-CM

## 2018-03-26 LAB — GLUCOSE, CAPILLARY
Glucose-Capillary: 189 mg/dL — ABNORMAL HIGH (ref 70–99)
Glucose-Capillary: 234 mg/dL — ABNORMAL HIGH (ref 70–99)
Glucose-Capillary: 182 mg/dL — ABNORMAL HIGH (ref 70–99)
Glucose-Capillary: 205 mg/dL — ABNORMAL HIGH (ref 70–99)

## 2018-03-26 LAB — ENA+DNA/DS+ANTICH+CENTRO+JO...
Anti JO-1: <0.2 AI (ref 0.0–0.9)
Centromere Ab Screen: 1 AI — ABNORMAL HIGH (ref 0.0–0.9)
Chromatin Ab SerPl-aCnc: <0.2 AI (ref 0.0–0.9)
ENA SM Ab Ser-aCnc: <0.2 AI (ref 0.0–0.9)
Ribonucleic Protein: <0.2 AI (ref 0.0–0.9)
SSA (Ro) (ENA) Antibody, IgG: <0.2 AI (ref 0.0–0.9)
SSB (La) (ENA) Antibody, IgG: <0.2 AI (ref 0.0–0.9)
Scleroderma (Scl-70) (ENA) Antibody, IgG: <0.2 AI (ref 0.0–0.9)
ds DNA Ab: <1 IU/mL (ref 0–9)

## 2018-03-26 LAB — BASIC METABOLIC PANEL
Anion gap: 12 (ref 5–15)
BUN: 21 mg/dL — ABNORMAL HIGH (ref 6–20)
CO2: 25 mmol/L (ref 22–32)
Calcium: 9.1 mg/dL (ref 8.9–10.3)
Chloride: 98 mmol/L (ref 98–111)
Creatinine, Ser: 1.08 mg/dL — ABNORMAL HIGH (ref 0.44–1.00)
GFR calc Af Amer: >60 mL/min (ref >60)
GFR calc non Af Amer: >60 mL/min (ref >60)
Glucose, Bld: 262 mg/dL — ABNORMAL HIGH (ref 70–99)
Potassium: 4 mmol/L (ref 3.5–5.1)
Sodium: 135 mmol/L (ref 135–145)

## 2018-03-26 LAB — HIV ANTIBODY (ROUTINE TESTING W REFLEX): HIV Screen 4th Generation wRfx: NONREACTIVE

## 2018-03-26 LAB — ANA W/REFLEX IF POSITIVE: Anti Nuclear Antibody(ANA): POSITIVE — AB

## 2018-03-26 MED ORDER — FUROSEMIDE 40 MG PO TABS
40.0000 mg | ORAL_TABLET | Freq: Two times a day (BID) | ORAL | Status: DC
  Administered 2018-03-26 – 2018-03-27 (×3): 40 mg via ORAL
  Filled 2018-03-26 (×3): qty 1

## 2018-03-26 NOTE — Progress Notes (Signed)
Pt walked three times in the hallway tonight before going to sleep.  Pt said she is feeling well.

## 2018-03-26 NOTE — Plan of Care (Signed)
  Problem: Clinical Measurements: Goal: Ability to maintain clinical measurements within normal limits will improve Outcome: Progressing Goal: Diagnostic test results will improve Outcome: Progressing Goal: Respiratory complications will improve Outcome: Progressing Goal: Cardiovascular complication will be avoided Outcome: Progressing   Problem: Activity: Goal: Risk for activity intolerance will decrease Outcome: Progressing   Problem: Nutrition: Goal: Adequate nutrition will be maintained Outcome: Progressing   Problem: Elimination: Goal: Will not experience complications related to urinary retention Outcome: Progressing   Problem: Pain Managment: Goal: General experience of comfort will improve Outcome: Progressing   Problem: Safety: Goal: Ability to remain free from injury will improve Outcome: Progressing   Problem: Skin Integrity: Goal: Risk for impaired skin integrity will decrease Outcome: Progressing

## 2018-03-26 NOTE — Progress Notes (Signed)
Progress Note  Patient Name: Tara Bryant Date of Encounter: 03/26/2018  Primary Cardiologist: Carlyle Dolly, MD   Subjective   Feeling better but tired.  No chest pain.  No orthopnea.   Inpatient Medications    Scheduled Meds: . amoxicillin  500 mg Oral TID  . aspirin EC  81 mg Oral Daily  . atorvastatin  10 mg Oral Daily  . famotidine  20 mg Oral BID  . furosemide  40 mg Intravenous BID  . heparin  5,000 Units Subcutaneous Q8H  . insulin aspart  0-15 Units Subcutaneous TID WC  . insulin glargine  60 Units Subcutaneous QHS  . losartan  25 mg Oral Daily  . metoprolol succinate  25 mg Oral Daily  . sodium chloride flush  3 mL Intravenous Q12H   Continuous Infusions: . sodium chloride     PRN Meds: sodium chloride, acetaminophen, benzonatate, ondansetron (ZOFRAN) IV, sodium chloride flush   Vital Signs    Vitals:   03/25/18 2031 03/26/18 0043 03/26/18 0452 03/26/18 0732  BP: 1_0 94/66  Pulse: (!) 101 90 96 93  Resp: _1 Temp: 97.7 F (36.5 C) 98.4 F (36.9 C) 98.1 F (36.7 C)   TempSrc: Oral Oral Oral   SpO2: 98% 98% 99% 99%  Weight:   84.4 kg   Height:        Intake/Output Summary (Last 24 hours) at 03/26/2018 1022 Last data filed at 03/26/2018 0951 Gross per 24 hour  Intake 1200 ml  Output 1600 ml  Net -400 ml   Filed Weights   03/24/18 1739 03/25/18 0450 03/26/18 0452  Weight: 86.6 kg 84.6 kg 84.4 kg    Telemetry    Sinus rhythm, sinus tachycardia.  PVCs. - Personally Reviewed  ECG    N/a  - Personally Reviewed  Physical Exam   VS:  BP 94/66   Pulse 93   Temp 98.1 F (36.7 C) (Oral)   Resp 18   Ht _2  (1.727 m)   Wt 84.4 kg Comment: scale b  SpO2 99%   BMI 28.28 kg/m  , BMI Body mass index is 28.28 kg/m. GENERAL:  Well appearing HEENT: Pupils equal round and reactive, fundi not visualized, oral mucosa unremarkable NECK:  No jugular venous distention, waveform within normal limits, carotid upstroke  brisk and symmetric, no bruits, no thyromegaly LUNGS:  CTAB HEART:  RRR.  PMI not displaced or sustained,S1 and S2 within normal limits, no S3, no S4, no clicks, no rubs, no murmurs ABD:  Flat, positive bowel sounds normal in frequency in pitch, no bruits, no rebound, no guarding, no midline pulsatile mass, no hepatomegaly, no splenomegaly EXT:  2 plus pulses throughout, no edema, no cyanosis no clubbing SKIN:  No rashes no nodules NEURO:  Cranial nerves II through XII grossly intact, motor grossly intact throughout [facility]:  Cognitively intact, oriented to person place and time  Labs    Chemistry Recent Labs  Lab 03/23/18 1031 03/24/18 1927 03/25/18 0733  NA 138  --  142  K 3.8  --  3.1*  CL 102  --  102  CO2 25  --  26  GLUCOSE 271*  --  173*  BUN 13  --  14  CREATININE 0.74 0.85 0.97  CALCIUM 8.9  --  8.7*  GFRNONAA >60 >60 >60  GFRAA >60 >60 >60  ANIONGAP 11  --  14     Hematology Recent Labs  Lab 03/23/18  1031 03/24/18 1927  WBC 7.4 10.0  RBC 4.33 4.70  HGB 12.7 13.8  HCT 40.1 41.8  MCV 92.6 88.9  MCH 29.3 29.4  MCHC 31.7 33.0  RDW 12.8 12.5  PLT 286 313    Cardiac EnzymesNo results for input(s): TROPONINI in the last 168 hours. No results for input(s): TROPIPOC in the last 168 hours.   BNPNo results for input(s): BNP, PROBNP in the last 168 hours.   DDimer No results for input(s): DDIMER in the last 168 hours.   Radiology    No results found.  Cardiac Studies   LHC/RHC 03/24/18:  Prox LAD lesion is 55% stenosed. DFR 0.92  Otherwise angiographically normal coronary arteries.  Hemodynamic findings consistent with severe pulmonary hypertension.  LV end diastolic pressure is severely elevated.  SUMMARY:  Moderate single-vessel disease with roughly 60% mid LAD (DFR 0.92 -not physiologically significant)  Otherwise minimal CAD.  Severely elevated pulmonary wedge pressure and LVEDP consistent with secondary pulmonary pretension from LV failure  (ACUTE COMBINED SYSTOLIC AND DIASTOLIC HEART FAILURE)  Mild-moderately reduced cardiac output/index.  (Output 4.48, Index 2.2.)  RECOMMENDATIONS:  Based on severely elevated pressures, I think is probably best served for her to be admitted for IV diuresis.  Will admit and placed on 80 mg IV twice daily Lasix.  She is on low-dose ARB and beta-blocker which can be titrated up while she is in the hospital.  Continue optimization of medical management.  We will continue lower dose Lantus (60 units as opposed to 75 units nightly) and cover with sliding scale insulin.  Continue to amoxicillin recently diagnosed, this is probably treating what was thought to be pneumonia but was probably more CHF.  Defer to primary team for canceling.  RHC:  RA 10, RV 70/1, PA 80/26, PA mean 45, PCWP 41 LV 128/2, LVEDP 27  Fick CO 4.48, CI 2.2  Echo 03/17/18: Study Conclusions  - Left ventricle: The cavity size was normal. Systolic function was   severely reduced. The estimated ejection fraction was 20%.   Diffuse hypokinesis. Mild posterior wall thickening. The study is   not technically sufficient to allow evaluation of LV diastolic   function. - Mitral valve: Mildly thickened leaflets . - Right ventricle: The cavity size was mildly dilated. Systolic   function was mildly reduced. - Tricuspid valve: There was mild regurgitation. - Pulmonary arteries: PA peak pressure: 47 mm Hg (S). - Systemic veins: The IVC is dilated with normal respiratory   variation. Estimated right atrial pressure is 8 mmHg. - Pericardium, extracardiac: Small circumferential pericardial   effusion. Features were not consistent with tamponade physiology.  Patient Profile     47 y.o. female with diabetes here with acute systolic and diastolic heart failure and non-obstructive CAD.  Assessment & Plan    # Acute systolic and diastolic heart failure: Symptoms started 01/2018.  She doesn't recall any preceding viral  illness but has been treated for upper respiratory infections since her symptoms began.  Each time was associated with discolored sputum.  RA pressure was 10 on cath.  LVEDP was elevated to 27 suggesting she would benefit from more afterload reduction.  We will switch lasix to 69m oral bid.  There is no clear cause for her cardiomyopathy. Thyroid function is normal.  CBC and CMP are unremarkable.  Cardiac MRI read is pending.  There is no FH or preceding viral illness.  No EtOH abuse or poorly controlled HTN.  Losartan was increased to 282m  Continue metoprolol.  ESR is mildly elevated.  This may be due to her current URI, autoimmune disease or myopericarditis.  Ferritin/TIBC normal, therefore it isn't hemochromatosis.  ASA pending.   HIV negative.   # Severe Pulmonary Hypertension: Likely due to left sided heart disease.  Check CT didn't show intrinsic lung disease.  Diuresis, treatment and diagnosis as above.   # Moderate CAD: 55% proximal LAD with insignificant FFR.  Continue atorvastatin and aspirin.   # DM:  Hemoglobin A1c 8.2%.  Would add Jardiance.  For questions or updates, please contact [place] Please consult [email or web address] for contact info under        Signed, Skeet Latch, MD  03/26/2018, 10:22 AM

## 2018-03-27 DIAGNOSIS — Z8249 Family history of ischemic heart disease and other diseases of the circulatory system: Secondary | ICD-10-CM

## 2018-03-27 LAB — BASIC METABOLIC PANEL
Anion gap: 13 (ref 5–15)
BUN: 19 mg/dL (ref 6–20)
CO2: 25 mmol/L (ref 22–32)
Calcium: 9.1 mg/dL (ref 8.9–10.3)
Chloride: 101 mmol/L (ref 98–111)
Creatinine, Ser: 0.96 mg/dL (ref 0.44–1.00)
GFR calc Af Amer: >60 mL/min (ref >60)
GFR calc non Af Amer: >60 mL/min (ref >60)
Glucose, Bld: 135 mg/dL — ABNORMAL HIGH (ref 70–99)
Potassium: 3.7 mmol/L (ref 3.5–5.1)
Sodium: 139 mmol/L (ref 135–145)

## 2018-03-27 LAB — GLUCOSE, CAPILLARY
Glucose-Capillary: 151 mg/dL — ABNORMAL HIGH (ref 70–99)
Glucose-Capillary: 250 mg/dL — ABNORMAL HIGH (ref 70–99)
Glucose-Capillary: 141 mg/dL — ABNORMAL HIGH (ref 70–99)

## 2018-03-27 MED ORDER — METOPROLOL SUCCINATE ER 25 MG PO TB24: 37.5000 mg | ORAL_TABLET | Freq: Every day | ORAL | Status: DC

## 2018-03-27 MED ORDER — METOPROLOL SUCCINATE ER 25 MG PO TB24: 25.0000 mg | ORAL_TABLET | Freq: Once | ORAL | Status: DC

## 2018-03-27 MED ORDER — FUROSEMIDE 40 MG PO TABS: 40.0000 mg | ORAL_TABLET | Freq: Two times a day (BID) | ORAL | 6 refills | Status: DC

## 2018-03-27 MED ORDER — LOSARTAN POTASSIUM 25 MG PO TABS: 25.0000 mg | ORAL_TABLET | Freq: Every day | ORAL | 3 refills | Status: DC

## 2018-03-27 MED ORDER — APIXABAN 5 MG PO TABS: 5.0000 mg | ORAL_TABLET | Freq: Two times a day (BID) | ORAL | 3 refills | Status: DC

## 2018-03-27 MED ORDER — METOPROLOL SUCCINATE ER 25 MG PO TB24
12.5000 mg | ORAL_TABLET | Freq: Once | ORAL | Status: AC
  Administered 2018-03-27: 12.5 mg via ORAL
  Filled 2018-03-27: qty 1

## 2018-03-27 MED ORDER — METOPROLOL SUCCINATE ER 25 MG PO TB24: 37.5000 mg | ORAL_TABLET | Freq: Every day | ORAL | 11 refills | Status: DC

## 2018-03-27 MED ORDER — METOPROLOL SUCCINATE ER 50 MG PO TB24: 50.0000 mg | ORAL_TABLET | Freq: Every day | ORAL | Status: DC

## 2018-03-27 NOTE — Progress Notes (Signed)
Received message from [place] with [facility]; patient is to have a Life Vest prior to dc home; CM will continue to follow for progression of care. Mindi Slicker [facility] [phone]

## 2018-03-27 NOTE — Care Management (Signed)
ED CM received call from [facility] on [place] concerning the delivery of  Life vest.  ED CM contacted Elane Fritz [phone] LVM for return call.  Wendi Maya RN BS [phone]

## 2018-03-27 NOTE — Care Management (Addendum)
CM contacted [place] office, and spoke with Mia Rep [phone].Was informed that Claiborne Billings Rep from [employer] is on the way tonight, patient and Nsg Staff were made aware.

## 2018-03-27 NOTE — Discharge Summary (Signed)
Discharge Summary    [name] MRN: [id number]; DOB: Jun 28, 1970  Admit date: 03/24/2018 Discharge date: 03/27/2018  Primary Care Provider: Mikey Kirschner, MD  Primary Cardiologist: Carlyle Dolly, MD   Discharge Diagnoses    Principal Problem:   Acute combined systolic and diastolic heart failure [facility]) Active Problems:   Essential hypertension, benign   Type 2 diabetes mellitus without complication, with long-term current use of insulin ([place])   Dilated cardiomyopathy ([place]): EF 20%, global hypokinesis.   Family history of left ventricular noncompaction  Allergies Allergies  Allergen Reactions  . Aspartame And Phenylalanine    Diagnostic Studies/Procedures    Cardiac catheterization 03/24/18:   Prox LAD lesion is 55% stenosed. DFR 0.92  Otherwise angiographically normal coronary arteries.  Hemodynamic findings consistent with severe pulmonary hypertension.  LV end diastolic pressure is severely elevated.   SUMMARY:  Moderate single-vessel disease with roughly 60% mid LAD (DFR 0.92 -not physiologically significant)  Otherwise minimal CAD.  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  (Output 4.48, Index 2.2.)  RECOMMENDATIONS:  Based on severely elevated pressures, I think is probably best served for her to be admitted for IV diuresis.  Will admit and placed on 80 mg IV twice daily Lasix.  She is on low-dose ARB and beta-blocker which can be titrated up while she is in the hospital.  Continue optimization of medical management.  We will continue lower dose Lantus (60 units as opposed to 75 units nightly) and cover with sliding scale insulin.  Continue to amoxicillin recently diagnosed, this is probably treating what was thought to be pneumonia but was probably more  CHF.  Defer to primary team for canceling.    Cardiac MRI 03/25/18:  IMPRESSION: 1. Moderately dilated left ventricle with mild concentric hypertrophy and severe systolic function (LVEF = 27%). Severe diffuse hypokinesis and paradoxical septal motion. There is no late gadolinium enhancement in the left ventricular myocardium. There is significant hyper trabeculation of the left ventricle with ratio of non-compacted to compacted myocardium > 4:1.  2. Normal right ventricular size, thickness and moderate systolic dysfunction (LVEF = 30%). There are no regional wall motion abnormalities.  3.  Normal left and right atrial size.  4. Normal size of the aortic root, ascending aorta and pulmonary artery.  5.  Trivial mitral and mild tricuspid regurgitation.  6. Normal pericardium. Mild circumferential pericardial effusion, moderate amount inferior to the heart (up to 11 mm).  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   Echocardiogram 03/17/2018: Study Conclusions  - Left ventricle: The cavity size was normal. Systolic function was   severely reduced. The estimated ejection fraction was 20%.   Diffuse hypokinesis. Mild posterior wall thickening. The study is   not technically sufficient to allow evaluation of LV diastolic   function. - Mitral valve: Mildly thickened leaflets . - Right ventricle: The cavity size was mildly dilated. Systolic   function was mildly reduced. - Tricuspid valve: There was mild regurgitation. - Pulmonary arteries: PA peak pressure: 47 mm Hg (S). - Systemic veins: The IVC is dilated with normal respiratory   variation. Estimated right atrial pressure is 8 mmHg. - Pericardium, extracardiac: Small circumferential pericardial   effusion. Features were not consistent with tamponade physiology.  History of Present Illness     Tara Bryant is a 47 y.o.female with prior PMH  which includes HTN, insulin dependent DM, depression and hx of SVT who was initially seen by Dr. Harl Bowie on 03/17/18 as a referral from her PCP. It appears that she began having symptoms of exertional SOB around Thanksgiving and she was started on PO Lasix by her PCP with referral to Cardiology. There was initial concern for PE, therefore imaging was performed which was negative however she had evidence of pulmonary edema (started on Lasix). She stated that she had been having difficulty with dyspnea with her normal everyday activities including pulling the trash can to the house or walking up the stairs. She also reported diffuse muscle aches in her chest that were worse with palpation.  At the end of October she was treated for rhino sinusitis given a progressive cough. During her office visit with Dr. Harl Bowie, an echocardiogram was obtained which showed and LVEF of approximately 20%. EKG with SR and nonspecific ST/T changes. She reported no prior history of heart disease or ETOH/illicit drug abuse.  She was started on Toprol 12.32m daily and her lasix was increased to 444mdaily at a subsequent office visit with Dr. BrHarl Bowieor follow up of her echo on 03/19/18. Her weight was noted to be down from 200lbs to 196lbs and was tolerating medications well. She was initially on lisinopril however developed a cough and this was changes to losartan. Given the echocardiogram results [facility]  Hospital Course   Her cardiac cath revealed moderate single-vessel disease with roughly 60% mid LAD stenosis with otherwise minimal CAD. 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   Based on her severely elevated pressures, Dr. HaEllyn Hackelt it was probably best served for her to be admitted for  IV diuresis with 80 mg IV twice daily of Lasix. Recommendations were to continue with optimization of medical management.  Her Lasix was then reduced to 40 mg IV twice daily.  At that time, there was no clear cause for her cardiomyopathy.  And was then for a cardiac MRI to look for infiltrative cardiomyopathy.  ESR, ANA, ferritin, TIBC and HIV were also sent.  ANA came back positive and sed rate elevated at 39.  Unfortunately her cardiac MRI showed biventricular non-compaction with an LVEF of 21% and RVEF of 30%.  There was no late gadolinium enhancement.  Due to sustained sinus tachycardia, her metoprolol succinate was increased to 37.5 mg with plans for adding ivabradine if HR does not improve in the outpatient setting.  Per chart review, ideally losartan would be transition [facility] if BP can tolerate further adjustments.  Dr. RaOval Linseyad a long discussion about the prognosis and implications with patient and her family member.  She will need genetic testing/counselor and have first-degree family member screened with an echocardiogram.  Dr. RaOval Linseylso discussed the pros and cons of anticoagulation which she will start on Eliquis 5 mg twice daily.  Patient reported she had a sister who had a stroke and is in strong favor of prevention.  She will be fitted with a Life Vest prior to discharge for 3 months duration with follow-up repeat echocardiogram for consideration of ICD at that time.  Close follow-up with Dr. BrHarl Bowieill be made.  She was also encouraged to follow-up with her PCP and consider adding an SGLT2 inhibitor for CV and mortality  benefit.   -Creatinine on discharge, 0.96 -Potassium stable at 3.7 -Weight, 186.6lb -BPs have been running soft with systolic BPs in the high 42-595 range however she is been asymptomatic>> will need to monitor BPs in the outpatient setting with the addition of HF medications  Other hospital problems include:  # Severe Pulmonary  Hypertension: -Likely due to left sided heart disease.  CT did not show intrinsic lung disease.  # Moderate CAD: -55% proximal LAD with insignificant FFR.   -Continue atorvastatin and add aspirin.   # DM:  -Hemoglobin A1c 8.2%.  Would add Jardiance per PCP   Consultants: None  Patient was seen and examined by Dr. Oval Linsey who feels that she is stable and ready for discharge today, 03/27/2018 _____________  Discharge Vitals Blood pressure 98/65, pulse 95, temperature 98.1 F (36.7 C), temperature source Oral, resp. rate 18, height 5' 8"  (1.727 m), weight 84.6 kg, SpO2 99 %.  Filed Weights   03/25/18 0450 03/26/18 0452 03/27/18 0438  Weight: 84.6 kg 84.4 kg 84.6 kg   Labs & Radiologic Studies    CBC No results for input(s): WBC, NEUTROABS, HGB, HCT, MCV, PLT in the last 72 hours. Basic Metabolic Panel Recent Labs    03/26/18 1029 03/27/18 0522  NA 135 139  K 4.0 3.7  CL 98 101  CO2 25 25  GLUCOSE 262* 135*  BUN 21* 19  CREATININE 1.08* 0.96  CALCIUM 9.1 9.1    Hemoglobin A1C Recent Labs    03/25/18 0441  HGBA1C 8.2*   Ct Angio Chest W/cm &/or Wo Cm  Result Date: 03/13/2018 CLINICAL DATA:  47 year old female with cough, elevated D-dimer and congestion. EXAM: CT ANGIOGRAPHY CHEST WITH CONTRAST TECHNIQUE: Multidetector CT imaging of the chest was performed using the standard protocol during bolus administration of intravenous contrast. Multiplanar CT image reconstructions and MIPs were obtained to evaluate the vascular anatomy. CONTRAST:  30m ISOVUE-370 IOPAMIDOL (ISOVUE-370) INJECTION 76% COMPARISON:  01/12/2018 chest radiograph FINDINGS: Cardiovascular: This is a technically satisfactory study. No pulmonary emboli are identified. Cardiomegaly is identified. A small pericardial effusion is noted. There is no evidence of thoracic aortic aneurysm. Mediastinum/Nodes: A probable 2 cm LEFT thyroid nodule noted. No other mediastinal mass or enlarged lymph nodes identified.  Lungs/Pleura: Small to moderate bilateral pleural effusions are identified. Central ground-glass opacities and mild interlobular septal thickening noted suggesting pulmonary edema. No airspace opacities, consolidation, mass or pneumothorax noted. Upper Abdomen: No acute abnormality. Musculoskeletal: No acute or suspicious bony abnormalities identified. Review of the MIP images confirms the above findings. IMPRESSION: 1. Central ground-glass and mild interstitial opacities bilaterally suggestive of pulmonary edema. 2. Cardiomegaly, small to moderate bilateral pleural effusions and small pericardial effusion. 3. No evidence of pulmonary emboli or thoracic aortic aneurysm 4. Probable 2 cm LEFT thyroid nodule. Ultrasound follow-up recommended. This follows ACR consensus guidelines: Managing Incidental Thyroid Nodules Detected on Imaging: White Paper of the ACR Incidental Thyroid Findings Committee. J Am Coll Radiol 2015; 12:143-150. Electronically Signed   By: JMargarette CanadaM.D.   On: 03/13/2018 15:03   Mr Cardiac Morphology W Wo Contrast  Result Date: 03/26/2018 CLINICAL DATA:  47year old female with new acute systolic and diastolic CHF and non-obstructive CAD. EXAM: CARDIAC MRI TECHNIQUE: The patient was scanned on a 1.5 Tesla GE magnet. A dedicated cardiac coil was used. Functional imaging was done using Fiesta sequences. 2,3, and 4 chamber views were done to assess for RWMA's. Modified Simpson's rule using a short axis stack was used to calculate  an ejection fraction on a dedicated work Conservation officer, nature. The patient received 8 cc of Gadavist. After 10 minutes inversion recovery sequences were used to assess for infiltration and scar tissue. CONTRAST:  8 cc  of Gadavist FINDINGS: 1. Moderately dilated left ventricle with mild concentric hypertrophy and severe systolic function (LVEF = 27%). Severe diffuse hypokinesis and paradoxical septal motion. There is no late gadolinium enhancement in the left  ventricular myocardium. There is significant hyper trabeculation of the left ventricle with ratio of non-compacted to compacted myocardium > 4:1. LVEDD: 61 mm LVESD: 51 mm LVEDV: 170 ml LVESV: 124 ml SV: 46 ml CO: 4.6 L/min Myocardial mass: 166 g 2. Normal right ventricular size, thickness and moderate systolic dysfunction (LVEF = 30%). There are no regional wall motion abnormalities. 3.  Normal left and right atrial size. 4. Normal size of the aortic root, ascending aorta and pulmonary artery. 5.  Trivial mitral and mild tricuspid regurgitation. 6. Normal pericardium. Mild circumferential pericardial effusion, moderate amount inferior to the heart (up to 11 mm). IMPRESSION: 1. Moderately dilated left ventricle with mild concentric hypertrophy and severe systolic function (LVEF = 27%). Severe diffuse hypokinesis and paradoxical septal motion. There is no late gadolinium enhancement in the left ventricular myocardium. There is significant hyper trabeculation of the left ventricle with ratio of non-compacted to compacted myocardium > 4:1. 2. Normal right ventricular size, thickness and moderate systolic dysfunction (LVEF = 30%). There are no regional wall motion abnormalities. 3.  Normal left and right atrial size. 4. Normal size of the aortic root, ascending aorta and pulmonary artery. 5.  Trivial mitral and mild tricuspid regurgitation. 6. Normal pericardium. Mild circumferential pericardial effusion, moderate amount inferior to the heart (up to 11 mm). 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 Electronically Signed   By: Ena Dawley   On: 03/26/2018 10:54   US Thyroid  Result Date: 03/19/2018 CLINICAL DATA:  Nodule incidentally noted on CT chest EXAM: THYROID ULTRASOUND TECHNIQUE: Ultrasound examination of the thyroid gland and adjacent soft tissues was performed. COMPARISON:  CT 03/13/2018 FINDINGS: Parenchymal Echotexture: Mildly  heterogenous Isthmus: 0.3 cm thickness Right lobe: 4.8 x 2 x 1.6 cm Left lobe: 5.6 x 2.8 x 2.3 cm _________________________________________________________ Estimated total number of nodules >/= 1 cm: 2 Number of spongiform nodules >/=  2 cm not described below (TR1): 0 Number of mixed cystic and solid nodules >/= 1.5 cm not described below ([place]): 0 _________________________________________________________ Nodule # 1: Location: Right; Mid Maximum size: 1.7 cm; Other 2 dimensions: 1 x 1.2 cm Composition: solid/almost completely solid (2) Echogenicity: hyperechoic (1) Shape: taller-than-wide (3) Margins: ill-defined (0) Echogenic foci: none (0) ACR TI-RADS total points: 6. ACR TI-RADS risk category: TR4 (4-6 points). ACR TI-RADS recommendations: **Given size (>/= 1.5 cm) and appearance, fine needle aspiration of this moderately suspicious nodule should be considered based on TI-RADS criteria. _________________________________________________________ Nodule # 2: Location: Left; Mid Maximum size: 3.2 cm; Other 2 dimensions: 2.5 x 2 cm Composition: solid/almost completely solid (2) Echogenicity: hypoechoic (2) Shape: not taller-than-wide (0) Margins: ill-defined (0) Echogenic foci: none (0) ACR TI-RADS total points: 4. ACR TI-RADS risk category: TR4 (4-6 points). ACR TI-RADS recommendations: **Given size (>/= 1.5 cm) and appearance, fine needle aspiration of this moderately suspicious nodule should be considered based on TI-RADS criteria. _________________________________________________________ Additional scattered cystic/hypoechoic lesions less than 0.5 cm bilaterally. IMPRESSION: 1. Mild cardiomegaly with bilateral nodules. 2. Recommend FNA biopsy of BOTH the 1.7 cm mid right  AND the 3.2 cm mid left moderately suspicious nodules. The above is in keeping with the ACR TI-RADS recommendations - J Am Coll Radiol 2017;14:587-595. Electronically Signed   By: Lucrezia Europe M.D.   On: 03/19/2018 14:27   Disposition   Pt is  being discharged home today in good condition.  Follow-up Plans & Appointments    Follow-up Information    Almyra Deforest, [place] Follow up on 04/03/2018.   Specialties:  Cardiology, Radiology Why:  Your follow-up appointment will be at our [facility] office as there were no close follow-up appointments available in [place].  Your appointment will be 04/03/2017 at 11 AM.  Your subsequent appointments should be in the [place] office.  Contact information: [address] [place] [address] [place], [place] M, PA-C Follow up on 04/16/2018.   Specialties:  Physician Assistant, Cardiology Why:  Your follow-up appointment will be on 04/16/2018 at 2 PM Contact information: [place] [address] [phone]          Discharge Instructions    ([place]) Call MD:  Anytime you have any of the following symptoms: 1) 3 pound weight gain in 24 hours or 5 pounds in 1 week 2) shortness of breath, with or without a dry hacking cough 3) swelling in the hands, feet or stomach 4) if you have to sleep on extra pillows at night in order to breathe.   Complete by:  As directed    Diet - low sodium heart healthy   Complete by:  As directed    Increase activity slowly   Complete by:  As directed      Discharge Medications   Allergies as of 03/27/2018      Reactions   Aspartame And Phenylalanine       Medication List    TAKE these medications   amoxicillin 500 MG capsule Commonly known as:  AMOXIL Take 1 capsule (500 mg total) by mouth 3 (three) times daily.   apixaban 5 MG Tabs tablet Commonly known as:  ELIQUIS Take 1 tablet (5 mg total) by mouth 2 (two) times daily.   atorvastatin 10 MG tablet Commonly known as:  LIPITOR Take 1 tablet (10 mg total) by mouth daily. For cholesterol   B-D ULTRAFINE III SHORT PEN 31G X 8 MM Misc Generic drug:  Insulin Pen Needle Use twice a daily to inject insulin   B-D ULTRAFINE III  SHORT PEN 31G X 8 MM Misc Generic drug:  Insulin Pen Needle USE FOR INSULIN INJECTIONS TWICE DAILY   benzonatate 100 MG capsule Commonly known as:  TESSALON Take 1 capsule (100 mg total) by mouth 3 (three) times daily as needed for cough.   CONTOUR NEXT TEST test strip Generic drug:  glucose blood USE TO TEST BLOOD SUGAR TWICE DAILY AS DIRECTED   famotidine 20 MG tablet Commonly known as:  PEPCID Take 1 tablet (20 mg total) by mouth 2 (two) times daily.   furosemide 40 MG tablet Commonly known as:  LASIX Take 1 tablet (40 mg total) by mouth 2 (two) times daily. Start taking on:  March 28, 2018 What changed:  when to take this   glipiZIDE 5 MG tablet Commonly known as:  GLUCOTROL TAKE ONE HALF TABLET BID   Insulin Glargine 100 UNIT/ML Solostar Pen Commonly known as:  LANTUS SOLOSTAR ADMINISTER 75 UNITS UNDER THE SKIN EVERY NIGHT AT BEDTIME   losartan 25 MG tablet  Commonly known as:  COZAAR Take 1 tablet (25 mg total) by mouth daily. Start taking on:  March 28, 2018 What changed:  how much to take   metFORMIN 500 MG tablet Commonly known as:  GLUCOPHAGE Take 1 tablet (500 mg total) by mouth 2 (two) times daily with a meal.   metoprolol succinate 25 MG 24 hr tablet Commonly known as:  TOPROL XL Take 1.5 tablets (37.5 mg total) by mouth daily. Start taking on:  March 28, 2018 What changed:  how much to take   NORLYDA 0.35 MG tablet Generic drug:  norethindrone Take 1 tablet by mouth daily.        Acute coronary syndrome (MI, NSTEMI, STEMI, etc) this admission?: No.    Outstanding Labs/Studies   Genetic counseling/testing referral  BMET to assess renal function   Duration of Discharge Encounter   Greater than 30 minutes including physician time.  Note initiated by Kathyrn Drown, NP  Signed, Daune Perch, NP 03/27/2018, 7:57 PM

## 2018-03-27 NOTE — Progress Notes (Addendum)
Progress Note  Patient Name: Tara Bryant Date of Encounter: 03/27/2018  Primary Cardiologist:  Carlyle Dolly, MD   Subjective   Denies chest pain. Appears to be moderately SOB with communication this AM. Not fluid volume overloaded   Inpatient Medications    Scheduled Meds: . amoxicillin  500 mg Oral TID  . aspirin EC  81 mg Oral Daily  . atorvastatin  10 mg Oral Daily  . famotidine  20 mg Oral BID  . furosemide  40 mg Oral BID  . heparin  5,000 Units Subcutaneous Q8H  . insulin aspart  0-15 Units Subcutaneous TID WC  . insulin glargine  60 Units Subcutaneous QHS  . losartan  25 mg Oral Daily  . metoprolol succinate  25 mg Oral Daily  . sodium chloride flush  3 mL Intravenous Q12H   Continuous Infusions: . sodium chloride     PRN Meds: sodium chloride, acetaminophen, benzonatate, ondansetron (ZOFRAN) IV, sodium chloride flush   Vital Signs    Vitals:   03/26/18 2012 03/26/18 2100 03/27/18 0438 03/27/18 0903  BP: 91/64 94/69 93/65  103/67  Pulse: 96 96 88 (!) 103  Resp: 18  18   Temp: 98.1 F (36.7 C)  98.1 F (36.7 C)   TempSrc: Oral  Oral   SpO2: 100%  98% 100%  Weight:   84.6 kg   Height:        Intake/Output Summary (Last 24 hours) at 03/27/2018 0915 Last data filed at 03/27/2018 0258 Gross per 24 hour  Intake 580 ml  Output 1150 ml  Net -570 ml   Filed Weights   03/25/18 0450 03/26/18 0452 03/27/18 0438  Weight: 84.6 kg 84.4 kg 84.6 kg   Physical Exam   General: Well developed, well nourished, NAD Skin: Warm, dry, intact  Head: Normocephalic, atraumatic, clear, moist mucus membranes. Neck: Negative for carotid bruits. No JVD Lungs: Right sided inspiratory wheezing. Breathing is labored with speaking  Cardiovascular: RRR with S1 S2. No murmurs, rubs, gallops, or LV heave appreciated. Abdomen: Soft, non-tender, non-distended with normoactive bowel sounds. No hepatomegaly, No rebound/guarding. No obvious abdominal masses. MSK:  Strength and tone appear normal for age. 5/5 in all extremities Extremities: No edema. No clubbing or cyanosis. DP/PT pulses 2+ bilaterally Neuro: Alert and oriented. No focal deficits. No facial asymmetry. MAE spontaneously. Psych: Responds to questions appropriately with normal affect.    Labs    Chemistry Recent Labs  Lab 03/25/18 0733 03/26/18 1029 03/27/18 0522  NA 142 135 139  K 3.1* 4.0 3.7  CL 102 98 101  CO2 26 25 25   GLUCOSE 173* 262* 135*  BUN 14 21* 19  CREATININE 0.97 1.08* 0.96  CALCIUM 8.7* 9.1 9.1  GFRNONAA >60 >60 >60  GFRAA >60 >60 >60  ANIONGAP 14 12 13      Hematology Recent Labs  Lab 03/23/18 1031 03/24/18 1927  WBC 7.4 10.0  RBC 4.33 4.70  HGB 12.7 13.8  HCT 40.1 41.8  MCV 92.6 88.9  MCH 29.3 29.4  MCHC 31.7 33.0  RDW 12.8 12.5  PLT 286 313    Cardiac EnzymesNo results for input(s): TROPONINI in the last 168 hours. No results for input(s): TROPIPOC in the last 168 hours.   BNPNo results for input(s): BNP, PROBNP in the last 168 hours.   DDimer No results for input(s): DDIMER in the last 168 hours.   Radiology    Mr Cardiac Morphology W Wo Contrast  Result Date: 03/26/2018 CLINICAL  DATA:  47 year old female with new acute systolic and diastolic CHF and non-obstructive CAD. EXAM: CARDIAC MRI TECHNIQUE: The patient was scanned on a 1.5 Tesla GE magnet. A dedicated cardiac coil was used. Functional imaging was done using Fiesta sequences. 2,3, and 4 chamber views were done to assess for RWMA's. Modified Simpson's rule using a short axis stack was used to calculate an ejection fraction on a dedicated work Conservation officer, nature. The patient received 8 cc of Gadavist. After 10 minutes inversion recovery sequences were used to assess for infiltration and scar tissue. CONTRAST:  8 cc  of Gadavist FINDINGS: 1. Moderately dilated left ventricle with mild concentric hypertrophy and severe systolic function (LVEF = 27%). Severe diffuse hypokinesis  and paradoxical septal motion. There is no late gadolinium enhancement in the left ventricular myocardium. There is significant hyper trabeculation of the left ventricle with ratio of non-compacted to compacted myocardium > 4:1. LVEDD: 61 mm LVESD: 51 mm LVEDV: 170 ml LVESV: 124 ml SV: 46 ml CO: 4.6 L/min Myocardial mass: 166 g 2. Normal right ventricular size, thickness and moderate systolic dysfunction (LVEF = 30%). There are no regional wall motion abnormalities. 3.  Normal left and right atrial size. 4. Normal size of the aortic root, ascending aorta and pulmonary artery. 5.  Trivial mitral and mild tricuspid regurgitation. 6. Normal pericardium. Mild circumferential pericardial effusion, moderate amount inferior to the heart (up to 11 mm). IMPRESSION: 1. Moderately dilated left ventricle with mild concentric hypertrophy and severe systolic function (LVEF = 27%). Severe diffuse hypokinesis and paradoxical septal motion. There is no late gadolinium enhancement in the left ventricular myocardium. There is significant hyper trabeculation of the left ventricle with ratio of non-compacted to compacted myocardium > 4:1. 2. Normal right ventricular size, thickness and moderate systolic dysfunction (LVEF = 30%). There are no regional wall motion abnormalities. 3.  Normal left and right atrial size. 4. Normal size of the aortic root, ascending aorta and pulmonary artery. 5.  Trivial mitral and mild tricuspid regurgitation. 6. Normal pericardium. Mild circumferential pericardial effusion, moderate amount inferior to the heart (up to 11 mm). 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 Electronically Signed   By: Ena Dawley   On: 03/26/2018 10:54   Telemetry    03/27/18 NSR - Personally Reviewed  ECG    No new tracings as of 03/27/18 - Personally Reviewed  Cardiac Studies   LHC/RHC 03/24/18:  Prox LAD lesion is 55% stenosed. DFR  0.92  Otherwise angiographically normal coronary arteries.  Hemodynamic findings consistent with severe pulmonary hypertension.  LV end diastolic pressure is severely elevated.  SUMMARY:  Moderate single-vessel disease with roughly 60% mid LAD (DFR 0.92 -not physiologically significant)  Otherwise minimal CAD.  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 (Output 4.48, Index 2.2.)  RECOMMENDATIONS:  Based on severely elevated pressures, I think is probably best served for her to be admitted for IV diuresis. Will admit and placed on 80 mg IV twice daily Lasix.  She is on low-dose ARB and beta-blocker which can be titrated up while she is in the hospital.  Continue optimization of medical management.  We will continue lower dose Lantus (60 units as opposed to 75 units nightly) and cover with sliding scale insulin.  Continue to amoxicillin recently diagnosed, this is probably treating what was thought to be pneumonia but  was probably more CHF. Defer to primary team for canceling.  RHC: RA 10, RV 70/1, PA 80/26, PA mean 45, PCWP 41 LV 128/2, LVEDP 27  Fick CO 4.48, CI 2.2  Echo 03/17/18: Study Conclusions  - Left ventricle: The cavity size was normal. Systolic function was severely reduced. The estimated ejection fraction was 20%. Diffuse hypokinesis. Mild posterior wall thickening. The study is not technically sufficient to allow evaluation of LV diastolic function. - Mitral valve: Mildly thickened leaflets . - Right ventricle: The cavity size was mildly dilated. Systolic function was mildly reduced. - Tricuspid valve: There was mild regurgitation. - Pulmonary arteries: PA peak pressure: 47 mm Hg (S). - Systemic veins: The IVC is dilated with normal respiratory variation.  Estimated right atrial pressure is 8 mmHg. - Pericardium, extracardiac: Small circumferential pericardial effusion. Features were not consistent with tamponade physiology.  Patient Profile     47 y.o. female with diabetes here with acute systolic and diastolic heart failure and non-obstructive CAD.  Assessment & Plan    1. 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 Severe Pulmonary Hypertension: -Likely due to left sided heart disease -Managed with diuresis as above   3.  Moderate CAD: -Per cath report there was 55% proximal LAD with insignificant FFR. -Denies chest pain or other ACS symptoms  -Continue atorvastatin and aspirin   4, DM:  -Hemoglobin A1c 8.2% -SSI for glucose control while inpatient status   Signed, Kathyrn Drown NP-C [facility] Pager: [phone] 03/27/2018, 9:15 AM     For questions or updates, please contact   Please consult [email or web address] for contact info under Cardiology/STEMI.

## 2018-03-27 NOTE — Progress Notes (Signed)
Pt being discharge per MD order, discharge instructions given, questions answered. D/C in stable condition.

## 2018-03-27 NOTE — Progress Notes (Signed)
Inpatient Diabetes Program Recommendations  AACE/ADA: New Consensus Statement on Inpatient Glycemic Control (2015)  Target Ranges:  Prepandial:   less than 140 mg/dL      Peak postprandial:   less than 180 mg/dL (1-2 hours)      Critically ill patients:  140 - 180 mg/dL   Results for Tara Bryant, Tara Bryant (MRN [id number]) as of 03/27/2018 09:05  Ref. Range 03/26/2018 07:32 03/26/2018 11:07 03/26/2018 16:38 03/26/2018 21:18  Glucose-Capillary Latest Ref Range: 70 - 99 mg/dL 189 (H)  3 units NOVOLOG  234 (H)  5 units NOVOLOG  182 (H)  3 units NOVOLOG  205 (H)    60 units LANTUS     Home DM Meds: Lantus 75 units QHS       Glipizide 2.5 mg BID       Metformin 500 mg BID  Current Orders: Lantus 60 units QHS       Novolog Moderate Correction Scale/ SSI (0-15 units) TID AC     MD- Please consider starting Novolog Meal Coverage while home DM meds are on hold:  Novolog 3 units TID with meals  (Please add the following Hold Parameters: Hold if pt eats <50% of meal, Hold if pt NPO)     --Will follow patient during hospitalization--  Wyn Quaker RN, MSN, CDE Diabetes Coordinator Inpatient Glycemic Control Team Team Pager: [phone] (8a-5p)

## 2018-03-30 ENCOUNTER — Encounter: Payer: Self-pay | Admitting: Family Medicine

## 2018-04-03 ENCOUNTER — Other Ambulatory Visit: Payer: Self-pay | Admitting: Family Medicine

## 2018-04-03 ENCOUNTER — Encounter: Payer: Self-pay | Admitting: Physician Assistant

## 2018-04-03 ENCOUNTER — Ambulatory Visit (INDEPENDENT_AMBULATORY_CARE_PROVIDER_SITE_OTHER): Payer: BLUE CROSS/BLUE SHIELD | Admitting: Physician Assistant

## 2018-04-03 VITALS — BP 96/70 | HR 91 | Ht 68.0 in | Wt 188.2 lb

## 2018-04-03 DIAGNOSIS — I2510 Atherosclerotic heart disease of native coronary artery without angina pectoris: Secondary | ICD-10-CM

## 2018-04-03 DIAGNOSIS — I429 Cardiomyopathy, unspecified: Secondary | ICD-10-CM | POA: Diagnosis not present

## 2018-04-03 DIAGNOSIS — I10 Essential (primary) hypertension: Secondary | ICD-10-CM | POA: Diagnosis not present

## 2018-04-03 DIAGNOSIS — I5022 Chronic systolic (congestive) heart failure: Secondary | ICD-10-CM | POA: Diagnosis not present

## 2018-04-03 DIAGNOSIS — E119 Type 2 diabetes mellitus without complications: Secondary | ICD-10-CM

## 2018-04-03 DIAGNOSIS — Z794 Long term (current) use of insulin: Secondary | ICD-10-CM

## 2018-04-03 MED ORDER — SACUBITRIL-VALSARTAN 24-26 MG PO TABS: 1.0000 | ORAL_TABLET | Freq: Two times a day (BID) | ORAL | 3 refills | Status: DC

## 2018-04-03 MED ORDER — SACUBITRIL-VALSARTAN 24-26 MG PO TABS: ORAL_TABLET | ORAL | 3 refills | Status: DC

## 2018-04-03 MED ORDER — IVABRADINE HCL 5 MG PO TABS: 2.5000 mg | ORAL_TABLET | Freq: Two times a day (BID) | ORAL | 3 refills | Status: DC

## 2018-04-03 MED ORDER — FUROSEMIDE 40 MG PO TABS: 40.0000 mg | ORAL_TABLET | Freq: Every day | ORAL | 6 refills | Status: DC

## 2018-04-03 NOTE — Patient Instructions (Addendum)
Medication Instructions:  Stop Losartan 25 mg. START Entresto 24/26 twice daily. START Corlanor 2.5 mg twice daily. (cut 5 mg in half) DECREASE Lasix to 40 mg daily. If you need a refill on your cardiac medications before your next appointment, please call your pharmacy.   Lab work: CBC, BMET today BMET in one week at [facility] in [place].  If you have labs (blood work) drawn today and your tests are completely normal, you will receive your results only by: Marland Kitchen MyChart Message (if you have MyChart) OR . A paper copy in the mail If you have any lab test that is abnormal or we need to change your treatment, we will call you to review the results.  Testing/Procedures: Echocardiogram (3 months) - Your physician has requested that you have an echocardiogram. Echocardiography is a painless test that uses sound waves to create images of your heart. It provides your doctor with information about the size and shape of your heart and how well your heart's chambers and valves are working. This procedure takes approximately one hour. There are no restrictions for this procedure. This will be performed at our [facility] location - [address].   Follow-Up: At [facility], you and your health needs are our priority.  As part of our continuing mission to provide you with exceptional heart care, we have created designated Provider Care Teams.  These Care Teams include your primary Cardiologist (physician) and Advanced Practice Providers (APPs -  Physician Assistants and Nurse Practitioners) who all work together to provide you with the care you need, when you need it. Marland Kitchen Keep Follow up appointment in [place] as scheduled.  Any Other Special Instructions Will Be Listed Below (If Applicable). Continue daily weights and BP diary. Please call if systolic number drops below 90.    Heart Failure Prevention plan: 1. Limit salt intake 2. Keep daily fluid intake to between 32-64 oz 3. Weigh  yourself every morning, call us if weight increase by more than 3 lbs overnight or 5 lbs in a single week

## 2018-04-03 NOTE — Progress Notes (Addendum)
Cardiology Office Note    Date:  04/05/2018   ID:  Tara Bryant, DOB [birth year], MRN [id number]  PCP:  Mikey Kirschner, MD  Cardiologist: Dr. Harl Bowie  Chief Complaint  Patient presents with  . Follow-up    seen for Dr. Harl Bowie    History of Present Illness:  Tara Bryant is a 47 y.o. female with past medical history of hypertension and DM 2 who was recently seen by Dr. Harl Bowie as a new patient on 85/27/7824 for systolic heart failure.  She had a echocardiogram in December 2019 that revealed a new EF of 20%.  EKG shows sinus rhythm with nonspecific changes.  CT angiogram of the chest was negative for PE but does show show sign of pulmonary edema.  She also complains of diffuse aches in the chest and a muscle ache in October as well.  In early October she was treated with rhinosinusitis.  Patient complained of severe dyspnea with exertion of walking up 1 flight of stairs.  She was started on losartan and was set up for outpatient cardiac catheterization to rule out underlying coronary artery disease.  Left and right heart cath was performed on 03/24/2018 which showed 55% proximal LAD lesion with DFR 0.92, otherwise angiographically normal coronaries.  Medical therapy was recommended.  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  Cardiac output was 4.48 and a cardiac index was 2.2.  Patient was subsequently admitted for IV diuresis.  Hemoglobin A1c was 8.2, Jardiance was added given his cardiac benefits.  Cardiac MRI showed biventricular non-compaction, EF 21%, RVEF 30%, there was no late gadolinium enhancement.  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  Dr. Oval Linsey discussed prognosis and implications with the patient and her family member.  She will need genetic testing/counselor and to have first-degree family member screened with echocardiogram.  She was also started on  Eliquis 5 mg twice daily.  She was fitted with LifeVest prior to discharge with plan to further titrate her heart failure medication and consider 22-month repeat echocardiogram.  If EF is still low at the time, may need ICD.  Patient presents today for post hospital follow-up.  Her blood pressure has been borderline low.  I will attempt to switch her losartan to Entresto 24-26 mg twice daily.  I also added her on the lowest possible dose of ivabradine 2.5 mg twice daily.  This can be titrated up further on follow-up to 5 mg twice daily if she is able to tolerate.  She will continue on Toprol-XL for her LV dysfunction.  She is currently wearing her LifeVest.  She did complain of left thigh burning sensation, this is likely related to nerves as patient has chronic back pain.  She is aware that she will need to contact us if her systolic blood pressure is consistently less than 90 mmHg.  I did decrease her Lasix to 40 mg daily as Delene Loll has very weak diuretic effect.  She will need a basic metabolic panel today and also in 1 week.  She does not have any bleeding issue on Eliquis, I will obtain a CBC.  This is likely the maximum tolerated blood pressure medication she can take, I will time of 33-month repeat echocardiogram.  If her EF is still low at that time, we will need to discuss internal defibrillator.  Past Medical History:  Diagnosis Date  . Arrhythmia   . Depression   . Diabetes mellitus   .  Hypertension   . Microproteinuria     Past Surgical History:  Procedure Laterality Date  . BACK SURGERY    . BREAST BIOPSY Left 2017   benign  . INTRAVASCULAR PRESSURE WIRE/FFR STUDY N/A 03/24/2018   Procedure: INTRAVASCULAR PRESSURE WIRE/FFR STUDY;  Surgeon: Leonie Man, MD;  Location: [facility];  Service: Cardiovascular;  Laterality: N/A;  . NOSE SURGERY    . RIGHT/LEFT HEART CATH AND CORONARY ANGIOGRAPHY N/A 03/24/2018   Procedure: RIGHT/LEFT HEART CATH AND CORONARY ANGIOGRAPHY;   Surgeon: Leonie Man, MD;  Location: [facility];  Service: Cardiovascular;  Laterality: N/A;  . TONSILLECTOMY    . WISDOM TOOTH EXTRACTION      Current Medications: Outpatient Medications Prior to Visit  Medication Sig Dispense Refill  . amoxicillin (AMOXIL) 500 MG capsule Take 1 capsule (500 mg total) by mouth 3 (three) times daily. 30 capsule 0  . apixaban (ELIQUIS) 5 MG TABS tablet Take 1 tablet (5 mg total) by mouth 2 (two) times daily. 180 tablet 3  . atorvastatin (LIPITOR) 10 MG tablet Take 1 tablet (10 mg total) by mouth daily. For cholesterol 30 tablet 5  . B-D ULTRAFINE III SHORT PEN 31G X 8 MM MISC Use twice a daily to inject insulin 100 each 5  . B-D ULTRAFINE III SHORT PEN 31G X 8 MM MISC USE FOR INSULIN INJECTIONS TWICE DAILY 100 each 2  . benzonatate (TESSALON) 100 MG capsule Take 1 capsule (100 mg total) by mouth 3 (three) times daily as needed for cough. 24 capsule 0  . CONTOUR NEXT TEST test strip USE TO TEST BLOOD SUGAR TWICE DAILY AS DIRECTED 100 each 2  . famotidine (PEPCID) 20 MG tablet TAKE 1 TABLET(20 MG) BY MOUTH TWICE DAILY 60 tablet 0  . glipiZIDE (GLUCOTROL) 5 MG tablet TAKE ONE HALF TABLET BID 30 tablet 5  . Insulin Glargine (LANTUS SOLOSTAR) 100 UNIT/ML Solostar Pen ADMINISTER 75 UNITS UNDER THE SKIN EVERY NIGHT AT BEDTIME 15 mL 5  . metFORMIN (GLUCOPHAGE) 500 MG tablet Take 1 tablet (500 mg total) by mouth 2 (two) times daily with a meal. 180 tablet 1  . metoprolol succinate (TOPROL XL) 25 MG 24 hr tablet Take 1.5 tablets (37.5 mg total) by mouth daily. 45 tablet 11  . NORLYDA 0.35 MG tablet Take 1 tablet by mouth daily.   12  . furosemide (LASIX) 40 MG tablet Take 1 tablet (40 mg total) by mouth 2 (two) times daily. 30 tablet 6  . losartan (COZAAR) 25 MG tablet Take 1 tablet (25 mg total) by mouth daily. 90 tablet 3   No facility-administered medications prior to visit.      Allergies:   Aspartame and phenylalanine   Social History    Socioeconomic History  . Marital status: Married    Spouse name: Not on file  . Number of children: Not on file  . Years of education: Not on file  . Highest education level: Not on file  Occupational History  . Not on file  Social Needs  . Financial resource strain: Not on file  . Food insecurity:    Worry: Not on file    Inability: Not on file  . Transportation needs:    Medical: Not on file    Non-medical: Not on file  Tobacco Use  . Smoking status: Never Smoker  . Smokeless tobacco: Never Used  Substance and Sexual Activity  . Alcohol use: No  . Drug use: No  .  Sexual activity: Yes    Birth control/protection: Pill  Lifestyle  . Physical activity:    Days per week: Not on file    Minutes per session: Not on file  . Stress: Not on file  Relationships  . Social connections:    Talks on phone: Not on file    Gets together: Not on file    Attends religious service: Not on file    Active member of club or organization: Not on file    Attends meetings of clubs or organizations: Not on file    Relationship status: Not on file  Other Topics Concern  . Not on file  Social History Narrative  . Not on file     Family History:  The patient's family history includes Breast cancer in her mother and sister; Cancer in her mother; Chronic Renal Failure in her father; Diabetes in an other family member; Heart attack in an other family member; Hypertension in her mother; Stroke (age of onset: 39) in her sister.   ROS:   Please see the history of present illness.    ROS All other systems reviewed and are negative.   PHYSICAL EXAM:   VS:  BP 96/70   Pulse 91   Ht 5\' 8"  (1.727 m)   Wt 188 lb 3.2 oz (85.4 kg)   BMI 28.62 kg/m    GEN: Well nourished, well developed, in no acute distress  HEENT: normal  Neck: no JVD, carotid bruits, or masses Cardiac: RRR; no murmurs, rubs, or gallops,no edema  Respiratory:  clear to auscultation bilaterally, normal work of breathing GI:  soft, nontender, nondistended, + BS MS: no deformity or atrophy  Skin: warm and dry, no rash Neuro:  Alert and Oriented x 3, Strength and sensation are intact Psych: euthymic mood, full affect  Wt Readings from Last 3 Encounters:  04/03/18 188 lb 3.2 oz (85.4 kg)  03/27/18 186 lb 9.6 oz (84.6 kg)  03/23/18 197 lb 6.4 oz (89.5 kg)      Studies/Labs Reviewed:   EKG:  EKG is ordered today.  The ekg ordered today demonstrates normal sinus rhythm, poor R wave progression anterior leads.  Recent Labs: 03/12/2018: TSH 2.130 03/17/2018: BNP 218.4 03/23/2018: Magnesium 1.7 04/03/2018: BUN 20; Creatinine, Ser 1.11; Hemoglobin 13.8; Platelets 304; Potassium 4.1; Sodium 138   Lipid Panel    Component Value Date/Time   CHOL 202 (H) 03/26/2017 0916   TRIG 117 03/26/2017 0916   HDL 45 03/26/2017 0916   CHOLHDL 4.5 (H) 03/26/2017 0916   CHOLHDL 4.1 04/06/2014 0934   VLDL 39 04/06/2014 0934   LDLCALC 134 (H) 03/26/2017 0916    Additional studies/ records that were reviewed today include:   Echo 03/17/2018 LV EF: 20%  ------------------------------------------------------------------- Indications:      Abnormal EKG 794.31.  Dyspnea 786.09.  ------------------------------------------------------------------- History:   PMH:  Acquired from the patient and from the patient&'s chart.  Risk factors:  Hypertension. Diabetes mellitus. Dyslipidemia.  ------------------------------------------------------------------- Study Conclusions  - Left ventricle: The cavity size was normal. Systolic function was   severely reduced. The estimated ejection fraction was 20%.   Diffuse hypokinesis. Mild posterior wall thickening. The study is   not technically sufficient to allow evaluation of LV diastolic   function. - Mitral valve: Mildly thickened leaflets . - Right ventricle: The cavity size was mildly dilated. Systolic   function was mildly reduced. - Tricuspid valve: There was mild  regurgitation. - Pulmonary arteries: PA peak pressure: 47 mm Hg (  S). - Systemic veins: The IVC is dilated with normal respiratory   variation. Estimated right atrial pressure is 8 mmHg. - Pericardium, extracardiac: Small circumferential pericardial   effusion. Features were not consistent with tamponade physiology.    Cath 03/24/2018  Prox LAD lesion is 55% stenosed. DFR 0.92  Otherwise angiographically normal coronary arteries.  Hemodynamic findings consistent with severe pulmonary hypertension.  LV end diastolic pressure is severely elevated.   SUMMARY:  Moderate single-vessel disease with roughly 60% mid LAD (DFR 0.92 -not physiologically significant)  Otherwise minimal CAD.  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  (Output 4.48, Index 2.2.)  RECOMMENDATIONS:  Based on severely elevated pressures, I think is probably best served for her to be admitted for IV diuresis.  Will admit and placed on 80 mg IV twice daily Lasix.  She is on low-dose ARB and beta-blocker which can be titrated up while she is in the hospital.  Continue optimization of medical management.  We will continue lower dose Lantus (60 units as opposed to 75 units nightly) and cover with sliding scale insulin.  Continue to amoxicillin recently diagnosed, this is probably treating what was thought to be pneumonia but was probably more CHF.  Defer to primary team for canceling.   ASSESSMENT:    1. Chronic systolic heart failure (HCC)   2. Cardiomyopathy, unspecified type ([place])   3. Essential hypertension   4. Controlled type 2 diabetes mellitus without complication, with long-term current use of insulin ([place])   5. Coronary artery disease involving native coronary artery of native heart without angina pectoris      PLAN:  In  order of problems listed above:  1. Non-compaction cardiomyopathy: Dr. Oval Linsey has discussed with the patient echocardiogram for first-degree relative and the genetic screen.  She was started on systemic anticoagulation therapy given EF less than 40%.  We will switch her current losartan to [facility] with addition of low-dose Corlanor.  May consider increasing Corlanor on follow-up.  I did to decrease her Lasix from 40 mg twice daily to 40 mg daily as Entresto has very mild diuretic effect.  She is aware that she will need to contact us if her weight increased by more than 3 pounds overnight or 5 pounds in single week.   -I think switching her to [facility] likely offer her the best chance of avoiding a ICD.  She is currently on LifeVest.  I do not think she is able to tolerate the spironolactone  -Obtain repeat echocardiogram in 3 months, if EF remains less than 35%, will need to consider ICD.  2. CAD: She had 50% disease in the LAD territory on recent cardiac catheterization, no other severe blockage to explain her low EF.   3. Hypertension: Blood pressure borderline on the current medication.  Will need to monitor blood pressure closely after switching to [facility].  Ideally will need to keep systolic blood pressure greater than 90 mmHg.  She is asymptomatic   4. DM2: Managed by primary care provider.  On insulin    Medication Adjustments/Labs and Tests Ordered: Current medicines are reviewed at length with the patient today.  Concerns regarding medicines are outlined above.  Medication changes, Labs and Tests ordered today are listed in the Patient Instructions below. Patient Instructions  Medication Instructions:  Stop Losartan 25 mg. START Entresto 24/26 twice daily. START Corlanor 2.5 mg twice daily. (  cut 5 mg in half) DECREASE Lasix to 40 mg daily. If you need a refill on your cardiac medications before your next appointment, please call your pharmacy.   Lab work: CBC, BMET today BMET  in one week at [facility] in [place].  If you have labs (blood work) drawn today and your tests are completely normal, you will receive your results only by: Marland Kitchen MyChart Message (if you have MyChart) OR . A paper copy in the mail If you have any lab test that is abnormal or we need to change your treatment, we will call you to review the results.  Testing/Procedures: Echocardiogram (3 months) - Your physician has requested that you have an echocardiogram. Echocardiography is a painless test that uses sound waves to create images of your heart. It provides your doctor with information about the size and shape of your heart and how well your heart's chambers and valves are working. This procedure takes approximately one hour. There are no restrictions for this procedure. This will be performed at our [facility] location - [address], [address].   Follow-Up: At [facility], you and your health needs are our priority.  As part of our continuing mission to provide you with exceptional heart care, we have created designated Provider Care Teams.  These Care Teams include your primary Cardiologist (physician) and Advanced Practice Providers (APPs -  Physician Assistants and Nurse Practitioners) who all work together to provide you with the care you need, when you need it. Marland Kitchen Keep Follow up appointment in [place] as scheduled.  Any Other Special Instructions Will Be Listed Below (If Applicable). Continue daily weights and BP diary. Please call if systolic number drops below 90.    Heart Failure Prevention plan: 1. Limit salt intake 2. Keep daily fluid intake to between 32-64 oz 3. Weigh yourself every morning, call us if weight increase by more than 3 lbs overnight or 5 lbs in a single week     Hilbert Corrigan, [place]  04/05/2018 11:43 PM    [address], [place], [place]  [address] Phone: [phone]; Fax: [phone]

## 2018-04-04 LAB — CBC
Hematocrit: 41.5 % (ref 34.0–46.6)
Hemoglobin: 13.8 g/dL (ref 11.1–15.9)
MCH: 29.6 pg (ref 26.6–33.0)
MCHC: 33.3 g/dL (ref 31.5–35.7)
MCV: 89 fL (ref 79–97)
Platelets: 304 10*3/uL (ref 150–450)
RBC: 4.66 x10E6/uL (ref 3.77–5.28)
RDW: 13 % (ref 12.3–15.4)
WBC: 9.5 10*3/uL (ref 3.4–10.8)

## 2018-04-04 LAB — BASIC METABOLIC PANEL
BUN/Creatinine Ratio: 18 (ref 9–23)
BUN: 20 mg/dL (ref 6–24)
CO2: 24 mmol/L (ref 20–29)
Calcium: 10.2 mg/dL (ref 8.7–10.2)
Chloride: 97 mmol/L (ref 96–106)
Creatinine, Ser: 1.11 mg/dL — ABNORMAL HIGH (ref 0.57–1.00)
GFR calc Af Amer: 68 mL/min/{1.73_m2} (ref >59)
GFR calc non Af Amer: 59 mL/min/{1.73_m2} — ABNORMAL LOW (ref >59)
Glucose: 87 mg/dL (ref 65–99)
Potassium: 4.1 mmol/L (ref 3.5–5.2)
Sodium: 138 mmol/L (ref 134–144)

## 2018-04-05 ENCOUNTER — Encounter: Payer: Self-pay | Admitting: Physician Assistant

## 2018-04-06 NOTE — Progress Notes (Signed)
Stable red blood cell count, renal function slightly worsened on the previous dose of lasix, I have since reduced the dose of lasix during the recent office visit. Plan to repeat BMET in 1 week in [place] is unchanged.

## 2018-04-07 ENCOUNTER — Telehealth: Payer: Self-pay | Admitting: Physician Assistant

## 2018-04-07 ENCOUNTER — Telehealth: Payer: Self-pay | Admitting: Family Medicine

## 2018-04-07 ENCOUNTER — Other Ambulatory Visit: Payer: Self-pay

## 2018-04-07 DIAGNOSIS — I429 Cardiomyopathy, unspecified: Secondary | ICD-10-CM

## 2018-04-07 MED ORDER — IVABRADINE HCL 5 MG PO TABS: 2.5000 mg | ORAL_TABLET | Freq: Two times a day (BID) | ORAL | 3 refills | Status: DC

## 2018-04-07 MED ORDER — GLUCOSE BLOOD VI STRP: ORAL_STRIP | 3 refills | Status: DC

## 2018-04-07 NOTE — Telephone Encounter (Signed)
Returned call to patient recent lab results given.She will have repeat bmet done 04/13/18 in [place].Stated she has not started taking Corlanor.Stated she went to pick up and it was not ready.She will pick up today and start taking.

## 2018-04-07 NOTE — Telephone Encounter (Signed)
Patient returned call for lab results.  

## 2018-04-07 NOTE — Telephone Encounter (Signed)
Pt returned call and verbalized understanding  

## 2018-04-07 NOTE — Telephone Encounter (Signed)
Needs rx sent in for Contour Diabetic test strips to [facility] on [address].

## 2018-04-07 NOTE — Telephone Encounter (Signed)
Patient said to call her on cell phone [phone]

## 2018-04-07 NOTE — Telephone Encounter (Signed)
Prescription sent electronically to pharmacy. Left message to return call to notify patient. 

## 2018-04-08 DIAGNOSIS — Z9889 Other specified postprocedural states: Secondary | ICD-10-CM

## 2018-04-08 HISTORY — DX: Other specified postprocedural states: Z98.890

## 2018-04-10 ENCOUNTER — Telehealth: Payer: Self-pay | Admitting: Cardiology

## 2018-04-10 NOTE — Telephone Encounter (Signed)
Patient states she intends to come for f/u on 04/16/18. I explained that her insurance denied coverage for Corlanor and I have messaged Almyra Deforest to see if he wants another medication.

## 2018-04-10 NOTE — Telephone Encounter (Signed)
New message     Pt needs prior auth for ivabradine (CORLANOR) 5 MG TABS tablet . Pt has an appt on 1/9 and wants to know if she should still keep appt and get  labs even though she has not been taking medication

## 2018-04-13 ENCOUNTER — Other Ambulatory Visit (HOSPITAL_COMMUNITY)
Admission: RE | Admit: 2018-04-13 | Discharge: 2018-04-13 | Disposition: A | Discharge status: Home / Self Care | Payer: PRIVATE HEALTH INSURANCE | Source: Ambulatory Visit | Attending: Physician Assistant | Admitting: Physician Assistant

## 2018-04-13 DIAGNOSIS — I429 Cardiomyopathy, unspecified: Secondary | ICD-10-CM | POA: Diagnosis present

## 2018-04-13 DIAGNOSIS — I5021 Acute systolic (congestive) heart failure: Secondary | ICD-10-CM | POA: Insufficient documentation

## 2018-04-13 LAB — BASIC METABOLIC PANEL
Anion gap: 9 (ref 5–15)
BUN: 15 mg/dL (ref 6–20)
CO2: 26 mmol/L (ref 22–32)
Calcium: 8.9 mg/dL (ref 8.9–10.3)
Chloride: 102 mmol/L (ref 98–111)
Creatinine, Ser: 0.81 mg/dL (ref 0.44–1.00)
GFR calc Af Amer: >60 mL/min (ref >60)
GFR calc non Af Amer: >60 mL/min (ref >60)
Glucose, Bld: 179 mg/dL — ABNORMAL HIGH (ref 70–99)
Potassium: 3.8 mmol/L (ref 3.5–5.1)
Sodium: 137 mmol/L (ref 135–145)

## 2018-04-14 ENCOUNTER — Telehealth: Payer: Self-pay | Admitting: Physician Assistant

## 2018-04-14 ENCOUNTER — Telehealth: Payer: Self-pay

## 2018-04-14 NOTE — Telephone Encounter (Signed)
New message   Pt is returning phone call for lab results

## 2018-04-14 NOTE — Telephone Encounter (Signed)
I notified patient that she did not need to take Corlanor right now per Almyra Deforest.She has f/u later this week with B.Strader PA-C

## 2018-04-14 NOTE — Telephone Encounter (Signed)
-----   Message from [place], [place] sent at 04/14/2018 10:26 AM EST ----- Regarding: RE: corlanor rejected by pt's insurnace No corlanor for now.  Thanks Isaac Laud ----- Message ----- From: Bernita Raisin, RN Sent: 04/10/2018   4:33 PM EST To: Almyra Deforest, PA Subject: corlanor rejected by pt's insurnace            Hi Ernesta Amble, Corlanor was rejected by pt's insurance as non-preferred.  Do you want to order something else or have [place] evaluate her at her f/u apt ?  Thanks,    [employer]

## 2018-04-14 NOTE — Telephone Encounter (Signed)
Pt aware of lab results with verbal understanding.  Notes recorded by Almyra Deforest, PA on 04/14/2018 at 10:01 AM EST Normal renal function and electrolyte. Blood glucose was high, however I told the patient I did not need a fasting lab.

## 2018-04-16 ENCOUNTER — Encounter: Payer: Self-pay | Admitting: Student

## 2018-04-16 ENCOUNTER — Ambulatory Visit (INDEPENDENT_AMBULATORY_CARE_PROVIDER_SITE_OTHER): Payer: PRIVATE HEALTH INSURANCE | Admitting: Student

## 2018-04-16 VITALS — BP 94/60 | HR 90 | Ht 68.0 in | Wt 194.0 lb

## 2018-04-16 DIAGNOSIS — E119 Type 2 diabetes mellitus without complications: Secondary | ICD-10-CM

## 2018-04-16 DIAGNOSIS — I272 Pulmonary hypertension, unspecified: Secondary | ICD-10-CM | POA: Diagnosis not present

## 2018-04-16 DIAGNOSIS — I5042 Chronic combined systolic (congestive) and diastolic (congestive) heart failure: Secondary | ICD-10-CM | POA: Diagnosis not present

## 2018-04-16 DIAGNOSIS — Z794 Long term (current) use of insulin: Secondary | ICD-10-CM

## 2018-04-16 DIAGNOSIS — I428 Other cardiomyopathies: Secondary | ICD-10-CM | POA: Diagnosis not present

## 2018-04-16 DIAGNOSIS — I2510 Atherosclerotic heart disease of native coronary artery without angina pectoris: Secondary | ICD-10-CM | POA: Diagnosis not present

## 2018-04-16 DIAGNOSIS — I10 Essential (primary) hypertension: Secondary | ICD-10-CM

## 2018-04-16 NOTE — Patient Instructions (Addendum)
Medication Instructions:  Your physician recommends that you continue on your current medications as directed. Please refer to the Current Medication list given to you today.   Labwork: none  Testing/Procedures: none  Follow-Up: Your physician recommends that you schedule a follow-up appointment in: 4-6 weeks    Any Other Special Instructions Will Be Listed Below (If Applicable).     If you need a refill on your cardiac medications before your next appointment, please call your pharmacy.    Limit daily fluid intake to less than 2 Liters per day. Please limit salt intake.  Please weight yourself every morning. Take an extra Lasix 40mg  if weight increases by 3 pounds overnight or 5 pounds in a single week.    Two Gram Sodium Diet 2000 mg  What is Sodium? Sodium is a mineral found naturally in many foods. The most significant source of sodium in the diet is table salt, which is about 40% sodium.  Processed, convenience, and preserved foods also contain a large amount of sodium.  The body needs only 500 mg of sodium daily to function,  A normal diet provides more than enough sodium even if you do not use salt.  Why Limit Sodium? A build up of sodium in the body can cause thirst, increased blood pressure, shortness of breath, and water retention.  Decreasing sodium in the diet can reduce edema and risk of heart attack or stroke associated with high blood pressure.  Keep in mind that there are many other factors involved in these health problems.  Heredity, obesity, lack of exercise, cigarette smoking, stress and what you eat all play a role.  General Guidelines:  Do not add salt at the table or in cooking.  One teaspoon of salt contains over 2 grams of sodium.  Read food labels  Avoid processed and convenience foods  Ask your dietitian before eating any foods not dicussed in the menu planning guidelines  Consult your physician if you wish to use a salt substitute or a sodium  containing medication such as antacids.  Limit milk and milk products to 16 oz (2 cups) per day.  Shopping Hints:  READ LABELS!! "Dietetic" does not necessarily mean low sodium.  Salt and other sodium ingredients are often added to foods during processing.   Menu Planning Guidelines Food Group Choose More Often Avoid  Beverages (see also the milk group All fruit juices, low-sodium, salt-free vegetables juices, low-sodium carbonated beverages Regular vegetable or tomato juices, commercially softened water used for drinking or cooking  Breads and Cereals Enriched white, wheat, rye and pumpernickel bread, hard rolls and dinner rolls; muffins, cornbread and waffles; most dry cereals, cooked cereal without added salt; unsalted crackers and breadsticks; low sodium or homemade bread crumbs Bread, rolls and crackers with salted tops; quick breads; instant hot cereals; pancakes; commercial bread stuffing; self-rising flower and biscuit mixes; regular bread crumbs or cracker crumbs  Desserts and Sweets Desserts and sweets mad with mild should be within allowance Instant pudding mixes and cake mixes  Fats Butter or margarine; vegetable oils; unsalted salad dressings, regular salad dressings limited to 1 Tbs; light, sour and heavy cream Regular salad dressings containing bacon fat, bacon bits, and salt pork; snack dips made with instant soup mixes or processed cheese; salted nuts  Fruits Most fresh, frozen and canned fruits Fruits processed with salt or sodium-containing ingredient (some dried fruits are processed with sodium sulfites        Vegetables Fresh, frozen vegetables and low- sodium canned  vegetables Regular canned vegetables, sauerkraut, pickled vegetables, and others prepared in brine; frozen vegetables in sauces; vegetables seasoned with ham, bacon or salt pork  Condiments, Sauces, Miscellaneous  Salt substitute with physician's approval; pepper, herbs, spices; vinegar, lemon or lime juice;  hot pepper sauce; garlic powder, onion powder, low sodium soy sauce (1 Tbs.); low sodium condiments (ketchup, chili sauce, mustard) in limited amounts (1 tsp.) fresh ground horseradish; unsalted tortilla chips, pretzels, potato chips, popcorn, salsa (1/4 cup) Any seasoning made with salt including garlic salt, celery salt, onion salt, and seasoned salt; sea salt, rock salt, kosher salt; meat tenderizers; monosodium glutamate; mustard, regular soy sauce, barbecue, sauce, chili sauce, teriyaki sauce, steak sauce, Worcestershire sauce, and most flavored vinegars; canned gravy and mixes; regular condiments; salted snack foods, olives, picles, relish, horseradish sauce, catsup   Food preparation: Try these seasonings Meats:    Pork Sage, onion Serve with applesauce  Chicken Poultry seasoning, thyme, parsley Serve with cranberry sauce  Lamb Curry powder, rosemary, garlic, thyme Serve with mint sauce or jelly  Veal Marjoram, basil Serve with current jelly, cranberry sauce  Beef Pepper, bay leaf Serve with dry mustard, unsalted chive butter  Fish [place] leaf, dill Serve with unsalted lemon butter, unsalted parsley butter  Vegetables:    Asparagus Lemon juice   Broccoli Lemon juice   Carrots Mustard dressing parsley, mint, nutmeg, glazed with unsalted butter and sugar   Green beans Marjoram, lemon juice, nutmeg,dill seed   Tomatoes Basil, marjoram, onion   Spice /blend for [employer]" 4 tsp ground thyme 1 tsp ground sage 3 tsp ground rosemary 4 tsp ground marjoram   Test your knowledge 1. A product that says "Salt Free" may still contain sodium. True or False 2. Garlic Powder and Hot Pepper Sauce an be used as alternative seasonings.True or False 3. Processed foods have more sodium than fresh foods.  True or False 4. Canned Vegetables have less sodium than froze True or False  WAYS TO DECREASE YOUR SODIUM INTAKE 1. Avoid the use of added salt in cooking and at the table.  Table salt (and other  prepared seasonings which contain salt) is probably one of the greatest sources of sodium in the diet.  Unsalted foods can gain flavor from the sweet, sour, and butter taste sensations of herbs and spices.  Instead of using salt for seasoning, try the following seasonings with the foods listed.  Remember: how you use them to enhance natural food flavors is limited only by your creativity... Allspice-Meat, fish, eggs, fruit, peas, red and yellow vegetables Almond Extract-Fruit baked goods Anise Seed-Sweet breads, fruit, carrots, beets, cottage cheese, cookies (tastes like licorice) Basil-Meat, fish, eggs, vegetables, rice, vegetables salads, soups, sauces Bay Leaf-Meat, fish, stews, poultry Burnet-Salad, vegetables (cucumber-like flavor) Caraway Seed-Bread, cookies, cottage cheese, meat, vegetables, cheese, rice Cardamon-Baked goods, fruit, soups Celery Powder or seed-Salads, salad dressings, sauces, meatloaf, soup, bread.Do not use  celery salt Chervil-Meats, salads, fish, eggs, vegetables, cottage cheese (parsley-like flavor) Chili Power-Meatloaf, chicken cheese, corn, eggplant, egg dishes Chives-Salads cottage cheese, egg dishes, soups, vegetables, sauces Cilantro-Salsa, casseroles Cinnamon-Baked goods, fruit, pork, lamb, chicken, carrots Cloves-Fruit, baked goods, fish, pot roast, green beans, beets, carrots Coriander-Pastry, cookies, meat, salads, cheese (lemon-orange flavor) Cumin-Meatloaf, fish,cheese, eggs, cabbage,fruit pie (caraway flavor) [employer], fruit, eggs, fish, poultry, cottage cheese, vegetables Dill Seed-Meat, cottage cheese, poultry, vegetables, fish, salads, bread Fennel Seed-Bread, cookies, apples, pork, eggs, fish, beets, cabbage, cheese, Licorice-like flavor Garlic-(buds or powder) Salads, meat, poultry, fish, bread, butter, vegetables, potatoes.Do  not  use garlic salt Ginger-Fruit, vegetables, baked goods, meat, fish, poultry Horseradish Root-Meet, vegetables,  butter Lemon Juice or Extract-Vegetables, fruit, tea, baked goods, fish salads Mace-Baked goods fruit, vegetables, fish, poultry (taste like nutmeg) Maple Extract-Syrups Marjoram-Meat, chicken, fish, vegetables, breads, green salads (taste like Sage) Mint-Tea, lamb, sherbet, vegetables, desserts, carrots, cabbage Mustard, Dry or Seed-Cheese, eggs, meats, vegetables, poultry Nutmeg-Baked goods, fruit, chicken, eggs, vegetables, desserts Onion Powder-Meat, fish, poultry, vegetables, cheese, eggs, bread, rice salads (Do not use   Onion salt) Orange Extract-Desserts, baked goods Oregano-Pasta, eggs, cheese, onions, pork, lamb, fish, chicken, vegetables, green salads Paprika-Meat, fish, poultry, eggs, cheese, vegetables Parsley Flakes-Butter, vegetables, meat fish, poultry, eggs, bread, salads (certain forms may   Contain sodium Pepper-Meat fish, poultry, vegetables, eggs Peppermint Extract-Desserts, baked goods Poppy Seed-Eggs, bread, cheese, fruit dressings, baked goods, noodles, vegetables, cottage  [employer], poultry, meat, fish, cauliflower, turnips,eggs bread Saffron-Rice, bread, veal, chicken, fish, eggs Sage-Meat, fish, poultry, onions, eggplant, tomateos, pork, stews Savory-Eggs, salads, poultry, meat, rice, vegetables, soups, pork Tarragon-Meat, poultry, fish, eggs, butter, vegetables (licorice-like flavor)  Thyme-Meat, poultry, fish, eggs, vegetables, (clover-like flavor), sauces, soups Tumeric-Salads, butter, eggs, fish, rice, vegetables (saffron-like flavor) Vanilla Extract-Baked goods, candy Vinegar-Salads, vegetables, meat marinades Walnut Extract-baked goods, candy  2. Choose your Foods Wisely   The following is a list of foods to avoid which are high in sodium:  Meats-Avoid all smoked, canned, salt cured, dried and kosher meat and fish as well as Anchovies   [employer] meats:Bologna, Liverwurst, Pastrami Canned meat  or fish  Marinated herring Caviar    Pepperoni Corned Beef   Pizza Dried chipped beef  Salami Frozen breaded fish or meat Salt pork Frankfurters or hot dogs  Sardines Gefilte fish   Sausage Ham (boiled ham, Proscuitto Smoked butt    spiced ham)   Spam      TV Dinners Vegetables Canned vegetables (Regular) Relish Canned mushrooms  Sauerkraut Olives    Tomato juice Pickles  Bakery and Dessert Products Canned puddings  Cream pies Cheesecake   Decorated cakes Cookies  Beverages/Juices Tomato juice, regular  Gatorade   V-8 vegetable juice, regular  Breads and Cereals Biscuit mixes   Salted potato chips, corn chips, pretzels Bread stuffing mixes  Salted crackers and rolls Pancake and waffle mixes Self-rising flour  Seasonings Accent    Meat sauces Barbecue sauce  Meat tenderizer Catsup    Monosodium glutamate (MSG) Celery salt   Onion salt Chili sauce   Prepared mustard Garlic salt   Salt, seasoned salt, sea salt Gravy mixes   Soy sauce Horseradish   Steak sauce Ketchup   Tartar sauce Lite salt    Teriyaki sauce Marinade mixes   Worcestershire sauce  Others Baking powder   Cocoa and cocoa mixes Baking soda   Commercial casserole mixes Candy-caramels, chocolate  Dehydrated soups    Bars, fudge,nougats  Instant rice and pasta mixes Canned broth or soup  Maraschino cherries Cheese, aged and processed cheese and cheese spreads  Learning Assessment Quiz  Indicated T (for True) or F (for False) for each of the following statements:  1. _____ Fresh fruits and vegetables and unprocessed grains are generally low in sodium 2. _____ Water may contain a considerable amount of sodium, depending on the source 3. _____ You can always tell if a food is high in sodium by tasting it 4. _____ Certain laxatives my be high in sodium and should be avoided unless prescribed   by  a physician or pharmacist 5. _____ Salt substitutes may be used freely by anyone on a sodium restricted  diet 6. _____ Sodium is present in table salt, food additives and as a natural component of   most foods 7. _____ Table salt is approximately 90% sodium 8. _____ Limiting sodium intake may help prevent excess fluid accumulation in the body 9. _____ On a sodium-restricted diet, seasonings such as bouillon soy sauce, and    cooking wine should be used in place of table salt 10. _____ On an ingredient list, a product which lists monosodium glutamate as the first   ingredient is an appropriate food to include on a low sodium diet  Circle the best answer(s) to the following statements (Hint: there may be more than one correct answer)  11. On a low-sodium diet, some acceptable snack items are:    A. Olives  F. Bean dip   K. Grapefruit juice    B. Salted Pretzels G. Commercial Popcorn   L. Canned peaches    C. Carrot Sticks  H. Bouillon   M. Unsalted nuts   D. [place] fries  I. Peanut butter crackers N. Salami   E. Sweet pickles J. Tomato Juice   O. Pizza  12.  Seasonings that may be used freely on a reduced - sodium diet include   A. Lemon wedges F.Monosodium glutamate K. Celery seed    B.Soysauce   G. Pepper   L. Mustard powder   C. Sea salt  H. Cooking wine  M. Onion flakes   D. Vinegar  E. Prepared horseradish N. Salsa   E. Sage   J. Worcestershire sauce  O. Chutney

## 2018-04-16 NOTE — Progress Notes (Signed)
Cardiology Office Note    Date:  04/16/2018   ID:  Tara Bryant, DOB 16-Jun-1970, MRN [id number]  PCP:  Mikey Kirschner, MD  Cardiologist: Carlyle Dolly, MD    Chief Complaint  Patient presents with  . Follow-up    3 week visit    History of Present Illness:    Tara Bryant is a 48 y.o. female with past medical history of chronic systolic and diastolic CHF (EF 56% by echocardiogram in 03/2018), CAD (s/p cath in 03/2018 showing 60% mid-LAD stenosis but not significant by FFR), pulmonary HTN, HLD, HTN, and Type 2 DM who presents to the office today for 3-week follow-up.  She was admitted for a cardiac catheterization in 03/2018 after being found to have an EF of 20% by outpatient echocardiogram.  Was admitted for IV diuresis following her catheterization due to elevated LVEDP by cath. 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 It was recommended that she be evaluated by genetic counselor as an outpatient and to have first-degree family members screened with an echocardiogram. She was also started on Eliquis 5 mg twice daily by Dr. Oval Linsey.  A LifeVest was arranged prior to discharge with a repeat echocardiogram recommended within 3 months. Discharge medications included Eliquis 5 mg twice daily, Atorvastatin 10 mg daily, Lasix 40 mg BID, Losartan 25 mg daily, and Toprol-XL 37.5 mg daily  She did follow-up with Almyra Deforest, PA-C on 04/03/2018 and reported improvement in her respiratory status. Weight was stable at 188 lbs and BP was soft at 96/70. She was continued on Toprol-XL and Losartan was transitioned to [facility] 24-26mg  BID. Corlanor was also added at 2.5mg  daily and Lasix was reduced from 40mg  BID to 40mg  daily.  Repeat labs were obtained on 04/13/2018 and showed that creatinine had improved to 0.81 and electrolytes remained stable.   In talking  with the patient today, she reports her symptoms have overall been stable since her most recent office visit. She has been following weight on her home scales and says this has increased from 181 lbs at the time of hospital discharge in 03/2018 to 187 lbs today. In reviewing her weight log, this has been a gradual increase and weight was 187 lbs last week and had overall been stable at 184 - 187 lbs on her scales this week. Weight is listed as 194 lbs on the office scales but this was obtained with her wearing large boots and her LifeVest.   She denies any specific dyspnea on exertion, orthopnea, PND, or lower extremity edema.  No recent chest pain or palpitations.  She reports good compliance with her current medication regimen and SBP has typically been ranging in the 90's to low 100's. She has had 2 readings over the past 3 weeks with SBP in the 80's but was overall asymptomatic with this. BP is soft at 94/60 today and she denies any associated lightheadedness, dizziness, or presyncope.   Past Medical History:  Diagnosis Date  . Arrhythmia   . CAD (coronary artery disease)    a. 03/2018: cath showing 60% mid-LAD stenosis not significant by FFR.   Marland Kitchen Chronic combined systolic (congestive) and diastolic (congestive) heart failure ([place])    a. 03/2018: found to have a newly reduced EF of 20% by echocardiogram  . Depression   . Diabetes mellitus   . Hypertension   . Microproteinuria     Past Surgical  History:  Procedure Laterality Date  . BACK SURGERY    . BREAST BIOPSY Left 2017   benign  . INTRAVASCULAR PRESSURE WIRE/FFR STUDY N/A 03/24/2018   Procedure: INTRAVASCULAR PRESSURE WIRE/FFR STUDY;  Surgeon: Leonie Man, MD;  Location: [facility];  Service: Cardiovascular;  Laterality: N/A;  . NOSE SURGERY    . RIGHT/LEFT HEART CATH AND CORONARY ANGIOGRAPHY N/A 03/24/2018   Procedure: RIGHT/LEFT HEART CATH AND CORONARY ANGIOGRAPHY;  Surgeon: Leonie Man, MD;  Location: [facility];  Service: Cardiovascular;  Laterality: N/A;  . TONSILLECTOMY    . WISDOM TOOTH EXTRACTION      Current Medications: Outpatient Medications Prior to Visit  Medication Sig Dispense Refill  . apixaban (ELIQUIS) 5 MG TABS tablet Take 1 tablet (5 mg total) by mouth 2 (two) times daily. 180 tablet 3  . atorvastatin (LIPITOR) 10 MG tablet Take 1 tablet (10 mg total) by mouth daily. For cholesterol 30 tablet 5  . B-D ULTRAFINE III SHORT PEN 31G X 8 MM MISC Use twice a daily to inject insulin 100 each 5  . B-D ULTRAFINE III SHORT PEN 31G X 8 MM MISC USE FOR INSULIN INJECTIONS TWICE DAILY 100 each 2  . benzonatate (TESSALON) 100 MG capsule Take 1 capsule (100 mg total) by mouth 3 (three) times daily as needed for cough. 24 capsule 0  . famotidine (PEPCID) 20 MG tablet TAKE 1 TABLET(20 MG) BY MOUTH TWICE DAILY 60 tablet 0  . furosemide (LASIX) 40 MG tablet Take 1 tablet (40 mg total) by mouth daily. 30 tablet 6  . glipiZIDE (GLUCOTROL) 5 MG tablet TAKE ONE HALF TABLET BID 30 tablet 5  . glucose blood (CONTOUR NEXT TEST) test strip USE TO TEST BLOOD SUGAR TWICE DAILY AS DIRECTED 100 each 3  . Insulin Glargine (LANTUS SOLOSTAR) 100 UNIT/ML Solostar Pen ADMINISTER 75 UNITS UNDER THE SKIN EVERY NIGHT AT BEDTIME 15 mL 5  . metFORMIN (GLUCOPHAGE) 500 MG tablet Take 1 tablet (500 mg total) by mouth 2 (two) times daily with a meal. 180 tablet 1  . metoprolol succinate (TOPROL XL) 25 MG 24 hr tablet Take 1.5 tablets (37.5 mg total) by mouth daily. 45 tablet 11  . NORLYDA 0.35 MG tablet Take 1 tablet by mouth daily.   12  . sacubitril-valsartan (ENTRESTO) 24-26 MG Take 1 tablet by mouth twice daily. Please Honor Card patient is presenting for Carmie Kanner: [id number]; Juanna Cao: [phone]; HFWYO: [id number]; ISSUER: [id number] ID: Pharmacy to complete 60 tablet 3  . amoxicillin (AMOXIL) 500 MG capsule Take 1 capsule (500 mg total) by mouth 3 (three) times daily. 30 capsule 0   No facility-administered medications  prior to visit.      Allergies:   Aspartame and phenylalanine   Social History   Socioeconomic History  . Marital status: Married    Spouse name: Not on file  . Number of children: Not on file  . Years of education: Not on file  . Highest education level: Not on file  Occupational History  . Not on file  Social Needs  . Financial resource strain: Not on file  . Food insecurity:    Worry: Not on file    Inability: Not on file  . Transportation needs:    Medical: Not on file    Non-medical: Not on file  Tobacco Use  . Smoking status: Never Smoker  . Smokeless tobacco: Never Used  Substance and Sexual Activity  . Alcohol use: No  .  Drug use: No  . Sexual activity: Yes    Birth control/protection: Pill  Lifestyle  . Physical activity:    Days per week: Not on file    Minutes per session: Not on file  . Stress: Not on file  Relationships  . Social connections:    Talks on phone: Not on file    Gets together: Not on file    Attends religious service: Not on file    Active member of club or organization: Not on file    Attends meetings of clubs or organizations: Not on file    Relationship status: Not on file  Other Topics Concern  . Not on file  Social History Narrative  . Not on file     Family History:  The patient's family history includes Breast cancer in her mother and sister; Cancer in her mother; Chronic Renal Failure in her father; Diabetes in an other family member; Heart attack in an other family member; Hypertension in her mother; Stroke (age of onset: 46) in her sister.   Review of Systems:   Please see the history of present illness.     General:  No chills, fever, night sweats or weight changes.  Cardiovascular:  No chest pain, edema, orthopnea, palpitations, paroxysmal nocturnal dyspnea. Positive for dyspnea on exertion (improving).  Dermatological: No rash, lesions/masses Respiratory: No cough, dyspnea Urologic: No hematuria, dysuria Abdominal:    No nausea, vomiting, diarrhea, bright red blood per rectum, melena, or hematemesis Neurologic:  No visual changes, wkns, changes in mental status.  All other systems reviewed and are otherwise negative except as noted above.   Physical Exam:    VS:  BP 94/60 (BP Location: Left Arm)   Pulse 90   Ht 5\' 8"  (1.727 m)   Wt 194 lb (88 kg)   SpO2 99%   BMI 29.50 kg/m    General: Well developed, well nourished Caucasian female appearing in no acute distress. Head: Normocephalic, atraumatic, sclera non-icteric, no xanthomas, nares are without discharge.  Neck: No carotid bruits. JVD not elevated.  Lungs: Respirations regular and unlabored, without wheezes or rales.  Heart: Regular rate and rhythm. No S3 or S4.  No murmur, no rubs, or gallops appreciated. Abdomen: Soft, non-tender, non-distended with normoactive bowel sounds. No hepatomegaly. No rebound/guarding. No obvious abdominal masses. Msk:  Strength and tone appear normal for age. No joint deformities or effusions. Extremities: No clubbing or cyanosis. No lower extremity edema.  Distal pedal pulses are 2+ bilaterally. Neuro: Alert and oriented X 3. Moves all extremities spontaneously. No focal deficits noted. Psych:  Responds to questions appropriately with a normal affect. Skin: No rashes or lesions noted  Wt Readings from Last 3 Encounters:  04/16/18 194 lb (88 kg)  04/03/18 188 lb 3.2 oz (85.4 kg)  03/27/18 186 lb 9.6 oz (84.6 kg)     Studies/Labs Reviewed:   EKG:  EKG is not ordered today.    Recent Labs: 03/12/2018: TSH 2.130 03/17/2018: BNP 218.4 03/23/2018: Magnesium 1.7 04/03/2018: Hemoglobin 13.8; Platelets 304 04/13/2018: BUN 15; Creatinine, Ser 0.81; Potassium 3.8; Sodium 137   Lipid Panel    Component Value Date/Time   CHOL 202 (H) 03/26/2017 0916   TRIG 117 03/26/2017 0916   HDL 45 03/26/2017 0916   CHOLHDL 4.5 (H) 03/26/2017 0916   CHOLHDL 4.1 04/06/2014 0934   VLDL 39 04/06/2014 0934   LDLCALC 134 (H)  03/26/2017 0916    Additional studies/ records that were reviewed today include:  Echocardiogram: 03/17/2018 Study Conclusions  - Left ventricle: The cavity size was normal. Systolic function was   severely reduced. The estimated ejection fraction was 20%.   Diffuse hypokinesis. Mild posterior wall thickening. The study is   not technically sufficient to allow evaluation of LV diastolic   function. - Mitral valve: Mildly thickened leaflets . - Right ventricle: The cavity size was mildly dilated. Systolic   function was mildly reduced. - Tricuspid valve: There was mild regurgitation. - Pulmonary arteries: PA peak pressure: 47 mm Hg (S). - Systemic veins: The IVC is dilated with normal respiratory   variation. Estimated right atrial pressure is 8 mmHg. - Pericardium, extracardiac: Small circumferential pericardial   effusion. Features were not consistent with tamponade physiology.  Cardiac Catheterization: 03/24/2018  Prox LAD lesion is 55% stenosed. DFR 0.92  Otherwise angiographically normal coronary arteries.  Hemodynamic findings consistent with severe pulmonary hypertension.  LV end diastolic pressure is severely elevated.   SUMMARY:  Moderate single-vessel disease with roughly 60% mid LAD (DFR 0.92 -not physiologically significant)  Otherwise minimal CAD.  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  (Output 4.48, Index 2.2.)  RECOMMENDATIONS:  Based on severely elevated pressures, I think is probably best served for her to be admitted for IV diuresis.  Will admit and placed on 80 mg IV twice daily Lasix.  She is on low-dose ARB and beta-blocker which can be titrated up while she is in the hospital.  Continue optimization of medical management.  We will continue lower dose Lantus (60 units  as opposed to 75 units nightly) and cover with sliding scale insulin.  Continue to amoxicillin recently diagnosed, this is probably treating what was thought to be pneumonia but was probably more CHF.  Defer to primary team for canceling.  Cardiac MRI: 03/26/2018 IMPRESSION: 1. Moderately dilated left ventricle with mild concentric hypertrophy and severe systolic function (LVEF = 27%). Severe diffuse hypokinesis and paradoxical septal motion. There is no late gadolinium enhancement in the left ventricular myocardium. There is significant hyper trabeculation of the left ventricle with ratio of non-compacted to compacted myocardium > 4:1.  2. Normal right ventricular size, thickness and moderate systolic dysfunction (LVEF = 30%). There are no regional wall motion abnormalities.  3.  Normal left and right atrial size.  4. Normal size of the aortic root, ascending aorta and pulmonary artery.  5.  Trivial mitral and mild tricuspid regurgitation.  6. Normal pericardium. Mild circumferential pericardial effusion, moderate amount inferior to the heart (up to 11 mm).  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  Assessment:    1. Chronic combined systolic and diastolic heart failure ([place])   2. Nonischemic cardiomyopathy ([place])   3. Pulmonary hypertension, unspecified ([place])   4. Coronary artery disease involving native coronary artery of native heart without angina pectoris   5. Essential hypertension   6. Controlled type 2 diabetes mellitus without complication, with long-term current use of insulin ([place])      Plan:   In order of problems listed above:  1. Chronic Combined Systolic and Diastolic CHF/ Nonischemic Cardiomyopathy - the patient has a known reduced EF of 20% by echocardiogram in 03/2018 with cath showing nonobstructive CAD. Cardiac MRI showed nonischemic, non-compaction type cardiomyopathy  with biventricular involvement.  - At the time of her last office visit, she was continued on Toprol-XL with  Losartan being transitioned to Entresto 24-26 mg twice daily. She has not yet started Corlanor as this was declined by her insurance. Given her persistent hypotension, would not plan to start Corlanor at this time. BP also hinders further titration of Entresto or Toprol-XL and cannot add Spironolactone. - weight has trended up by 6 pounds on her home scales over the past 4 weeks but this has been a gradual adjustment and weight has actually been stable at 187 lbs over the past week. She does not appear volume overloaded by examination. Recommended she continue with her current Lasix dose of 40 mg daily and take an additional tablet for weight gain > 3 pounds overnight or 5 pounds in 1 week. Also reviewed sodium and fluid restriction. - She is already scheduled for a repeat echocardiogram in 06/2018. Continue with LifeVest at this time and it had previously been discussed with the patient by Dr. Oval Linsey the she should be on anticoagulation given her cardiomyopathy. If EF has improved, would plan to discontinue Eliquis at that time. If EF remains reduced, will need referral to EP for consideration of ICD placement and to genetic counseling for her children as previously discussed during her hospitalization. Would also consider referral to [facility] at that time if EF remains reduced given her young age and soft BP hindering further adjustments in her medical therapy.   2. 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  - will reassess pulmonary pressures at time of repeat echocardiogram in 06/2018. Recent Chest CT showed no evidence of intrinsic lung disease.   3. CAD - recent cardiac catheterization in 03/2018 showed 60% mid-LAD  stenosis but not significant by FFR. She denies any recent anginal symptoms.  - continue BB and statin therapy. Not on ASA given the need for anticoagulation.   4. HTN - She is now hypotensive with BP at 94/60 during today's visit given the recent transitions in her medication regimen due to her cardiomyopathy. - Will continue on current dosing of Toprol-XL and Entresto given that BP does not allow for further adjustment of this. If SBP starts to trend lower than 90, would reduce Toprol-XL from 37.5mg  daily to 25mg  daily.   5. IDDM - Hgb A1c at 8.2 by most recent check in 03/2018. Reports intermittent episodes of hypoglycemia with glucose in the 60's over the past several weeks. She has follow-up with her PCP on Monday and plans to review with him at that time.  Medication Adjustments/Labs and Tests Ordered: Current medicines are reviewed at length with the patient today.  Concerns regarding medicines are outlined above.  Medication changes, Labs and Tests ordered today are listed in the Patient Instructions below. Patient Instructions   Medication Instructions:  Your physician recommends that you continue on your current medications as directed. Please refer to the Current Medication list given to you today.   Labwork: none  Testing/Procedures: none  Follow-Up: Your physician recommends that you schedule a follow-up appointment in: 4-6 weeks   Any Other Special Instructions Will Be Listed Below (If Applicable).  If you need a refill on your cardiac medications before your next appointment, please call your pharmacy.  Limit daily fluid intake to less than 2 Liters per day. Please limit salt intake.  Please weight yourself every morning. Take an extra Lasix 40mg  if weight increases  by 3 pounds overnight or 5 pounds in a single week.    Two Gram Sodium Diet 2000 mg  What is Sodium? Sodium is a mineral found naturally in many foods. The most significant source of sodium in the  diet is table salt, which is about 40% sodium.  Processed, convenience, and preserved foods also contain a large amount of sodium.  The body needs only 500 mg of sodium daily to function,  A normal diet provides more than enough sodium even if you do not use salt.  Why Limit Sodium? A build up of sodium in the body can cause thirst, increased blood pressure, shortness of breath, and water retention.  Decreasing sodium in the diet can reduce edema and risk of heart attack or stroke associated with high blood pressure.  Keep in mind that there are many other factors involved in these health problems.  Heredity, obesity, lack of exercise, cigarette smoking, stress and what you eat all play a role.  General Guidelines:  Do not add salt at the table or in cooking.  One teaspoon of salt contains over 2 grams of sodium.  Read food labels  Avoid processed and convenience foods  Ask your dietitian before eating any foods not dicussed in the menu planning guidelines  Consult your physician if you wish to use a salt substitute or a sodium containing medication such as antacids.  Limit milk and milk products to 16 oz (2 cups) per day.  Shopping Hints:  READ LABELS!! "Dietetic" does not necessarily mean low sodium.  Salt and other sodium ingredients are often added to foods during processing.   Menu Planning Guidelines Food Group Choose More Often Avoid  Beverages (see also the milk group All fruit juices, low-sodium, salt-free vegetables juices, low-sodium carbonated beverages Regular vegetable or tomato juices, commercially softened water used for drinking or cooking  Breads and Cereals Enriched white, wheat, rye and pumpernickel bread, hard rolls and dinner rolls; muffins, cornbread and waffles; most dry cereals, cooked cereal without added salt; unsalted crackers and breadsticks; low sodium or homemade bread crumbs Bread, rolls and crackers with salted tops; quick breads; instant hot cereals;  pancakes; commercial bread stuffing; self-rising flower and biscuit mixes; regular bread crumbs or cracker crumbs  Desserts and Sweets Desserts and sweets mad with mild should be within allowance Instant pudding mixes and cake mixes  Fats Butter or margarine; vegetable oils; unsalted salad dressings, regular salad dressings limited to 1 Tbs; light, sour and heavy cream Regular salad dressings containing bacon fat, bacon bits, and salt pork; snack dips made with instant soup mixes or processed cheese; salted nuts  Fruits Most fresh, frozen and canned fruits Fruits processed with salt or sodium-containing ingredient (some dried fruits are processed with sodium sulfites        Vegetables Fresh, frozen vegetables and low- sodium canned vegetables Regular canned vegetables, sauerkraut, pickled vegetables, and others prepared in brine; frozen vegetables in sauces; vegetables seasoned with ham, bacon or salt pork  Condiments, Sauces, Miscellaneous  Salt substitute with physician's approval; pepper, herbs, spices; vinegar, lemon or lime juice; hot pepper sauce; garlic powder, onion powder, low sodium soy sauce (1 Tbs.); low sodium condiments (ketchup, chili sauce, mustard) in limited amounts (1 tsp.) fresh ground horseradish; unsalted tortilla chips, pretzels, potato chips, popcorn, salsa (1/4 cup) Any seasoning made with salt including garlic salt, celery salt, onion salt, and seasoned salt; sea salt, rock salt, kosher salt; meat tenderizers; monosodium glutamate; mustard, regular soy sauce, barbecue, sauce, chili  sauce, teriyaki sauce, steak sauce, Worcestershire sauce, and most flavored vinegars; canned gravy and mixes; regular condiments; salted snack foods, olives, picles, relish, horseradish sauce, catsup   Food preparation: Try these seasonings Meats:    Pork Sage, onion Serve with applesauce  Chicken Poultry seasoning, thyme, parsley Serve with cranberry sauce  Lamb Curry powder, rosemary, garlic,  thyme Serve with mint sauce or jelly  Veal Marjoram, basil Serve with current jelly, cranberry sauce  Beef Pepper, bay leaf Serve with dry mustard, unsalted chive butter  Fish [place] leaf, dill Serve with unsalted lemon butter, unsalted parsley butter  Vegetables:    Asparagus Lemon juice   Broccoli Lemon juice   Carrots Mustard dressing parsley, mint, nutmeg, glazed with unsalted butter and sugar   Green beans Marjoram, lemon juice, nutmeg,dill seed   Tomatoes Basil, marjoram, onion   Spice /blend for [employer] [facility]" 4 tsp ground thyme 1 tsp ground sage 3 tsp ground rosemary 4 tsp ground marjoram    Signed, Erma Heritage, PA-C  04/16/2018 5:33 PM    [facility] [address] [place], [place] [address] Phone: [phone] Fax: [phone]

## 2018-04-17 ENCOUNTER — Telehealth: Payer: Self-pay | Admitting: *Deleted

## 2018-04-17 NOTE — Telephone Encounter (Addendum)
Per Dr Richardson Landry:  Dr Richardson Landry reviewed her cardiologist note from this week and they recommend a work up(echo) of all first degree relatives( ie her daughter). Dr Richardson Landry recommends and appt for [facility] with him to discuss and schedule. Mother notified and scheduled office visit with Dr Richardson Landry.

## 2018-04-20 ENCOUNTER — Ambulatory Visit (INDEPENDENT_AMBULATORY_CARE_PROVIDER_SITE_OTHER): Payer: PRIVATE HEALTH INSURANCE | Admitting: Family Medicine

## 2018-04-20 ENCOUNTER — Encounter: Payer: Self-pay | Admitting: Family Medicine

## 2018-04-20 VITALS — BP 130/86 | Wt 193.8 lb

## 2018-04-20 DIAGNOSIS — J019 Acute sinusitis, unspecified: Secondary | ICD-10-CM | POA: Diagnosis not present

## 2018-04-20 DIAGNOSIS — E119 Type 2 diabetes mellitus without complications: Secondary | ICD-10-CM

## 2018-04-20 DIAGNOSIS — R7309 Other abnormal glucose: Secondary | ICD-10-CM | POA: Diagnosis not present

## 2018-04-20 DIAGNOSIS — I420 Dilated cardiomyopathy: Secondary | ICD-10-CM

## 2018-04-20 DIAGNOSIS — Z794 Long term (current) use of insulin: Secondary | ICD-10-CM

## 2018-04-20 MED ORDER — CEFPROZIL 500 MG PO TABS: ORAL_TABLET | ORAL | 0 refills | Status: DC

## 2018-04-20 MED ORDER — GLIPIZIDE 5 MG PO TABS: ORAL_TABLET | ORAL | 5 refills | Status: DC

## 2018-04-20 NOTE — Progress Notes (Signed)
   Subjective:    [name], female    DOB: 09/13/1970, 48 y.o.   MRN: [id number] Patient arrives with multiple concerns HPI Pt here today for one month follow up. Pt states she went into the hospital for a heart cath and is now having to wear a defibrillator 24 hours.    Getting used to the lifevest, wearing fairthfully  Pt was having soe discomfort with this, has calmed down  Will have repeat echocrdiogram   On multi meds now  Sugars "different", numbers have ropped so Pt states she is also still having a hacking cough; pt states she coughed up thick yellow mucus yesterday.   Pt having more frequent low sugar spells, gets sweaty and shadky,,  Pt states does not have low sugar spell    Review of Systems No headache, no major weight loss or weight gain, no chest pain no back pain abdominal pain no change in bowel habits complete ROS otherwise negative     Objective:   Physical Exam Alert and oriented, vitals reviewed and stable, NAD ENT-TM's and ext canals WNL bilat via otoscopic exam Soft palate, tonsils and post pharynx WNL via oropharyngeal exam Neck-symmetric, no masses; thyroid nonpalpable and nontender Pulmonary-no tachypnea or accessory muscle use; Clear without wheezes via auscultation Card--no abnrml murmurs, rhythm reg and rate WNL Carotid pulses symmetric, without bruits Life vest on the patient.       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  Now has a LifeVest.  On several medications in hopes of improving ventricular function.  Has been advised may need defibrillator if they cannot improve this  2.  Type 2 diabetes.  Worsening.  A1c done at the hospital in the low eights.  Patient having symptoms of both high and low sugar.  Blood sugars have been mostly early in the morning.  3.  Bronchitis by history productive of yellowish phlegm  Stop the glipizide.  Endocrinology referral with now  substantial comorbidity of heart disease.  Discussed.  Call next week with numbers.  Initiate antibiotics.  Symptom care discussed.  Follow-up in several months

## 2018-04-30 ENCOUNTER — Encounter: Payer: Self-pay | Admitting: Family Medicine

## 2018-05-05 ENCOUNTER — Other Ambulatory Visit (HOSPITAL_COMMUNITY): Payer: Self-pay | Admitting: Otolaryngology

## 2018-05-05 ENCOUNTER — Other Ambulatory Visit: Payer: Self-pay | Admitting: Otolaryngology

## 2018-05-05 DIAGNOSIS — E042 Nontoxic multinodular goiter: Secondary | ICD-10-CM

## 2018-05-08 ENCOUNTER — Other Ambulatory Visit: Payer: Self-pay | Admitting: Family Medicine

## 2018-05-11 ENCOUNTER — Telehealth: Payer: Self-pay | Admitting: Cardiology

## 2018-05-11 NOTE — Telephone Encounter (Signed)
Spoke with patient who states that she is having thyroid biopsy done on Thursday. Pt states she was told to stop Eliquis 2 days prior. She would like to clear it with Dr. Harl Bowie before holding medication. Please advise.

## 2018-05-11 NOTE — Telephone Encounter (Signed)
Patient needs to speak with nurse regarding medication that needs to be stopped prior to procedure. / tg

## 2018-05-11 NOTE — Telephone Encounter (Signed)
Yes ok to hold eliquis 2 days before, restart day after   Zandra Abts MD

## 2018-05-11 NOTE — Telephone Encounter (Signed)
Pt notified and voiced understanding 

## 2018-05-14 ENCOUNTER — Ambulatory Visit (HOSPITAL_COMMUNITY)
Admission: RE | Admit: 2018-05-14 | Discharge: 2018-05-14 | Disposition: A | Discharge status: Home / Self Care | Payer: PRIVATE HEALTH INSURANCE | Source: Ambulatory Visit | Attending: Otolaryngology | Admitting: Otolaryngology

## 2018-05-14 ENCOUNTER — Encounter (HOSPITAL_COMMUNITY): Payer: Self-pay

## 2018-05-14 DIAGNOSIS — D440 Neoplasm of uncertain behavior of thyroid gland: Secondary | ICD-10-CM | POA: Insufficient documentation

## 2018-05-14 DIAGNOSIS — E042 Nontoxic multinodular goiter: Secondary | ICD-10-CM | POA: Diagnosis not present

## 2018-05-14 MED ORDER — LIDOCAINE HCL (PF) 2 % IJ SOLN
INTRAMUSCULAR | Status: AC
  Filled 2018-05-14: qty 10

## 2018-05-22 ENCOUNTER — Other Ambulatory Visit: Payer: Self-pay | Admitting: Cardiology

## 2018-05-22 MED ORDER — METOPROLOL SUCCINATE ER 25 MG PO TB24: 37.5000 mg | ORAL_TABLET | Freq: Every day | ORAL | 11 refills | Status: DC

## 2018-05-27 ENCOUNTER — Encounter (HOSPITAL_COMMUNITY): Payer: Self-pay

## 2018-05-29 ENCOUNTER — Telehealth: Payer: Self-pay | Admitting: Family Medicine

## 2018-05-29 ENCOUNTER — Other Ambulatory Visit: Payer: Self-pay | Admitting: Family Medicine

## 2018-05-29 DIAGNOSIS — E041 Nontoxic single thyroid nodule: Secondary | ICD-10-CM

## 2018-05-29 NOTE — Telephone Encounter (Signed)
teogh would be good

## 2018-05-29 NOTE — Telephone Encounter (Signed)
Referral to Dr.Teoh has been placed.

## 2018-05-29 NOTE — Telephone Encounter (Signed)
Please advise. Thank you

## 2018-05-29 NOTE — Telephone Encounter (Signed)
Pt needs referral to new ENT  Pt's insurance changed and current ENT is not in network  Please initiate referral in system so that I may process  (Dr. Benjamine Mola or Dr. Lucia Gaskins - pt would like Dr. Jeannine Kitten recommendation)   Pt states current ENT won't give ultrasound results or biopsy results due to not being in network with her new [employer] coverage

## 2018-06-05 ENCOUNTER — Telehealth: Payer: Self-pay | Admitting: Family Medicine

## 2018-06-05 NOTE — Telephone Encounter (Signed)
Patient came by the office needing help with a billing question.  She also mentioned about going to See Dr. Janace Hoard at [facility] and having a thyroid biopsy done.  She can't do a follow up due to her insurance change and being out of network now so she can't get her results.  I did get her to sign a release for [place] to get her records since we were the referring doctor but I do see a path report already scanned in Epic. Can we give her the results for now and then she can follow up with new ENT once that referral is done?  Patient aware Dr. Richardson Landry not in the office today.

## 2018-06-07 NOTE — Telephone Encounter (Signed)
Has dr Tara Bryant office expressly said they will not give her results? It is very important the specialist who can interpret thee results should analyze in the context of the pt, that is done by the specialist

## 2018-06-08 ENCOUNTER — Other Ambulatory Visit: Payer: Self-pay | Admitting: Family Medicine

## 2018-06-08 ENCOUNTER — Encounter: Payer: Self-pay | Admitting: Cardiology

## 2018-06-08 ENCOUNTER — Ambulatory Visit: Payer: PRIVATE HEALTH INSURANCE | Admitting: Cardiology

## 2018-06-08 VITALS — BP 120/70 | HR 93 | Ht 67.5 in | Wt 194.8 lb

## 2018-06-08 DIAGNOSIS — I5022 Chronic systolic (congestive) heart failure: Secondary | ICD-10-CM

## 2018-06-08 DIAGNOSIS — I272 Pulmonary hypertension, unspecified: Secondary | ICD-10-CM | POA: Diagnosis not present

## 2018-06-08 NOTE — Telephone Encounter (Signed)
Left message to return call 

## 2018-06-08 NOTE — Progress Notes (Signed)
Clinical Summary Ms. Rutten is a 48 y.o.female seen today for follow up of the following medical problems.   1. 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 WEights 184-187 lbs and stable - some occasional LE edema. Limiting sodium intake.  - no recent SOB/DOE - her 49 year old was screened for noncompaction by pediatric cards at [facility], workup was negative.  2. Pulmonary hypertension - type II, her mean PA was 45 and PCWP 41 in setting of severel volume overload. Moderate RV dysfunction by MRI  3. CAD - moderate nonobstructive disease by cath  - no recent chest pain.      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 Arrhythmia   . CAD (coronary artery disease)    a. 03/2018: cath showing 60% mid-LAD stenosis not significant by FFR.   Marland Kitchen Chronic combined systolic (congestive) and diastolic (congestive) heart failure ([place])    a. 03/2018: found to have a newly reduced EF of 20% by echocardiogram  . Depression   . Diabetes mellitus   . Hypertension   . Microproteinuria      Allergies  Allergen Reactions  . Aspartame And Phenylalanine      Current Outpatient Medications  Medication Sig Dispense Refill  . apixaban (ELIQUIS) 5 MG TABS tablet Take 1 tablet (5 mg total) by mouth 2 (two) times daily. 180 tablet 3  . atorvastatin (LIPITOR) 10 MG tablet Take 1 tablet (10 mg total) by mouth daily. For cholesterol 30 tablet 5  . B-D ULTRAFINE III SHORT PEN 31G X 8 MM MISC Use  twice a daily to inject insulin 100 each 5  . B-D ULTRAFINE III SHORT PEN 31G X 8 MM MISC USE FOR INSULIN INJECTIONS TWICE DAILY 100 each 2  . benzonatate (TESSALON) 100 MG capsule Take 1 capsule (100 mg total) by mouth 3 (three) times daily as needed for cough. 24 capsule 0  . cefPROZIL (CEFZIL) 500 MG tablet Take one tablet by mouth twice daily for 10 days 20 tablet 0  . famotidine (PEPCID) 20 MG tablet TAKE 1 TABLET(20 MG) BY MOUTH TWICE DAILY 60 tablet 5  . furosemide (LASIX) 40 MG tablet Take 1 tablet (40 mg total) by mouth daily. 30 tablet 6  . glipiZIDE (GLUCOTROL) 5 MG tablet TAKE ONE HALF TABLET DAILY BY MOUTH 30 tablet 5  . glucose blood (CONTOUR NEXT TEST) test strip USE TO TEST BLOOD SUGAR TWICE DAILY AS DIRECTED 100 each 3  . Insulin Glargine (LANTUS SOLOSTAR) 100 UNIT/ML Solostar Pen ADMINISTER 75 UNITS UNDER THE SKIN EVERY NIGHT AT BEDTIME 15 mL 5  . metFORMIN (GLUCOPHAGE) 500 MG tablet Take 1 tablet (500 mg total) by mouth 2 (two) times daily with a meal. 180 tablet 1  . metoprolol succinate (TOPROL-XL) 25 MG 24 hr tablet Take 1.5 tablets (37.5 mg total) by mouth daily. 45 tablet 11  . NORLYDA 0.35 MG tablet Take 1 tablet by mouth daily.   12  .  sacubitril-valsartan (ENTRESTO) 24-26 MG Take 1 tablet by mouth twice daily. Please Honor Card patient is presenting for Carmie Kanner: [id number]; Juanna Cao: [phone]; WGYKZ: [id number]; ISSUER: [id number] ID: Pharmacy to complete 60 tablet 3   No current facility-administered medications for this visit.      Past Surgical History:  Procedure Laterality Date  . BACK SURGERY    . BREAST BIOPSY Left 2017   benign  . INTRAVASCULAR PRESSURE WIRE/FFR STUDY N/A 03/24/2018   Procedure: INTRAVASCULAR PRESSURE WIRE/FFR STUDY;  Surgeon: Leonie Man, MD;  Location: [facility];  Service: Cardiovascular;  Laterality: N/A;  . NOSE SURGERY    . RIGHT/LEFT HEART CATH AND CORONARY ANGIOGRAPHY N/A 03/24/2018   Procedure: RIGHT/LEFT HEART CATH AND  CORONARY ANGIOGRAPHY;  Surgeon: Leonie Man, MD;  Location: [facility];  Service: Cardiovascular;  Laterality: N/A;  . TONSILLECTOMY    . WISDOM TOOTH EXTRACTION       Allergies  Allergen Reactions  . Aspartame And Phenylalanine       Family History  Problem Relation Age of Onset  . Stroke Sister 79       smoker, diabetic, HTN  . Breast cancer Sister   . Cancer Mother        breast  . Breast cancer Mother   . Hypertension Mother   . Diabetes Other   . Heart attack Other   . Chronic Renal Failure Father      Social History Ms. Cadman reports that she has never smoked. She has never used smokeless tobacco. Ms. Bernath reports no history of alcohol use.   Review of Systems CONSTITUTIONAL: No weight loss, fever, chills, weakness or fatigue.  HEENT: Eyes: No visual loss, blurred vision, double vision or yellow sclerae.No hearing loss, sneezing, congestion, runny nose or sore throat.  SKIN: No rash or itching.  CARDIOVASCULAR: per hpi RESPIRATORY: No shortness of breath, cough or sputum.  GASTROINTESTINAL: No anorexia, nausea, vomiting or diarrhea. No abdominal pain or blood.  GENITOURINARY: No burning on urination, no polyuria NEUROLOGICAL: No headache, dizziness, syncope, paralysis, ataxia, numbness or tingling in the extremities. No change in bowel or bladder control.  MUSCULOSKELETAL: No muscle, back pain, joint pain or stiffness.  LYMPHATICS: No enlarged nodes. No history of splenectomy.  PSYCHIATRIC: No history of depression or anxiety.  ENDOCRINOLOGIC: No reports of sweating, cold or heat intolerance. No polyuria or polydipsia.  Marland Kitchen   Physical Examination Vitals:   06/08/18 1324  BP: 120/70  Pulse: 93  SpO2: 98%   Vitals:   06/08/18 1324  Weight: 194 lb 12.8 oz (88.4 kg)  Height: 5' 7.5" (1.715 m)    Gen: resting comfortably, no acute distress HEENT: no scleral icterus, pupils equal round and reactive, no palptable cervical adenopathy,    CV: RRR, no m/r/g, no jvd Resp: Clear to auscultation bilaterally GI: abdomen is soft, non-tender, non-distended, normal bowel sounds, no hepatosplenomegaly MSK: extremities are warm, no edema.  Skin: warm, no rash Neuro:  no focal deficits Psych: appropriate affect   Diagnostic Studies  03/2018 echo LVEF 20%, diffuse hypokinesis, cannot eval diastolic function, mild RV dysfunction, PASP 47, small pericardial effusion  03/2018 LHC/RHC  Prox LAD lesion is 55% stenosed. DFR 0.92  Otherwise angiographically normal coronary arteries.  Hemodynamic findings consistent with severe pulmonary hypertension.  LV end diastolic pressure is severely elevated.   SUMMARY:  Moderate single-vessel disease with roughly 60% mid LAD (DFR 0.92 -not physiologically significant)  Otherwise minimal CAD.  Severely elevated pulmonary  wedge pressure and LVEDP consistent with secondary pulmonary pretension from LV failure (ACUTE COMBINED SYSTOLIC AND DIASTOLIC HEART FAILURE)  Mild-moderately reduced cardiac output/index.  (Output 4.48, Index 2.2.)   03/2018 cardiac MRI FINDINGS: 1. Moderately dilated left ventricle with mild concentric hypertrophy and severe systolic function (LVEF = 27%). Severe diffuse hypokinesis and paradoxical septal motion. There is no late gadolinium enhancement in the left ventricular myocardium. There is significant hyper trabeculation of the left ventricle with ratio of non-compacted to compacted myocardium > 4:1.  LVEDD: 61 mm  LVESD: 51 mm  LVEDV: 170 ml  LVESV: 124 ml  SV: 46 ml  CO: 4.6 L/min  Myocardial mass: 166 g  2. Normal right ventricular size, thickness and moderate systolic dysfunction (LVEF = 30%). There are no regional wall motion abnormalities.  3.  Normal left and right atrial size.  4. Normal size of the aortic root, ascending aorta and pulmonary artery.  5.  Trivial mitral and mild tricuspid regurgitation.  6. Normal  pericardium. Mild circumferential pericardial effusion, moderate amount inferior to the heart (up to 11 mm).  IMPRESSION: 1. Moderately dilated left ventricle with mild concentric hypertrophy and severe systolic function (LVEF = 27%). Severe diffuse hypokinesis and paradoxical septal motion. There is no late gadolinium enhancement in the left ventricular myocardium. There is significant hyper trabeculation of the left ventricle with ratio of non-compacted to compacted myocardium > 4:1.  2. Normal right ventricular size, thickness and moderate systolic dysfunction (LVEF = 30%). There are no regional wall motion abnormalities.  3.  Normal left and right atrial size.  4. Normal size of the aortic root, ascending aorta and pulmonary artery.  5.  Trivial mitral and mild tricuspid regurgitation.  6. Normal pericardium. Mild circumferential pericardial effusion, moderate amount inferior to the heart (up to 11 mm).  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  Assessment and Plan  1. Chronic systolic HF - NICM, noncompaction CM by MRI - continue current therapy. F/u upcoming echo. If persistent LV systolic dysfunction may need consideration for ICD. If LVEF has normalized would not require, also could do away with her anticoagulation in setting of noncompaction. - no symptoms, continue current meds. SOft bp's have limited therapy. Bp's reasonable in clinic last few visits but occasional low ones at home. If LVEF remains low would try further tittration.    2. Pulmonary hypertension - group II, secondary to left sided heart disease - continue treatment of her LV systolic dysfunction.       Arnoldo Lenis, M.D.

## 2018-06-08 NOTE — Patient Instructions (Signed)
Medication Instructions: Your physician recommends that you continue on your current medications as directed. Please refer to the Current Medication list given to you today.   Labwork: NONE TODAY  Procedures/Testing: NONE TODAY  Follow-Up: 4-6 WEEKS WITH  Dr. Harl Bowie  Any Additional Special Instructions Will Be Listed Below (If Applicable).     If you need a refill on your cardiac medications before your next appointment, please call your pharmacy.

## 2018-06-09 ENCOUNTER — Other Ambulatory Visit: Payer: Self-pay | Admitting: Family Medicine

## 2018-06-09 ENCOUNTER — Encounter: Payer: Self-pay | Admitting: Family Medicine

## 2018-06-09 NOTE — Telephone Encounter (Signed)
Discussed with pt. Pt verbalized understanding.  °

## 2018-06-09 NOTE — Telephone Encounter (Signed)
Form from pharmacy stating that Lantus Solostar Pen Injectable 3 ml is not covered. Per Rx benefit plan alternative meds include Basaglar Inj 100 unit. Please advise. Thank you

## 2018-06-10 ENCOUNTER — Telehealth: Payer: Self-pay | Admitting: Cardiology

## 2018-06-10 ENCOUNTER — Ambulatory Visit (INDEPENDENT_AMBULATORY_CARE_PROVIDER_SITE_OTHER): Payer: PRIVATE HEALTH INSURANCE | Admitting: "Endocrinology

## 2018-06-10 ENCOUNTER — Encounter: Payer: Self-pay | Admitting: "Endocrinology

## 2018-06-10 VITALS — BP 123/74 | HR 84 | Ht 68.0 in | Wt 193.6 lb

## 2018-06-10 DIAGNOSIS — I10 Essential (primary) hypertension: Secondary | ICD-10-CM | POA: Diagnosis not present

## 2018-06-10 DIAGNOSIS — E782 Mixed hyperlipidemia: Secondary | ICD-10-CM | POA: Diagnosis not present

## 2018-06-10 DIAGNOSIS — E1165 Type 2 diabetes mellitus with hyperglycemia: Secondary | ICD-10-CM | POA: Insufficient documentation

## 2018-06-10 MED ORDER — INSULIN GLARGINE 100 UNIT/ML SOLOSTAR PEN: 60.0000 [IU] | PEN_INJECTOR | Freq: Every day | SUBCUTANEOUS | 5 refills | Status: DC

## 2018-06-10 NOTE — Progress Notes (Signed)
LeighAnn Charnee Turnipseed, CMA  

## 2018-06-10 NOTE — Patient Instructions (Signed)

## 2018-06-10 NOTE — Progress Notes (Signed)
Endocrinology Consult Note       06/10/2018, 11:55 AM   Subjective:    [name], female    DOB: Apr 17, 1948.  Tara Bryant is being seen in consultation for management of currently uncontrolled symptomatic diabetes requested by  Mikey Kirschner, MD.   Past Medical History:  Diagnosis Date  . Arrhythmia   . CAD (coronary artery disease)    a. 03/2018: cath showing 60% mid-LAD stenosis not significant by FFR.   Marland Kitchen Chronic combined systolic (congestive) and diastolic (congestive) heart failure ([place])    a. 03/2018: found to have a newly reduced EF of 20% by echocardiogram  . Depression   . Diabetes mellitus   . Hypertension   . Microproteinuria    Past Surgical History:  Procedure Laterality Date  . BACK SURGERY    . BREAST BIOPSY Left 2017   benign  . INTRAVASCULAR PRESSURE WIRE/FFR STUDY N/A 03/24/2018   Procedure: INTRAVASCULAR PRESSURE WIRE/FFR STUDY;  Surgeon: Leonie Man, MD;  Location: [facility];  Service: Cardiovascular;  Laterality: N/A;  . NOSE SURGERY    . RIGHT/LEFT HEART CATH AND CORONARY ANGIOGRAPHY N/A 03/24/2018   Procedure: RIGHT/LEFT HEART CATH AND CORONARY ANGIOGRAPHY;  Surgeon: Leonie Man, MD;  Location: [facility];  Service: Cardiovascular;  Laterality: N/A;  . TONSILLECTOMY    . WISDOM TOOTH EXTRACTION     Social History   Socioeconomic History  . Marital status: Married    Spouse name: Not on file  . Number of children: Not on file  . Years of education: Not on file  . Highest education level: Not on file  Occupational History  . Not on file  Social Needs  . Financial resource strain: Not on file  . Food insecurity:    Worry: Not on file    Inability: Not on file  . Transportation needs:    Medical: Not on file    Non-medical: Not on file  Tobacco Use  . Smoking status: Never Smoker  . Smokeless tobacco: Never  Used  Substance and Sexual Activity  . Alcohol use: No  . Drug use: No  . Sexual activity: Yes    Birth control/protection: Pill  Lifestyle  . Physical activity:    Days per week: Not on file    Minutes per session: Not on file  . Stress: Not on file  Relationships  . Social connections:    Talks on phone: Not on file    Gets together: Not on file    Attends religious service: Not on file    Active member of club or organization: Not on file    Attends meetings of clubs or organizations: Not on file    Relationship status: Not on file  Other Topics Concern  . Not on file  Social History Narrative  . Not on file   Outpatient Encounter Medications as of 06/10/2018  Medication Sig  . apixaban (ELIQUIS) 5 MG TABS tablet Take 1 tablet (5 mg total) by mouth 2 (two) times daily.  Marland Kitchen atorvastatin (LIPITOR) 10 MG tablet Take 1 tablet (10  mg total) by mouth daily. For cholesterol  . B-D ULTRAFINE III SHORT PEN 31G X 8 MM MISC Use twice a daily to inject insulin  . B-D ULTRAFINE III SHORT PEN 31G X 8 MM MISC USE FOR INSULIN INJECTIONS TWICE DAILY  . famotidine (PEPCID) 20 MG tablet TAKE 1 TABLET(20 MG) BY MOUTH TWICE DAILY  . furosemide (LASIX) 40 MG tablet Take 1 tablet (40 mg total) by mouth daily.  Marland Kitchen glucose blood (CONTOUR NEXT TEST) test strip USE TO TEST BLOOD SUGAR TWICE DAILY AS DIRECTED  . Insulin Glargine (LANTUS SOLOSTAR) 100 UNIT/ML Solostar Pen Inject 60 Units into the skin at bedtime.  . metFORMIN (GLUCOPHAGE) 500 MG tablet Take 1 tablet (500 mg total) by mouth 2 (two) times daily with a meal.  . metoprolol succinate (TOPROL-XL) 25 MG 24 hr tablet Take 1.5 tablets (37.5 mg total) by mouth daily.  . NORLYDA 0.35 MG tablet Take 1 tablet by mouth daily.   . sacubitril-valsartan (ENTRESTO) 24-26 MG Take 1 tablet by mouth twice daily. Please Honor Card patient is presenting for Carmie Kanner: [id number]; Juanna Cao: [phone]; IWLNL: [id number]; ISSUER: [id number] ID: Pharmacy to complete  .  [DISCONTINUED] benzonatate (TESSALON) 100 MG capsule Take 1 capsule (100 mg total) by mouth 3 (three) times daily as needed for cough. (Patient not taking: Reported on 06/10/2018)  . [DISCONTINUED] glipiZIDE (GLUCOTROL) 5 MG tablet TAKE ONE HALF TABLET DAILY BY MOUTH  . [DISCONTINUED] Insulin Glargine (LANTUS SOLOSTAR) 100 UNIT/ML Solostar Pen ADMINISTER 70 UNITS UNDER THE SKIN EVERY NIGHT AT BEDTIME   No facility-administered encounter medications on file as of 06/10/2018.     ALLERGIES: Allergies  Allergen Reactions  . Aspartame And Phenylalanine     VACCINATION STATUS: Immunization History  Administered Date(s) Administered  . Influenza Split 02/02/2013  . Influenza,inj,Quad PF,6+ Mos 01/11/2014, 12/15/2014, 12/15/2015, 01/03/2017, 01/28/2018  . Influenza-Unspecified 03/08/2011  . Pneumococcal Polysaccharide-23 03/30/2009  . Td 02/04/2006  . Tdap 02/26/2018    Diabetes  She presents for her initial diabetic visit. She has type 2 diabetes mellitus. Onset time: She was diagnosed at approximate age of 29 years. Her disease course has been worsening. There are no hypoglycemic associated symptoms. Pertinent negatives for hypoglycemia include no confusion, headaches, pallor or seizures. Associated symptoms include fatigue, polydipsia and polyuria. Pertinent negatives for diabetes include no chest pain and no polyphagia. There are no hypoglycemic complications. Symptoms are worsening. Diabetic complications include heart disease. Risk factors for coronary artery disease include diabetes mellitus, dyslipidemia, hypertension, family history and sedentary lifestyle. Current diabetic treatments: She is currently on Lantus 70 units nightly, glipizide 5 mg once a day, metformin 500 mg p.o. twice daily. Her weight is fluctuating minimally. She is following a generally unhealthy diet. When asked about meal planning, she reported none. She has had a previous visit with a dietitian. She never participates in  exercise. Her home blood glucose trend is fluctuating minimally. Her breakfast blood glucose range is generally 130-140 mg/dl. Her bedtime blood glucose range is generally 140-180 mg/dl. Her overall blood glucose range is 140-180 mg/dl. (She brings in her log showing fluctuating blood glucose profile ranging from 65-170 fasting.) An ACE inhibitor/angiotensin II receptor blocker is not being taken. Eye exam is current.  Hyperlipidemia  This is a chronic problem. The current episode started more than 1 year ago. Exacerbating diseases include diabetes. Pertinent negatives include no chest pain, myalgias or shortness of breath. Current antihyperlipidemic treatment includes statins. Risk factors for coronary artery disease include dyslipidemia, diabetes mellitus,  hypertension, a sedentary lifestyle and family history.  Hypertension  This is a chronic problem. The current episode started more than 1 year ago. Pertinent negatives include no chest pain, headaches, palpitations or shortness of breath. Risk factors for coronary artery disease include diabetes mellitus, dyslipidemia, family history and sedentary lifestyle. Past treatments include beta blockers. Hypertensive end-organ damage includes CAD/MI and heart failure.    Review of Systems  Constitutional: Positive for fatigue. Negative for chills, fever and unexpected weight change.  HENT: Negative for trouble swallowing and voice change.   Eyes: Negative for visual disturbance.  Respiratory: Negative for cough, shortness of breath and wheezing.   Cardiovascular: Negative for chest pain, palpitations and leg swelling.  Gastrointestinal: Negative for diarrhea, nausea and vomiting.  Endocrine: Positive for polydipsia and polyuria. Negative for cold intolerance, heat intolerance and polyphagia.  Musculoskeletal: Negative for arthralgias and myalgias.  Skin: Negative for color change, pallor, rash and wound.  Neurological: Negative for seizures and  headaches.  Psychiatric/Behavioral: Negative for confusion and suicidal ideas.    Objective:    BP 123/74   Pulse 84   Ht 5\' 8"  (1.727 m)   Wt 193 lb 9.6 oz (87.8 kg)   SpO2 99%   BMI 29.44 kg/m   Wt Readings from Last 3 Encounters:  06/10/18 193 lb 9.6 oz (87.8 kg)  06/08/18 194 lb 12.8 oz (88.4 kg)  04/20/18 193 lb 12.8 oz (87.9 kg)     Physical Exam Constitutional:      Appearance: She is well-developed.  HENT:     Head: Normocephalic and atraumatic.  Neck:     Musculoskeletal: Normal range of motion and neck supple.     Thyroid: No thyromegaly.     Trachea: No tracheal deviation.  Cardiovascular:     Rate and Rhythm: Normal rate and regular rhythm.  Pulmonary:     Effort: Pulmonary effort is normal.     Breath sounds: Normal breath sounds.  Abdominal:     General: Bowel sounds are normal.     Palpations: Abdomen is soft.     Tenderness: There is no abdominal tenderness. There is no guarding.  Musculoskeletal: Normal range of motion.  Skin:    General: Skin is warm and dry.     Coloration: Skin is not pale.     Findings: No erythema or rash.  Neurological:     Mental Status: She is alert and oriented to person, place, and time.     Cranial Nerves: No cranial nerve deficit.     Coordination: Coordination normal.     Deep Tendon Reflexes: Reflexes are normal and symmetric.  Psychiatric:        Judgment: Judgment normal.     CMP ( most recent) CMP     Component Value Date/Time   NA 137 04/13/2018 0959   NA 138 04/03/2018 1240   K 3.8 04/13/2018 0959   CL 102 04/13/2018 0959   CO2 26 04/13/2018 0959   GLUCOSE 179 (H) 04/13/2018 0959   BUN 15 04/13/2018 0959   BUN 20 04/03/2018 1240   CREATININE 0.81 04/13/2018 0959   CREATININE 0.74 04/06/2014 0934   CALCIUM 8.9 04/13/2018 0959   PROT 6.5 03/26/2017 0916   ALBUMIN 4.0 03/26/2017 0916   AST 17 03/26/2017 0916   ALT 30 03/26/2017 0916   ALKPHOS 79 03/26/2017 0916   BILITOT 0.4 03/26/2017 0916    GFRNONAA >60 04/13/2018 0959   GFRAA >60 04/13/2018 0959     Diabetic Labs (  most recent): Lab Results  Component Value Date   HGBA1C 8.2 (H) 03/25/2018   HGBA1C 7.4 (A) 01/15/2018   HGBA1C 8.2 08/11/2017     Lipid Panel ( most recent) Lipid Panel     Component Value Date/Time   CHOL 202 (H) 03/26/2017 0916   TRIG 117 03/26/2017 0916   HDL 45 03/26/2017 0916   CHOLHDL 4.5 (H) 03/26/2017 0916   CHOLHDL 4.1 04/06/2014 0934   VLDL 39 04/06/2014 0934   LDLCALC 134 (H) 03/26/2017 0916     March 12, 2018 TSH 2.13      Assessment & Plan:   1. Uncontrolled type 2 diabetes mellitus with hyperglycemia ([place])  - DIAVIAN FURGASON has currently uncontrolled symptomatic type 2 DM since  48 years of age,  with most recent A1c of 8.2 %. Recent labs reviewed. - I had a long discussion with her about the progressive nature of diabetes and the pathology behind its complications. -her diabetes is complicated by coronary artery disease/CHF and she remains at a high risk for more acute and chronic complications which include CAD, CVA, CKD, retinopathy, and neuropathy. These are all discussed in detail with her.  - I have counseled her on diet management and weight loss, by adopting a carbohydrate restricted/protein rich diet. - she admits that there is a room for improvement in her food and drink choices. - Suggestion is made for her to avoid simple carbohydrates  from her diet including Cakes, Sweet Desserts, Ice Cream, Soda (diet and regular), Sweet Tea, Candies, Chips, Cookies, Store Bought Juices, Alcohol in Excess of  1-2 drinks a day, Artificial Sweeteners,  Coffee Creamer, and "Sugar-free" Products. This will help patient to have more stable blood glucose profile and potentially avoid unintended weight gain.  - I encouraged her to switch to  unprocessed or minimally processed complex starch and increased protein intake (animal or plant source), fruits, and vegetables.  - she is advised  to stick to a routine mealtimes to eat 3 meals  a day and avoid unnecessary snacks ( to snack only to correct hypoglycemia).   - she will be scheduled with Jearld Fenton, RDN, CDE for individualized diabetes education.  - I have approached her with the following individualized plan to manage diabetes and patient agrees:   -Given her A1c near target at 8.2%, she will benefit from simplified treatment regimen.  She is not a candidate for prandial insulin at this time. -I discussed and lowered her Lantus to 60 units nightly, associated with strict monitoring of blood glucose 2 times a day-daily before breakfast and at bedtime.  - she is encouraged to call clinic for blood glucose levels less than 70 or above 300 mg /dl. - she is advised to continue metformin 500 mg p.o. twice daily, therapeutically suitable for patient . - her glipizide will be discontinued, risk outweighs benefit for this patient.  - she will be considered for incretin therapy as appropriate next visit.  - Patient specific target  A1c;  LDL, HDL, Triglycerides, and  Waist Circumference were discussed in detail.  2) Blood Pressure /Hypertension:  her blood pressure is controlled to target.   she is advised to continue her current medications including lisinopril 2.5 mg p.o. daily with breakfast . 3) Lipids/Hyperlipidemia:   Review of her recent lipid panel showed uncontrolled  LDL at 137 .  she  Is not on any statins.  She would like to see her next results before initiating statin therapy.   4)  Weight/Diet:  Body mass index is 29.44 kg/m.  -   I discussed with her the fact that loss of 5 - 10% of her  current body weight will have the most impact on her diabetes management.  CDE Consult will be initiated . Exercise, and detailed carbohydrates information provided  -  detailed on discharge instructions.  5) Chronic Care/Health Maintenance:  -she  is on ACEI medications and  is encouraged to initiate and continue to follow up  with Ophthalmology, Dentist,  Podiatrist at least yearly or according to recommendations, and advised to  stay away from smoking. I have recommended yearly flu vaccine and pneumonia vaccine at least every 5 years; moderate intensity exercise for up to 150 minutes weekly; and  sleep for at least 7 hours a day.  - she is  advised to maintain close follow up with Mikey Kirschner, MD for primary care needs, as well as her other providers for optimal and coordinated care.  - Time spent with the patient: 35 minutes, of which >50% was spent in obtaining information about her symptoms, reviewing her previous labs/studies, evaluations, and treatments, counseling her about her currently uncontrolled type 2 diabetes, hypertension, hyperlipidemia, and developing plans for long term treatment based on the latest standards of care/guidelines.    [name] participated in the discussions, expressed understanding, and voiced agreement with the above plans.  All questions were answered to her satisfaction. she is encouraged to contact clinic should she have any questions or concerns prior to her return visit.  Follow up plan: - Return in about 5 weeks (around 07/15/2018) for Meter, and Logs.  Glade Lloyd, MD [facility] [address] [place],  [address] Phone: [phone]  Fax: [phone]    06/10/2018, 11:55 AM  This note was partially dictated with voice recognition software. Similar sounding words can be transcribed inadequately or may not  be corrected upon review.

## 2018-06-10 NOTE — Telephone Encounter (Signed)
Patient sees Dr Dorris Fetch for diabetes and this was prescribed by Dr Dorris Fetch today- Pharmacy to contact Dr Dorris Fetch for alternative treatment.

## 2018-06-10 NOTE — Telephone Encounter (Signed)
Switch to basaglar exact same dose let pt know it is a one to one equivalent insulin to lantus so we use same dose ref prn

## 2018-06-10 NOTE — Addendum Note (Signed)
Addended by: Dairl Ponder on: 06/10/2018 04:04 PM   Modules accepted: Orders

## 2018-06-10 NOTE — Telephone Encounter (Signed)
Error

## 2018-06-11 ENCOUNTER — Telehealth: Payer: Self-pay | Admitting: Family Medicine

## 2018-06-11 ENCOUNTER — Telehealth: Payer: Self-pay

## 2018-06-11 DIAGNOSIS — E1165 Type 2 diabetes mellitus with hyperglycemia: Secondary | ICD-10-CM

## 2018-06-11 MED ORDER — BASAGLAR KWIKPEN 100 UNIT/ML ~~LOC~~ SOPN: 60.0000 [IU] | PEN_INJECTOR | Freq: Every day | SUBCUTANEOUS | 1 refills | Status: DC

## 2018-06-11 NOTE — Telephone Encounter (Signed)
error 

## 2018-06-11 NOTE — Telephone Encounter (Signed)
[employer] insurance will not pay for Lantus they ask to change her to [place] please advise?

## 2018-06-11 NOTE — Telephone Encounter (Signed)
Proceed and  switch it to [place] same dose.

## 2018-06-11 NOTE — Telephone Encounter (Signed)
Rx for Tara Bryant has been sent

## 2018-06-26 ENCOUNTER — Telehealth: Payer: Self-pay | Admitting: Internal Medicine

## 2018-06-26 NOTE — Telephone Encounter (Signed)
Patient has echo scheduled to reevaluate LV function  With corona virus will posptone to late May. Someone will call to reschedule

## 2018-06-29 ENCOUNTER — Telehealth: Payer: Self-pay | Admitting: Student

## 2018-06-29 NOTE — Telephone Encounter (Signed)
Left message on voicemail to return phone call/tg

## 2018-06-29 NOTE — Telephone Encounter (Signed)
Called pt. No answer. Left msg to call back.  

## 2018-07-03 ENCOUNTER — Other Ambulatory Visit (HOSPITAL_COMMUNITY): Payer: BLUE CROSS/BLUE SHIELD

## 2018-07-06 ENCOUNTER — Telehealth: Payer: Self-pay | Admitting: Cardiology

## 2018-07-06 NOTE — Telephone Encounter (Signed)
Fax received from CoverMyMeds for Authorization Follow-Up for Entresto 24-26 mg.  KEY: [id number] Last Name: Dowding DOB: 1971/01/26  Prior Authorization Request Form attached to fax--will need to be signed by prescriber Almyra Deforest, PA) and "Expected length of therapy" section needs to be filled out. Will place on Hao's box in nurse fax room.

## 2018-07-13 ENCOUNTER — Other Ambulatory Visit: Payer: Self-pay

## 2018-07-13 DIAGNOSIS — I429 Cardiomyopathy, unspecified: Secondary | ICD-10-CM

## 2018-07-13 MED ORDER — SACUBITRIL-VALSARTAN 24-26 MG PO TABS: ORAL_TABLET | ORAL | 3 refills | Status: DC

## 2018-07-14 ENCOUNTER — Telehealth: Payer: Self-pay | Admitting: Cardiology

## 2018-07-14 ENCOUNTER — Telehealth: Payer: Self-pay | Admitting: *Deleted

## 2018-07-14 NOTE — Telephone Encounter (Signed)
-----   Message from [place], [place] sent at 07/14/2018 12:23 PM EDT ----- PRIOR AUTH ENTRESTO 24-26 MG KEY: [id number]

## 2018-07-14 NOTE — Telephone Encounter (Signed)
Patient is asking if she can be put on something cheaper than Eliquis/tg

## 2018-07-14 NOTE — Telephone Encounter (Signed)
Entresto PA submitted to cover my meds.

## 2018-07-14 NOTE — Telephone Encounter (Signed)
Pt reports that Eliquis is $90 for a 3 month supply. Pt states that she will stay on Eliquis for now and speak with Dr. Harl Bowie regarding another anticoagulant at her next visit.

## 2018-07-16 ENCOUNTER — Other Ambulatory Visit: Payer: Self-pay

## 2018-07-16 ENCOUNTER — Telehealth: Payer: Self-pay | Admitting: Cardiology

## 2018-07-16 NOTE — Telephone Encounter (Signed)
Cover my meds called stated PRI AUTHORIZATION  Needs Medical criteria and  please resubmit ENTRESTO 24-26 mg with it for this pt.  Please give them a call if any questions using the KEY

## 2018-07-20 ENCOUNTER — Ambulatory Visit: Payer: BLUE CROSS/BLUE SHIELD | Admitting: Family Medicine

## 2018-07-21 ENCOUNTER — Ambulatory Visit: Payer: PRIVATE HEALTH INSURANCE | Admitting: Cardiology

## 2018-07-22 ENCOUNTER — Ambulatory Visit: Payer: PRIVATE HEALTH INSURANCE | Admitting: "Endocrinology

## 2018-07-27 ENCOUNTER — Other Ambulatory Visit: Payer: Self-pay

## 2018-07-28 NOTE — Telephone Encounter (Signed)
Called cover my meds and was told I needed to call the plan @ [phone], when I called the plan I was given a message saying due to circumstances the call center is closed.

## 2018-07-31 ENCOUNTER — Other Ambulatory Visit: Payer: Self-pay | Admitting: "Endocrinology

## 2018-07-31 DIAGNOSIS — E1165 Type 2 diabetes mellitus with hyperglycemia: Secondary | ICD-10-CM

## 2018-08-13 ENCOUNTER — Ambulatory Visit (INDEPENDENT_AMBULATORY_CARE_PROVIDER_SITE_OTHER): Payer: PRIVATE HEALTH INSURANCE | Admitting: Otolaryngology

## 2018-08-13 DIAGNOSIS — K1321 Leukoplakia of oral mucosa, including tongue: Secondary | ICD-10-CM

## 2018-08-13 DIAGNOSIS — D440 Neoplasm of uncertain behavior of thyroid gland: Secondary | ICD-10-CM

## 2018-08-20 ENCOUNTER — Other Ambulatory Visit: Payer: Self-pay

## 2018-08-20 ENCOUNTER — Ambulatory Visit (HOSPITAL_COMMUNITY)
Admission: RE | Admit: 2018-08-20 | Discharge: 2018-08-20 | Disposition: A | Discharge status: Home / Self Care | Payer: PRIVATE HEALTH INSURANCE | Source: Ambulatory Visit | Attending: Physician Assistant | Admitting: Physician Assistant

## 2018-08-20 DIAGNOSIS — I429 Cardiomyopathy, unspecified: Secondary | ICD-10-CM | POA: Diagnosis present

## 2018-08-20 DIAGNOSIS — I5022 Chronic systolic (congestive) heart failure: Secondary | ICD-10-CM | POA: Diagnosis present

## 2018-08-20 NOTE — Progress Notes (Signed)
*  PRELIMINARY RESULTS* Echocardiogram 2D Echocardiogram has been performed.  Tara Bryant 08/20/2018, 11:37 AM

## 2018-08-26 ENCOUNTER — Telehealth: Payer: Self-pay

## 2018-08-26 NOTE — Telephone Encounter (Signed)
Left a message for the patient to call back for her ECHO results

## 2018-08-26 NOTE — Progress Notes (Signed)
Left voice message for the patient to callback for lab results.

## 2018-08-27 ENCOUNTER — Other Ambulatory Visit: Payer: Self-pay | Admitting: *Deleted

## 2018-08-27 MED ORDER — METFORMIN HCL 500 MG PO TABS: 500.0000 mg | ORAL_TABLET | Freq: Two times a day (BID) | ORAL | 0 refills | Status: DC

## 2018-08-28 ENCOUNTER — Telehealth: Payer: Self-pay | Admitting: Cardiology

## 2018-08-28 NOTE — Telephone Encounter (Signed)

## 2018-09-02 ENCOUNTER — Other Ambulatory Visit: Payer: Self-pay

## 2018-09-02 ENCOUNTER — Encounter: Payer: Self-pay | Admitting: Cardiology

## 2018-09-02 ENCOUNTER — Telehealth (INDEPENDENT_AMBULATORY_CARE_PROVIDER_SITE_OTHER): Payer: PRIVATE HEALTH INSURANCE | Admitting: Cardiology

## 2018-09-02 VITALS — BP 125/97 | HR 95 | Ht 68.0 in | Wt 186.7 lb

## 2018-09-02 DIAGNOSIS — I5022 Chronic systolic (congestive) heart failure: Secondary | ICD-10-CM

## 2018-09-02 DIAGNOSIS — I428 Other cardiomyopathies: Secondary | ICD-10-CM

## 2018-09-02 MED ORDER — METOPROLOL SUCCINATE ER 50 MG PO TB24: 50.0000 mg | ORAL_TABLET | Freq: Every day | ORAL | 3 refills | Status: DC

## 2018-09-02 NOTE — Progress Notes (Signed)
Medication Instructions:  Increase TOPROL XL TO 50 MG DAILY   Labwork: NONE  Testing/Procedures: NONE  Follow-Up: Your physician recommends that you schedule a follow-up appointment in: [place] DR. [place]   Your physician recommends that you schedule a follow-up appointment in: [place] DR. ALLRED (NEW PT)   Any Other Special Instructions Will Be Listed Below (If Applicable). You have been referred to DR. ALLRED   You have been referred to [place]      If you need a refill on your cardiac medications before your next appointment, please call your pharmacy.

## 2018-09-02 NOTE — Progress Notes (Signed)
Virtual Visit via Telephone Note   This visit type was conducted due to national recommendations for restrictions regarding the COVID-19 Pandemic (e.g. social distancing) in an effort to limit this patient's exposure and mitigate transmission in our community.  Due to her co-morbid illnesses, this patient is at least at moderate risk for complications without adequate follow up.  This format is felt to be most appropriate for this patient at this time.  The patient did not have access to video technology/had technical difficulties with video requiring transitioning to audio format only (telephone).  All issues noted in this document were discussed and addressed.  No physical exam could be performed with this format.  Please refer to the patient's chart for her  consent to telehealth for [facility].   Date:  09/02/2018   ID:  Tara Bryant, DOB [birth year], MRN [id number]  Patient Location: Home Provider Location: Home  PCP:  Mikey Kirschner, MD  Cardiologist:  Carlyle Dolly, MD  Electrophysiologist:  None   Evaluation Performed:  Follow-Up Visit  Chief Complaint:  3 month follow up  History of Present Illness:    Tara Bryant is a 48 y.o. female seen today for follow up of the following medical problems.  1. 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    - 08/2018 echo: LVEF 25-30%, grade II diastolic dysfunction, mild RV dysfunction - no recent SOB or DOE. No  recent edema. Home weights stable around 185-186 lbs by her scales  - checks bps every other day, typically 120s/80s-90s - compliant with meds. Eliquis has been expensive, though recently got at a more affordable price - still wearing lifevest   2. Pulmonary hypertension - type II, her mean PA was 45 and PCWP 41 in setting of severel volume overload. Moderate RV dysfunction by MRI  3. 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    Past Medical History:  Diagnosis Date  . Arrhythmia   . CAD (coronary artery disease)    a. 03/2018: cath showing 60% mid-LAD stenosis not significant by FFR.   Marland Kitchen Chronic combined systolic (congestive) and diastolic (congestive) heart failure ([place])    a. 03/2018: found to have a newly reduced EF of 20% by echocardiogram  . Depression   . Diabetes mellitus   . Hypertension   . Microproteinuria    Past Surgical History:  Procedure Laterality Date  . BACK SURGERY    . BREAST BIOPSY Left 2017   benign  . INTRAVASCULAR PRESSURE WIRE/FFR STUDY N/A 03/24/2018   Procedure: INTRAVASCULAR PRESSURE WIRE/FFR STUDY;  Surgeon: Leonie Man, MD;  Location: [place] [facility];  Service: Cardiovascular;  Laterality: N/A;  . NOSE SURGERY    . RIGHT/LEFT HEART CATH AND CORONARY ANGIOGRAPHY N/A 03/24/2018  Procedure: RIGHT/LEFT HEART CATH AND CORONARY ANGIOGRAPHY;  Surgeon: Leonie Man, MD;  Location: [place] [facility];  Service: Cardiovascular;  Laterality: N/A;  . TONSILLECTOMY    . WISDOM TOOTH EXTRACTION       No outpatient medications have been marked as taking for the 09/02/18 encounter (Appointment) with Arnoldo Lenis, MD.     Allergies:   Aspartame and phenylalanine   Social History   Tobacco Use  . Smoking status: Never Smoker  .  Smokeless tobacco: Never Used  Substance Use Topics  . Alcohol use: No  . Drug use: No     Family Hx: The patient's family history includes Breast cancer in her mother and sister; Cancer in her mother; Chronic Renal Failure in her father; Diabetes in an other family member; Heart attack in an other family member; Hypertension in her mother; Stroke (age of onset: 35) in her sister.  ROS:   Please see the history of present illness.     All other systems reviewed and are negative.   Prior CV studies:   The following studies were reviewed today:  03/2018 echo LVEF 20%, diffuse hypokinesis, cannot eval diastolic function, mild RV dysfunction, PASP 47, small pericardial effusion  03/2018 LHC/RHC  Prox LAD lesion is 55% stenosed. DFR 0.92  Otherwise angiographically normal coronary arteries.  Hemodynamic findings consistent with severe pulmonary hypertension.  LV end diastolic pressure is severely elevated.  SUMMARY:  Moderate single-vessel disease with roughly 60% mid LAD (DFR 0.92 -not physiologically significant)  Otherwise minimal CAD.  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 (Output 4.48, Index 2.2.)   03/2018 cardiac MRI FINDINGS: 1. Moderately dilated left ventricle with mild concentric hypertrophy and severe systolic function (LVEF = 27%). Severe diffuse hypokinesis and paradoxical septal motion. There is no late gadolinium enhancement in the left ventricular myocardium. There is significant hyper trabeculation of the left ventricle with ratio of non-compacted to compacted myocardium > 4:1.  LVEDD: 61 mm  LVESD: 51 mm  LVEDV: 170 ml  LVESV: 124 ml  SV: 46 ml  CO: 4.6 L/min  Myocardial mass: 166 g  2. Normal right ventricular size, thickness and moderate systolic  dysfunction (LVEF = 30%). There are no regional wall motion abnormalities.  3. Normal left and right atrial size.  4. Normal size of the aortic root, ascending aorta and pulmonary artery.  5. Trivial mitral and mild tricuspid regurgitation.  6. Normal pericardium. Mild circumferential pericardial effusion, moderate amount inferior to the heart (up to 11 mm).  IMPRESSION: 1. Moderately dilated left ventricle with mild concentric hypertrophy and severe systolic function (LVEF = 27%). Severe diffuse hypokinesis and paradoxical septal motion. There is no late gadolinium enhancement in the left ventricular myocardium. There is significant hyper trabeculation of the left ventricle with ratio of non-compacted to compacted myocardium > 4:1.  2. Normal right ventricular size, thickness and moderate systolic dysfunction (LVEF = 30%). There are no regional wall motion abnormalities.  3. Normal left and right atrial size.  4. Normal size of the aortic root, ascending aorta and pulmonary artery.  5. Trivial mitral and mild tricuspid regurgitation.  6. Normal pericardium. Mild circumferential pericardial effusion, moderate amount inferior to the heart (up to 11 mm).  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   08/2018 echo IMPRESSIONS    1. The left ventricle  has severely reduced systolic function, with an ejection fraction of 25-30%. The cavity size was normal. There is mild concentric left ventricular hypertrophy. Left ventricular diastolic Doppler parameters are consistent with  pseudonormalization. Elevated left ventricular end-diastolic pressure Left ventricular diffuse hypokinesis.  2. The left ventricular apex appears trabeculated.  3. The right ventricle has mildly reduced systolic function. The cavity was normal. There is mildly increased right ventricular wall thickness.  4. The  mitral valve is grossly normal. Mild thickening of the mitral valve leaflet. Mitral valve regurgitation is mild to moderate by color flow Doppler.  5. The tricuspid valve is grossly normal.  6. The aortic valve is grossly normal. Mild thickening of the aortic valve.  7. The aortic root is normal in size and structure.   Labs/Other Tests and Data Reviewed:    EKG:  No ECG reviewed.  Recent Labs: 03/12/2018: TSH 2.130 03/17/2018: BNP 218.4 03/23/2018: Magnesium 1.7 04/03/2018: Hemoglobin 13.8; Platelets 304 04/13/2018: BUN 15; Creatinine, Ser 0.81; Potassium 3.8; Sodium 137   Recent Lipid Panel Lab Results  Component Value Date/Time   CHOL 202 (H) 03/26/2017 09:16 AM   TRIG 117 03/26/2017 09:16 AM   HDL 45 03/26/2017 09:16 AM   CHOLHDL 4.5 (H) 03/26/2017 09:16 AM   CHOLHDL 4.1 04/06/2014 09:34 AM   LDLCALC 134 (H) 03/26/2017 09:16 AM    Wt Readings from Last 3 Encounters:  06/10/18 193 lb 9.6 oz (87.8 kg)  06/08/18 194 lb 12.8 oz (88.4 kg)  04/20/18 193 lb 12.8 oz (87.9 kg)     Objective:    Vital Signs:   Today's Vitals   09/02/18 1148  BP: (!) 125/97  Pulse: 95  Weight: 186 lb 11.2 oz (84.7 kg)  Height: 5\' 8"  (1.727 m)   Body mass index is 28.39 kg/m.  Normal affect. Normal speech pattern and tone. Comfortable, no apparent distress.  ASSESSMENT & PLAN:     1. Chronic systolic HF/Noncompaction cardiomyopathy - NICM, noncompaction CM by MRI - medical therapy previously limited by low bp's, her bps have trended upward. We will increase toprol to 50mg  daily. She is awaiting entresto renewal approval from her insurance, once cleared would increase to 49/51mg  bid. - refer to EP for ICD consideration. Currently with lifevest in setting of noncompaction CM with low LVEF not improved with 5 months of medical therapy.  - due to young age and limited comorbidities, severe systolic dysfunction with limited improvement on medical therapy we will refer to CHF clinic. While she  does not need advanced therapies currently would like her to establish early.  - has been on anticoag in setting of noncompaction and low LVEF, continue at this time.      COVID-19 Education: The signs and symptoms of COVID-19 were discussed with the patient and how to seek care for testing (follow up with PCP or arrange E-visit).  The importance of social distancing was discussed today.  Time:   Today, I have spent 35 minutes with the patient with telehealth technology discussing the above problems.    Medication Adjustments/Labs and Tests Ordered: Current medicines are reviewed at length with the patient today.  Concerns regarding medicines are outlined above.   Tests Ordered: No orders of the defined types were placed in this encounter.   Medication Changes: No orders of the defined types were placed in this encounter.   Disposition:  Follow up 1 month  Signed, Carlyle Dolly, MD  09/02/2018 11:39 AM    [facility]

## 2018-09-03 ENCOUNTER — Other Ambulatory Visit (INDEPENDENT_AMBULATORY_CARE_PROVIDER_SITE_OTHER): Payer: Self-pay | Admitting: Otolaryngology

## 2018-09-03 ENCOUNTER — Other Ambulatory Visit: Payer: Self-pay

## 2018-09-03 ENCOUNTER — Ambulatory Visit (INDEPENDENT_AMBULATORY_CARE_PROVIDER_SITE_OTHER): Payer: PRIVATE HEALTH INSURANCE | Admitting: Otolaryngology

## 2018-09-03 DIAGNOSIS — D3709 Neoplasm of uncertain behavior of other specified sites of the oral cavity: Secondary | ICD-10-CM | POA: Diagnosis not present

## 2018-09-15 ENCOUNTER — Telehealth: Payer: Self-pay | Admitting: *Deleted

## 2018-09-15 ENCOUNTER — Encounter: Payer: Self-pay | Admitting: *Deleted

## 2018-09-15 NOTE — Telephone Encounter (Signed)
Spoke with ambetter of [place] and was given fax number to fax Dr Nelly Laurence letter so that pt may receive Entresto. Faxed and sent for scanning

## 2018-09-22 ENCOUNTER — Telehealth: Payer: Self-pay

## 2018-09-22 NOTE — Telephone Encounter (Signed)
LM for pt to call back so I can go over instructions for her VV with Dr Lovena Le on 6/18.

## 2018-09-23 NOTE — Telephone Encounter (Signed)
Follow up   Patient is returning your call per previous message. Please call.

## 2018-09-23 NOTE — Telephone Encounter (Signed)
.YOUR CARDIOLOGY TEAM HAS ARRANGED FOR AN E-VISIT FOR YOUR APPOINTMENT - PLEASE REVIEW IMPORTANT INFORMATION BELOW SEVERAL DAYS PRIOR TO YOUR APPOINTMENT  Due to the recent COVID-19 pandemic, we are transitioning in-person office visits to tele-medicine visits in an effort to decrease unnecessary exposure to our patients, their families, and staff. These visits are billed to your insurance just like a normal visit is. We also encourage you to sign up for MyChart if you have not already done so. You will need a smartphone if possible. For patients that do not have this, we can still complete the visit using a regular telephone but do prefer a smartphone to enable video when possible. You may have a family member that lives with you that can help. If possible, we also ask that you have a blood pressure cuff and scale at home to measure your blood pressure, heart rate and weight prior to your scheduled appointment. Patients with clinical needs that need an in-person evaluation and testing will still be able to come to the office if absolutely necessary. If you have any questions, feel free to call our office.     YOUR PROVIDER WILL BE USING THE FOLLOWING PLATFORM TO COMPLETE YOUR VISIT: Doximity-Phone Call  . IF USING MYCHART - How to Download the MyChart App to Your SmartPhone   - If Apple, go to [employer] and type in MyChart in the search bar and download the app. If Android, ask patient to go to [employer] and type in [place] in the search bar and download the app. The app is free but as with any other app downloads, your phone may require you to verify saved payment information or Apple/Android password.  - You will need to then log into the app with your MyChart username and password, and select [facility] as your healthcare provider to link the account.  - When it is time for your visit, go to the MyChart app, find appointments, and click Begin Video Visit. Be sure to Select Allow for your  device to access the Microphone and Camera for your visit. You will then be connected, and your provider will be with you shortly.  **If you have any issues connecting or need assistance, please contact MyChart service desk [phone])**  **If using a computer, in order to ensure the best quality for your visit, you will need to use either of the following Internet Browsers: Insurance underwriter or [employer]**  . IF USING DOXIMITY or [email or web address] - The staff will give you instructions on receiving your link to join the meeting the day of your visit.      2-3 DAYS BEFORE YOUR APPOINTMENT  You will receive a telephone call from one of our [facility] team members - your caller ID may say "Unknown caller." If this is a video visit, we will walk you through how to get the video launched on your phone. We will remind you check your blood pressure, heart rate and weight prior to your scheduled appointment. If you have an Apple Watch or Kardia, please upload any pertinent ECG strips the day before or morning of your appointment to [place]. Our staff will also make sure you have reviewed the consent and agree to move forward with your scheduled tele-health visit.     THE DAY OF YOUR APPOINTMENT  Approximately 15 minutes prior to your scheduled appointment, you will receive a telephone call from one of [facility] team - your caller ID may say "Unknown caller."  Our staff will confirm medications, vital signs for the day and any symptoms you may be experiencing. Please have this information available prior to the time of visit start. It may also be helpful for you to have a pad of paper and pen handy for any instructions given during your visit. They will also walk you through joining the smartphone meeting if this is a video visit.    CONSENT FOR TELE-HEALTH VISIT - PLEASE REVIEW  I hereby voluntarily request, consent and authorize [facility] and its employed or contracted physicians,  physician assistants, nurse practitioners or other licensed health care professionals (the Practitioner), to provide me with telemedicine health care services (the "Services") as deemed necessary by the treating Practitioner. I acknowledge and consent to receive the Services by the Practitioner via telemedicine. I understand that the telemedicine visit will involve communicating with the Practitioner through live audiovisual communication technology and the disclosure of certain medical information by electronic transmission. I acknowledge that I have been given the opportunity to request an in-person assessment or other available alternative prior to the telemedicine visit and am voluntarily participating in the telemedicine visit.  I understand that I have the right to withhold or withdraw my consent to the use of telemedicine in the course of my care at any time, without affecting my right to future care or treatment, and that the Practitioner or I may terminate the telemedicine visit at any time. I understand that I have the right to inspect all information obtained and/or recorded in the course of the telemedicine visit and may receive copies of available information for a reasonable fee.  I understand that some of the potential risks of receiving the Services via telemedicine include:  Marland Kitchen Delay or interruption in medical evaluation due to technological equipment failure or disruption; . Information transmitted may not be sufficient (e.g. poor resolution of images) to allow for appropriate medical decision making by the Practitioner; and/or  . In rare instances, security protocols could fail, causing a breach of personal health information.  Furthermore, I acknowledge that it is my responsibility to provide information about my medical history, conditions and care that is complete and accurate to the best of my ability. I acknowledge that Practitioner's advice, recommendations, and/or decision may be  based on factors not within their control, such as incomplete or inaccurate data provided by me or distortions of diagnostic images or specimens that may result from electronic transmissions. I understand that the practice of medicine is not an exact science and that Practitioner makes no warranties or guarantees regarding treatment outcomes. I acknowledge that I will receive a copy of this consent concurrently upon execution via email to the email address I last provided but may also request a printed copy by calling the office of [facility].    I understand that my insurance will be billed for this visit.   I have read or had this consent read to me. . I understand the contents of this consent, which adequately explains the benefits and risks of the Services being provided via telemedicine.  . I have been provided ample opportunity to ask questions regarding this consent and the Services and have had my questions answered to my satisfaction. . I give my informed consent for the services to be provided through the use of telemedicine in my medical care  By participating in this telemedicine visit I agree to the above.

## 2018-09-24 ENCOUNTER — Telehealth (INDEPENDENT_AMBULATORY_CARE_PROVIDER_SITE_OTHER): Payer: PRIVATE HEALTH INSURANCE | Admitting: Internal Medicine

## 2018-09-24 ENCOUNTER — Other Ambulatory Visit: Payer: Self-pay

## 2018-09-24 DIAGNOSIS — I5022 Chronic systolic (congestive) heart failure: Secondary | ICD-10-CM

## 2018-09-24 DIAGNOSIS — I428 Other cardiomyopathies: Secondary | ICD-10-CM | POA: Diagnosis not present

## 2018-09-24 NOTE — Progress Notes (Signed)
Electrophysiology TeleHealth Note   Due to national recommendations of social distancing due to COVID 19, an audio/video telehealth visit is felt to be most appropriate for this patient at this time.  See MyChart message from today for the patient's consent to telehealth for [facility].   Date:  09/24/2018   ID:  Tara Bryant, DOB 04-Feb-1971, MRN [id number]  Location: patient's home  Provider location: [address], [place]  Evaluation Performed: Follow-up visit  PCP:  Mikey Kirschner, MD  Cardiologist:  Carlyle Dolly, MD  Electrophysiologist:  Dr Lovena Le  Chief Complaint:  "I've been wearing this life vest."  History of Present Illness:    Tara Bryant is a 48 y.o. female who presents via audio/video conferencing for a telehealth visit today.  She has a h/o ventricular non-compaction and severe LV dysfunction, EF 30% by echo despite maximal medical therapy. She was diagnosed over 6 months ago. She has class 2 CHF symptoms. She has not received any therapies from her life vest.  Today, she denies symptoms of palpitations, chest pain or syncope.  The patient is otherwise without complaint today.  The patient denies symptoms of fevers, chills, cough, or new SOB worrisome for COVID 19.  Past Medical History:  Diagnosis Date  . Arrhythmia   . CAD (coronary artery disease)    a. 03/2018: cath showing 60% mid-LAD stenosis not significant by FFR.   Marland Kitchen Chronic combined systolic (congestive) and diastolic (congestive) heart failure ([place])    a. 03/2018: found to have a newly reduced EF of 20% by echocardiogram  . Depression   . Diabetes mellitus   . Hypertension   . Microproteinuria     Past Surgical History:  Procedure Laterality Date  . BACK SURGERY    . BREAST BIOPSY Left 2017   benign  . INTRAVASCULAR PRESSURE WIRE/FFR STUDY N/A 03/24/2018   Procedure: INTRAVASCULAR PRESSURE WIRE/FFR STUDY;  Surgeon: Leonie Man, MD;  Location: [facility];  Service: Cardiovascular;  Laterality: N/A;  . NOSE SURGERY    . RIGHT/LEFT HEART CATH AND CORONARY ANGIOGRAPHY N/A 03/24/2018   Procedure: RIGHT/LEFT HEART CATH AND CORONARY ANGIOGRAPHY;  Surgeon: Leonie Man, MD;  Location: [facility];  Service: Cardiovascular;  Laterality: N/A;  . TONSILLECTOMY    . WISDOM TOOTH EXTRACTION      Current Outpatient Medications  Medication Sig Dispense Refill  . apixaban (ELIQUIS) 5 MG TABS tablet Take 1 tablet (5 mg total) by mouth 2 (two) times daily. 180 tablet 3  . atorvastatin (LIPITOR) 10 MG tablet Take 1 tablet (10 mg total) by mouth daily. For cholesterol 30 tablet 5  . B-D ULTRAFINE III SHORT PEN 31G X 8 MM MISC Use twice a daily to inject insulin 100 each 5  . B-D ULTRAFINE III SHORT PEN 31G X 8 MM MISC USE FOR INSULIN INJECTIONS TWICE DAILY 100 each 2  . famotidine (PEPCID) 20 MG tablet TAKE 1 TABLET(20 MG) BY MOUTH TWICE DAILY 60 tablet 5  . furosemide (LASIX) 40 MG tablet Take 1 tablet (40 mg total) by mouth daily. 30 tablet 6  . glucose blood (CONTOUR NEXT TEST) test strip USE TO TEST BLOOD SUGAR TWICE DAILY AS DIRECTED 100 each 3  . Insulin Glargine (BASAGLAR KWIKPEN) 100 UNIT/ML SOPN INJECT 60 UNITS UNDER THE SKIN INTO THE SKIN AT BEDTIME 15 mL 2  . metFORMIN (GLUCOPHAGE) 500 MG tablet Take 1 tablet (500 mg total) by mouth  2 (two) times daily with a meal. Call office to schedule appointment 180 tablet 0  . metoprolol succinate (TOPROL XL) 50 MG 24 hr tablet Take 1 tablet (50 mg total) by mouth daily. Take with or immediately following a meal. 90 tablet 3  . NORLYDA 0.35 MG tablet Take 1 tablet by mouth daily.   12  . sacubitril-valsartan (ENTRESTO) 24-26 MG Take 1 tablet by mouth twice daily. Please Honor Card patient is presenting for Carmie Kanner: [id number]; Juanna Cao: [id number]; NIDPO: [id number]; ISSUER: [id number] ID: Pharmacy to complete 60 tablet 3   No current facility-administered medications for this visit.     Allergies:    Aspartame and phenylalanine   Social History:  The patient  reports that she has never smoked. She has never used smokeless tobacco. She reports that she does not drink alcohol or use drugs.   Family History:  The patient's  family history includes Breast cancer in her mother and sister; Cancer in her mother; Chronic Renal Failure in her father; Diabetes in an other family member; Heart attack in an other family member; Hypertension in her mother; Stroke (age of onset: 24) in her sister.   ROS:  Please see the history of present illness.   All other systems are personally reviewed and negative.    Exam:    Vital Signs:  There were no vitals taken for this visit.  Labs/Other Tests and Data Reviewed:    Recent Labs: 03/12/2018: TSH 2.130 03/17/2018: BNP 218.4 03/23/2018: Magnesium 1.7 04/03/2018: Hemoglobin 13.8; Platelets 304 04/13/2018: BUN 15; Creatinine, Ser 0.81; Potassium 3.8; Sodium 137   Wt Readings from Last 3 Encounters:  09/02/18 186 lb 11.2 oz (84.7 kg)  06/10/18 193 lb 9.6 oz (87.8 kg)  06/08/18 194 lb 12.8 oz (88.4 kg)     Other studies personally reviewed: Additional studies/ records that were reviewed today include:   ASSESSMENT & PLAN:    1.  Chronic systolic heart failure - she has class 2 symptoms. I have discussed the treatment options with regard to her ICD including a S-ICD or standard ICD. She is reflecting. I will see her in the office for additional consideration in the next couple of weeks.  2. PUlmonary HTN - this is moderate with moderate RV dysfunction.  3. CAD - she has non-obstructive disease by left heart cath. 4. COVID 19 screen The patient denies symptoms of COVID 19 at this time.  The importance of social distancing was discussed today.  Follow-up:  4 months Next remote: 4 months  Current medicines are reviewed at length with the patient today.   The patient does not have concerns regarding her medicines.  The following changes were made  today:  none  Labs/ tests ordered today include: none No orders of the defined types were placed in this encounter.    Patient Risk:  after full review of this patients clinical status, I feel that they are at moderate risk at this time.  Today, I have spent 30 minutes with the patient with telehealth technology discussing all of the above .    Signed, Cristopher Peru, MD  09/24/2018 2:57 PM     [place] [address] [phone] (office) [phone] (fax)

## 2018-09-29 ENCOUNTER — Other Ambulatory Visit (HOSPITAL_COMMUNITY)
Admission: RE | Admit: 2018-09-29 | Discharge: 2018-09-29 | Disposition: A | Discharge status: Home / Self Care | Payer: PRIVATE HEALTH INSURANCE | Source: Ambulatory Visit | Attending: "Endocrinology | Admitting: "Endocrinology

## 2018-09-29 ENCOUNTER — Other Ambulatory Visit: Payer: Self-pay

## 2018-09-29 DIAGNOSIS — E1165 Type 2 diabetes mellitus with hyperglycemia: Secondary | ICD-10-CM | POA: Diagnosis present

## 2018-09-29 DIAGNOSIS — E782 Mixed hyperlipidemia: Secondary | ICD-10-CM | POA: Diagnosis present

## 2018-09-29 LAB — COMPREHENSIVE METABOLIC PANEL
ALT: 15 U/L (ref 0–44)
AST: 14 U/L — ABNORMAL LOW (ref 15–41)
Albumin: 3.9 g/dL (ref 3.5–5.0)
Alkaline Phosphatase: 78 U/L (ref 38–126)
Anion gap: 11 (ref 5–15)
BUN: 16 mg/dL (ref 6–20)
CO2: 25 mmol/L (ref 22–32)
Calcium: 9 mg/dL (ref 8.9–10.3)
Chloride: 102 mmol/L (ref 98–111)
Creatinine, Ser: 0.73 mg/dL (ref 0.44–1.00)
GFR calc Af Amer: >60 mL/min (ref >60)
GFR calc non Af Amer: >60 mL/min (ref >60)
Glucose, Bld: 143 mg/dL — ABNORMAL HIGH (ref 70–99)
Potassium: 4.3 mmol/L (ref 3.5–5.1)
Sodium: 138 mmol/L (ref 135–145)
Total Bilirubin: 0.8 mg/dL (ref 0.3–1.2)
Total Protein: 7.3 g/dL (ref 6.5–8.1)

## 2018-09-29 LAB — LIPID PANEL
Cholesterol: 158 mg/dL (ref 0–200)
HDL: 36 mg/dL — ABNORMAL LOW (ref >40)
LDL Cholesterol: 100 mg/dL — ABNORMAL HIGH (ref 0–99)
Total CHOL/HDL Ratio: 4.4 RATIO
Triglycerides: 109 mg/dL (ref <150)
VLDL: 22 mg/dL (ref 0–40)

## 2018-09-29 LAB — HEMOGLOBIN A1C
Hgb A1c MFr Bld: 9.3 % — ABNORMAL HIGH (ref 4.8–5.6)
Mean Plasma Glucose: 220.21 mg/dL

## 2018-09-29 LAB — T4, FREE: Free T4: 0.76 ng/dL (ref 0.61–1.12)

## 2018-09-29 LAB — TSH: TSH: 1.86 u[IU]/mL (ref 0.350–4.500)

## 2018-09-30 ENCOUNTER — Telehealth (INDEPENDENT_AMBULATORY_CARE_PROVIDER_SITE_OTHER): Payer: PRIVATE HEALTH INSURANCE | Admitting: Cardiology

## 2018-09-30 ENCOUNTER — Encounter: Payer: Self-pay | Admitting: Cardiology

## 2018-09-30 VITALS — BP 110/68 | HR 94 | Ht 68.0 in | Wt 189.0 lb

## 2018-09-30 DIAGNOSIS — I5022 Chronic systolic (congestive) heart failure: Secondary | ICD-10-CM | POA: Diagnosis not present

## 2018-09-30 LAB — MICROALBUMIN / CREATININE URINE RATIO
Creatinine, Urine: 286.9 mg/dL
Microalb Creat Ratio: 12 mg/g creat (ref 0–29)
Microalb, Ur: 33.1 ug/mL — ABNORMAL HIGH

## 2018-09-30 LAB — VITAMIN D 25 HYDROXY (VIT D DEFICIENCY, FRACTURES): Vit D, 25-Hydroxy: 20.8 ng/mL — ABNORMAL LOW (ref 30.0–100.0)

## 2018-09-30 MED ORDER — METOPROLOL SUCCINATE ER 100 MG PO TB24: 100.0000 mg | ORAL_TABLET | Freq: Every day | ORAL | 1 refills | Status: DC

## 2018-09-30 NOTE — Addendum Note (Signed)
Addended by: Julian Hy T on: 09/30/2018 02:15 PM   Modules accepted: Orders

## 2018-09-30 NOTE — Patient Instructions (Signed)
Your physician recommends that you schedule a follow-up appointment in: [place] has recommended you make the following change in your medication:   INCREASE TOPROL XL 100 MG DAILY   You have been referred to CHF CLINIC   Thank you for choosing [facility]!!

## 2018-09-30 NOTE — Progress Notes (Signed)
Virtual Visit via Telephone Note   This visit type was conducted due to national recommendations for restrictions regarding the COVID-19 Pandemic (e.g. social distancing) in an effort to limit this patient's exposure and mitigate transmission in our community.  Due to her co-morbid illnesses, this patient is at least at moderate risk for complications without adequate follow up.  This format is felt to be most appropriate for this patient at this time.  The patient did not have access to video technology/had technical difficulties with video requiring transitioning to audio format only (telephone).  All issues noted in this document were discussed and addressed.  No physical exam could be performed with this format.  Please refer to the patient's chart for her  consent to telehealth for [facility].   Date:  09/30/2018   ID:  [name], DOB [birth year], MRN [id number]  Patient Location: Home Provider Location: Office  PCP:  Mikey Kirschner, MD  Cardiologist:  Carlyle Dolly, MD  Electrophysiologist:  None   Evaluation Performed:  Follow-Up Visit  Chief Complaint:  Chronic systolic HF   History of Present Illness:    Tara Bryant is a 48 y.o. female seen today for follow up of the following medical problems.  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    - 08/2018 echo: LVEF 25-30%, grade II diastolic dysfunction, mild RV dysfunction - no recent SOB or DOE  - waiting on insurance approval on entresto, still has samples at home she is taking.  - tolerating high dose of toprol without troubles  - discussed with Dr Lovena Le, wished to think over ICD at this time. Has f/u July 9. Still wearing lifevest - has not heard from CHF clinic about referal   2. Pulmonary hypertension - type II, her mean PA was 45 and PCWP 41 in setting of severel volume overload. Moderate RV dysfunction by MRI  3. 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    Past Medical History:  Diagnosis Date  . Arrhythmia   . CAD (coronary artery disease)    a. 03/2018: cath showing 60% mid-LAD stenosis not significant by FFR.   Marland Kitchen Chronic combined systolic (congestive) and diastolic (congestive) heart failure ([place])    a. 03/2018: found to have a newly reduced EF of 20% by echocardiogram  . Depression   . Diabetes mellitus   . Hypertension   . Microproteinuria    Past Surgical History:  Procedure Laterality Date  . BACK SURGERY    . BREAST BIOPSY Left 2017   benign  . INTRAVASCULAR PRESSURE WIRE/FFR STUDY N/A 03/24/2018   Procedure: INTRAVASCULAR PRESSURE WIRE/FFR STUDY;  Surgeon: Leonie Man, MD;  Location: [facility];  Service: Cardiovascular;  Laterality: N/A;  . NOSE SURGERY    .  RIGHT/LEFT HEART CATH AND CORONARY ANGIOGRAPHY N/A 03/24/2018   Procedure: RIGHT/LEFT HEART CATH AND CORONARY ANGIOGRAPHY;  Surgeon: Leonie Man, MD;  Location: [facility];  Service: Cardiovascular;  Laterality: N/A;  . TONSILLECTOMY    . WISDOM TOOTH EXTRACTION       No outpatient medications have been marked as taking for the 09/30/18 encounter (Appointment) with Arnoldo Lenis, MD.     Allergies:   Aspartame and phenylalanine   Social  History   Tobacco Use  . Smoking status: Never Smoker  . Smokeless tobacco: Never Used  Substance Use Topics  . Alcohol use: No  . Drug use: No     Family Hx: The patient's family history includes Breast cancer in her mother and sister; Cancer in her mother; Chronic Renal Failure in her father; Diabetes in an other family member; Heart attack in an other family member; Hypertension in her mother; Stroke (age of onset: 3) in her sister.  ROS:   Please see the history of present illness.     All other systems reviewed and are negative.   Prior CV studies:   The following studies were reviewed today:  03/2018 echo LVEF 20%, diffuse hypokinesis, cannot eval diastolic function, mild RV dysfunction, PASP 47, small pericardial effusion  03/2018 LHC/RHC  Prox LAD lesion is 55% stenosed. DFR 0.92  Otherwise angiographically normal coronary arteries.  Hemodynamic findings consistent with severe pulmonary hypertension.  LV end diastolic pressure is severely elevated.  SUMMARY:  Moderate single-vessel disease with roughly 60% mid LAD (DFR 0.92 -not physiologically significant)  Otherwise minimal CAD.  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 (Output 4.48, Index 2.2.)   03/2018 cardiac MRI FINDINGS: 1. Moderately dilated left ventricle with mild concentric hypertrophy and severe systolic function (LVEF = 27%). Severe diffuse hypokinesis and paradoxical septal motion. There is no late gadolinium enhancement in the left ventricular myocardium. There is significant hyper trabeculation of the left ventricle with ratio of non-compacted to compacted myocardium > 4:1.  LVEDD: 61 mm  LVESD: 51 mm  LVEDV: 170 ml  LVESV: 124 ml  SV: 46 ml  CO: 4.6 L/min  Myocardial mass: 166 g  2.  Normal right ventricular size, thickness and moderate systolic dysfunction (LVEF = 30%). There are no regional wall motion abnormalities.  3. Normal left and right atrial size.  4. Normal size of the aortic root, ascending aorta and pulmonary artery.  5. Trivial mitral and mild tricuspid regurgitation.  6. Normal pericardium. Mild circumferential pericardial effusion, moderate amount inferior to the heart (up to 11 mm).  IMPRESSION: 1. Moderately dilated left ventricle with mild concentric hypertrophy and severe systolic function (LVEF = 27%). Severe diffuse hypokinesis and paradoxical septal motion. There is no late gadolinium enhancement in the left ventricular myocardium. There is significant hyper trabeculation of the left ventricle with ratio of non-compacted to compacted myocardium > 4:1.  2. Normal right ventricular size, thickness and moderate systolic dysfunction (LVEF = 30%). There are no regional wall motion abnormalities.  3. Normal left and right atrial size.  4. Normal size of the aortic root, ascending aorta and pulmonary artery.  5. Trivial mitral and mild tricuspid regurgitation.  6. Normal pericardium. Mild circumferential pericardial effusion, moderate amount inferior to the heart (up to 11 mm).  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  08/2018 echo IMPRESSIONS   1. The left ventricle has severely reduced systolic function, with an ejection fraction of 25-30%. The cavity size was normal. There is mild concentric left ventricular hypertrophy. Left ventricular diastolic Doppler parameters are consistent with  pseudonormalization. Elevated left ventricular end-diastolic pressure Left ventricular diffuse hypokinesis. 2. The left ventricular apex appears trabeculated. 3. The right ventricle has mildly reduced systolic function. The cavity was normal. There is  mildly increased right ventricular wall thickness. 4. The mitral valve is grossly normal. Mild thickening of the mitral valve leaflet. Mitral valve regurgitation is mild to moderate by color flow Doppler. 5. The tricuspid valve is grossly normal. 6. The aortic valve is grossly normal. Mild thickening of the aortic valve. 7. The aortic root is normal in size and structure.   Labs/Other Tests and Data Reviewed:    EKG:  No ECG reviewed.  Recent Labs: 03/17/2018: BNP 218.4 03/23/2018: Magnesium 1.7 04/03/2018: Hemoglobin 13.8; Platelets 304 09/29/2018: ALT 15; BUN 16; Creatinine, Ser 0.73; Potassium 4.3; Sodium 138; TSH 1.860   Recent Lipid Panel Lab Results  Component Value Date/Time   CHOL 158 09/29/2018 12:06 PM   CHOL 202 (H) 03/26/2017 09:16 AM   TRIG 109 09/29/2018 12:06 PM   HDL 36 (L) 09/29/2018 12:06 PM   HDL 45 03/26/2017 09:16 AM   CHOLHDL 4.4 09/29/2018 12:06 PM   LDLCALC 100 (H) 09/29/2018 12:06 PM   LDLCALC 134 (H) 03/26/2017 09:16 AM    Wt Readings from Last 3 Encounters:  09/02/18 186 lb 11.2 oz (84.7 kg)  06/10/18 193 lb 9.6 oz (87.8 kg)  06/08/18 194 lb 12.8 oz (88.4 kg)     Objective:    Vital Signs:   Today's Vitals   09/30/18 1333  BP: 110/68  Pulse: 94  Weight: 189 lb (85.7 kg)  Height: 5\' 8"  (1.727 m)   Body mass index is 28.74 kg/m.  Normal affect. Normal speech pattern and tone. Comfortable, no apparent distress. No audible signs of SOB or wheezing.   ASSESSMENT & PLAN:    1.Chronic systolic HF/Noncompaction cardiomyopathy - NICM, noncompaction CM by MRI - medical therapy previously limited by low bp's, her bps have trended upward.  - increase toprol to 100mg  daily. Once entresto cleared by her insurance would titrate doses, currently has samples at home - has f/u with EP to further discuss ICD, continue lifevest - we will refer to CHF clinic.  - has been on anticoag in setting of noncompaction and low LVEF, continue at this time.  On eliquis, some cost issues but continuing. If neccesary can change to coumadin    F/u 3 weeks for further med titration    COVID-19 Education: The signs and symptoms of COVID-19 were discussed with the patient and how to seek care for testing (follow up with PCP or arrange E-visit).  The importance of social distancing was discussed today.  Time:   Today, I have spent 13 minutes with the patient with telehealth technology discussing the above problems.     Medication Adjustments/Labs and Tests Ordered: Current medicines are reviewed at length with the patient today.  Concerns regarding medicines are outlined above.   Tests Ordered: No orders of the defined types were placed in this encounter.   Medication Changes: No orders of the defined types were placed in this encounter.   Follow Up:  Virtual Visit in 3 week(s)  Signed, Carlyle Dolly, MD  09/30/2018 1:32 PM    [facility]

## 2018-10-02 ENCOUNTER — Ambulatory Visit: Payer: PRIVATE HEALTH INSURANCE | Admitting: Cardiology

## 2018-10-07 ENCOUNTER — Telehealth: Payer: Self-pay | Admitting: Cardiology

## 2018-10-07 NOTE — Telephone Encounter (Signed)
Patient called because atorvastatin 10 mg was removed from her med list at her last VV AVS and she wanted to know if she was to continue taking it. Patient confirmed that she has been taking atorvastatin 10 mg daily. Atorvastatin 10 mg added back to medication list and advised to stay on it. Verbalized understanding.

## 2018-10-07 NOTE — Telephone Encounter (Signed)
Patient called stating that she needs to know if she is supposed to stop taking the Lipitor. [phone].

## 2018-10-15 ENCOUNTER — Other Ambulatory Visit: Payer: Self-pay

## 2018-10-15 ENCOUNTER — Ambulatory Visit (INDEPENDENT_AMBULATORY_CARE_PROVIDER_SITE_OTHER): Payer: PRIVATE HEALTH INSURANCE | Admitting: Internal Medicine

## 2018-10-15 ENCOUNTER — Encounter: Payer: Self-pay | Admitting: Internal Medicine

## 2018-10-15 VITALS — BP 124/76 | HR 68 | Temp 98.2°F | Ht 67.5 in | Wt 195.0 lb

## 2018-10-15 DIAGNOSIS — I420 Dilated cardiomyopathy: Secondary | ICD-10-CM

## 2018-10-15 DIAGNOSIS — Z01818 Encounter for other preprocedural examination: Secondary | ICD-10-CM

## 2018-10-15 NOTE — Patient Instructions (Signed)
Medication Instructions: Your physician recommends that you continue on your current medications as directed. Please refer to the Current Medication list given to you today.  Labwork: Cbc, bmet  Procedures/Testing: We will call you to schedule ICD in 2 weeks  Follow-Up: To be determined after ICD implantation   Any Additional Special Instructions Will Be Listed Below (If Applicable).     If you need a refill on your cardiac medications before your next appointment, please call your pharmacy.

## 2018-10-15 NOTE — Progress Notes (Signed)
HPI Ms. Dino returns today for ongoing evaluation and management of her chronic systolic heart failure and consideration for ICD insertion.  She is a 48 year old woman with a nonischemic cardiomyopathy, chronic systolic heart failure, who is been on maximal medical therapy.  I discussed ICD insertion with the patient several weeks ago and she preferred to have an additional face-to-face discussion.  The patient has not had syncope.  She is wearing a LifeVest.  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  Allergies  Allergen Reactions  . Aspartame And Phenylalanine      Current Outpatient Medications  Medication Sig Dispense Refill  . apixaban (ELIQUIS) 5 MG TABS tablet Take 1 tablet (5 mg total) by mouth 2 (two) times daily. 180 tablet 3  . atorvastatin (LIPITOR) 10 MG tablet Take 10 mg by mouth daily.    . B-D ULTRAFINE III SHORT PEN 31G X 8 MM MISC Use twice a daily to inject insulin 100 each 5  . B-D ULTRAFINE III SHORT PEN 31G X 8 MM MISC USE FOR INSULIN INJECTIONS TWICE DAILY 100 each 2  . famotidine (PEPCID) 20 MG tablet TAKE 1 TABLET(20 MG) BY MOUTH TWICE DAILY 60 tablet 5  . furosemide (LASIX) 40 MG tablet Take 1 tablet (40 mg total) by mouth daily. 30 tablet 6  . glucose blood (CONTOUR NEXT TEST) test strip USE TO TEST BLOOD SUGAR TWICE DAILY AS DIRECTED 100 each 3  . Insulin Glargine (BASAGLAR KWIKPEN) 100 UNIT/ML SOPN INJECT 60 UNITS UNDER THE SKIN INTO THE SKIN AT BEDTIME 15 mL 2  . metFORMIN (GLUCOPHAGE) 500 MG tablet Take 1 tablet (500 mg total) by mouth 2 (two) times daily with a meal. Call office to schedule appointment 180 tablet 0  . metoprolol succinate (TOPROL-XL) 100 MG 24 hr tablet Take 1 tablet (100 mg total) by mouth daily. Take with or immediately following a meal. 90 tablet 1  . NORLYDA 0.35 MG tablet Take 1 tablet by mouth daily.   12  . sacubitril-valsartan (ENTRESTO) 24-26 MG Take 1 tablet by mouth twice daily. Please Honor Card  patient is presenting for Carmie Kanner: [id number]; Juanna Cao: [phone]; GGYIR: [id number]; ISSUER: [id number] ID: Pharmacy to complete 60 tablet 3   No current facility-administered medications for this visit.      Past Medical History:  Diagnosis Date  . Arrhythmia   . CAD (coronary artery disease)    a. 03/2018: cath showing 60% mid-LAD stenosis not significant by FFR.   Marland Kitchen Chronic combined systolic (congestive) and diastolic (congestive) heart failure ([place])    a. 03/2018: found to have a newly reduced EF of 20% by echocardiogram  . Depression   . Diabetes mellitus   . Hypertension   . Microproteinuria     ROS:   All systems reviewed and negative except as noted in the HPI.   Past Surgical History:  Procedure Laterality Date  . BACK SURGERY    . BREAST BIOPSY Left 2017   benign  . INTRAVASCULAR PRESSURE WIRE/FFR STUDY N/A 03/24/2018   Procedure: INTRAVASCULAR PRESSURE WIRE/FFR STUDY;  Surgeon: Leonie Man, MD;  Location: [place] [facility];  Service: Cardiovascular;  Laterality: N/A;  . NOSE SURGERY    . RIGHT/LEFT HEART CATH AND CORONARY ANGIOGRAPHY N/A 03/24/2018   Procedure: RIGHT/LEFT HEART CATH AND CORONARY ANGIOGRAPHY;  Surgeon: Leonie Man, MD;  Location: [place] [facility];  Service: Cardiovascular;  Laterality: N/A;  . TONSILLECTOMY    . WISDOM  TOOTH EXTRACTION       Family History  Problem Relation Age of Onset  . Stroke Sister 26       smoker, diabetic, HTN  . Breast cancer Sister   . Cancer Mother        breast  . Breast cancer Mother   . Hypertension Mother   . Diabetes Other   . Heart attack Other   . Chronic Renal Failure Father      Social History   Socioeconomic History  . Marital status: Married    Spouse name: Not on file  . Number of children: Not on file  . Years of education: Not on file  . Highest education level: Not on file  Occupational History  . Not on file  Social Needs  . Financial resource strain: Not on file  . Food  insecurity    Worry: Not on file    Inability: Not on file  . Transportation needs    Medical: Not on file    Non-medical: Not on file  Tobacco Use  . Smoking status: Never Smoker  . Smokeless tobacco: Never Used  Substance and Sexual Activity  . Alcohol use: No  . Drug use: No  . Sexual activity: Yes    Birth control/protection: Pill  Lifestyle  . Physical activity    Days per week: Not on file    Minutes per session: Not on file  . Stress: Not on file  Relationships  . Social Herbalist on phone: Not on file    Gets together: Not on file    Attends religious service: Not on file    Active member of club or organization: Not on file    Attends meetings of clubs or organizations: Not on file    Relationship status: Not on file  . Intimate partner violence    Fear of current or ex partner: Not on file    Emotionally abused: Not on file    Physically abused: Not on file    Forced sexual activity: Not on file  Other Topics Concern  . Not on file  Social History Narrative  . Not on file     BP 124/76 (BP Location: Left Arm)   Pulse 68   Temp 98.2 F (36.8 C)   Ht 5' 7.5" (1.715 m)   Wt 195 lb (88.5 kg)   SpO2 98%   BMI 30.09 kg/m   Physical Exam:  Well appearing 48 year old woman, NAD HEENT: Unremarkable Neck: 7 cm JVD, no thyromegally Lymphatics:  No adenopathy Back:  No CVA tenderness Lungs:  Clear, with no wheezes, rales, or rhonchi HEART:  Regular rate rhythm, no murmurs, no rubs, no clicks Abd:  soft, positive bowel sounds, no organomegally, no rebound, no guarding Ext:  2 plus pulses, no edema, no cyanosis, no clubbing Skin:  No rashes no nodules Neuro:  CN II through XII intact, motor grossly intact  EKG -reviewed, normal sinus rhythm with a narrow QRS   Assess/Plan: 1.  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  I discussed the  risks, goals, benefits, and expectations of ICD insertion.  Additional discussion about subcutaneous ICD versus standard ICD was also discussed with the patient.  She would like to proceed with standard ICD.  This will be scheduled in the next few weeks.  She will continue with her LifeVest for now.  Mikle Bosworth.D.

## 2018-10-15 NOTE — H&P (View-Only) (Signed)
HPI [name] returns today for ongoing evaluation and management of her chronic systolic heart failure and consideration for ICD insertion.  She is a 48 year old woman with a nonischemic cardiomyopathy, chronic systolic heart failure, who is been on maximal medical therapy.  I discussed ICD insertion with the patient several weeks ago and she preferred to have an additional face-to-face discussion.  The patient has not had syncope.  She is wearing a LifeVest.  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  Allergies  Allergen Reactions  . Aspartame And Phenylalanine      Current Outpatient Medications  Medication Sig Dispense Refill  . apixaban (ELIQUIS) 5 MG TABS tablet Take 1 tablet (5 mg total) by mouth 2 (two) times daily. 180 tablet 3  . atorvastatin (LIPITOR) 10 MG tablet Take 10 mg by mouth daily.    . B-D ULTRAFINE III SHORT PEN 31G X 8 MM MISC Use twice a daily to inject insulin 100 each 5  . B-D ULTRAFINE III SHORT PEN 31G X 8 MM MISC USE FOR INSULIN INJECTIONS TWICE DAILY 100 each 2  . famotidine (PEPCID) 20 MG tablet TAKE 1 TABLET(20 MG) BY MOUTH TWICE DAILY 60 tablet 5  . furosemide (LASIX) 40 MG tablet Take 1 tablet (40 mg total) by mouth daily. 30 tablet 6  . glucose blood (CONTOUR NEXT TEST) test strip USE TO TEST BLOOD SUGAR TWICE DAILY AS DIRECTED 100 each 3  . Insulin Glargine (BASAGLAR KWIKPEN) 100 UNIT/ML SOPN INJECT 60 UNITS UNDER THE SKIN INTO THE SKIN AT BEDTIME 15 mL 2  . metFORMIN (GLUCOPHAGE) 500 MG tablet Take 1 tablet (500 mg total) by mouth 2 (two) times daily with a meal. Call office to schedule appointment 180 tablet 0  . metoprolol succinate (TOPROL-XL) 100 MG 24 hr tablet Take 1 tablet (100 mg total) by mouth daily. Take with or immediately following a meal. 90 tablet 1  . NORLYDA 0.35 MG tablet Take 1 tablet by mouth daily.   12  . sacubitril-valsartan (ENTRESTO) 24-26 MG Take 1 tablet by mouth twice daily. Please Honor Card  patient is presenting for Tara Bryant: [id number]; Tara Bryant: [phone]; PYPPJ: [id number]; ISSUER: [id number] ID: Pharmacy to complete 60 tablet 3   No current facility-administered medications for this visit.      Past Medical History:  Diagnosis Date  . Arrhythmia   . CAD (coronary artery disease)    a. 03/2018: cath showing 60% mid-LAD stenosis not significant by FFR.   Marland Kitchen Chronic combined systolic (congestive) and diastolic (congestive) heart failure ([place])    a. 03/2018: found to have a newly reduced EF of 20% by echocardiogram  . Depression   . Diabetes mellitus   . Hypertension   . Microproteinuria     ROS:   All systems reviewed and negative except as noted in the HPI.   Past Surgical History:  Procedure Laterality Date  . BACK SURGERY    . BREAST BIOPSY Left 2017   benign  . INTRAVASCULAR PRESSURE WIRE/FFR STUDY N/A 03/24/2018   Procedure: INTRAVASCULAR PRESSURE WIRE/FFR STUDY;  Surgeon: Leonie Man, MD;  Location: [place] [facility];  Service: Cardiovascular;  Laterality: N/A;  . NOSE SURGERY    . RIGHT/LEFT HEART CATH AND CORONARY ANGIOGRAPHY N/A 03/24/2018   Procedure: RIGHT/LEFT HEART CATH AND CORONARY ANGIOGRAPHY;  Surgeon: Leonie Man, MD;  Location: [place] [facility];  Service: Cardiovascular;  Laterality: N/A;  . TONSILLECTOMY    . WISDOM  TOOTH EXTRACTION       Family History  Problem Relation Age of Onset  . Stroke Sister 17       smoker, diabetic, HTN  . Breast cancer Sister   . Cancer Mother        breast  . Breast cancer Mother   . Hypertension Mother   . Diabetes Other   . Heart attack Other   . Chronic Renal Failure Father      Social History   Socioeconomic History  . Marital status: Married    Spouse name: Not on file  . Number of children: Not on file  . Years of education: Not on file  . Highest education level: Not on file  Occupational History  . Not on file  Social Needs  . Financial resource strain: Not on file  . Food  insecurity    Worry: Not on file    Inability: Not on file  . Transportation needs    Medical: Not on file    Non-medical: Not on file  Tobacco Use  . Smoking status: Never Smoker  . Smokeless tobacco: Never Used  Substance and Sexual Activity  . Alcohol use: No  . Drug use: No  . Sexual activity: Yes    Birth control/protection: Pill  Lifestyle  . Physical activity    Days per week: Not on file    Minutes per session: Not on file  . Stress: Not on file  Relationships  . Social Herbalist on phone: Not on file    Gets together: Not on file    Attends religious service: Not on file    Active member of club or organization: Not on file    Attends meetings of clubs or organizations: Not on file    Relationship status: Not on file  . Intimate partner violence    Fear of current or ex partner: Not on file    Emotionally abused: Not on file    Physically abused: Not on file    Forced sexual activity: Not on file  Other Topics Concern  . Not on file  Social History Narrative  . Not on file     BP 124/76 (BP Location: Left Arm)   Pulse 68   Temp 98.2 F (36.8 C)   Ht 5' 7.5" (1.715 m)   Wt 195 lb (88.5 kg)   SpO2 98%   BMI 30.09 kg/m   Physical Exam:  Well appearing 48 year old woman, NAD HEENT: Unremarkable Neck: 7 cm JVD, no thyromegally Lymphatics:  No adenopathy Back:  No CVA tenderness Lungs:  Clear, with no wheezes, rales, or rhonchi HEART:  Regular rate rhythm, no murmurs, no rubs, no clicks Abd:  soft, positive bowel sounds, no organomegally, no rebound, no guarding Ext:  2 plus pulses, no edema, no cyanosis, no clubbing Skin:  No rashes no nodules Neuro:  CN II through XII intact, motor grossly intact  EKG -reviewed, normal sinus rhythm with a narrow QRS   Assess/Plan: 1.  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  I discussed the  risks, goals, benefits, and expectations of ICD insertion.  Additional discussion about subcutaneous ICD versus standard ICD was also discussed with the patient.  She would like to proceed with standard ICD.  This will be scheduled in the next few weeks.  She will continue with her LifeVest for now.  Mikle Bosworth.D.

## 2018-10-19 ENCOUNTER — Telehealth: Payer: Self-pay | Admitting: *Deleted

## 2018-10-19 ENCOUNTER — Ambulatory Visit (INDEPENDENT_AMBULATORY_CARE_PROVIDER_SITE_OTHER): Payer: PRIVATE HEALTH INSURANCE | Admitting: *Deleted

## 2018-10-19 ENCOUNTER — Encounter: Payer: Self-pay | Admitting: Cardiology

## 2018-10-19 ENCOUNTER — Telehealth (INDEPENDENT_AMBULATORY_CARE_PROVIDER_SITE_OTHER): Payer: PRIVATE HEALTH INSURANCE | Admitting: Cardiology

## 2018-10-19 ENCOUNTER — Other Ambulatory Visit: Payer: Self-pay

## 2018-10-19 VITALS — BP 118/74 | HR 119 | Ht 67.5 in | Wt 189.0 lb

## 2018-10-19 VITALS — BP 135/81 | HR 114 | Ht 67.5 in | Wt 193.0 lb

## 2018-10-19 DIAGNOSIS — I10 Essential (primary) hypertension: Secondary | ICD-10-CM | POA: Diagnosis not present

## 2018-10-19 DIAGNOSIS — I5042 Chronic combined systolic (congestive) and diastolic (congestive) heart failure: Secondary | ICD-10-CM

## 2018-10-19 DIAGNOSIS — I429 Cardiomyopathy, unspecified: Secondary | ICD-10-CM | POA: Diagnosis not present

## 2018-10-19 DIAGNOSIS — I5022 Chronic systolic (congestive) heart failure: Secondary | ICD-10-CM | POA: Diagnosis not present

## 2018-10-19 MED ORDER — SACUBITRIL-VALSARTAN 49-51 MG PO TABS: 1.0000 | ORAL_TABLET | Freq: Two times a day (BID) | ORAL | 6 refills | Status: DC

## 2018-10-19 NOTE — Progress Notes (Signed)
Present to office for nurse visit to have vitals and ekg per tele-health visit today. Medications reviewed. EKG and vitals sent to Dr. Harl Bowie for review.

## 2018-10-19 NOTE — Progress Notes (Signed)
Virtual Visit via Telephone Note   This visit type was conducted due to national recommendations for restrictions regarding the COVID-19 Pandemic (e.g. social distancing) in an effort to limit this patient's exposure and mitigate transmission in our community.  Due to her co-morbid illnesses, this patient is at least at moderate risk for complications without adequate follow up.  This format is felt to be most appropriate for this patient at this time.  The patient did not have access to video technology/had technical difficulties with video requiring transitioning to audio format only (telephone).  All issues noted in this document were discussed and addressed.  No physical exam could be performed with this format.  Please refer to the patient's chart for her  consent to telehealth for [facility].   Date:  10/19/2018   ID:  Tara Bryant, DOB November 23, 1970, MRN [id number]  Patient Location: Home Provider Location: Office  PCP:  Mikey Kirschner, MD  Cardiologist:  Carlyle Dolly, MD  Electrophysiologist:  None   Evaluation Performed:  Follow-Up Visit  Chief Complaint:  Follow up  History of Present Illness:    Tara Bryant is a 48 y.o. female seen today for follow up of the following medical problems.     1.Chronicsystolic HF/NICM/noncompaction CM - 03/2018 echo LVEF 20% - EKG SR, nonspecific ST/T chagnes - normal TSH -03/2018 LHC/RHC: moderate nonobstrcutive CAD, mean PA 45, PCWP 31, CI 2.2 - 03/2018 cardiac MRI consistent with biventricular noncompaction CM, moderate RV dysfunction - setting of low LVEF and noncompaction she was started on eliquis during prior admission at [facility] (due to thrombus risk, no thrombus noted on MRI) and life vest placed - her 48 year old was screened for noncompaction by pediatric cards at [facility], workup was negative.    - 08/2018 echo: LVEF 25-30%, grade II diastolic dysfunction, mild RV dysfunction - no recent SOB or DOE -  waiting on insurance approval on entresto, still has samples at home she is taking.  - tolerating high dose of toprol without troubles  - discussed with Dr Lovena Le, wished to think over ICD at this time. Has f/u July 9. Still wearing lifevest - has not heard from CHF clinic about referal   - last visit we increased toprol to 100mg  daily. Has been on entresto samples, we are awaiting approval from her insurance   2. Pulmonary hypertension - type II, her mean PA was 45 and PCWP 41 in setting of severel volume overload. Moderate RV dysfunction by MRI  3. 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    Past Medical History:  Diagnosis Date  . Arrhythmia   . CAD (coronary artery disease)    a. 03/2018: cath showing 60% mid-LAD stenosis not significant by FFR.   Marland Kitchen Chronic combined systolic (congestive) and diastolic (congestive) heart failure ([place])    a. 03/2018: found to have a newly reduced EF of 20% by echocardiogram  . Depression   . Diabetes mellitus   . Hypertension   . Microproteinuria    Past Surgical History:  Procedure Laterality Date  . BACK SURGERY    . BREAST BIOPSY Left 2017   benign  . INTRAVASCULAR PRESSURE WIRE/FFR STUDY N/A 03/24/2018   Procedure: INTRAVASCULAR PRESSURE WIRE/FFR STUDY;  Surgeon: Leonie Man, MD;  Location: [place] [facility];  Service: Cardiovascular;  Laterality: N/A;  . NOSE SURGERY    . RIGHT/LEFT HEART CATH AND CORONARY ANGIOGRAPHY N/A 03/24/2018   Procedure: RIGHT/LEFT HEART CATH AND CORONARY ANGIOGRAPHY;  Surgeon: Leonie Man, MD;  Location: [place] [facility];  Service: Cardiovascular;  Laterality: N/A;  . TONSILLECTOMY    . WISDOM TOOTH EXTRACTION       Current Meds  Medication Sig  . apixaban (ELIQUIS) 5 MG TABS tablet Take 1  tablet (5 mg total) by mouth 2 (two) times daily.  Marland Kitchen atorvastatin (LIPITOR) 10 MG tablet Take 10 mg by mouth daily.  . B-D ULTRAFINE III SHORT PEN 31G X 8 MM MISC Use twice a daily to inject insulin  . B-D ULTRAFINE III SHORT PEN 31G X 8 MM MISC USE FOR INSULIN INJECTIONS TWICE DAILY  . famotidine (PEPCID) 20 MG tablet TAKE 1 TABLET(20 MG) BY MOUTH TWICE DAILY  . furosemide (LASIX) 40 MG tablet Take 1 tablet (40 mg total) by mouth daily.  Marland Kitchen glucose blood (CONTOUR NEXT TEST) test strip USE TO TEST BLOOD SUGAR TWICE DAILY AS DIRECTED  . Insulin Glargine (BASAGLAR KWIKPEN) 100 UNIT/ML SOPN INJECT 60 UNITS UNDER THE SKIN INTO THE SKIN AT BEDTIME  . metFORMIN (GLUCOPHAGE) 500 MG tablet Take 1 tablet (500 mg total) by mouth 2 (two) times daily with a meal. Call office to schedule appointment  . metoprolol succinate (TOPROL-XL) 100 MG 24 hr tablet Take 1 tablet (100 mg total) by mouth daily. Take with or immediately following a meal.  . NORLYDA 0.35 MG tablet Take 1 tablet by mouth daily.   . sacubitril-valsartan (ENTRESTO) 24-26 MG Take 1 tablet by mouth twice daily. Please Honor Card patient is presenting for Carmie Kanner: [id number]; Juanna Cao: [phone]; DZHGD: [id number]; ISSUER: [id number] ID: Pharmacy to complete     Allergies:   Aspartame and phenylalanine   Social History   Tobacco Use  . Smoking status: Never Smoker  . Smokeless tobacco: Never Used  Substance Use Topics  . Alcohol use: No  . Drug use: No     Family Hx: The patient's family history includes Breast cancer in her mother and sister; Cancer in her mother; Chronic Renal Failure in her father; Diabetes in an other family member; Heart attack in an other family member; Hypertension in her mother; Stroke (age of onset: 43) in her sister.  ROS:   Please see the history of present illness.     All other systems reviewed and are negative.   Prior CV studies:   The following studies were reviewed today:  03/2018 echo LVEF 20%, diffuse  hypokinesis, cannot eval diastolic function, mild RV dysfunction, PASP 47, small pericardial effusion  03/2018 LHC/RHC  Prox LAD lesion is 55% stenosed. DFR 0.92  Otherwise angiographically normal coronary arteries.  Hemodynamic findings consistent with severe pulmonary hypertension.  LV end diastolic pressure is severely elevated.  SUMMARY:  Moderate single-vessel disease with roughly 60% mid LAD (DFR 0.92 -not physiologically significant)  Otherwise minimal CAD.  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 (Output 4.48, Index 2.2.)   03/2018 cardiac MRI FINDINGS: 1. Moderately dilated left ventricle with mild concentric hypertrophy and severe systolic function (LVEF = 27%). Severe diffuse hypokinesis and paradoxical septal motion. There is no late gadolinium enhancement in the left ventricular myocardium. There is significant hyper trabeculation of the left  ventricle with ratio of non-compacted to compacted myocardium > 4:1.  LVEDD: 61 mm  LVESD: 51 mm  LVEDV: 170 ml  LVESV: 124 ml  SV: 46 ml  CO: 4.6 L/min  Myocardial mass: 166 g  2. Normal right ventricular size, thickness and moderate systolic dysfunction (LVEF = 30%). There are no regional wall motion abnormalities.  3. Normal left and right atrial size.  4. Normal size of the aortic root, ascending aorta and pulmonary artery.  5. Trivial mitral and mild tricuspid regurgitation.  6. Normal pericardium. Mild circumferential pericardial effusion, moderate amount inferior to the heart (up to 11 mm).  IMPRESSION: 1. Moderately dilated left ventricle with mild concentric hypertrophy and severe systolic function (LVEF = 27%). Severe diffuse hypokinesis and paradoxical septal motion. There is no late gadolinium  enhancement in the left ventricular myocardium. There is significant hyper trabeculation of the left ventricle with ratio of non-compacted to compacted myocardium > 4:1.  2. Normal right ventricular size, thickness and moderate systolic dysfunction (LVEF = 30%). There are no regional wall motion abnormalities.  3. Normal left and right atrial size.  4. Normal size of the aortic root, ascending aorta and pulmonary artery.  5. Trivial mitral and mild tricuspid regurgitation.  6. Normal pericardium. Mild circumferential pericardial effusion, moderate amount inferior to the heart (up to 11 mm).  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   08/2018 echo IMPRESSIONS   1. The left ventricle has severely reduced systolic function, with an ejection fraction of 25-30%. The cavity size was normal. There is mild concentric left ventricular hypertrophy. Left ventricular diastolic Doppler parameters are consistent with  pseudonormalization. Elevated left ventricular end-diastolic pressure Left ventricular diffuse hypokinesis. 2. The left ventricular apex appears trabeculated. 3. The right ventricle has mildly reduced systolic function. The cavity was normal. There is mildly increased right ventricular wall thickness. 4. The mitral valve is grossly normal. Mild thickening of the mitral valve leaflet. Mitral valve regurgitation is mild to moderate by color flow Doppler. 5. The tricuspid valve is grossly normal. 6. The aortic valve is grossly normal. Mild thickening of the aortic valve. 7. The aortic root is normal in size and structure.  Labs/Other Tests and Data Reviewed:    EKG:  No ECG reviewed.  Recent Labs: 03/17/2018: BNP 218.4 03/23/2018: Magnesium 1.7 04/03/2018: Hemoglobin 13.8; Platelets 304 09/29/2018: ALT 15; BUN 16; Creatinine, Ser 0.73; Potassium 4.3; Sodium 138; TSH 1.860   Recent  Lipid Panel Lab Results  Component Value Date/Time   CHOL 158 09/29/2018 12:06 PM   CHOL 202 (H) 03/26/2017 09:16 AM   TRIG 109 09/29/2018 12:06 PM   HDL 36 (L) 09/29/2018 12:06 PM   HDL 45 03/26/2017 09:16 AM   CHOLHDL 4.4 09/29/2018 12:06 PM   LDLCALC 100 (H) 09/29/2018 12:06 PM   LDLCALC 134 (H) 03/26/2017 09:16 AM    Wt Readings from Last 3 Encounters:  10/19/18 189 lb (85.7 kg)  10/15/18 195 lb (88.5 kg)  09/30/18 189 lb (85.7 kg)     Objective:    Vital Signs:  BP 118/74   Pulse (!) 119   Ht 5' 7.5" (1.715 m)   Wt 189 lb (85.7 kg)   BMI 29.16 kg/m    Today's Vitals   10/19/18 0955  BP: 118/74  Pulse: (!) 119  Weight: 189 lb (85.7 kg)  Height: 5' 7.5" (1.715 m)   Body mass index is 29.16 kg/m.   ASSESSMENT & PLAN:  1.Chronic systolic HF/Noncompaction cardiomyopathy - NICM, noncompaction CM by MRI - medical therapy previously limited by low bp's, her bps have trended upward and we have been titrating up her meds.  - has been on anticoag in setting of noncompaction and low LVEF, continue at this time.On eliquis, some cost issues but continuing. If neccesary can change to coumadin - has lifevest, being considered for ICD by EP  - increase entresto to 49/51mg  bid - recheck labs   2. Tachycardia - she will come in for a nursing visit for EKG. We have seen sinus tach previously for her on early on during her HF diagnosis. She denies any acute systemic symptoms     COVID-19 Education: The signs and symptoms of COVID-19 were discussed with the patient and how to seek care for testing (follow up with PCP or arrange E-visit).  The importance of social distancing was discussed today.  Time:   Today, I have spent 20 minutes with the patient with telehealth technology discussing the above problems.     Medication Adjustments/Labs and Tests Ordered: Current medicines are reviewed at length with the patient today.  Concerns regarding medicines are outlined  above.   Tests Ordered: No orders of the defined types were placed in this encounter.   Medication Changes: No orders of the defined types were placed in this encounter.   Follow Up:  In Person 3 weeks  Signed, Carlyle Dolly, MD  10/19/2018 10:26 AM    [place]

## 2018-10-19 NOTE — Telephone Encounter (Signed)
Notification received from [facility] [place] that entresto 49/51 mg BID approved for quantity of 60 per 30 days for 365 days.

## 2018-10-19 NOTE — Patient Instructions (Signed)
Medication Instructions:   Increase Entresto to 49/51mg  twice a day.    Continue all other medications.    Labwork:  CMET, CBC, TSH, BNP, Magnesium - orders given today.  Office will contact with results via phone or letter.    Testing/Procedures: none  Follow-Up: 3 weeks   Any Other Special Instructions Will Be Listed Below (If Applicable).  If you need a refill on your cardiac medications before your next appointment, please call your pharmacy.

## 2018-10-20 ENCOUNTER — Other Ambulatory Visit (HOSPITAL_COMMUNITY)
Admission: RE | Admit: 2018-10-20 | Discharge: 2018-10-20 | Disposition: A | Discharge status: Home / Self Care | Payer: PRIVATE HEALTH INSURANCE | Source: Ambulatory Visit | Attending: Cardiology | Admitting: Cardiology

## 2018-10-20 ENCOUNTER — Other Ambulatory Visit: Payer: Self-pay

## 2018-10-20 ENCOUNTER — Telehealth: Payer: Self-pay | Admitting: Cardiology

## 2018-10-20 DIAGNOSIS — I5042 Chronic combined systolic (congestive) and diastolic (congestive) heart failure: Secondary | ICD-10-CM | POA: Insufficient documentation

## 2018-10-20 DIAGNOSIS — I429 Cardiomyopathy, unspecified: Secondary | ICD-10-CM | POA: Diagnosis present

## 2018-10-20 DIAGNOSIS — I10 Essential (primary) hypertension: Secondary | ICD-10-CM | POA: Insufficient documentation

## 2018-10-20 LAB — COMPREHENSIVE METABOLIC PANEL
ALT: 21 U/L (ref 0–44)
AST: 17 U/L (ref 15–41)
Albumin: 3.9 g/dL (ref 3.5–5.0)
Alkaline Phosphatase: 81 U/L (ref 38–126)
Anion gap: 10 (ref 5–15)
BUN: 18 mg/dL (ref 6–20)
CO2: 26 mmol/L (ref 22–32)
Calcium: 9.2 mg/dL (ref 8.9–10.3)
Chloride: 99 mmol/L (ref 98–111)
Creatinine, Ser: 0.84 mg/dL (ref 0.44–1.00)
GFR calc Af Amer: >60 mL/min (ref >60)
GFR calc non Af Amer: >60 mL/min (ref >60)
Glucose, Bld: 278 mg/dL — ABNORMAL HIGH (ref 70–99)
Potassium: 4.5 mmol/L (ref 3.5–5.1)
Sodium: 135 mmol/L (ref 135–145)
Total Bilirubin: 0.5 mg/dL (ref 0.3–1.2)
Total Protein: 7.4 g/dL (ref 6.5–8.1)

## 2018-10-20 LAB — CBC
HCT: 40.6 % (ref 36.0–46.0)
Hemoglobin: 13.7 g/dL (ref 12.0–15.0)
MCH: 31.1 pg (ref 26.0–34.0)
MCHC: 33.7 g/dL (ref 30.0–36.0)
MCV: 92.3 fL (ref 80.0–100.0)
Platelets: 280 10*3/uL (ref 150–400)
RBC: 4.4 MIL/uL (ref 3.87–5.11)
RDW: 12.7 % (ref 11.5–15.5)
WBC: 8.6 10*3/uL (ref 4.0–10.5)
nRBC: 0 % (ref 0.0–0.2)

## 2018-10-20 LAB — BRAIN NATRIURETIC PEPTIDE: B Natriuretic Peptide: 31 pg/mL (ref 0.0–100.0)

## 2018-10-20 LAB — TSH: TSH: 1.792 u[IU]/mL (ref 0.350–4.500)

## 2018-10-20 LAB — MAGNESIUM: Magnesium: 1.6 mg/dL — ABNORMAL LOW (ref 1.7–2.4)

## 2018-10-20 NOTE — Telephone Encounter (Signed)
Patient informed that lab work is non-fasting.

## 2018-10-20 NOTE — Telephone Encounter (Signed)
Would like to know if lab work needs to be fasting

## 2018-10-21 ENCOUNTER — Encounter: Payer: Self-pay | Admitting: *Deleted

## 2018-10-23 ENCOUNTER — Telehealth: Payer: Self-pay | Admitting: *Deleted

## 2018-10-23 ENCOUNTER — Telehealth: Payer: PRIVATE HEALTH INSURANCE | Admitting: Student

## 2018-10-23 ENCOUNTER — Telehealth: Payer: PRIVATE HEALTH INSURANCE | Admitting: Cardiology

## 2018-10-23 NOTE — Telephone Encounter (Signed)
Pt aware - routed to pcp  

## 2018-10-23 NOTE — Telephone Encounter (Signed)
-----   Message from Arnoldo Lenis, MD sent at 10/21/2018 12:49 PM EDT ----- He labs all look good. Overall nothing that would cause elevated heart rates. Perhaps just related to significnat stress, just something we will monitor at this time. If any new symptoms have her contact us back  Tara Abts MD

## 2018-10-26 ENCOUNTER — Telehealth: Payer: Self-pay | Admitting: "Endocrinology

## 2018-10-26 DIAGNOSIS — E1165 Type 2 diabetes mellitus with hyperglycemia: Secondary | ICD-10-CM

## 2018-10-26 NOTE — Telephone Encounter (Signed)
PATIENT WOULD LIKE A REFILL ON Insulin Glargine (BASAGLAR KWIKPEN) 100 UNIT/ML SOPN

## 2018-10-27 ENCOUNTER — Other Ambulatory Visit (HOSPITAL_COMMUNITY)
Admission: RE | Admit: 2018-10-27 | Discharge: 2018-10-27 | Disposition: A | Discharge status: Home / Self Care | Payer: PRIVATE HEALTH INSURANCE | Source: Ambulatory Visit | Attending: Internal Medicine | Admitting: Internal Medicine

## 2018-10-27 ENCOUNTER — Other Ambulatory Visit: Payer: Self-pay

## 2018-10-27 ENCOUNTER — Telehealth: Payer: Self-pay | Admitting: Cardiology

## 2018-10-27 DIAGNOSIS — Z1159 Encounter for screening for other viral diseases: Secondary | ICD-10-CM | POA: Diagnosis present

## 2018-10-27 LAB — SARS CORONAVIRUS 2 (TAT 6-24 HRS): SARS Coronavirus 2: NEGATIVE

## 2018-10-27 MED ORDER — BASAGLAR KWIKPEN 100 UNIT/ML ~~LOC~~ SOPN: PEN_INJECTOR | SUBCUTANEOUS | 2 refills | Status: DC

## 2018-10-27 NOTE — Telephone Encounter (Signed)
Rx sent 

## 2018-10-27 NOTE — Telephone Encounter (Signed)
New message    Patient has a question on the procedure she is having done on Thursday 05/31/18 she would like Kisha to give her a call back

## 2018-10-27 NOTE — Telephone Encounter (Signed)
Spoke with pt. She would like to know if she should wear life vest on day of procedure. Instructed pt to do so.

## 2018-10-28 ENCOUNTER — Telehealth: Payer: Self-pay | Admitting: Cardiology

## 2018-10-28 NOTE — Telephone Encounter (Signed)
Pt calling to ask what time she must stop eating tonight and if she may brush her teeth in the morning. Insured pt that she may brush her teeth and she is to stop eating after midnight.

## 2018-10-28 NOTE — Telephone Encounter (Signed)
New message    Patient has more questions about her procedure for tomorrow would like Kisha to call her back

## 2018-10-29 ENCOUNTER — Other Ambulatory Visit: Payer: Self-pay

## 2018-10-29 ENCOUNTER — Ambulatory Visit (HOSPITAL_COMMUNITY): Payer: PRIVATE HEALTH INSURANCE

## 2018-10-29 ENCOUNTER — Ambulatory Visit (HOSPITAL_COMMUNITY)
Admission: RE | Disposition: A | Discharge status: Home / Self Care | Payer: PRIVATE HEALTH INSURANCE | Source: Home / Self Care | Attending: Internal Medicine

## 2018-10-29 ENCOUNTER — Ambulatory Visit (HOSPITAL_COMMUNITY)
Admission: RE | Admit: 2018-10-29 | Discharge: 2018-10-29 | Disposition: A | Discharge status: Home / Self Care | Payer: PRIVATE HEALTH INSURANCE | Attending: Internal Medicine | Admitting: Internal Medicine

## 2018-10-29 DIAGNOSIS — Z006 Encounter for examination for normal comparison and control in clinical research program: Secondary | ICD-10-CM | POA: Insufficient documentation

## 2018-10-29 DIAGNOSIS — I5042 Chronic combined systolic (congestive) and diastolic (congestive) heart failure: Secondary | ICD-10-CM | POA: Diagnosis not present

## 2018-10-29 DIAGNOSIS — Z823 Family history of stroke: Secondary | ICD-10-CM | POA: Diagnosis not present

## 2018-10-29 DIAGNOSIS — I255 Ischemic cardiomyopathy: Secondary | ICD-10-CM | POA: Diagnosis not present

## 2018-10-29 DIAGNOSIS — Z7901 Long term (current) use of anticoagulants: Secondary | ICD-10-CM | POA: Insufficient documentation

## 2018-10-29 DIAGNOSIS — Z95810 Presence of automatic (implantable) cardiac defibrillator: Secondary | ICD-10-CM

## 2018-10-29 DIAGNOSIS — Z794 Long term (current) use of insulin: Secondary | ICD-10-CM | POA: Insufficient documentation

## 2018-10-29 DIAGNOSIS — I110 Hypertensive heart disease with heart failure: Secondary | ICD-10-CM | POA: Insufficient documentation

## 2018-10-29 DIAGNOSIS — Z79899 Other long term (current) drug therapy: Secondary | ICD-10-CM | POA: Diagnosis not present

## 2018-10-29 DIAGNOSIS — Z8249 Family history of ischemic heart disease and other diseases of the circulatory system: Secondary | ICD-10-CM | POA: Insufficient documentation

## 2018-10-29 DIAGNOSIS — I420 Dilated cardiomyopathy: Secondary | ICD-10-CM | POA: Diagnosis present

## 2018-10-29 DIAGNOSIS — E119 Type 2 diabetes mellitus without complications: Secondary | ICD-10-CM | POA: Insufficient documentation

## 2018-10-29 DIAGNOSIS — Z888 Allergy status to other drugs, medicaments and biological substances status: Secondary | ICD-10-CM | POA: Diagnosis not present

## 2018-10-29 DIAGNOSIS — I2510 Atherosclerotic heart disease of native coronary artery without angina pectoris: Secondary | ICD-10-CM | POA: Insufficient documentation

## 2018-10-29 DIAGNOSIS — I428 Other cardiomyopathies: Secondary | ICD-10-CM | POA: Insufficient documentation

## 2018-10-29 HISTORY — PX: ICD IMPLANT: EP1208

## 2018-10-29 LAB — GLUCOSE, CAPILLARY: Glucose-Capillary: 239 mg/dL — ABNORMAL HIGH (ref 70–99)

## 2018-10-29 LAB — SURGICAL PCR SCREEN
MRSA, PCR: NEGATIVE
Staphylococcus aureus: NEGATIVE

## 2018-10-29 LAB — PREGNANCY, URINE: Preg Test, Ur: NEGATIVE

## 2018-10-29 SURGERY — ICD IMPLANT

## 2018-10-29 MED ORDER — ONDANSETRON HCL 4 MG/2ML IJ SOLN: 4.0000 mg | Freq: Four times a day (QID) | INTRAMUSCULAR | Status: DC | PRN

## 2018-10-29 MED ORDER — HEPARIN (PORCINE) IN NACL 2-0.9 UNITS/ML
INTRAMUSCULAR | Status: AC | PRN
  Administered 2018-10-29: 500 mL

## 2018-10-29 MED ORDER — FENTANYL CITRATE (PF) 100 MCG/2ML IJ SOLN
INTRAMUSCULAR | Status: DC | PRN
  Administered 2018-10-29: 12.5 ug via INTRAVENOUS
  Administered 2018-10-29: 25 ug via INTRAVENOUS

## 2018-10-29 MED ORDER — HEPARIN (PORCINE) IN NACL 1000-0.9 UT/500ML-% IV SOLN
INTRAVENOUS | Status: AC
  Filled 2018-10-29: qty 500

## 2018-10-29 MED ORDER — MIDAZOLAM HCL 5 MG/5ML IJ SOLN
INTRAMUSCULAR | Status: AC
  Filled 2018-10-29: qty 5

## 2018-10-29 MED ORDER — CEFAZOLIN SODIUM-DEXTROSE 2-4 GM/100ML-% IV SOLN
INTRAVENOUS | Status: AC
  Filled 2018-10-29: qty 100

## 2018-10-29 MED ORDER — SODIUM CHLORIDE 0.9 % IV SOLN
INTRAVENOUS | Status: AC
  Filled 2018-10-29: qty 2

## 2018-10-29 MED ORDER — LIDOCAINE HCL (PF) 1 % IJ SOLN
INTRAMUSCULAR | Status: DC | PRN
  Administered 2018-10-29: 50 mL

## 2018-10-29 MED ORDER — MUPIROCIN 2 % EX OINT
1.0000 "application " | TOPICAL_OINTMENT | Freq: Once | CUTANEOUS | Status: AC
  Administered 2018-10-29: 1 via TOPICAL
  Filled 2018-10-29: qty 22

## 2018-10-29 MED ORDER — CEFAZOLIN SODIUM-DEXTROSE 1-4 GM/50ML-% IV SOLN: 1.0000 g | Freq: Four times a day (QID) | INTRAVENOUS | Status: DC

## 2018-10-29 MED ORDER — CEFAZOLIN SODIUM-DEXTROSE 2-4 GM/100ML-% IV SOLN
2.0000 g | INTRAVENOUS | Status: AC
  Administered 2018-10-29: 2 g via INTRAVENOUS
  Filled 2018-10-29: qty 100

## 2018-10-29 MED ORDER — MIDAZOLAM HCL 5 MG/5ML IJ SOLN
INTRAMUSCULAR | Status: DC | PRN
  Administered 2018-10-29 (×3): 1 mg via INTRAVENOUS

## 2018-10-29 MED ORDER — SODIUM CHLORIDE 0.9 % IV SOLN
INTRAVENOUS | Status: DC
  Administered 2018-10-29: 10:00:00 via INTRAVENOUS

## 2018-10-29 MED ORDER — MUPIROCIN 2 % EX OINT
TOPICAL_OINTMENT | CUTANEOUS | Status: AC
  Administered 2018-10-29: 1 via TOPICAL
  Filled 2018-10-29: qty 22

## 2018-10-29 MED ORDER — CHLORHEXIDINE GLUCONATE 4 % EX LIQD
60.0000 mL | Freq: Once | CUTANEOUS | Status: DC
  Filled 2018-10-29: qty 60

## 2018-10-29 MED ORDER — ACETAMINOPHEN 325 MG PO TABS: 325.0000 mg | ORAL_TABLET | ORAL | Status: DC | PRN

## 2018-10-29 MED ORDER — SODIUM CHLORIDE 0.9 % IV SOLN
80.0000 mg | INTRAVENOUS | Status: AC
  Administered 2018-10-29: 80 mg
  Filled 2018-10-29: qty 2

## 2018-10-29 MED ORDER — LIDOCAINE HCL 1 % IJ SOLN
INTRAMUSCULAR | Status: AC
  Filled 2018-10-29: qty 60

## 2018-10-29 MED ORDER — FENTANYL CITRATE (PF) 100 MCG/2ML IJ SOLN
INTRAMUSCULAR | Status: AC
  Filled 2018-10-29: qty 2

## 2018-10-29 SURGICAL SUPPLY — 6 items
CABLE SURGICAL S-101-97-12 (CABLE) ×2 IMPLANT
ICD VIGILANT VR D232 (Pacemaker) ×1 IMPLANT
LEAD RELIANCE G DF4 0293 (Lead) ×1 IMPLANT
PAD PRO RADIOLUCENT 2001M-C (PAD) ×2 IMPLANT
SHEATH CLASSIC 9F (SHEATH) ×1 IMPLANT
TRAY PACEMAKER INSERTION (PACKS) ×2 IMPLANT

## 2018-10-29 NOTE — Interval H&P Note (Signed)
History and Physical Interval Note:  10/29/2018 9:24 AM  Tara Bryant  has presented today for surgery, with the diagnosis of Heart failure.  The various methods of treatment have been discussed with the patient and family. After consideration of risks, benefits and other options for treatment, the patient has consented to  Procedure(s): ICD IMPLANT (N/A) as a surgical intervention.  The patient's history has been reviewed, patient examined, no change in status, stable for surgery.  I have reviewed the patient's chart and labs.  Questions were answered to the patient's satisfaction.     Cristopher Peru

## 2018-10-29 NOTE — Discharge Instructions (Signed)
10/30/2018, PLEASE SEND A REMOTE DEVICE TRANSMISSION      Supplemental Discharge Instructions for  Pacemaker/Defibrillator Patients  Activity No heavy lifting or vigorous activity with your left/right arm for 6 to 8 weeks.  Do not raise your left/right arm above your head for one week.  Gradually raise your affected arm as drawn below.              11/02/2018                 11/03/2018               11/04/2018               11/05/2018 __  NO DRIVING for  1 week  ; you may begin driving on  4/46/2863  .  WOUND CARE - Keep the wound area clean and dry.  Do not get this area wet for one week. No showers for one week; you may shower on 11/04/3028    . - 10/30/2018, Remove the arm sling - 10/30/2018, remove outer plastic dressing, leave the paper tapes (ster strips) in place, do not remove them - The tape/steri-strips on your wound will fall off; do not pull them off.  No bandage is needed on the site.  DO  NOT apply any creams, oils, or ointments to the wound area. - If you notice any drainage or discharge from the wound, any swelling or bruising at the site, or you develop a fever > 101? F after you are discharged home, call the office at once.  Special Instructions - You are still able to use cellular telephones; use the ear opposite the side where you have your pacemaker/defibrillator.  Avoid carrying your cellular phone near your device. - When traveling through airports, show security personnel your identification card to avoid being screened in the metal detectors.  Ask the security personnel to use the hand wand. - Avoid arc welding equipment, MRI testing (magnetic resonance imaging), TENS units (transcutaneous nerve stimulators).  Call the office for questions about other devices. - Avoid electrical appliances that are in poor condition or are not properly grounded. - Microwave ovens are safe to be near or to operate.  Additional information for defibrillator patients should your device  go off: - If your device goes off ONCE and you feel fine afterward, notify the device clinic nurses. - If your device goes off ONCE and you do not feel well afterward, call 911. - If your device goes off TWICE, call 911. - If your device goes off THREE times in one day, call 911.  DO NOT DRIVE YOURSELF OR A FAMILY MEMBER WITH A DEFIBRILLATOR TO THE HOSPITAL--CALL 911.

## 2018-10-30 ENCOUNTER — Encounter (HOSPITAL_COMMUNITY): Payer: Self-pay | Admitting: Internal Medicine

## 2018-10-30 ENCOUNTER — Telehealth: Payer: Self-pay | Admitting: Cardiology

## 2018-10-30 MED FILL — Lidocaine HCl Local Inj 1%: INTRAMUSCULAR | Qty: 60 | Status: AC

## 2018-10-30 NOTE — Telephone Encounter (Signed)
Pt agreed to come into the office Monday at 3 pm with the device clinic. I scheduled the appointment for that day.

## 2018-10-30 NOTE — Telephone Encounter (Signed)
-----   Message from [facility] [place] sent at 10/29/2018  1:13 PM EDT ----- Same day discharge post implant  Please follow up on transmission tomorrow  THANKS renee

## 2018-10-30 NOTE — Telephone Encounter (Signed)
Spoke w/ pt and requested that she send a manual transmission w/ her home monitor. She stated that she has not hooked the monitor yet. She stated that she was told to wait until today to hook the monitor up due to an upgrade. She stated that she will send the transmission in an hour or so. She is aware that if she has any problems to call the office back and we will help her send the transmission.

## 2018-10-30 NOTE — Telephone Encounter (Signed)
Pt sent her first initial transmission with her home monitor. The pt states there is some swelling on her incision site and notice a small amount of blood on her steri strips. The pt also states that her arm was hurting some this morning. She would like for the nurse to call her back with the results of her initial transmission and to answer her questions she has about her incision site. If someone could call her today she will appreciate it.

## 2018-10-30 NOTE — Telephone Encounter (Signed)
Spoke with patient. She reports her left arm, hand looked swollen this morning, arm is very sore. Chest/incisional area may also be swollen. She has not resumed yet Eliquis since implant, has not tried acetaminophen as instructed at d/c by Dr. Lovena Le (per pt). Small amount of dried blood outside of steri-strips this AM, no active bleeding noted since then.  Recommendations from Dr. Lovena Le are as follows: encouraged pt to remove sling, elevate arm to heart level at rest. Reviewed movement restrictions with pt and reminded her to avoid raising her arm above heart level until cleared. Encouraged pt to continue monitoring for signs of active bleeding, advised to call office to speak with on-call provider if any issues over the weekend. Continue to hold Eliquis until instructed otherwise per Dr. Lovena Le. Plan for wound check on Monday, 11/02/18. Okay to resume Eliquis after this appointment if no hematoma is noted. Pt verbalizes agreement with plan and will call back to schedule DC appointment (needs to check with her ride).  Manual transmission reviewed. Presenting rhythm Vs @ 126bpm. No lead test data available yet--clarified with [employer] rep/tech services that lead test results may take an additional 24hrs to initially result. Pt reports she has been very busy today and has had to make a lot of phone calls. No reported cardiac symptoms at this time. Requested that pt send an additional manual transmission tomorrow for review on Monday to confirm appropriate function per recommendation from industry rep and Dr. Lovena Le. Pt aware.       COVID-19 Pre-Screening Questions:  . In the past 7 to 10 days have you had a cough,  shortness of breath, headache, congestion, fever (100 or greater) body aches, chills, sore throat, or sudden loss of taste or sense of smell? . Have you been around anyone with known Covid 19. . Have you been around anyone who is awaiting Covid 19 test results in the past 7 to 10  days? . Have you been around anyone who has been exposed to Covid 19, or has mentioned symptoms of Covid 19 within the past 7 to 10 days?  If you have any concerns/questions about symptoms patients report during screening (either on the phone or at threshold). Contact the provider seeing the patient or DOD for further guidance.  If neither are available contact a member of the leadership team.  Pt answered "no" to all questions. Aware to come alone to appointment and to wear her own mask.

## 2018-11-02 ENCOUNTER — Other Ambulatory Visit: Payer: Self-pay

## 2018-11-02 ENCOUNTER — Ambulatory Visit (INDEPENDENT_AMBULATORY_CARE_PROVIDER_SITE_OTHER): Payer: PRIVATE HEALTH INSURANCE | Admitting: *Deleted

## 2018-11-02 DIAGNOSIS — I428 Other cardiomyopathies: Secondary | ICD-10-CM

## 2018-11-02 DIAGNOSIS — Z95810 Presence of automatic (implantable) cardiac defibrillator: Secondary | ICD-10-CM

## 2018-11-02 LAB — CUP PACEART INCLINIC DEVICE CHECK
Brady Statistic RV Percent Paced: 0 %
Date Time Interrogation Session: 20200727040000
HighPow Impedance: 70 Ohm
Implantable Lead Implant Date: 20200723
Implantable Lead Location: 753860
Implantable Lead Model: 293
Implantable Lead Serial Number: 443823
Implantable Pulse Generator Implant Date: 20200723
Lead Channel Impedance Value: 567 Ohm
Lead Channel Pacing Threshold Amplitude: 0.5 V
Lead Channel Pacing Threshold Pulse Width: 0.4 ms
Lead Channel Sensing Intrinsic Amplitude: 25 mV
Lead Channel Setting Pacing Amplitude: 3.5 V
Lead Channel Setting Pacing Pulse Width: 0.4 ms
Lead Channel Setting Sensing Sensitivity: 0.5 mV
Pulse Gen Serial Number: 256025

## 2018-11-02 NOTE — Progress Notes (Signed)
ICD wound check added-on per Dr. Lovena Le for assessment of patient-reported left chest and arm swelling. Steri-strips clean, dry, and intact until wound check on 11/12/18. Left chest and left arm without redness or significant edema, left hand appears similar in size to right hand. Reiterated instructions from Dr. Lovena Le on 7/24--encouraged pt to try acetaminophen for incisional discomfort and to elevate left arm to minimize swelling. Dr. Curt Bears assessed site--pt safe to resume Eliquis. Pt verbalizes understanding of all instructions.  ICD checked as patient was d/c same-day of implant. Normal device function. Threshold, sensing, and impedances consistent with implant measurements. Device programmed at 3.5V for extra safety margin until 3 month visit. Histogram distribution appropriate for patient and level of activity. No ventricular arrhythmias noted. Patient educated about arm mobility restrictions and shock plan. Full wound check on 11/12/18.

## 2018-11-02 NOTE — Patient Instructions (Addendum)
Resume your Eliquis 1 tablet (5mg ) twice daily as instructed.  Keep your wound check appointment on 11/12/18 at 10:30am.

## 2018-11-11 ENCOUNTER — Other Ambulatory Visit: Payer: Self-pay

## 2018-11-11 ENCOUNTER — Telehealth: Payer: Self-pay

## 2018-11-11 ENCOUNTER — Ambulatory Visit (INDEPENDENT_AMBULATORY_CARE_PROVIDER_SITE_OTHER): Payer: PRIVATE HEALTH INSURANCE | Admitting: "Endocrinology

## 2018-11-11 ENCOUNTER — Encounter: Payer: Self-pay | Admitting: "Endocrinology

## 2018-11-11 DIAGNOSIS — E1165 Type 2 diabetes mellitus with hyperglycemia: Secondary | ICD-10-CM | POA: Diagnosis not present

## 2018-11-11 MED ORDER — BASAGLAR KWIKPEN 100 UNIT/ML ~~LOC~~ SOPN: PEN_INJECTOR | SUBCUTANEOUS | 2 refills | Status: DC

## 2018-11-11 MED ORDER — GLIPIZIDE ER 5 MG PO TB24: 5.0000 mg | ORAL_TABLET | Freq: Every day | ORAL | 3 refills | Status: DC

## 2018-11-11 MED ORDER — CONTOUR NEXT TEST VI STRP: ORAL_STRIP | 3 refills | Status: DC

## 2018-11-11 NOTE — Telephone Encounter (Signed)
    COVID-19 Pre-Screening Questions:  . In the past 7 to 10 days have you had a cough,  shortness of breath, headache, congestion, fever (100 or greater) body aches, chills, sore throat, or sudden loss of taste or sense of smell? No . Have you been around anyone with known Covid 19. No . Have you been around anyone who is awaiting Covid 19 test results in the past 7 to 10 days? No . Have you been around anyone who has been exposed to Covid 19, or has mentioned symptoms of Covid 19 within the past 7 to 10 days? No  If you have any concerns/questions about symptoms patients report during screening (either on the phone or at threshold). Contact the provider seeing the patient or DOD for further guidance.  If neither are available contact a member of the leadership team.          Pt answered no to all covid-19 prescreening questions. I asked the pt to wear a mask if she have one. I let the pt know we are reducing the amount of people coming into the office and if she can come alone to please do so. The pt verbalized understanding.

## 2018-11-11 NOTE — Progress Notes (Signed)
Endocrinology follow-up note     11/11/2018, 1:22 PM   Subjective:    [name], female    DOB: 12-31-1970.  ARIAN MCQUITTY is being seen in f/u  for management of currently uncontrolled symptomatic diabetes requested by  Mikey Kirschner, MD.   Past Medical History:  Diagnosis Date  . Arrhythmia   . CAD (coronary artery disease)    a. 03/2018: cath showing 60% mid-LAD stenosis not significant by FFR.   Marland Kitchen Chronic combined systolic (congestive) and diastolic (congestive) heart failure ([place])    a. 03/2018: found to have a newly reduced EF of 20% by echocardiogram  . Depression   . Diabetes mellitus   . Hypertension   . Microproteinuria    Past Surgical History:  Procedure Laterality Date  . BACK SURGERY    . BREAST BIOPSY Left 2017   benign  . ICD IMPLANT N/A 10/29/2018   Procedure: ICD IMPLANT;  Surgeon: Evans Lance, MD;  Location: [facility];  Service: Cardiovascular;  Laterality: N/A;  . INTRAVASCULAR PRESSURE WIRE/FFR STUDY N/A 03/24/2018   Procedure: INTRAVASCULAR PRESSURE WIRE/FFR STUDY;  Surgeon: Leonie Man, MD;  Location: [facility];  Service: Cardiovascular;  Laterality: N/A;  . NOSE SURGERY    . RIGHT/LEFT HEART CATH AND CORONARY ANGIOGRAPHY N/A 03/24/2018   Procedure: RIGHT/LEFT HEART CATH AND CORONARY ANGIOGRAPHY;  Surgeon: Leonie Man, MD;  Location: [facility];  Service: Cardiovascular;  Laterality: N/A;  . TONSILLECTOMY    . WISDOM TOOTH EXTRACTION     Social History   Socioeconomic History  . Marital status: Married    Spouse name: Not on file  . Number of children: Not on file  . Years of education: Not on file  . Highest education level: Not on file  Occupational History  . Not on file  Social Needs  . Financial resource strain: Not on file  . Food insecurity    Worry: Not on file    Inability: Not on file  .  Transportation needs    Medical: Not on file    Non-medical: Not on file  Tobacco Use  . Smoking status: Never Smoker  . Smokeless tobacco: Never Used  Substance and Sexual Activity  . Alcohol use: No  . Drug use: No  . Sexual activity: Yes    Birth control/protection: Pill  Lifestyle  . Physical activity    Days per week: Not on file    Minutes per session: Not on file  . Stress: Not on file  Relationships  . Social Herbalist on phone: Not on file    Gets together: Not on file    Attends religious service: Not on file    Active member of club or organization: Not on file    Attends meetings of clubs or organizations: Not on file    Relationship status: Not on file  Other Topics Concern  . Not on file  Social History Narrative  . Not on file   Outpatient Encounter Medications as of 11/11/2018  Medication Sig  . apixaban (ELIQUIS) 5 MG TABS  tablet Take 1 tablet (5 mg total) by mouth 2 (two) times daily. (Patient not taking: Reported on 11/02/2018)  . atorvastatin (LIPITOR) 10 MG tablet Take 10 mg by mouth daily.  . B-D ULTRAFINE III SHORT PEN 31G X 8 MM MISC Use twice a daily to inject insulin  . famotidine (PEPCID) 20 MG tablet TAKE 1 TABLET(20 MG) BY MOUTH TWICE DAILY (Patient taking differently: Take 20 mg by mouth 2 (two) times daily. )  . furosemide (LASIX) 40 MG tablet Take 1 tablet (40 mg total) by mouth daily.  Marland Kitchen glipiZIDE (GLUCOTROL XL) 5 MG 24 hr tablet Take 1 tablet (5 mg total) by mouth daily with breakfast.  . glucose blood (CONTOUR NEXT TEST) test strip USE TO TEST BLOOD SUGAR TWICE DAILY AS DIRECTED  . Insulin Glargine (BASAGLAR KWIKPEN) 100 UNIT/ML SOPN INJECT 70 UNITS UNDER THE SKIN INTO THE SKIN AT BEDTIME  . metFORMIN (GLUCOPHAGE) 500 MG tablet Take 1 tablet (500 mg total) by mouth 2 (two) times daily with a meal. Call office to schedule appointment  . metoprolol succinate (TOPROL-XL) 100 MG 24 hr tablet Take 1 tablet (100 mg total) by mouth daily.  Take with or immediately following a meal.  . NORLYDA 0.35 MG tablet Take 1 tablet by mouth daily.   . sacubitril-valsartan (ENTRESTO) 49-51 MG Take 1 tablet by mouth 2 (two) times daily.  . [DISCONTINUED] B-D ULTRAFINE III SHORT PEN 31G X 8 MM MISC USE FOR INSULIN INJECTIONS TWICE DAILY  . [DISCONTINUED] glucose blood (CONTOUR NEXT TEST) test strip USE TO TEST BLOOD SUGAR TWICE DAILY AS DIRECTED  . [DISCONTINUED] Insulin Glargine (BASAGLAR KWIKPEN) 100 UNIT/ML SOPN INJECT 60 UNITS UNDER THE SKIN INTO THE SKIN AT BEDTIME   No facility-administered encounter medications on file as of 11/11/2018.     ALLERGIES: Allergies  Allergen Reactions  . Aspartame And Phenylalanine Dermatitis    VACCINATION STATUS: Immunization History  Administered Date(s) Administered  . Influenza Split 02/02/2013  . Influenza,inj,Quad PF,6+ Mos 01/11/2014, 12/15/2014, 12/15/2015, 01/03/2017, 01/28/2018  . Influenza-Unspecified 03/08/2011  . Pneumococcal Polysaccharide-23 03/30/2009  . Td 02/04/2006  . Tdap 02/26/2018    Diabetes She presents for her follow-up diabetic visit. She has type 2 diabetes mellitus. Onset time: She was diagnosed at approximate age of 29 years. Her disease course has been worsening. There are no hypoglycemic associated symptoms. Pertinent negatives for hypoglycemia include no confusion, headaches, pallor or seizures. Associated symptoms include fatigue, polydipsia and polyuria. Pertinent negatives for diabetes include no chest pain and no polyphagia. There are no hypoglycemic complications. Symptoms are worsening. Diabetic complications include heart disease. Risk factors for coronary artery disease include diabetes mellitus, dyslipidemia, hypertension, family history and sedentary lifestyle. Current diabetic treatments: She is currently on Lantus 70 units nightly, glipizide 5 mg once a day, metformin 500 mg p.o. twice daily. Her weight is fluctuating minimally. She is following a generally  unhealthy diet. When asked about meal planning, she reported none. She has had a previous visit with a dietitian. She never participates in exercise. Her home blood glucose trend is fluctuating minimally. (She did not bring any meter nor logs to review.  Her previsit labs show A1c of 9.3% increasing from 8.2%.  ) An ACE inhibitor/angiotensin II receptor blocker is not being taken. Eye exam is current.  Hyperlipidemia This is a chronic problem. The current episode started more than 1 year ago. Exacerbating diseases include diabetes. Pertinent negatives include no chest pain, myalgias or shortness of breath. Current antihyperlipidemic treatment includes  statins. Risk factors for coronary artery disease include dyslipidemia, diabetes mellitus, hypertension, a sedentary lifestyle and family history.  Hypertension This is a chronic problem. The current episode started more than 1 year ago. Pertinent negatives include no chest pain, headaches, palpitations or shortness of breath. Risk factors for coronary artery disease include diabetes mellitus, dyslipidemia, family history and sedentary lifestyle. Past treatments include beta blockers. Hypertensive end-organ damage includes CAD/MI and heart failure.    Review of Systems  Constitutional: Positive for fatigue. Negative for chills, fever and unexpected weight change.  HENT: Negative for trouble swallowing and voice change.   Eyes: Negative for visual disturbance.  Respiratory: Negative for cough, shortness of breath and wheezing.   Cardiovascular: Negative for chest pain, palpitations and leg swelling.  Gastrointestinal: Negative for diarrhea, nausea and vomiting.  Endocrine: Positive for polydipsia and polyuria. Negative for cold intolerance, heat intolerance and polyphagia.  Musculoskeletal: Negative for arthralgias and myalgias.  Skin: Negative for color change, pallor, rash and wound.  Neurological: Negative for seizures and headaches.   Psychiatric/Behavioral: Negative for confusion and suicidal ideas.    Objective:    BP 115/79   Pulse 79   Ht 5' 7.5" (1.715 m)   Wt 190 lb (86.2 kg)   BMI 29.32 kg/m   Wt Readings from Last 3 Encounters:  11/11/18 190 lb (86.2 kg)  10/19/18 193 lb (87.5 kg)  10/19/18 189 lb (85.7 kg)     Physical Exam Constitutional:      Appearance: She is well-developed.  HENT:     Head: Normocephalic and atraumatic.  Neck:     Musculoskeletal: Normal range of motion and neck supple.     Thyroid: No thyromegaly.     Trachea: No tracheal deviation.  Cardiovascular:     Rate and Rhythm: Normal rate and regular rhythm.  Pulmonary:     Effort: Pulmonary effort is normal.     Breath sounds: Normal breath sounds.  Abdominal:     General: Bowel sounds are normal.     Palpations: Abdomen is soft.     Tenderness: There is no abdominal tenderness. There is no guarding.  Musculoskeletal: Normal range of motion.  Skin:    General: Skin is warm and dry.     Coloration: Skin is not pale.     Findings: No erythema or rash.  Neurological:     Mental Status: She is alert and oriented to person, place, and time.     Cranial Nerves: No cranial nerve deficit.     Coordination: Coordination normal.     Deep Tendon Reflexes: Reflexes are normal and symmetric.  Psychiatric:        Judgment: Judgment normal.     CMP ( most recent) CMP     Component Value Date/Time   NA 135 10/20/2018 1111   NA 138 04/03/2018 1240   K 4.5 10/20/2018 1111   CL 99 10/20/2018 1111   CO2 26 10/20/2018 1111   GLUCOSE 278 (H) 10/20/2018 1111   BUN 18 10/20/2018 1111   BUN 20 04/03/2018 1240   CREATININE 0.84 10/20/2018 1111   CREATININE 0.74 04/06/2014 0934   CALCIUM 9.2 10/20/2018 1111   PROT 7.4 10/20/2018 1111   PROT 6.5 03/26/2017 0916   ALBUMIN 3.9 10/20/2018 1111   ALBUMIN 4.0 03/26/2017 0916   AST 17 10/20/2018 1111   ALT 21 10/20/2018 1111   ALKPHOS 81 10/20/2018 1111   BILITOT 0.5 10/20/2018  1111   BILITOT 0.4 03/26/2017 0916  GFRNONAA >60 10/20/2018 1111   GFRAA >60 10/20/2018 1111     Diabetic Labs (most recent): Lab Results  Component Value Date   HGBA1C 9.3 (H) 09/29/2018   HGBA1C 8.2 (H) 03/25/2018   HGBA1C 7.4 (A) 01/15/2018     Lipid Panel ( most recent) Lipid Panel     Component Value Date/Time   CHOL 158 09/29/2018 1206   CHOL 202 (H) 03/26/2017 0916   TRIG 109 09/29/2018 1206   HDL 36 (L) 09/29/2018 1206   HDL 45 03/26/2017 0916   CHOLHDL 4.4 09/29/2018 1206   VLDL 22 09/29/2018 1206   LDLCALC 100 (H) 09/29/2018 1206   LDLCALC 134 (H) 03/26/2017 0916      Assessment & Plan:   1. Uncontrolled type 2 diabetes mellitus with hyperglycemia ([place])  - TAMECA JEREZ has currently uncontrolled symptomatic type 2 DM since  48 years of age.  She did not bring any logs nor meter to review, A1c 9.3% increasing from 8.2%. - I had a repeat long discussion with her about the progressive nature of diabetes and the pathology behind its complications. -her diabetes is complicated by coronary artery disease/CHF and she remains at a high risk for more acute and chronic complications which include CAD, CVA, CKD, retinopathy, and neuropathy. These are all discussed in detail with her.  - I have counseled her on diet management and weight loss, by adopting a carbohydrate restricted/protein rich diet.  - she  admits there is a room for improvement in her diet and drink choices. -  Suggestion is made for her to avoid simple carbohydrates  from her diet including Cakes, Sweet Desserts / Pastries, Ice Cream, Soda (diet and regular), Sweet Tea, Candies, Chips, Cookies, Sweet Pastries,  Store Bought Juices, Alcohol in Excess of  1-2 drinks a day, Artificial Sweeteners, Coffee Creamer, and "Sugar-free" Products. This will help patient to have stable blood glucose profile and potentially avoid unintended weight gain.   - I encouraged her to switch to  unprocessed or minimally  processed complex starch and increased protein intake (animal or plant source), fruits, and vegetables.  - she is advised to stick to a routine mealtimes to eat 3 meals  a day and avoid unnecessary snacks ( to snack only to correct hypoglycemia).   - she will be scheduled with Jearld Fenton, RDN, CDE for individualized diabetes education.  - I have approached her with the following individualized plan to manage diabetes and patient agrees:   -Given her prior A1c of 9.3%, she will require higher dose of insulin.  However patient does not display appropriate engagement for proper monitoring of blood glucose.  This will make it difficult to optimize her insulin treatment.   -She is advised to increase her  Basaglar slightly to 70 units nightly, advised to start strict monitoring of blood glucose 4 times a day-daily before meals and at bedtime and return in 2 weeks for reevaluation.    - she is encouraged to call clinic for blood glucose levels less than 70 or above 300 mg /dl. - she is advised to continue metformin 500 mg p.o. twice daily, therapeutically suitable for patient . -I discussed and reinitiated glipizide at a lower dose of 5 mg XL p.o. daily at breakfast.  - Patient specific target  A1c;  LDL, HDL, Triglycerides, and  Waist Circumference were discussed in detail.  2) Blood Pressure /Hypertension:  her blood pressure is controlled to target.   she is advised to continue her  current medications including lisinopril 2.5 mg p.o. daily with breakfast . 3) Lipids/Hyperlipidemia:   Review of her recent lipid panel showed uncontrolled  LDL at 137 .  she  Is not on any statins.  She would like to see her next results before initiating statin therapy.   4)  Weight/Diet:  Body mass index is 29.32 kg/m.  -   I discussed with her the fact that loss of 5 - 10% of her  current body weight will have the most impact on her diabetes management.  CDE Consult will be initiated . Exercise, and detailed  carbohydrates information provided  -  detailed on discharge instructions.  5) Chronic Care/Health Maintenance:  -she  is on ACEI medications and  is encouraged to initiate and continue to follow up with Ophthalmology, Dentist,  Podiatrist at least yearly or according to recommendations, and advised to  stay away from smoking. I have recommended yearly flu vaccine and pneumonia vaccine at least every 5 years; moderate intensity exercise for up to 150 minutes weekly; and  sleep for at least 7 hours a day.  - she is  advised to maintain close follow up with Mikey Kirschner, MD for primary care needs, as well as her other providers for optimal and coordinated care.  - Time spent with the patient: 25 min, of which >50% was spent in reviewing her blood glucose logs , discussing her hypoglycemia and hyperglycemia episodes, reviewing her current and  previous labs / studies and medications  doses and developing a plan to avoid hypoglycemia and hyperglycemia. Please refer to Patient Instructions for Blood Glucose Monitoring and Insulin/Medications Dosing Guide"  in media tab for additional information. Please  also refer to " Patient Self Inventory" in the Media  tab for reviewed elements of pertinent patient history.  [name] participated in the discussions, expressed understanding, and voiced agreement with the above plans.  All questions were answered to her satisfaction. she is encouraged to contact clinic should she have any questions or concerns prior to her return visit.  Follow up plan: - Return in about 2 weeks (around 11/25/2018) for Follow up with Meter and Logs Only - no Labs.  Glade Lloyd, MD [facility] [address] [place], [place] [address] Phone: [phone]  Fax: [phone]    11/11/2018, 1:22 PM  This note was partially dictated with voice recognition software. Similar sounding words can be transcribed inadequately or  may not  be corrected upon review.

## 2018-11-11 NOTE — Patient Instructions (Signed)

## 2018-11-12 ENCOUNTER — Ambulatory Visit (INDEPENDENT_AMBULATORY_CARE_PROVIDER_SITE_OTHER): Payer: PRIVATE HEALTH INSURANCE | Admitting: *Deleted

## 2018-11-12 DIAGNOSIS — I420 Dilated cardiomyopathy: Secondary | ICD-10-CM | POA: Diagnosis not present

## 2018-11-12 LAB — CUP PACEART INCLINIC DEVICE CHECK
Date Time Interrogation Session: 20200806040000
HighPow Impedance: 69 Ohm
Implantable Lead Implant Date: 20200723
Implantable Lead Location: 753860
Implantable Lead Model: 293
Implantable Lead Serial Number: 443823
Implantable Pulse Generator Implant Date: 20200723
Lead Channel Impedance Value: 551 Ohm
Lead Channel Pacing Threshold Amplitude: 0.6 V
Lead Channel Pacing Threshold Pulse Width: 0.4 ms
Lead Channel Sensing Intrinsic Amplitude: 25 mV
Lead Channel Setting Pacing Amplitude: 3.5 V
Lead Channel Setting Pacing Pulse Width: 0.4 ms
Lead Channel Setting Sensing Sensitivity: 0.5 mV
Pulse Gen Serial Number: 256025

## 2018-11-12 NOTE — Progress Notes (Signed)
Wound check appointment. Steri-strips removed. Wound without redness or edema. Incision edges approximated, wound well healed. Normal device function. Thresholds, sensing, and impedances consistent with implant measurements. Device programmed at 3.5V for extra safety margin until 3 month visit. Histogram distribution appropriate for patient and level of activity. No mode switches or ventricular arrhythmias noted. Patient educated about wound care, arm mobility, lifting restrictions, shock plan. Remote scheduled 05/06/18 & ROV 02/04/19 w/ GT.

## 2018-11-13 ENCOUNTER — Ambulatory Visit (INDEPENDENT_AMBULATORY_CARE_PROVIDER_SITE_OTHER): Payer: PRIVATE HEALTH INSURANCE | Admitting: Cardiology

## 2018-11-13 ENCOUNTER — Encounter: Payer: Self-pay | Admitting: Cardiology

## 2018-11-13 ENCOUNTER — Other Ambulatory Visit: Payer: Self-pay

## 2018-11-13 VITALS — BP 108/72 | HR 81 | Ht 67.5 in | Wt 195.2 lb

## 2018-11-13 DIAGNOSIS — G4739 Other sleep apnea: Secondary | ICD-10-CM | POA: Diagnosis not present

## 2018-11-13 DIAGNOSIS — I428 Other cardiomyopathies: Secondary | ICD-10-CM | POA: Diagnosis not present

## 2018-11-13 DIAGNOSIS — I5022 Chronic systolic (congestive) heart failure: Secondary | ICD-10-CM

## 2018-11-13 MED ORDER — METOPROLOL SUCCINATE ER 100 MG PO TB24: ORAL_TABLET | ORAL | 1 refills | Status: DC

## 2018-11-13 NOTE — Progress Notes (Signed)
Clinical Summary Tara Bryant is a 48 y.o.female seen today for follow up of the following medical problems.   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  - 08/2018 echo: LVEF 25-30%, grade II diastolic dysfunction, mild RV dysfunction - 10/29/18 ICD implant[employer]   - entresto approved by insurance - no recent SOB/DOE. Compliant with meds   2. Pulmonary hypertension - type II, her mean PA was 45 and PCWP 41 in setting of severel volume overload. Moderate RV dysfunction by MRI  3. CAD - moderate nonobstructive disease by cath - denies any chest pain   4. Sinus tachcyardia - resolved since last visit. Negative workup. She has had prior visits with sinus tach of unclear etiology.    5. OSA - she reports tested prior to 2012, she is unsure of results - +snoring, unsure of apneic episodes, +daytime somnolence.      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 Arrhythmia   . CAD (coronary artery disease)    a. 03/2018: cath showing 60% mid-LAD stenosis not significant by FFR.   Marland Kitchen Chronic combined systolic (congestive) and diastolic (congestive) heart failure ([place])    a. 03/2018: found to have a newly reduced EF of 20% by echocardiogram  . Depression   . Diabetes mellitus   . Hypertension   . Microproteinuria      Allergies  Allergen Reactions  . Aspartame And Phenylalanine Dermatitis     Current  Outpatient Medications  Medication Sig Dispense Refill  . apixaban (ELIQUIS) 5 MG TABS tablet Take 1 tablet (5 mg total) by mouth 2 (two) times daily. 180 tablet 3  . atorvastatin (LIPITOR) 10 MG tablet Take 10 mg by mouth daily.    . B-D ULTRAFINE III SHORT PEN 31G X 8 MM MISC Use twice a daily to inject insulin 100 each 5  . famotidine (PEPCID) 20 MG tablet TAKE 1 TABLET(20 MG) BY MOUTH TWICE DAILY (Patient taking differently: Take 20 mg by mouth 2 (two) times daily. ) 60 tablet 5  . furosemide (LASIX) 40 MG tablet Take 1 tablet (40 mg total) by mouth daily. 30 tablet 6  . glipiZIDE (GLUCOTROL XL) 5 MG 24 hr tablet Take 1 tablet (5 mg total) by mouth daily with breakfast. 30 tablet 3  . glucose blood (CONTOUR NEXT TEST) test strip USE TO TEST BLOOD SUGAR TWICE DAILY AS DIRECTED 100 each 3  . Insulin Glargine (BASAGLAR KWIKPEN) 100 UNIT/ML SOPN INJECT 70 UNITS UNDER THE SKIN INTO THE SKIN AT BEDTIME 15 mL 2  . metFORMIN (GLUCOPHAGE) 500 MG tablet Take 1 tablet (500 mg total) by mouth 2 (two) times daily with a meal. Call office to schedule appointment 180 tablet 0  . metoprolol succinate (TOPROL-XL) 100 MG 24 hr tablet Take 1 tablet (100 mg total) by mouth daily. Take with or immediately following a meal. 90 tablet 1  . NORLYDA 0.35 MG tablet Take 1 tablet by mouth daily.  12  . sacubitril-valsartan (ENTRESTO) 49-51 MG Take 1 tablet by mouth 2 (two) times daily. 60 tablet 6   No current facility-administered medications for this visit.      Past Surgical History:  Procedure Laterality Date  . BACK SURGERY    . BREAST BIOPSY Left 2017   benign  . ICD IMPLANT N/A 10/29/2018   Procedure: ICD IMPLANT;  Surgeon: Evans Lance, MD;  Location: [facility];  Service: Cardiovascular;  Laterality: N/A;  . INTRAVASCULAR PRESSURE WIRE/FFR STUDY N/A 03/24/2018   Procedure: INTRAVASCULAR PRESSURE WIRE/FFR STUDY;  Surgeon: Leonie Man, MD;  Location: [facility];  Service:  Cardiovascular;  Laterality: N/A;  . NOSE SURGERY    . RIGHT/LEFT HEART CATH AND CORONARY ANGIOGRAPHY N/A 03/24/2018   Procedure: RIGHT/LEFT HEART CATH AND CORONARY ANGIOGRAPHY;  Surgeon: Leonie Man, MD;  Location: [facility];  Service: Cardiovascular;  Laterality: N/A;  . TONSILLECTOMY    . WISDOM TOOTH EXTRACTION       Allergies  Allergen Reactions  . Aspartame And Phenylalanine Dermatitis      Family History  Problem Relation Age of Onset  . Stroke Sister 1       smoker, diabetic, HTN  . Breast cancer Sister   . Cancer Mother        breast  . Breast cancer Mother   . Hypertension Mother   . Diabetes Other   . Heart attack Other   . Chronic Renal Failure Father      Social History Tara Bryant reports that she has never smoked. She has never used smokeless tobacco. Tara Bryant reports no history of alcohol use.   Review of Systems CONSTITUTIONAL: No weight loss, fever, chills, weakness or fatigue.  HEENT: Eyes: No visual loss, blurred vision, double vision or yellow sclerae.No hearing loss, sneezing, congestion, runny nose or sore throat.  SKIN: No rash or itching.  CARDIOVASCULAR: per hpi RESPIRATORY: No shortness of breath, cough or sputum.  GASTROINTESTINAL: No anorexia, nausea, vomiting or diarrhea. No abdominal pain or blood.  GENITOURINARY: No burning on urination, no polyuria NEUROLOGICAL: No headache, dizziness, syncope, paralysis, ataxia, numbness or tingling in the extremities. No change in bowel or bladder control.  MUSCULOSKELETAL: No muscle, back pain, joint pain or stiffness.  LYMPHATICS: No enlarged nodes. No history of splenectomy.  PSYCHIATRIC: No history of depression or anxiety.  ENDOCRINOLOGIC: No reports of sweating, cold or heat intolerance. No polyuria or polydipsia.  Marland Kitchen   Physical Examination Today's Vitals   11/13/18 1610  BP: 108/72  Pulse: 81  SpO2: 99%  Weight: 195 lb 3.2 oz (88.5 kg)  Height: 5' 7.5" (1.715 m)    Body mass index is 30.12 kg/m.  Gen: resting comfortably, no acute distress HEENT: no scleral icterus, pupils equal round and reactive, no palptable cervical adenopathy,  CV: RRR, no mrg, no jvd Resp: Clear to auscultation bilaterally GI: abdomen is soft, non-tender, non-distended, normal bowel sounds, no hepatosplenomegaly MSK: extremities are warm, no edema.  Skin: warm, no rash Neuro:  no focal deficits Psych: appropriate affect   Diagnostic Studies 03/2018 echo LVEF 20%, diffuse hypokinesis, cannot eval diastolic function, mild RV dysfunction, PASP 47, small pericardial effusion  03/2018 LHC/RHC  Prox LAD lesion is 55% stenosed. DFR 0.92  Otherwise angiographically normal coronary arteries.  Hemodynamic findings consistent with severe pulmonary hypertension.  LV end diastolic pressure is severely elevated.  SUMMARY:  Moderate single-vessel disease with roughly 60% mid LAD (DFR 0.92 -  not physiologically significant)  Otherwise minimal CAD.  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 (Output 4.48, Index 2.2.)   03/2018 cardiac MRI FINDINGS: 1. Moderately dilated left ventricle with mild concentric hypertrophy and severe systolic function (LVEF = 27%). Severe diffuse hypokinesis and paradoxical septal motion. There is no late gadolinium enhancement in the left ventricular myocardium. There is significant hyper trabeculation of the left ventricle with ratio of non-compacted to compacted myocardium > 4:1.  LVEDD: 61 mm  LVESD: 51 mm  LVEDV: 170 ml  LVESV: 124 ml  SV: 46 ml  CO: 4.6 L/min  Myocardial mass: 166 g  2. Normal right ventricular size, thickness and moderate systolic dysfunction (LVEF = 30%). There are no regional wall motion abnormalities.  3. Normal  left and right atrial size.  4. Normal size of the aortic root, ascending aorta and pulmonary artery.  5. Trivial mitral and mild tricuspid regurgitation.  6. Normal pericardium. Mild circumferential pericardial effusion, moderate amount inferior to the heart (up to 11 mm).  IMPRESSION: 1. Moderately dilated left ventricle with mild concentric hypertrophy and severe systolic function (LVEF = 27%). Severe diffuse hypokinesis and paradoxical septal motion. There is no late gadolinium enhancement in the left ventricular myocardium. There is significant hyper trabeculation of the left ventricle with ratio of non-compacted to compacted myocardium > 4:1.  2. Normal right ventricular size, thickness and moderate systolic dysfunction (LVEF = 30%). There are no regional wall motion abnormalities.  3. Normal left and right atrial size.  4. Normal size of the aortic root, ascending aorta and pulmonary artery.  5. Trivial mitral and mild tricuspid regurgitation.  6. Normal pericardium. Mild circumferential pericardial effusion, moderate amount inferior to the heart (up to 11 mm).  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   08/2018 echo IMPRESSIONS   1. The left ventricle has severely reduced systolic function, with an ejection fraction of 25-30%. The cavity size was normal. There is mild concentric left ventricular hypertrophy. Left ventricular diastolic Doppler parameters are consistent with  pseudonormalization. Elevated left ventricular end-diastolic pressure Left ventricular diffuse hypokinesis. 2. The left ventricular apex appears trabeculated. 3. The right ventricle has mildly reduced systolic function. The cavity was normal. There is mildly increased right ventricular wall thickness. 4. The mitral valve is grossly normal. Mild thickening of the mitral valve leaflet. Mitral valve  regurgitation is mild to moderate by color flow Doppler. 5. The tricuspid valve is grossly normal. 6. The aortic valve is grossly normal. Mild thickening of the aortic valve. 7. The aortic root is normal in size and structure.    Assessment and Plan  1.Chronic systolic HF/Noncompaction cardiomyopathy - NICM, noncompaction CM by MRI - medical therapy previously limited by low bp's, her bps have trended upward and we have been titrating up her meds. - has been on anticoag in setting of noncompaction and low LVEF, continue at this time.  - increase toprol to 150mg  daily.  - we have referred to CHF clinic. Young patient with limited comorbidities, would want her plugged in early in case advanced therapies become indicated.  - refer to Dr Luan Pulling for OSA evaluation   F/u 2 months   Arnoldo Lenis, M.D

## 2018-11-13 NOTE — Patient Instructions (Addendum)
Medication Instructions:   Your physician has recommended you make the following change in your medication:   Increase toprol xl to 150 mg by mouth daily  Continue all other medications the same  Labwork:  NONE  Testing/Procedures:  NONE  Follow-Up:  Your physician recommends that you schedule a follow-up appointment in: 2 months.  Any Other Special Instructions Will Be Listed Below (If Applicable).  Refer to the Heart Failure Clinic  Refer to Dr. Luan Pulling  If you need a refill on your cardiac medications before your next appointment, please call your pharmacy.

## 2018-11-27 ENCOUNTER — Telehealth: Payer: Self-pay | Admitting: Internal Medicine

## 2018-11-27 NOTE — Telephone Encounter (Signed)
Returned call to patient. She reports slight redness directly at incision line, intermittent itching. No swelling, drainage, fever, or chills. Occasionally notes discomfort at site, which she reports may correlate with seatbelt use. First noticed itching about a week ago, but reports incision looks like it is healing. No stitch or scab noted. Advised to continue monitoring for signs/symptoms of infection over the weekend. Advised to avoid scratching site. Explained that her description sounds like normal healing progression. She is unable to send a photo of her site via [place]. Encouraged pt to call back next week if she has additional concerns, or if she wishes to come into the [facility] for an RN assessment. Pt in agreement with plan, no further questions at this time.

## 2018-11-27 NOTE — Telephone Encounter (Signed)
New Message    Patient is calling in with pain and redness around the incision where the pacemaker was put in. Patient states that it hurts and sometimes it itches and she wants to make sure that is normal. Please give patient a call back.

## 2018-11-30 ENCOUNTER — Ambulatory Visit (INDEPENDENT_AMBULATORY_CARE_PROVIDER_SITE_OTHER): Payer: PRIVATE HEALTH INSURANCE | Admitting: "Endocrinology

## 2018-11-30 ENCOUNTER — Other Ambulatory Visit: Payer: Self-pay

## 2018-11-30 ENCOUNTER — Encounter: Payer: Self-pay | Admitting: "Endocrinology

## 2018-11-30 DIAGNOSIS — E782 Mixed hyperlipidemia: Secondary | ICD-10-CM

## 2018-11-30 DIAGNOSIS — E1165 Type 2 diabetes mellitus with hyperglycemia: Secondary | ICD-10-CM | POA: Diagnosis not present

## 2018-11-30 DIAGNOSIS — I10 Essential (primary) hypertension: Secondary | ICD-10-CM | POA: Diagnosis not present

## 2018-11-30 MED ORDER — METFORMIN HCL 500 MG PO TABS: 500.0000 mg | ORAL_TABLET | Freq: Two times a day (BID) | ORAL | 0 refills | Status: DC

## 2018-11-30 MED ORDER — BASAGLAR KWIKPEN 100 UNIT/ML ~~LOC~~ SOPN: PEN_INJECTOR | SUBCUTANEOUS | 2 refills | Status: DC

## 2018-11-30 NOTE — Progress Notes (Signed)
11/30/2018, 4:17 PM                                                    Endocrinology Telehealth Visit Follow up Note -During COVID -19 Pandemic  This visit type was conducted due to national recommendations for restrictions regarding the COVID-19 Pandemic  in an effort to limit this patient's exposure and mitigate transmission of the corona virus.  Due to her co-morbid illnesses, Tara Bryant is at  moderate to high risk for complications without adequate follow up.  This format is felt to be most appropriate for her at this time.  I connected with this patient on 11/30/2018   by telephone and verified that I am speaking with the correct person using two identifiers. Tara Bryant, [birth year]. she has verbally consented to this visit. All issues noted in this document were discussed and addressed. The format was not optimal for physical exam.    Subjective:    [name], female    DOB: 09-25-1970.  Tara Bryant is being engaged in telehealth via telephone for f/u management of currently uncontrolled symptomatic diabetes requested by  Mikey Kirschner, MD.   Past Medical History:  Diagnosis Date  . Arrhythmia   . CAD (coronary artery disease)    a. 03/2018: cath showing 60% mid-LAD stenosis not significant by FFR.   Marland Kitchen Chronic combined systolic (congestive) and diastolic (congestive) heart failure ([place])    a. 03/2018: found to have a newly reduced EF of 20% by echocardiogram  . Depression   . Diabetes mellitus   . Hypertension   . Microproteinuria    Past Surgical History:  Procedure Laterality Date  . BACK SURGERY    . BREAST BIOPSY Left 2017   benign  . ICD IMPLANT N/A 10/29/2018   Procedure: ICD IMPLANT;  Surgeon: Evans Lance, MD;  Location: [facility];  Service: Cardiovascular;  Laterality: N/A;  . INTRAVASCULAR PRESSURE WIRE/FFR STUDY N/A 03/24/2018   Procedure: INTRAVASCULAR PRESSURE WIRE/FFR STUDY;  Surgeon: Leonie Man, MD;  Location: [facility];  Service: Cardiovascular;  Laterality: N/A;  . NOSE SURGERY    . RIGHT/LEFT HEART CATH AND CORONARY ANGIOGRAPHY N/A 03/24/2018   Procedure: RIGHT/LEFT HEART CATH AND CORONARY ANGIOGRAPHY;  Surgeon: Leonie Man, MD;  Location: [facility];  Service: Cardiovascular;  Laterality: N/A;  . TONSILLECTOMY    . WISDOM TOOTH EXTRACTION     Social History   Socioeconomic History  . Marital status: Married    Spouse name: Not on file  . Number of children: Not on file  . Years of education: Not on file  . Highest education level: Not on file  Occupational History  . Not on file  Social Needs  . Financial resource strain: Not on file  . Food insecurity    Worry: Not on file    Inability: Not on file  . Transportation needs  Medical: Not on file    Non-medical: Not on file  Tobacco Use  . Smoking status: Never Smoker  . Smokeless tobacco: Never Used  Substance and Sexual Activity  . Alcohol use: No  . Drug use: No  . Sexual activity: Yes    Birth control/protection: Pill  Lifestyle  . Physical activity    Days per week: Not on file    Minutes per session: Not on file  . Stress: Not on file  Relationships  . Social Herbalist on phone: Not on file    Gets together: Not on file    Attends religious service: Not on file    Active member of club or organization: Not on file    Attends meetings of clubs or organizations: Not on file    Relationship status: Not on file  Other Topics Concern  . Not on file  Social History Narrative  . Not on file   Outpatient Encounter Medications as of 11/30/2018  Medication Sig  . apixaban (ELIQUIS) 5 MG TABS tablet Take 1 tablet (5 mg total) by mouth 2 (two) times daily.  Marland Kitchen atorvastatin (LIPITOR) 10 MG tablet Take 10 mg by mouth daily.  . B-D ULTRAFINE III SHORT PEN 31G X 8 MM MISC Use twice a daily to inject  insulin  . famotidine (PEPCID) 20 MG tablet TAKE 1 TABLET(20 MG) BY MOUTH TWICE DAILY (Patient taking differently: Take 20 mg by mouth 2 (two) times daily. )  . furosemide (LASIX) 40 MG tablet Take 1 tablet (40 mg total) by mouth daily.  Marland Kitchen glipiZIDE (GLUCOTROL) 5 MG tablet Take 2.5 mg by mouth 2 (two) times daily before a meal.  . glucose blood (CONTOUR NEXT TEST) test strip USE TO TEST BLOOD SUGAR TWICE DAILY AS DIRECTED  . Insulin Glargine (BASAGLAR KWIKPEN) 100 UNIT/ML SOPN INJECT 80 UNITS UNDER THE SKIN INTO THE SKIN AT BEDTIME  . metFORMIN (GLUCOPHAGE) 500 MG tablet Take 1 tablet (500 mg total) by mouth 2 (two) times daily with a meal. Call office to schedule appointment  . metoprolol succinate (TOPROL-XL) 100 MG 24 hr tablet Take 1&1/2 tablets (150 mg) by mouth daily. Take with or immediately following a meal.  . NORLYDA 0.35 MG tablet Take 1 tablet by mouth daily.   . sacubitril-valsartan (ENTRESTO) 49-51 MG Take 1 tablet by mouth 2 (two) times daily.  . [DISCONTINUED] Insulin Glargine (BASAGLAR KWIKPEN) 100 UNIT/ML SOPN INJECT 70 UNITS UNDER THE SKIN INTO THE SKIN AT BEDTIME  . [DISCONTINUED] metFORMIN (GLUCOPHAGE) 500 MG tablet Take 1 tablet (500 mg total) by mouth 2 (two) times daily with a meal. Call office to schedule appointment   No facility-administered encounter medications on file as of 11/30/2018.     ALLERGIES: Allergies  Allergen Reactions  . Aspartame And Phenylalanine Dermatitis    VACCINATION STATUS: Immunization History  Administered Date(s) Administered  . Influenza Split 02/02/2013  . Influenza,inj,Quad PF,6+ Mos 01/11/2014, 12/15/2014, 12/15/2015, 01/03/2017, 01/28/2018  . Influenza-Unspecified 03/08/2011  . Pneumococcal Polysaccharide-23 03/30/2009  . Td 02/04/2006  . Tdap 02/26/2018    Diabetes She presents for her follow-up diabetic visit. She has type 2 diabetes mellitus. Onset time: She was diagnosed at approximate age of 48 years. Her disease course  has been worsening. There are no hypoglycemic associated symptoms. Pertinent negatives for hypoglycemia include no confusion, headaches, pallor or seizures. Associated symptoms include fatigue, polydipsia and polyuria. Pertinent negatives for diabetes include no chest pain and no polyphagia. There are no  hypoglycemic complications. Symptoms are worsening. Diabetic complications include heart disease. Risk factors for coronary artery disease include diabetes mellitus, dyslipidemia, hypertension, family history and sedentary lifestyle. Current diabetic treatments: She is currently on Lantus 70 units nightly, glipizide 5 mg once a day, metformin 500 mg p.o. twice daily. Her weight is fluctuating minimally. She is following a generally unhealthy diet. When asked about meal planning, she reported none. She has had a previous visit with a dietitian. She never participates in exercise. Her home blood glucose trend is increasing steadily. (She reports significantly above target glycemic profile: Fasting between 128-257, prelunch 131-254, presupper 96-292.   Her previsit labs show A1c of 9.3% increasing from 8.2%.  ) An ACE inhibitor/angiotensin II receptor blocker is not being taken. Eye exam is current.  Hyperlipidemia This is a chronic problem. The current episode started more than 1 year ago. Exacerbating diseases include diabetes. Pertinent negatives include no chest pain, myalgias or shortness of breath. Current antihyperlipidemic treatment includes statins. Risk factors for coronary artery disease include dyslipidemia, diabetes mellitus, hypertension, a sedentary lifestyle and family history.  Hypertension This is a chronic problem. The current episode started more than 1 year ago. Pertinent negatives include no chest pain, headaches, palpitations or shortness of breath. Risk factors for coronary artery disease include diabetes mellitus, dyslipidemia, family history and sedentary lifestyle. Past treatments include  beta blockers. Hypertensive end-organ damage includes CAD/MI and heart failure.    Review of Systems  Constitutional: Positive for fatigue. Negative for chills, fever and unexpected weight change.  HENT: Negative for trouble swallowing and voice change.   Eyes: Negative for visual disturbance.  Respiratory: Negative for cough, shortness of breath and wheezing.   Cardiovascular: Negative for chest pain, palpitations and leg swelling.  Gastrointestinal: Negative for diarrhea, nausea and vomiting.  Endocrine: Positive for polydipsia and polyuria. Negative for cold intolerance, heat intolerance and polyphagia.  Musculoskeletal: Negative for arthralgias and myalgias.  Skin: Negative for color change, pallor, rash and wound.  Neurological: Negative for seizures and headaches.  Psychiatric/Behavioral: Negative for confusion and suicidal ideas.    Objective:    There were no vitals taken for this visit.  Wt Readings from Last 3 Encounters:  11/13/18 195 lb 3.2 oz (88.5 kg)  11/11/18 190 lb (86.2 kg)  10/19/18 193 lb (87.5 kg)      CMP ( most recent) CMP     Component Value Date/Time   NA 135 10/20/2018 1111   NA 138 04/03/2018 1240   K 4.5 10/20/2018 1111   CL 99 10/20/2018 1111   CO2 26 10/20/2018 1111   GLUCOSE 278 (H) 10/20/2018 1111   BUN 18 10/20/2018 1111   BUN 20 04/03/2018 1240   CREATININE 0.84 10/20/2018 1111   CREATININE 0.74 04/06/2014 0934   CALCIUM 9.2 10/20/2018 1111   PROT 7.4 10/20/2018 1111   PROT 6.5 03/26/2017 0916   ALBUMIN 3.9 10/20/2018 1111   ALBUMIN 4.0 03/26/2017 0916   AST 17 10/20/2018 1111   ALT 21 10/20/2018 1111   ALKPHOS 81 10/20/2018 1111   BILITOT 0.5 10/20/2018 1111   BILITOT 0.4 03/26/2017 0916   GFRNONAA >60 10/20/2018 1111   GFRAA >60 10/20/2018 1111     Diabetic Labs (most recent): Lab Results  Component Value Date   HGBA1C 9.3 (H) 09/29/2018   HGBA1C 8.2 (H) 03/25/2018   HGBA1C 7.4 (A) 01/15/2018     Lipid Panel ( most  recent) Lipid Panel     Component Value Date/Time   CHOL  158 09/29/2018 1206   CHOL 202 (H) 03/26/2017 0916   TRIG 109 09/29/2018 1206   HDL 36 (L) 09/29/2018 1206   HDL 45 03/26/2017 0916   CHOLHDL 4.4 09/29/2018 1206   VLDL 22 09/29/2018 1206   LDLCALC 100 (H) 09/29/2018 1206   LDLCALC 134 (H) 03/26/2017 0916      Assessment & Plan:   1. Uncontrolled type 2 diabetes mellitus with hyperglycemia ([place])  - LAKEYSIA KNIPPEL has currently uncontrolled symptomatic type 2 DM since  48 years of age.  She reports significantly above target glycemic profile, recent A1c of 9.3% increasing from 8.2%.  -her diabetes is complicated by coronary artery disease/CHF and she remains at a high risk for more acute and chronic complications which include CAD, CVA, CKD, retinopathy, and neuropathy. These are all discussed in detail with her.  - I have counseled her on diet management and weight loss, by adopting a carbohydrate restricted/protein rich diet. - she  admits there is a room for improvement in her diet and drink choices. -  Suggestion is made for her to avoid simple carbohydrates  from her diet including Cakes, Sweet Desserts / Pastries, Ice Cream, Soda (diet and regular), Sweet Tea, Candies, Chips, Cookies, Sweet Pastries,  Store Bought Juices, Alcohol in Excess of  1-2 drinks a day, Artificial Sweeteners, Coffee Creamer, and "Sugar-free" Products. This will help patient to have stable blood glucose profile and potentially avoid unintended weight gain.   - I encouraged her to switch to  unprocessed or minimally processed complex starch and increased protein intake (animal or plant source), fruits, and vegetables.  - she is advised to stick to a routine mealtimes to eat 3 meals  a day and avoid unnecessary snacks ( to snack only to correct hypoglycemia).   - she will be scheduled with Jearld Fenton, RDN, CDE for individualized diabetes education.  - I have approached her with the following  individualized plan to manage diabetes and patient agrees:   -Given her recent a1c of 9.3%, and uncontrolled fasting glycemic profile, she will require higher dose of insulin.  -She is advised to increase her Basaglar to 80  units nightly, advised to continue strict monitoring of blood glucose 2 times a day-daily before BKF and at bedtime .  - she is encouraged to call clinic for blood glucose levels less than 70 or above 300 mg /dl. - she is advised to continue metformin 500 mg p.o. twice daily, therapeutically suitable for patient . -I discussed and reinitiated glipizide at a lower dose of 5 mg p.o. twice daily.   - Patient specific target  A1c;  LDL, HDL, Triglycerides, and  Waist Circumference were discussed in detail.  2) Blood Pressure /Hypertension:  her blood pressure is controlled to target.   she is advised to continue her current medications including lisinopril 2.5 mg p.o. daily with breakfast . 3) Lipids/Hyperlipidemia:   Review of her recent lipid panel showed uncontrolled  LDL at 137 .  she  is not on any statins.  She would like to see her next results before initiating statin therapy.   4)  Weight/Diet: She has a BMI of 29-   I discussed with her the fact that loss of 5 - 10% of her  current body weight will have the most impact on her diabetes management.  CDE Consult will be initiated . Exercise, and detailed carbohydrates information provided  -  detailed on discharge instructions.  5) Chronic Care/Health Maintenance:  -she  is on ACEI medications and  is encouraged to initiate and continue to follow up with Ophthalmology, Dentist,  Podiatrist at least yearly or according to recommendations, and advised to  stay away from smoking. I have recommended yearly flu vaccine and pneumonia vaccine at least every 5 years; moderate intensity exercise for up to 150 minutes weekly; and  sleep for at least 7 hours a day.  - she is  advised to maintain close follow up with Mikey Kirschner,  MD for primary care needs, as well as her other providers for optimal and coordinated care.  - Patient Care Time Today:  25 min, of which >50% was spent in  counseling and the rest reviewing her  current and  previous labs/studies, previous treatments, her blood glucose readings, and medications' doses and developing a plan for long-term care based on the latest recommendations for standards of care.   Tara Bryant participated in the discussions, expressed understanding, and voiced agreement with the above plans.  All questions were answered to her satisfaction. she is encouraged to contact clinic should she have any questions or concerns prior to her return visit.  Follow up plan: - Return in about 8 weeks (around 01/25/2019), or logs 6, for Follow up with Pre-visit Labs, Meter, and Logs.  Glade Lloyd, MD [facility] [address] [place], [place] [address] Phone: [phone]  Fax: [phone]    11/30/2018, 4:17 PM  This note was partially dictated with voice recognition software. Similar sounding words can be transcribed inadequately or may not  be corrected upon review.

## 2018-12-04 ENCOUNTER — Other Ambulatory Visit: Payer: Self-pay | Admitting: Cardiology

## 2018-12-04 DIAGNOSIS — I5022 Chronic systolic (congestive) heart failure: Secondary | ICD-10-CM

## 2018-12-04 MED ORDER — FUROSEMIDE 40 MG PO TABS: 40.0000 mg | ORAL_TABLET | Freq: Every day | ORAL | 2 refills | Status: DC

## 2018-12-04 NOTE — Telephone Encounter (Signed)
° ° °  1. Which medications need to be refilled? (please list name of each medication and dose if known) furosemide (LASIX) 40 MG tablet    2. Which pharmacy/location (including street and city if local pharmacy) is medication to be sent to?  [facility]  [place]   3. Do they need a 30 day or 90 day supply? 90   Patient is requesting the 40 mg

## 2018-12-10 ENCOUNTER — Telehealth (HOSPITAL_COMMUNITY): Payer: Self-pay

## 2018-12-10 NOTE — Telephone Encounter (Signed)
Advised PT we would contact her with our next availability.

## 2018-12-28 ENCOUNTER — Other Ambulatory Visit: Payer: Self-pay | Admitting: "Endocrinology

## 2018-12-29 NOTE — Progress Notes (Signed)
ADVANCED HF CLINIC CONSULT NOTE  Referring Physician: Dr. Harl Bowie Primary Care: Mickie Hillier Primary Cardiologist: Harl Bowie  HPI:  Tara Bryant is a 48 y/o with ho systolic HF due to LVNC referred by Dr. Harl Bowie for HF Consultation.   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  Feels pretty good. Denies CP or SOB. Not overly active. Can do ADLs and go to store without much problem. Gets SOB if she walks up hills. Walked 3.6 miles with her daughter last week. No problems with medicines. Occasioanl ankle edema. DM2 not well controlled. Follows with Dr. Dorris Fetch in Endo.   Review of Systems: [y] = yes, [ ]  = no   General: Weight gain [ ] ; Weight loss [ ] ; Anorexia [ ] ; Fatigue [ ] ; Fever [ ] ; Chills [ ] ; Weakness [ ]   Cardiac: Chest pain/pressure [ ] ; Resting SOB [ ] ; Exertional SOB Blue.Reese ]; Orthopnea [ ] ; Pedal Edema Blue.Reese ]; Palpitations [ ] ; Syncope [ ] ; Presyncope [ ] ; Paroxysmal nocturnal dyspnea[ ]   Pulmonary: Cough [ ] ; Wheezing[ ] ; Hemoptysis[ ] ; Sputum [ ] ; Snoring [ ]   GI: Vomiting[ ] ; Dysphagia[ ] ; Melena[ ] ; Hematochezia [ ] ; Heartburn[ ] ; Abdominal pain [ ] ; Constipation [ ] ; Diarrhea [ ] ; BRBPR [ ]   GU: Hematuria[ ] ; Dysuria [ ] ; Nocturia[ ]   Vascular: Pain in legs with walking [ ] ; Pain in feet with lying flat [ ] ; Non-healing sores [ ] ; Stroke [ ] ; TIA [ ] ; Slurred speech [ ] ;  Neuro: Headaches[ ] ;  Vertigo[ ] ; Seizures[ ] ; Paresthesias[ ] ;Blurred vision [ ] ; Diplopia [ ] ; Vision changes [ ]   Ortho/Skin: Arthritis Blue.Reese ]; Joint pain [ y]; Muscle pain [ ] ; Joint swelling [ ] ; Back Pain [ ] ; Rash [ ]   Psych: Depression[ ] ; Anxiety[ ]   Heme: Bleeding problems [ ] ; Clotting disorders [ ] ; Anemia [ ]   Endocrine: Diabetes [ ] ; Thyroid dysfunction[ ]    Past Medical History:  Diagnosis Date  . Arrhythmia   . CAD (coronary artery disease)    a. 03/2018: cath showing 60% mid-LAD stenosis not significant by FFR.   Marland Kitchen Chronic combined systolic (congestive) and diastolic (congestive) heart failure ([place])    a. 03/2018: found to have a newly reduced EF of 20% by echocardiogram  . Depression   . Diabetes mellitus   . Hypertension   . Microproteinuria     Current Outpatient Medications  Medication Sig Dispense Refill  . apixaban (ELIQUIS) 5 MG TABS tablet Take 1 tablet (5 mg total) by mouth 2 (two) times daily. 180 tablet 3  . atorvastatin (LIPITOR) 10 MG tablet Take 10 mg by mouth daily.    . B-D ULTRAFINE III SHORT PEN 31G X 8 MM MISC USE FOR INSULIN INJECTIONS TWICE DAILY 100 each 5  . famotidine (PEPCID) 20 MG tablet TAKE 1 TABLET(20 MG) BY MOUTH TWICE DAILY (Patient taking differently: Take 20 mg by mouth 2 (two) times daily. ) 60 tablet 5  . furosemide (  LASIX) 40 MG tablet Take 1 tablet (40 mg total) by mouth daily. 90 tablet 2  . glipiZIDE (GLUCOTROL) 5 MG tablet Take 2.5 mg by mouth 2 (two) times daily before a meal.    . glucose blood (CONTOUR NEXT TEST) test strip USE TO TEST BLOOD SUGAR TWICE DAILY AS DIRECTED 100 each 3  . Insulin Glargine (BASAGLAR KWIKPEN) 100 UNIT/ML SOPN INJECT 80 UNITS UNDER THE SKIN INTO THE SKIN AT BEDTIME 15 mL 2  . metFORMIN (GLUCOPHAGE) 500 MG tablet Take 1 tablet (500 mg total) by mouth 2 (two) times daily with a meal. Call office to schedule appointment 180 tablet 0  . metoprolol succinate (TOPROL-XL) 100 MG 24 hr tablet Take 1&1/2 tablets (150 mg) by mouth  daily. Take with or immediately following a meal. 135 tablet 1  . NORLYDA 0.35 MG tablet Take 1 tablet by mouth daily.   12  . sacubitril-valsartan (ENTRESTO) 49-51 MG Take 1 tablet by mouth 2 (two) times daily. 60 tablet 6   No current facility-administered medications for this encounter.     Allergies  Allergen Reactions  . Aspartame And Phenylalanine Dermatitis      Social History   Socioeconomic History  . Marital status: Married    Spouse name: Not on file  . Number of children: Not on file  . Years of education: Not on file  . Highest education level: Not on file  Occupational History  . Not on file  Social Needs  . Financial resource strain: Not on file  . Food insecurity    Worry: Not on file    Inability: Not on file  . Transportation needs    Medical: Not on file    Non-medical: Not on file  Tobacco Use  . Smoking status: Never Smoker  . Smokeless tobacco: Never Used  Substance and Sexual Activity  . Alcohol use: No  . Drug use: No  . Sexual activity: Yes    Birth control/protection: Pill  Lifestyle  . Physical activity    Days per week: Not on file    Minutes per session: Not on file  . Stress: Not on file  Relationships  . Social Herbalist on phone: Not on file    Gets together: Not on file    Attends religious service: Not on file    Active member of club or organization: Not on file    Attends meetings of clubs or organizations: Not on file    Relationship status: Not on file  . Intimate partner violence    Fear of current or ex partner: Not on file    Emotionally abused: Not on file    Physically abused: Not on file    Forced sexual activity: Not on file  Other Topics Concern  . Not on file  Social History Narrative  . Not on file      Family History  Problem Relation Age of Onset  . Stroke Sister 53       smoker, diabetic, HTN  . Breast cancer Sister   . Cancer Mother        breast  . Breast cancer Mother   .  Hypertension Mother   . Diabetes Other   . Heart attack Other   . Chronic Renal Failure Father     Vitals:   12/30/18 1526  BP: (!) 156/90  Pulse: 97  SpO2: 98%  Weight: 90.9 kg (200 lb 6.4 oz)    PHYSICAL EXAM:  General:  Well appearing. No respiratory difficulty HEENT: normal Neck: supple. no JVD. Carotids 2+ bilat; no bruits. No lymphadenopathy or thryomegaly appreciated. Cor: PMI nondisplaced. Regular rate & rhythm. No rubs, gallops or murmurs. Lungs: clear Abdomen: soft, nontender, nondistended. No hepatosplenomegaly. No bruits or masses. Good bowel sounds. Extremities: no cyanosis, clubbing, rash, 1+ edema Neuro: alert & oriented x 3, cranial nerves grossly intact. moves all 4 extremities w/o difficulty. Affect pleasant.  ECG: 10/19/18 STach 116 qrs 86 LVH with repol. 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 (due to thrombus risk, no thrombus noted on MRI) - 08/2018 echo: LVEF 25-30%, grade II diastolic dysfunction, mild RV dysfunction - 10/29/18 ICD implantBoston scientific  - her 48 year old was screened for noncompaction by pediatric  workup was negative. - NYHA II - Volume status elevated. Increase lasix 60 daily - Increase entresto to 97/103 bid - Continue Toprol 150 daily - Consider spiro at next visit if K ok  - Also consider Wilder Glade - F/u in 13 weeks with HF PharmD for several visits to titrate meds - Discussed need for Genetics testing and possible need for advanced therapies down the road.   2. Pulmonary hypertension - Who Group II due to increased L hear pressures  3. CAD - moderate nonobstructive disease by cath  -no s/s ischemia  4. DM2 - consider SGLT2i  5. OSA - she reports tested prior to 2012, she is unsure of results - +snoring, unsure of apneic episodes, +daytime somnolence.  - will need home sleeps study   Glori Bickers, MD  11:22 PM

## 2018-12-30 ENCOUNTER — Other Ambulatory Visit: Payer: Self-pay

## 2018-12-30 ENCOUNTER — Ambulatory Visit (HOSPITAL_COMMUNITY)
Admission: RE | Admit: 2018-12-30 | Discharge: 2018-12-30 | Disposition: A | Discharge status: Home / Self Care | Payer: PRIVATE HEALTH INSURANCE | Source: Ambulatory Visit | Attending: Internal Medicine | Admitting: Internal Medicine

## 2018-12-30 ENCOUNTER — Encounter (HOSPITAL_COMMUNITY): Payer: Self-pay | Admitting: Internal Medicine

## 2018-12-30 VITALS — BP 156/90 | HR 97 | Wt 200.4 lb

## 2018-12-30 DIAGNOSIS — G4733 Obstructive sleep apnea (adult) (pediatric): Secondary | ICD-10-CM | POA: Insufficient documentation

## 2018-12-30 DIAGNOSIS — Z794 Long term (current) use of insulin: Secondary | ICD-10-CM | POA: Diagnosis not present

## 2018-12-30 DIAGNOSIS — I110 Hypertensive heart disease with heart failure: Secondary | ICD-10-CM | POA: Insufficient documentation

## 2018-12-30 DIAGNOSIS — Z79899 Other long term (current) drug therapy: Secondary | ICD-10-CM | POA: Diagnosis not present

## 2018-12-30 DIAGNOSIS — I5042 Chronic combined systolic (congestive) and diastolic (congestive) heart failure: Secondary | ICD-10-CM | POA: Diagnosis present

## 2018-12-30 DIAGNOSIS — E139 Other specified diabetes mellitus without complications: Secondary | ICD-10-CM | POA: Diagnosis not present

## 2018-12-30 DIAGNOSIS — Z8249 Family history of ischemic heart disease and other diseases of the circulatory system: Secondary | ICD-10-CM | POA: Diagnosis not present

## 2018-12-30 DIAGNOSIS — I272 Pulmonary hypertension, unspecified: Secondary | ICD-10-CM | POA: Insufficient documentation

## 2018-12-30 DIAGNOSIS — I2510 Atherosclerotic heart disease of native coronary artery without angina pectoris: Secondary | ICD-10-CM

## 2018-12-30 DIAGNOSIS — I428 Other cardiomyopathies: Secondary | ICD-10-CM | POA: Diagnosis not present

## 2018-12-30 DIAGNOSIS — I5022 Chronic systolic (congestive) heart failure: Secondary | ICD-10-CM | POA: Diagnosis not present

## 2018-12-30 DIAGNOSIS — Z7901 Long term (current) use of anticoagulants: Secondary | ICD-10-CM | POA: Insufficient documentation

## 2018-12-30 LAB — BASIC METABOLIC PANEL
Anion gap: 11 (ref 5–15)
BUN: 14 mg/dL (ref 6–20)
CO2: 24 mmol/L (ref 22–32)
Calcium: 9.1 mg/dL (ref 8.9–10.3)
Chloride: 103 mmol/L (ref 98–111)
Creatinine, Ser: 0.77 mg/dL (ref 0.44–1.00)
GFR calc Af Amer: >60 mL/min (ref >60)
GFR calc non Af Amer: >60 mL/min (ref >60)
Glucose, Bld: 230 mg/dL — ABNORMAL HIGH (ref 70–99)
Potassium: 4.3 mmol/L (ref 3.5–5.1)
Sodium: 138 mmol/L (ref 135–145)

## 2018-12-30 LAB — BRAIN NATRIURETIC PEPTIDE: B Natriuretic Peptide: 28.1 pg/mL (ref 0.0–100.0)

## 2018-12-30 MED ORDER — ENTRESTO 97-103 MG PO TABS: 1.0000 | ORAL_TABLET | Freq: Two times a day (BID) | ORAL | 5 refills | Status: DC

## 2018-12-30 MED ORDER — FUROSEMIDE 40 MG PO TABS: 60.0000 mg | ORAL_TABLET | Freq: Every day | ORAL | 2 refills | Status: DC

## 2018-12-30 NOTE — Patient Instructions (Addendum)
Labs done today. We will contact you only if your labs are abnormal.  INCREASE Lasix 60mg (1 tab) by mouth once daily  INCREASE Entresto 97/103(1 tab) by mouth two times daily  You have been referred to Dr. Broadus John at [facility] testing. They will contact you about an appointment date and time.  Your physician recommends that you schedule a follow-up appointment in: 3-4 months. We will contact you to schedule an appointment.  At the [facility], you and your health needs are our priority. As part of our continuing mission to provide you with exceptional heart care, we have created designated Provider Care Teams. These Care Teams include your primary Cardiologist (physician) and Advanced Practice Providers (APPs- Physician Assistants and Nurse Practitioners) who all work together to provide you with the care you need, when you need it.   You may see any of the following providers on your designated Care Team at your next follow up: Marland Kitchen Dr Glori Bickers . Dr Loralie Champagne . Darrick Grinder, NP   Please be sure to bring in all your medications bottles to every appointment.

## 2019-01-01 ENCOUNTER — Telehealth (HOSPITAL_COMMUNITY): Payer: Self-pay | Admitting: Pharmacist

## 2019-01-01 ENCOUNTER — Telehealth: Payer: Self-pay

## 2019-01-01 NOTE — Telephone Encounter (Signed)
Advanced Heart Failure Patient Advocate Encounter  Prior Authorization for Delene Loll has been approved.    PA# [id number] Effective dates: 01/01/19 through 12/31/2019  Patients co-pay is $0.00  Audry Riles, PharmD, BCPS, CPP Heart Failure Clinic Pharmacist [phone]

## 2019-01-01 NOTE — Telephone Encounter (Signed)
Patient Advocate Encounter   Received notification from [employer] that prior authorization for Tara Bryant is required.   PA submitted on CoverMyMeds Key [id number] Status is pending   Will continue to follow.  Audry Riles, PharmD, BCPS, CPP Heart Failure Clinic Pharmacist [phone]

## 2019-01-01 NOTE — Telephone Encounter (Signed)
Prior Auth for [employer] Tab 97-103 has been approved. 60 Tabs a month 730 tabs 12 months

## 2019-01-04 ENCOUNTER — Telehealth (HOSPITAL_COMMUNITY): Payer: Self-pay | Admitting: Pharmacy Technician

## 2019-01-04 NOTE — Telephone Encounter (Signed)
Advanced Heart Failure Patient Advocate Encounter  Prior Authorization for Entresto 97-103mg  has been approved.     Effective dates: 01/01/2019 through 01/01/2020  Patients co-pay is $0  Patient already has a paid claim at [facility].  Charlann Boxer, CPhT'

## 2019-01-06 ENCOUNTER — Other Ambulatory Visit: Payer: Self-pay | Admitting: "Endocrinology

## 2019-01-07 ENCOUNTER — Ambulatory Visit: Payer: PRIVATE HEALTH INSURANCE | Admitting: Cardiology

## 2019-01-07 NOTE — Progress Notes (Deleted)
Clinical Summary Tara Bryant is a 48 y.o.female  seen today for follow up of the following medical problems.   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  - 08/2018 echo: LVEF 25-30%, grade II diastolic dysfunction, mild RV dysfunction - 10/29/18 ICD implantBoston scientific     - followed in HF clinic. Lasix was increased to 60mg  daily, entresto increased to 97/103mg  bid with plans to consider aldactone if K remains stable.    2. Pulmonary hypertension - type II, her mean PA was 45 and PCWP 41 in setting of severel volume overload. Moderate RV dysfunction by MRI  3. CAD - moderate nonobstructive disease by cath -denies any chest pain   4. Sinus tachcyardia - resolved since last visit. Negative workup. She has had prior visits with sinus tach of unclear etiology.    5. OSA - she reports tested prior to 2012, she is unsure of results - +snoring, unsure of apneic episodes, +daytime somnolence.    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 Arrhythmia   . CAD (coronary artery disease)    a. 03/2018: cath showing 60% mid-LAD stenosis not significant by FFR.   Marland Kitchen Chronic combined systolic (congestive) and diastolic (congestive) heart failure ([place])    a. 03/2018: found to have a newly reduced EF of 20% by echocardiogram  . Depression   . Diabetes mellitus   .  Hypertension   . Microproteinuria      Allergies  Allergen Reactions  . Aspartame And Phenylalanine Dermatitis     Current Outpatient Medications  Medication Sig Dispense Refill  . apixaban (ELIQUIS) 5 MG TABS tablet Take 1 tablet (5 mg total) by mouth 2 (two) times daily. 180 tablet 3  . atorvastatin (LIPITOR) 10 MG tablet Take 10 mg by mouth daily.    . B-D ULTRAFINE III SHORT PEN 31G X 8 MM MISC USE FOR INSULIN INJECTIONS TWICE DAILY 100 each 5  . famotidine (PEPCID) 20 MG tablet TAKE 1 TABLET(20 MG) BY MOUTH TWICE DAILY (Patient taking differently: Take 20 mg by mouth 2 (two) times daily. ) 60 tablet 5  . furosemide (LASIX) 40 MG tablet Take 1.5 tablets (60 mg total) by mouth daily. 45 tablet 2  . glipiZIDE (GLUCOTROL) 5 MG tablet Take 2.5 mg by mouth 2 (two) times daily before a meal.    . glucose blood (CONTOUR NEXT TEST) test strip USE TO TEST BLOOD SUGAR TWICE DAILY AS DIRECTED 100 each 3  . Insulin Glargine (BASAGLAR KWIKPEN) 100 UNIT/ML SOPN INJECT 80 UNITS UNDER THE SKIN INTO THE SKIN AT BEDTIME 15 mL 2  . metFORMIN (GLUCOPHAGE) 500 MG tablet TAKE 1 TABLET(500 MG) BY MOUTH TWICE DAILY WITH A MEAL 180 tablet 0  . metoprolol succinate (TOPROL-XL) 100 MG 24 hr tablet Take 1&1/2 tablets (150 mg) by mouth daily. Take with or immediately following a meal. 135 tablet 1  .  NORLYDA 0.35 MG tablet Take 1 tablet by mouth daily.   12  . sacubitril-valsartan (ENTRESTO) 97-103 MG Take 1 tablet by mouth 2 (two) times daily. 60 tablet 5   No current facility-administered medications for this visit.      Past Surgical History:  Procedure Laterality Date  . BACK SURGERY    . BREAST BIOPSY Left 2017   benign  . ICD IMPLANT N/A 10/29/2018   Procedure: ICD IMPLANT;  Surgeon: Evans Lance, MD;  Location: [place] [facility];  Service: Cardiovascular;  Laterality: N/A;  . INTRAVASCULAR PRESSURE WIRE/FFR STUDY N/A 03/24/2018   Procedure: INTRAVASCULAR PRESSURE WIRE/FFR STUDY;  Surgeon:  Leonie Man, MD;  Location: [place] [facility];  Service: Cardiovascular;  Laterality: N/A;  . NOSE SURGERY    . RIGHT/LEFT HEART CATH AND CORONARY ANGIOGRAPHY N/A 03/24/2018   Procedure: RIGHT/LEFT HEART CATH AND CORONARY ANGIOGRAPHY;  Surgeon: Leonie Man, MD;  Location: [place] [facility];  Service: Cardiovascular;  Laterality: N/A;  . TONSILLECTOMY    . WISDOM TOOTH EXTRACTION       Allergies  Allergen Reactions  . Aspartame And Phenylalanine Dermatitis      Family History  Problem Relation Age of Onset  . Stroke Sister 74       smoker, diabetic, HTN  . Breast cancer Sister   . Cancer Mother        breast  . Breast cancer Mother   . Hypertension Mother   . Diabetes Other   . Heart attack Other   . Chronic Renal Failure Father      Social History Ms. Schafer reports that she has never smoked. She has never used smokeless tobacco. Ms. Mischel reports no history of alcohol use.   Review of Systems CONSTITUTIONAL: No weight loss, fever, chills, weakness or fatigue.  HEENT: Eyes: No visual loss, blurred vision, double vision or yellow sclerae.No hearing loss, sneezing, congestion, runny nose or sore throat.  SKIN: No rash or itching.  CARDIOVASCULAR:  RESPIRATORY: No shortness of breath, cough or sputum.  GASTROINTESTINAL: No anorexia, nausea, vomiting or diarrhea. No abdominal pain or blood.  GENITOURINARY: No burning on urination, no polyuria NEUROLOGICAL: No headache, dizziness, syncope, paralysis, ataxia, numbness or tingling in the extremities. No change in bowel or bladder control.  MUSCULOSKELETAL: No muscle, back pain, joint pain or stiffness.  LYMPHATICS: No enlarged nodes. No history of splenectomy.  PSYCHIATRIC: No history of depression or anxiety.  ENDOCRINOLOGIC: No reports of sweating, cold or heat intolerance. No polyuria or polydipsia.  Marland Kitchen   Physical Examination There were no vitals filed for this visit. There were no vitals filed for  this visit.  Gen: resting comfortably, no acute distress HEENT: no scleral icterus, pupils equal round and reactive, no palptable cervical adenopathy,  CV Resp: Clear to auscultation bilaterally GI: abdomen is soft, non-tender, non-distended, normal bowel sounds, no hepatosplenomegaly MSK: extremities are warm, no edema.  Skin: warm, no rash Neuro:  no focal deficits Psych: appropriate affect   Diagnostic Studies 03/2018 echo LVEF 20%, diffuse hypokinesis, cannot eval diastolic function, mild RV dysfunction, PASP 47, small pericardial effusion  03/2018 LHC/RHC  Prox LAD lesion is 55% stenosed. DFR 0.92  Otherwise angiographically normal coronary arteries.  Hemodynamic findings consistent with severe pulmonary hypertension.  LV end diastolic pressure is severely elevated.  SUMMARY:  Moderate single-vessel disease with roughly 60% mid LAD (DFR 0.92 -not physiologically significant)  Otherwise minimal CAD.  Severely elevated pulmonary wedge pressure and LVEDP  consistent with secondary pulmonary pretension from LV failure (ACUTE COMBINED SYSTOLIC AND DIASTOLIC HEART FAILURE)  Mild-moderately reduced cardiac output/index. (Output 4.48, Index 2.2.)   03/2018 cardiac MRI FINDINGS: 1. Moderately dilated left ventricle with mild concentric hypertrophy and severe systolic function (LVEF = 27%). Severe diffuse hypokinesis and paradoxical septal motion. There is no late gadolinium enhancement in the left ventricular myocardium. There is significant hyper trabeculation of the left ventricle with ratio of non-compacted to compacted myocardium > 4:1.  LVEDD: 61 mm  LVESD: 51 mm  LVEDV: 170 ml  LVESV: 124 ml  SV: 46 ml  CO: 4.6 L/min  Myocardial mass: 166 g  2. Normal right ventricular size, thickness and moderate systolic dysfunction (LVEF = 30%). There are no regional wall motion abnormalities.  3. Normal left and right atrial size.  4. Normal size  of the aortic root, ascending aorta and pulmonary artery.  5. Trivial mitral and mild tricuspid regurgitation.  6. Normal pericardium. Mild circumferential pericardial effusion, moderate amount inferior to the heart (up to 11 mm).  IMPRESSION: 1. Moderately dilated left ventricle with mild concentric hypertrophy and severe systolic function (LVEF = 27%). Severe diffuse hypokinesis and paradoxical septal motion. There is no late gadolinium enhancement in the left ventricular myocardium. There is significant hyper trabeculation of the left ventricle with ratio of non-compacted to compacted myocardium > 4:1.  2. Normal right ventricular size, thickness and moderate systolic dysfunction (LVEF = 30%). There are no regional wall motion abnormalities.  3. Normal left and right atrial size.  4. Normal size of the aortic root, ascending aorta and pulmonary artery.  5. Trivial mitral and mild tricuspid regurgitation.  6. Normal pericardium. Mild circumferential pericardial effusion, moderate amount inferior to the heart (up to 11 mm).  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   08/2018 echo IMPRESSIONS   1. The left ventricle has severely reduced systolic function, with an ejection fraction of 25-30%. The cavity size was normal. There is mild concentric left ventricular hypertrophy. Left ventricular diastolic Doppler parameters are consistent with  pseudonormalization. Elevated left ventricular end-diastolic pressure Left ventricular diffuse hypokinesis. 2. The left ventricular apex appears trabeculated. 3. The right ventricle has mildly reduced systolic function. The cavity was normal. There is mildly increased right ventricular wall thickness. 4. The mitral valve is grossly normal. Mild thickening of the mitral valve leaflet. Mitral valve regurgitation is mild to moderate by color flow  Doppler. 5. The tricuspid valve is grossly normal. 6. The aortic valve is grossly normal. Mild thickening of the aortic valve. 7. The aortic root is normal in size and structure.    Assessment and Plan  1.Chronic systolic HF/Noncompaction cardiomyopathy - NICM, noncompaction CM by MRI - medical therapy previously limited by low bp's, her bps have trended upwardand we have been titrating up her meds. - has been on anticoag in setting of noncompaction and low LVEF, continue at this time.  - increase toprol to 150mg  daily.  - we have referred to CHF clinic. Young patient with limited comorbidities, would want her plugged in early in case advanced therapies become indicated.  - refer to Dr Luan Pulling for OSA evaluation   F/u 2 months       Arnoldo Lenis, M.D.

## 2019-01-12 ENCOUNTER — Other Ambulatory Visit (HOSPITAL_BASED_OUTPATIENT_CLINIC_OR_DEPARTMENT_OTHER): Payer: Self-pay

## 2019-01-12 DIAGNOSIS — G4710 Hypersomnia, unspecified: Secondary | ICD-10-CM

## 2019-01-12 DIAGNOSIS — R0683 Snoring: Secondary | ICD-10-CM

## 2019-01-12 DIAGNOSIS — R5383 Other fatigue: Secondary | ICD-10-CM

## 2019-01-12 DIAGNOSIS — G4730 Sleep apnea, unspecified: Secondary | ICD-10-CM

## 2019-01-19 ENCOUNTER — Other Ambulatory Visit: Payer: Self-pay

## 2019-01-19 ENCOUNTER — Other Ambulatory Visit (HOSPITAL_COMMUNITY)
Admission: RE | Admit: 2019-01-19 | Discharge: 2019-01-19 | Disposition: A | Discharge status: Home / Self Care | Payer: PRIVATE HEALTH INSURANCE | Source: Ambulatory Visit | Attending: Neurology | Admitting: Neurology

## 2019-01-19 DIAGNOSIS — Z01812 Encounter for preprocedural laboratory examination: Secondary | ICD-10-CM | POA: Insufficient documentation

## 2019-01-19 DIAGNOSIS — Z20828 Contact with and (suspected) exposure to other viral communicable diseases: Secondary | ICD-10-CM | POA: Diagnosis not present

## 2019-01-19 LAB — SARS CORONAVIRUS 2 (TAT 6-24 HRS): SARS Coronavirus 2: NEGATIVE

## 2019-01-20 ENCOUNTER — Other Ambulatory Visit: Payer: Self-pay

## 2019-01-20 ENCOUNTER — Ambulatory Visit (HOSPITAL_COMMUNITY)
Admission: RE | Admit: 2019-01-20 | Discharge: 2019-01-20 | Disposition: A | Discharge status: Home / Self Care | Payer: PRIVATE HEALTH INSURANCE | Source: Ambulatory Visit | Attending: Cardiology | Admitting: Cardiology

## 2019-01-20 VITALS — BP 144/80 | HR 100 | Wt 199.2 lb

## 2019-01-20 DIAGNOSIS — I5022 Chronic systolic (congestive) heart failure: Secondary | ICD-10-CM

## 2019-01-20 DIAGNOSIS — I272 Pulmonary hypertension, unspecified: Secondary | ICD-10-CM | POA: Insufficient documentation

## 2019-01-20 DIAGNOSIS — Z794 Long term (current) use of insulin: Secondary | ICD-10-CM | POA: Diagnosis not present

## 2019-01-20 DIAGNOSIS — G4733 Obstructive sleep apnea (adult) (pediatric): Secondary | ICD-10-CM | POA: Insufficient documentation

## 2019-01-20 DIAGNOSIS — Z7901 Long term (current) use of anticoagulants: Secondary | ICD-10-CM | POA: Insufficient documentation

## 2019-01-20 DIAGNOSIS — I2510 Atherosclerotic heart disease of native coronary artery without angina pectoris: Secondary | ICD-10-CM | POA: Insufficient documentation

## 2019-01-20 DIAGNOSIS — I428 Other cardiomyopathies: Secondary | ICD-10-CM | POA: Insufficient documentation

## 2019-01-20 DIAGNOSIS — E118 Type 2 diabetes mellitus with unspecified complications: Secondary | ICD-10-CM | POA: Insufficient documentation

## 2019-01-20 LAB — BASIC METABOLIC PANEL
Anion gap: 11 (ref 5–15)
BUN: 9 mg/dL (ref 6–20)
CO2: 23 mmol/L (ref 22–32)
Calcium: 8.5 mg/dL — ABNORMAL LOW (ref 8.9–10.3)
Chloride: 104 mmol/L (ref 98–111)
Creatinine, Ser: 0.83 mg/dL (ref 0.44–1.00)
GFR calc Af Amer: >60 mL/min (ref >60)
GFR calc non Af Amer: >60 mL/min (ref >60)
Glucose, Bld: 254 mg/dL — ABNORMAL HIGH (ref 70–99)
Potassium: 4.1 mmol/L (ref 3.5–5.1)
Sodium: 138 mmol/L (ref 135–145)

## 2019-01-20 MED ORDER — SPIRONOLACTONE 25 MG PO TABS: 25.0000 mg | ORAL_TABLET | Freq: Every day | ORAL | 3 refills | Status: DC

## 2019-01-20 NOTE — Patient Instructions (Addendum)
It was a pleasure seeing you today!  MEDICATIONS: -We are changing your medications today -Start spironolactone 25 mg (1 tab) daily. -I will reach out to Dr. Dorris Fetch about starting Jardiance 25 mg daily for your heart failure and diabetes.  -Call if you have questions about your medications.  LABS: -We will call you if your labs need attention. -Ask Dr. Dorris Fetch to get a BMET next week to check your potassium level.   NEXT APPOINTMENT: Return to clinic in 4 weeks with Pharmacy Clinic.  In general, to take care of your heart failure: -Limit your fluid intake to 2 Liters (half-gallon) per day.   -Limit your salt intake to ideally 2-3 grams (2000-3000 mg) per day. -Weigh yourself daily and record, and bring that "weight diary" to your next appointment.  (Weight gain of 2-3 pounds in 1 day typically means fluid weight.) -The medications for your heart are to help your heart and help you live longer.   -Please contact us before stopping any of your heart medications.  Call the clinic at [phone] with questions or to reschedule future appointments.

## 2019-01-21 ENCOUNTER — Ambulatory Visit: Payer: PRIVATE HEALTH INSURANCE | Attending: Pulmonary Disease | Admitting: Neurology

## 2019-01-21 DIAGNOSIS — R5383 Other fatigue: Secondary | ICD-10-CM

## 2019-01-21 DIAGNOSIS — Z79899 Other long term (current) drug therapy: Secondary | ICD-10-CM | POA: Insufficient documentation

## 2019-01-21 DIAGNOSIS — G4730 Sleep apnea, unspecified: Secondary | ICD-10-CM | POA: Diagnosis present

## 2019-01-21 DIAGNOSIS — G4733 Obstructive sleep apnea (adult) (pediatric): Secondary | ICD-10-CM | POA: Diagnosis not present

## 2019-01-21 DIAGNOSIS — Z7901 Long term (current) use of anticoagulants: Secondary | ICD-10-CM | POA: Diagnosis not present

## 2019-01-21 DIAGNOSIS — G4710 Hypersomnia, unspecified: Secondary | ICD-10-CM | POA: Diagnosis present

## 2019-01-21 DIAGNOSIS — Z794 Long term (current) use of insulin: Secondary | ICD-10-CM | POA: Diagnosis not present

## 2019-01-21 DIAGNOSIS — R0683 Snoring: Secondary | ICD-10-CM | POA: Diagnosis present

## 2019-01-24 NOTE — Progress Notes (Signed)
Referring Physician: Dr. Harl Bowie Primary Care: Mickie Hillier Primary Cardiologist: Harl Bowie HF Cardiologist: Haroldine Laws  HPI:  Tara Bryant is a 48 y/o with a history of systolic HF due to [place] referred by Dr. Harl Bowie for HF Consultation.   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, workup was negative.  She recently presented to HF clinic for follow-up with Dr. Haroldine Laws. At that visit, she reported feeling pretty good. She denied CP or SOB, but is not overly active. She could do all ADLs and go to store without much problem. She did report getting SOB if she walks up hills. She walked 3.6 miles with her daughter last week. No problems with medicines. Occasional ankle edema. Her T2DM is not well controlled. Follows with Dr. Dorris Fetch in Endo.   Today she returns to HF clinic for pharmacist medication titration. At last visit with MD, Delene Loll was increased to 97/103 mg BID and furosemide was increased to 60 mg daily due to elevated volume status. Overall she is feeling well today, although she does note she is stressed trying to coordinate her daughter's virtual schooling. No dizziness, lightheadedness, chest pain or palpitations. Her breathing is good, she can walk 3-4 miles and not become short of breath. She is able to perform all ADLs. She does report some fatigue, but that is long-standing and doesn't "affect her too much". She does  not weigh herself daily at home, but states that she normally weighs 182-185 lbs. She takes furosemide 60 mg daily. She last took an extra furosemide tablet approximately 3 weeks ago when her ankles were more swollen. She has 1+ pitting edema in there ankles today, which is not uncommon for her. No PND/orthopnea. ReDS reading 42%; however, result may be falsely high given the curvature of her back prevents vest from sitting flush. Her appetite is "good but not great", which she attributes to stress. She does not follow a low-salt diet.     . Shortness of breath/dyspnea on exertion? no  . Orthopnea/PND? no . Edema? Yes - 1+ ankle edema (baseline for her); could benefit from compression stockings . Lightheadedness/dizziness? no . Daily weights at home? no . Blood pressure/heart rate monitoring at home? no . Following low-sodium/fluid-restricted diet? no  HF Medications: Metoprolol succinate 150 mg daily Entresto 97/103 mg BID Furosemide 60 mg daily  Has the patient been experiencing any side effects to the medications prescribed?  no  Does the patient have any problems obtaining medications due to transportation or finances?   No - Has commercial insurance through [employer]  Understanding of regimen: good Understanding of indications: fair Potential of compliance: good Patient understands to avoid NSAIDs. Patient understands to avoid decongestants.    Pertinent Lab Values: . Serum creatinine 0.83, BUN 9, Potassium 4.1, Sodium 138, BNP 28.1 (12/30/18), Hemoglobin A1C: 9.3% (09/29/18)    Vital Signs: . Weight: 199.2 labs (last clinic weight: 200.4 lbs) . Blood pressure: 144/80  . Heart rate: 100 . 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 - NYHA II  - Volume status slightly elevated on exam - ankle edema, ReDS 42 % (reading possibly falsely elevated as curvature of the spine prevented vest from sitting flush to her back).  - Vitals: BP 144/80, HR 100 - Labs reviewed in clinic today were stable- Scr 0.83. K 4.1 - Take furosemide 80 mg x2 days, then continue furosemide 60 mg daily. Recommended compression stockings. - Continue Entresto 97/103 mg BID - Continue metoprolol succinate 150 daily (last increased 11/13/18). Given elevated HR today, would consider increasing at next visit if euvolemic.  - Start spironolactone 25 mg daily. Repeat BMET in 1 week (to be done at Dr. Liliane Channel office to consolidate travel).  -Plan to start Jardiance at next visit for benefits in heart failure and diabetes. Have sent in-basket to Dr. Dorris Fetch (who manages her diabetes) regarding this plan. Benefits investigation: Jardiance copay $0.00; Wilder Glade requires PA and is non-preferred on her insurance plan.  -Plan for sleep study this month (01/21/2019) and meeting with genetics counselor.   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 CAD - moderate nonobstructive disease by cath -no s/s ischemia  4. DM2 - not well-controlled, A1C 9.3% (09/29/18) - Managed by Dr. Dorris Fetch at [facility] - Continue metformin 500 mg twice daily, glipizide 2.5 mg BID, and Insulin Glargine 80 units daily - Continue statin - Plan to start Jardiance at next visit for benefits in heart failure and diabetes. Have sent in-basket to Dr. Dorris Fetch (who manages her diabetes) regarding this plan.   5. OSA - she  reports tested prior to 2012, she is unsure of results - +snoring, unsure of apneic episodes, +daytime somnolence. - will need home sleep study   Plan: 1) Medication changes: Based on clinical presentation, vital signs and recent labs will start spironolactone 25 mg daily. Take furosemide 80 mg x2 days, then continue 60 mg daily. Start wearing compression stockings. Will repeat BMET in 1 week at her visit with Dr. Dorris Fetch to assess potassium with new spironolactone.  2) Labs: Scr 0.83, K 4.1 3) Follow-up: 4 weeks in [place], PharmD, BCPS, [facility] Pharmacist [phone]   Patient discussed with Dr. Lynelle Smoke.  Agree with assessment and plan.    Bonnita Nasuti Pharm.D. CPP, BCPS Clinical Pharmacist [phone] 01/24/2019 3:26 PM

## 2019-01-26 NOTE — Procedures (Signed)
[place] Merlene Laughter, MD     [email or web address]             NOCTURNAL POLYSOMNOGRAPHY   LOCATION: [facility]   Patient Name: Tara Bryant, Tara Bryant Date: 01/21/2019 Gender: Female D.O.B: October 09, 1970 Age (years): 48 Referring Provider: Sinda Du Height (inches): 68 Interpreting Physician: Phillips Odor MD, ABSM Weight (lbs): 195 RPSGT: Peak, Robert BMI: 30 MRN: [id number] Neck Size: 15.00 CLINICAL INFORMATION Sleep Study Type: NPSG     Indication for sleep study: Excessive Daytime Sleepiness, Fatigue, Snoring     Epworth Sleepiness Score: 9     SLEEP STUDY TECHNIQUE As per the AASM Manual for the Scoring of Sleep and Associated Events v2.3 (April 2016) with a hypopnea requiring 4% desaturations.  The channels recorded and monitored were frontal, central and occipital EEG, electrooculogram (EOG), submentalis EMG (chin), nasal and oral airflow, thoracic and abdominal wall motion, anterior tibialis EMG, snore microphone, electrocardiogram, and pulse oximetry.  MEDICATIONS Medications self-administered by patient taken the night of the study : N/A  Current Outpatient Medications:  .  apixaban (ELIQUIS) 5 MG TABS tablet, Take 1 tablet (5 mg total) by mouth 2 (two) times daily., Disp: 180 tablet, Rfl: 3 .  atorvastatin (LIPITOR) 10 MG tablet, Take 10 mg by mouth daily., Disp: , Rfl:  .  B-D ULTRAFINE III SHORT PEN 31G X 8 MM MISC, USE FOR INSULIN INJECTIONS TWICE DAILY, Disp: 100 each, Rfl: 5 .  furosemide (LASIX) 40 MG tablet, Take 1.5 tablets (60 mg total) by mouth daily., Disp: 45 tablet, Rfl: 2 .  glipiZIDE (GLUCOTROL) 5 MG tablet, Take 2.5 mg by mouth 2 (two) times daily before a meal., Disp: , Rfl:  .  glucose blood (CONTOUR NEXT TEST) test strip, USE TO TEST BLOOD SUGAR TWICE DAILY AS DIRECTED, Disp: 100 each, Rfl: 3 .  Insulin Glargine (BASAGLAR KWIKPEN) 100 UNIT/ML SOPN, INJECT 80 UNITS UNDER THE SKIN INTO THE SKIN AT BEDTIME, Disp:  15 mL, Rfl: 2 .  metFORMIN (GLUCOPHAGE) 500 MG tablet, TAKE 1 TABLET(500 MG) BY MOUTH TWICE DAILY WITH A MEAL, Disp: 180 tablet, Rfl: 0 .  metoprolol succinate (TOPROL-XL) 100 MG 24 hr tablet, Take 1&1/2 tablets (150 mg) by mouth daily. Take with or immediately following a meal., Disp: 135 tablet, Rfl: 1 .  NORLYDA 0.35 MG tablet, Take 1 tablet by mouth daily. , Disp: , Rfl: 12 .  sacubitril-valsartan (ENTRESTO) 97-103 MG, Take 1 tablet by mouth 2 (two) times daily., Disp: 60 tablet, Rfl: 5 .  spironolactone (ALDACTONE) 25 MG tablet, Take 1 tablet (25 mg total) by mouth daily., Disp: 90 tablet, Rfl: 3     SLEEP ARCHITECTURE The study was initiated at 10:08:09 PM and ended at 5:05:44 AM.  Sleep onset time was 49.7 minutes and the sleep efficiency was 83.5%%. The total sleep time was 348.5 minutes.  Stage REM latency was 146.0 minutes.  The patient spent 2.9%% of the night in stage N1 sleep, 78.0%% in stage N2 sleep, 11.3%% in stage N3 and 7.8% in REM.  Alpha intrusion was absent.  Supine sleep was 6.58%.  RESPIRATORY PARAMETERS The overall apnea/hypopnea index (AHI) was 17.6 per hour. There were 39 total apneas, including 39 obstructive, 0 central and 0 mixed apneas. There were 63 hypopneas and 115 RERAs.  The AHI during Stage REM sleep was 35.6 per hour.  AHI while supine was 36.7 per hour.  The mean oxygen saturation was 95.8%. The minimum SpO2 during sleep was 77.0%.  moderate snoring  was noted during this study.  CARDIAC DATA The 2 lead EKG demonstrated sinus rhythm. The mean heart rate was 72.2 beats per minute. Other EKG findings include: PVCs.  LEG MOVEMENT DATA The total PLMS were 0 with a resulting PLMS index of 0.0. Associated arousal with leg movement index was 0.0.  IMPRESSIONS 1.  Moderate obstructive sleep apnea syndrome worse during REM sleep is noted with this recording.  AutoPap 5-12 as recommended.    Delano Metz, MD Diplomate, American Board of Sleep  Medicine.  ELECTRONICALLY SIGNED ON:  01/26/2019, 10:23 AM [place] PH: [phone]   FX: [phone] [place]

## 2019-01-27 ENCOUNTER — Other Ambulatory Visit: Payer: Self-pay

## 2019-01-27 ENCOUNTER — Ambulatory Visit (INDEPENDENT_AMBULATORY_CARE_PROVIDER_SITE_OTHER): Payer: PRIVATE HEALTH INSURANCE | Admitting: "Endocrinology

## 2019-01-27 ENCOUNTER — Encounter: Payer: Self-pay | Admitting: "Endocrinology

## 2019-01-27 DIAGNOSIS — I10 Essential (primary) hypertension: Secondary | ICD-10-CM

## 2019-01-27 DIAGNOSIS — E1165 Type 2 diabetes mellitus with hyperglycemia: Secondary | ICD-10-CM | POA: Diagnosis not present

## 2019-01-27 DIAGNOSIS — E782 Mixed hyperlipidemia: Secondary | ICD-10-CM | POA: Diagnosis not present

## 2019-01-27 MED ORDER — GLIPIZIDE 5 MG PO TABS: ORAL_TABLET | ORAL | 2 refills | Status: DC

## 2019-01-27 NOTE — Progress Notes (Signed)
01/27/2019, 1:39 PM                                                    Endocrinology Telehealth Visit Follow up Note -During COVID -19 Pandemic  This visit type was conducted due to national recommendations for restrictions regarding the COVID-19 Pandemic  in an effort to limit this patient's exposure and mitigate transmission of the corona virus.  Due to her co-morbid illnesses, Tara Bryant is at  moderate to high risk for complications without adequate follow up.  This format is felt to be most appropriate for her at this time.  I connected with this patient on 01/27/2019   by telephone and verified that I am speaking with the correct person using two identifiers. Tara Bryant, [birth year]. she has verbally consented to this visit. All issues noted in this document were discussed and addressed. The format was not optimal for physical exam.    Subjective:    [name], female    DOB: [birth year].  Tara Bryant is being engaged in telehealth via telephone follow-up in  management of currently uncontrolled symptomatic diabetes requested by  Mikey Kirschner, MD.   Past Medical History:  Diagnosis Date  . Arrhythmia   . CAD (coronary artery disease)    a. 03/2018: cath showing 60% mid-LAD stenosis not significant by FFR.   Marland Kitchen Chronic combined systolic (congestive) and diastolic (congestive) heart failure ([place])    a. 03/2018: found to have a newly reduced EF of 20% by echocardiogram  . Depression   . Diabetes mellitus   . Hypertension   . Microproteinuria    Past Surgical History:  Procedure Laterality Date  . BACK SURGERY    . BREAST BIOPSY Left 2017   benign  . ICD IMPLANT N/A 10/29/2018   Procedure: ICD IMPLANT;  Surgeon: Evans Lance, MD;  Location: [facility];  Service: Cardiovascular;  Laterality: N/A;  . INTRAVASCULAR PRESSURE WIRE/FFR STUDY N/A  03/24/2018   Procedure: INTRAVASCULAR PRESSURE WIRE/FFR STUDY;  Surgeon: Leonie Man, MD;  Location: [facility];  Service: Cardiovascular;  Laterality: N/A;  . NOSE SURGERY    . RIGHT/LEFT HEART CATH AND CORONARY ANGIOGRAPHY N/A 03/24/2018   Procedure: RIGHT/LEFT HEART CATH AND CORONARY ANGIOGRAPHY;  Surgeon: Leonie Man, MD;  Location: [facility];  Service: Cardiovascular;  Laterality: N/A;  . TONSILLECTOMY    . WISDOM TOOTH EXTRACTION     Social History   Socioeconomic History  . Marital status: Married    Spouse name: Not on file  . Number of children: Not on file  . Years of education: Not on file  . Highest education level: Not on file  Occupational History  . Not on file  Social Needs  . Financial resource strain: Not on file  . Food insecurity    Worry: Not on file    Inability: Not on file  . Transportation needs  Medical: Not on file    Non-medical: Not on file  Tobacco Use  . Smoking status: Never Smoker  . Smokeless tobacco: Never Used  Substance and Sexual Activity  . Alcohol use: No  . Drug use: No  . Sexual activity: Yes    Birth control/protection: Pill  Lifestyle  . Physical activity    Days per week: Not on file    Minutes per session: Not on file  . Stress: Not on file  Relationships  . Social Herbalist on phone: Not on file    Gets together: Not on file    Attends religious service: Not on file    Active member of club or organization: Not on file    Attends meetings of clubs or organizations: Not on file    Relationship status: Not on file  Other Topics Concern  . Not on file  Social History Narrative  . Not on file   Outpatient Encounter Medications as of 01/27/2019  Medication Sig  . apixaban (ELIQUIS) 5 MG TABS tablet Take 1 tablet (5 mg total) by mouth 2 (two) times daily.  Marland Kitchen atorvastatin (LIPITOR) 10 MG tablet Take 10 mg by mouth daily.  . B-D ULTRAFINE III SHORT PEN 31G X 8 MM MISC USE FOR INSULIN  INJECTIONS TWICE DAILY  . furosemide (LASIX) 40 MG tablet Take 1.5 tablets (60 mg total) by mouth daily.  Marland Kitchen glipiZIDE (GLUCOTROL) 5 MG tablet Take 2 tabs with breakfast, 1 tab with supper  . glucose blood (CONTOUR NEXT TEST) test strip USE TO TEST BLOOD SUGAR TWICE DAILY AS DIRECTED  . Insulin Glargine (BASAGLAR KWIKPEN) 100 UNIT/ML SOPN INJECT 80 UNITS UNDER THE SKIN INTO THE SKIN AT BEDTIME  . metFORMIN (GLUCOPHAGE) 500 MG tablet TAKE 1 TABLET(500 MG) BY MOUTH TWICE DAILY WITH A MEAL  . metoprolol succinate (TOPROL-XL) 100 MG 24 hr tablet Take 1&1/2 tablets (150 mg) by mouth daily. Take with or immediately following a meal.  . NORLYDA 0.35 MG tablet Take 1 tablet by mouth daily.   . sacubitril-valsartan (ENTRESTO) 97-103 MG Take 1 tablet by mouth 2 (two) times daily.  Marland Kitchen spironolactone (ALDACTONE) 25 MG tablet Take 1 tablet (25 mg total) by mouth daily.  . [DISCONTINUED] glipiZIDE (GLUCOTROL) 5 MG tablet Take 2.5 mg by mouth 2 (two) times daily before a meal.   No facility-administered encounter medications on file as of 01/27/2019.     ALLERGIES: Allergies  Allergen Reactions  . Aspartame And Phenylalanine Dermatitis    VACCINATION STATUS: Immunization History  Administered Date(s) Administered  . Influenza Split 02/02/2013  . Influenza,inj,Quad PF,6+ Mos 01/11/2014, 12/15/2014, 12/15/2015, 01/03/2017, 01/28/2018  . Influenza-Unspecified 03/08/2011  . Pneumococcal Polysaccharide-23 03/30/2009  . Td 02/04/2006  . Tdap 02/26/2018    Diabetes She presents for her follow-up diabetic visit. She has type 2 diabetes mellitus. Onset time: She was diagnosed at approximate age of 48 years. Her disease course has been worsening. There are no hypoglycemic associated symptoms. Pertinent negatives for hypoglycemia include no confusion, headaches, pallor or seizures. Associated symptoms include fatigue, polydipsia and polyuria. Pertinent negatives for diabetes include no chest pain and no  polyphagia. There are no hypoglycemic complications. Symptoms are worsening. Diabetic complications include heart disease. Risk factors for coronary artery disease include diabetes mellitus, dyslipidemia, hypertension, family history and sedentary lifestyle. Current diabetic treatments: She is currently on Lantus 70 units nightly, glipizide 5 mg once a day, metformin 500 mg p.o. twice daily. Her weight is fluctuating minimally.  She is following a generally unhealthy diet. When asked about meal planning, she reported none. She has had a previous visit with a dietitian. She never participates in exercise. Her home blood glucose trend is increasing steadily. Her breakfast blood glucose range is generally 140-180 mg/dl. Her bedtime blood glucose range is generally >200 mg/dl. Her overall blood glucose range is >200 mg/dl. (She reports significantly above target glycemic profile: Fasting between 128-257, prelunch 131-254, presupper 96-292.   Her previsit labs show A1c of 9.3% increasing from 8.2%.  ) An ACE inhibitor/angiotensin II receptor blocker is not being taken. Eye exam is current.  Hyperlipidemia This is a chronic problem. The current episode started more than 1 year ago. Exacerbating diseases include diabetes. Pertinent negatives include no chest pain, myalgias or shortness of breath. Current antihyperlipidemic treatment includes statins. Risk factors for coronary artery disease include dyslipidemia, diabetes mellitus, hypertension, a sedentary lifestyle and family history.  Hypertension This is a chronic problem. The current episode started more than 1 year ago. Pertinent negatives include no chest pain, headaches, palpitations or shortness of breath. Risk factors for coronary artery disease include diabetes mellitus, dyslipidemia, family history and sedentary lifestyle. Past treatments include beta blockers. Hypertensive end-organ damage includes CAD/MI and heart failure.    Review of systems: Limited  as above.  Objective:    There were no vitals taken for this visit.  Wt Readings from Last 3 Encounters:  01/20/19 199 lb 3.2 oz (90.4 kg)  12/30/18 200 lb 6.4 oz (90.9 kg)  11/13/18 195 lb 3.2 oz (88.5 kg)      CMP ( most recent) CMP     Component Value Date/Time   NA 138 01/20/2019 1329   NA 138 04/03/2018 1240   K 4.1 01/20/2019 1329   CL 104 01/20/2019 1329   CO2 23 01/20/2019 1329   GLUCOSE 254 (H) 01/20/2019 1329   BUN 9 01/20/2019 1329   BUN 20 04/03/2018 1240   CREATININE 0.83 01/20/2019 1329   CREATININE 0.74 04/06/2014 0934   CALCIUM 8.5 (L) 01/20/2019 1329   PROT 7.4 10/20/2018 1111   PROT 6.5 03/26/2017 0916   ALBUMIN 3.9 10/20/2018 1111   ALBUMIN 4.0 03/26/2017 0916   AST 17 10/20/2018 1111   ALT 21 10/20/2018 1111   ALKPHOS 81 10/20/2018 1111   BILITOT 0.5 10/20/2018 1111   BILITOT 0.4 03/26/2017 0916   GFRNONAA >60 01/20/2019 1329   GFRAA >60 01/20/2019 1329     Diabetic Labs (most recent): Lab Results  Component Value Date   HGBA1C 9.3 (H) 09/29/2018   HGBA1C 8.2 (H) 03/25/2018   HGBA1C 7.4 (A) 01/15/2018     Lipid Panel ( most recent) Lipid Panel     Component Value Date/Time   CHOL 158 09/29/2018 1206   CHOL 202 (H) 03/26/2017 0916   TRIG 109 09/29/2018 1206   HDL 36 (L) 09/29/2018 1206   HDL 45 03/26/2017 0916   CHOLHDL 4.4 09/29/2018 1206   VLDL 22 09/29/2018 1206   LDLCALC 100 (H) 09/29/2018 1206   LDLCALC 134 (H) 03/26/2017 0916      Assessment & Plan:   1. Uncontrolled type 2 diabetes mellitus with hyperglycemia ([place])  - Tara Bryant has currently uncontrolled symptomatic type 2 DM since  48 years of age.  She reports significantly above target glycemic profile, fasting ranging between 122-169, bedtime ranging between 248-397 .she does not have previsit A1c, last visit A1c was 9.3% increasing from 8.2% .  -her diabetes is complicated  by coronary artery disease/CHF and she remains at a high risk for more acute and  chronic complications which include CAD, CVA, CKD, retinopathy, and neuropathy. These are all discussed in detail with her.  - I have counseled her on diet management and weight loss, by adopting a carbohydrate restricted/protein rich diet.  - she  admits there is a room for improvement in her diet and drink choices. -  Suggestion is made for her to avoid simple carbohydrates  from her diet including Cakes, Sweet Desserts / Pastries, Ice Cream, Soda (diet and regular), Sweet Tea, Candies, Chips, Cookies, Sweet Pastries,  Store Bought Juices, Alcohol in Excess of  1-2 drinks a day, Artificial Sweeteners, Coffee Creamer, and "Sugar-free" Products. This will help patient to have stable blood glucose profile and potentially avoid unintended weight gain.   - I encouraged her to switch to  unprocessed or minimally processed complex starch and increased protein intake (animal or plant source), fruits, and vegetables.  - she is advised to stick to a routine mealtimes to eat 3 meals  a day and avoid unnecessary snacks ( to snack only to correct hypoglycemia).   - she will be scheduled with Jearld Fenton, RDN, CDE for individualized diabetes education.  - I have approached her with the following individualized plan to manage diabetes and patient agrees:   -Given her recent a1c of 9.3%, and uncontrolled fasting glycemic profile, she will require treatment with a higher dose of medications.    -She would likely require basal/bolus insulin, however for now, she is advised to continue Basaglar 80 units nightly, start monitoring blood glucose 4 times a day-daily before meals and at bedtime and return in 2 weeks for reevaluation.    - she is encouraged to call clinic for blood glucose levels less than 70 or above 300 mg /dl. - she is advised to continue metformin 500 mg p.o. twice daily, therapeutically suitable for patient . -She is advised to increase her glipizide to 10 mg p.o. daily with breakfast,  continue at 5 mg daily with supper.    - Patient specific target  A1c;  LDL, HDL, Triglycerides, and  Waist Circumference were discussed in detail.  2) Blood Pressure /Hypertension: She has well-controlled hypertension. she is advised to continue her current medications including lisinopril 2.5 mg p.o. daily with breakfast .  3) Lipids/Hyperlipidemia:   Review of her recent lipid panel showed uncontrolled  LDL at 137 .  She is currently on atorvastatin 10 mg p.o. daily at bedtime.     4)  Weight/Diet: She has a BMI of 29-she is a candidate for weight loss.  I discussed with her the fact that loss of 5 - 10% of her  current body weight will have the most impact on her diabetes management.  CDE Consult will be initiated . Exercise, and detailed carbohydrates information provided  -  detailed on discharge instructions.  5) Chronic Care/Health Maintenance:  -she  is on ACEI medications and  is encouraged to initiate and continue to follow up with Ophthalmology, Dentist,  Podiatrist at least yearly or according to recommendations, and advised to  stay away from smoking. I have recommended yearly flu vaccine and pneumonia vaccine at least every 5 years; moderate intensity exercise for up to 150 minutes weekly; and  sleep for at least 7 hours a day.  - she is  advised to maintain close follow up with Mikey Kirschner, MD for primary care needs, as well as her other providers  for optimal and coordinated care.  - Patient Care Time Today:  25 min, of which >50% was spent in  counseling and the rest reviewing her  current and  previous labs/studies, previous treatments, her blood glucose readings, and medications' doses and developing a plan for long-term care based on the latest recommendations for standards of care.   Tara Bryant participated in the discussions, expressed understanding, and voiced agreement with the above plans.  All questions were answered to her satisfaction. she is encouraged to  contact clinic should she have any questions or concerns prior to her return visit.   Follow up plan: - Return in about 2 weeks (around 02/10/2019), or office, for Bring Meter and Logs- A1c in Office, Include 8 log sheets.  Glade Lloyd, MD [facility] [address] [place], [place] [address] Phone: [phone]  Fax: [phone]    01/27/2019, 1:39 PM  This note was partially dictated with voice recognition software. Similar sounding words can be transcribed inadequately or may not  be corrected upon review.

## 2019-02-03 ENCOUNTER — Other Ambulatory Visit: Payer: Self-pay

## 2019-02-03 ENCOUNTER — Other Ambulatory Visit (INDEPENDENT_AMBULATORY_CARE_PROVIDER_SITE_OTHER): Payer: PRIVATE HEALTH INSURANCE | Admitting: *Deleted

## 2019-02-03 DIAGNOSIS — Z23 Encounter for immunization: Secondary | ICD-10-CM | POA: Diagnosis not present

## 2019-02-04 ENCOUNTER — Encounter: Payer: Self-pay | Admitting: Internal Medicine

## 2019-02-04 ENCOUNTER — Ambulatory Visit (INDEPENDENT_AMBULATORY_CARE_PROVIDER_SITE_OTHER): Payer: PRIVATE HEALTH INSURANCE | Admitting: Internal Medicine

## 2019-02-04 DIAGNOSIS — Z95810 Presence of automatic (implantable) cardiac defibrillator: Secondary | ICD-10-CM

## 2019-02-04 DIAGNOSIS — I5022 Chronic systolic (congestive) heart failure: Secondary | ICD-10-CM | POA: Diagnosis not present

## 2019-02-04 NOTE — Progress Notes (Signed)
HPI Ms. Reicher returns today for ongoing evaluation and management of her chronic systolic heart failure and is s/p ICD insertion.  She is a 48 year old woman with a nonischemic cardiomyopathy, chronic systolic heart failure, who is been on maximal medical therapy. 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 Since her ICD Insertion, she has done well. She denies any ICD therapies. Her incision is tender at times. No edema. She admits to being sedentary. Allergies  Allergen Reactions  . Aspartame And Phenylalanine Dermatitis     Current Outpatient Medications  Medication Sig Dispense Refill  . apixaban (ELIQUIS) 5 MG TABS tablet Take 1 tablet (5 mg total) by mouth 2 (two) times daily. 180 tablet 3  . atorvastatin (LIPITOR) 10 MG tablet Take 10 mg by mouth daily.    . B-D ULTRAFINE III SHORT PEN 31G X 8 MM MISC USE FOR INSULIN INJECTIONS TWICE DAILY 100 each 5  . furosemide (LASIX) 40 MG tablet Take 60 mg by mouth daily.    Marland Kitchen glipiZIDE (GLUCOTROL) 5 MG tablet Take 2 tabs with breakfast, 1 tab with supper 45 tablet 2  . glucose blood (CONTOUR NEXT TEST) test strip USE TO TEST BLOOD SUGAR TWICE DAILY AS DIRECTED 100 each 3  . Insulin Glargine (BASAGLAR KWIKPEN) 100 UNIT/ML SOPN INJECT 80 UNITS UNDER THE SKIN INTO THE SKIN AT BEDTIME 15 mL 2  . metFORMIN (GLUCOPHAGE) 500 MG tablet TAKE 1 TABLET(500 MG) BY MOUTH TWICE DAILY WITH A MEAL 180 tablet 0  . metoprolol succinate (TOPROL-XL) 100 MG 24 hr tablet Take 1&1/2 tablets (150 mg) by mouth daily. Take with or immediately following a meal. 135 tablet 1  . NORLYDA 0.35 MG tablet Take 1 tablet by mouth daily.   12  . sacubitril-valsartan (ENTRESTO) 97-103 MG Take 1 tablet by mouth 2 (two) times daily. 60 tablet 5  . spironolactone (ALDACTONE) 25 MG tablet Take 1 tablet (25 mg total) by mouth daily. 90 tablet 3   No current facility-administered medications for this visit.      Past Medical History:  Diagnosis Date   . Arrhythmia   . CAD (coronary artery disease)    a. 03/2018: cath showing 60% mid-LAD stenosis not significant by FFR.   Marland Kitchen Chronic combined systolic (congestive) and diastolic (congestive) heart failure ([place])    a. 03/2018: found to have a newly reduced EF of 20% by echocardiogram  . Depression   . Diabetes mellitus   . Hypertension   . Microproteinuria     ROS:   All systems reviewed and negative except as noted in the HPI.   Past Surgical History:  Procedure Laterality Date  . BACK SURGERY    . BREAST BIOPSY Left 2017   benign  . ICD IMPLANT N/A 10/29/2018   Procedure: ICD IMPLANT;  Surgeon: Evans Lance, MD;  Location: [facility];  Service: Cardiovascular;  Laterality: N/A;  . INTRAVASCULAR PRESSURE WIRE/FFR STUDY N/A 03/24/2018   Procedure: INTRAVASCULAR PRESSURE WIRE/FFR STUDY;  Surgeon: Leonie Man, MD;  Location: [facility];  Service: Cardiovascular;  Laterality: N/A;  . NOSE SURGERY    . RIGHT/LEFT HEART CATH AND CORONARY ANGIOGRAPHY N/A 03/24/2018   Procedure: RIGHT/LEFT HEART CATH AND CORONARY ANGIOGRAPHY;  Surgeon: Leonie Man, MD;  Location: [facility];  Service: Cardiovascular;  Laterality: N/A;  . TONSILLECTOMY    . WISDOM TOOTH EXTRACTION       Family History  Problem Relation Age  of Onset  . Stroke Sister 44       smoker, diabetic, HTN  . Breast cancer Sister   . Cancer Mother        breast  . Breast cancer Mother   . Hypertension Mother   . Diabetes Other   . Heart attack Other   . Chronic Renal Failure Father      Social History   Socioeconomic History  . Marital status: Married    Spouse name: Not on file  . Number of children: Not on file  . Years of education: Not on file  . Highest education level: Not on file  Occupational History  . Not on file  Social Needs  . Financial resource strain: Not on file  . Food insecurity    Worry: Not on file    Inability: Not on file  . Transportation needs     Medical: Not on file    Non-medical: Not on file  Tobacco Use  . Smoking status: Never Smoker  . Smokeless tobacco: Never Used  Substance and Sexual Activity  . Alcohol use: No  . Drug use: No  . Sexual activity: Yes    Birth control/protection: Pill  Lifestyle  . Physical activity    Days per week: Not on file    Minutes per session: Not on file  . Stress: Not on file  Relationships  . Social Herbalist on phone: Not on file    Gets together: Not on file    Attends religious service: Not on file    Active member of club or organization: Not on file    Attends meetings of clubs or organizations: Not on file    Relationship status: Not on file  . Intimate partner violence    Fear of current or ex partner: Not on file    Emotionally abused: Not on file    Physically abused: Not on file    Forced sexual activity: Not on file  Other Topics Concern  . Not on file  Social History Narrative  . Not on file     BP 98/60   Pulse 79   Ht 5' 7.5" (1.715 m)   Wt 199 lb (90.3 kg)   SpO2 98%   BMI 30.71 kg/m   Physical Exam:  Well appearing NAD HEENT: Unremarkable Neck:  No JVD, no thyromegally Lymphatics:  No adenopathy Back:  No CVA tenderness Lungs:  Clear with no wheezes HEART:  Regular rate rhythm, no murmurs, no rubs, no clicks Abd:  soft, positive bowel sounds, no organomegally, no rebound, no guarding Ext:  2 plus pulses, no edema, no cyanosis, no clubbing Skin:  No rashes no nodules Neuro:  CN II through XII intact, motor grossly intact  EKG - nsr with rare PVC's  DEVICE  Normal device function.  See PaceArt for details.   Assess/Plan: 1. ICD - her Faxon Sci single chamber ICD is working normally. We will recheck in several months. 2. Chronic systolic heart failure - her symptoms remain class 2. She will continue her current meds. 3. Sleep apnea - she has undergone a sleep study and been told she has sleep apnea. She will followup with Dr.  Luan Pulling.   Mikle Bosworth.D.

## 2019-02-04 NOTE — Patient Instructions (Signed)
Medication Instructions:  Your physician recommends that you continue on your current medications as directed. Please refer to the Current Medication list given to you today.  Labwork: None ordered.  Testing/Procedures: None ordered.  Follow-Up: Your physician wants you to follow-up in: 9 months with Dr. Lovena Le.   You will receive a reminder letter in the mail two months in advance. If you don't receive a letter, please call our office to schedule the follow-up appointment.  Remote monitoring is used to monitor your ICD from home. This monitoring reduces the number of office visits required to check your device to one time per year. It allows [place] to keep an eye on the functioning of your device to ensure it is working properly. You are scheduled for a device check from home on 05/07/2019. You may send your transmission at any time that day. If you have a wireless device, the transmission will be sent automatically. After your physician reviews your transmission, you will receive a postcard with your next transmission date.  Any Other Special Instructions Will Be Listed Below (If Applicable).  If you need a refill on your cardiac medications before your next appointment, please call your pharmacy.

## 2019-02-12 ENCOUNTER — Ambulatory Visit (INDEPENDENT_AMBULATORY_CARE_PROVIDER_SITE_OTHER): Payer: PRIVATE HEALTH INSURANCE | Admitting: "Endocrinology

## 2019-02-12 ENCOUNTER — Encounter: Payer: Self-pay | Admitting: "Endocrinology

## 2019-02-12 ENCOUNTER — Other Ambulatory Visit: Payer: Self-pay

## 2019-02-12 VITALS — BP 131/88 | HR 91 | Ht 67.5 in | Wt 199.0 lb

## 2019-02-12 DIAGNOSIS — E1165 Type 2 diabetes mellitus with hyperglycemia: Secondary | ICD-10-CM | POA: Diagnosis not present

## 2019-02-12 DIAGNOSIS — E782 Mixed hyperlipidemia: Secondary | ICD-10-CM

## 2019-02-12 DIAGNOSIS — I10 Essential (primary) hypertension: Secondary | ICD-10-CM

## 2019-02-12 LAB — POCT GLYCOSYLATED HEMOGLOBIN (HGB A1C): Hemoglobin A1C: 9.5 % — AB (ref 4.0–5.6)

## 2019-02-12 MED ORDER — ACCU-CHEK GUIDE VI STRP: ORAL_STRIP | 2 refills | Status: DC

## 2019-02-12 MED ORDER — ACCU-CHEK GUIDE W/DEVICE KIT: 1.0000 | PACK | 0 refills | Status: DC

## 2019-02-12 MED ORDER — NOVOLOG MIX 70/30 FLEXPEN (70-30) 100 UNIT/ML ~~LOC~~ SUPN: 50.0000 [IU] | PEN_INJECTOR | Freq: Two times a day (BID) | SUBCUTANEOUS | 2 refills | Status: DC

## 2019-02-12 NOTE — Progress Notes (Signed)
02/12/2019, 12:40 PM                                                    Endocrinology Telehealth Visit Follow up Note -During COVID -19 Pandemic  This visit type was conducted due to national recommendations for restrictions regarding the COVID-19 Pandemic  in an effort to limit this patient's exposure and mitigate transmission of the corona virus.  Due to her co-morbid illnesses, Tara Bryant is at  moderate to high risk for complications without adequate follow up.  This format is felt to be most appropriate for her at this time.  I connected with this patient on 02/12/2019   by telephone and verified that I am speaking with the correct person using two identifiers. Tara Bryant, 1970/08/27. she has verbally consented to this visit. All issues noted in this document were discussed and addressed. The format was not optimal for physical exam.    Subjective:    [name], female    DOB: [birth year].  Tara Bryant is being engaged in telehealth via telephone follow-up in  management of currently uncontrolled symptomatic diabetes requested by  Mikey Kirschner, MD.   Past Medical History:  Diagnosis Date  . Arrhythmia   . CAD (coronary artery disease)    a. 03/2018: cath showing 60% mid-LAD stenosis not significant by FFR.   Marland Kitchen Chronic combined systolic (congestive) and diastolic (congestive) heart failure ([place])    a. 03/2018: found to have a newly reduced EF of 20% by echocardiogram  . Depression   . Diabetes mellitus   . Hypertension   . Microproteinuria    Past Surgical History:  Procedure Laterality Date  . BACK SURGERY    . BREAST BIOPSY Left 2017   benign  . ICD IMPLANT N/A 10/29/2018   Procedure: ICD IMPLANT;  Surgeon: Evans Lance, MD;  Location: [facility];  Service: Cardiovascular;  Laterality: N/A;  . INTRAVASCULAR PRESSURE WIRE/FFR STUDY N/A  03/24/2018   Procedure: INTRAVASCULAR PRESSURE WIRE/FFR STUDY;  Surgeon: Leonie Man, MD;  Location: [facility];  Service: Cardiovascular;  Laterality: N/A;  . NOSE SURGERY    . RIGHT/LEFT HEART CATH AND CORONARY ANGIOGRAPHY N/A 03/24/2018   Procedure: RIGHT/LEFT HEART CATH AND CORONARY ANGIOGRAPHY;  Surgeon: Leonie Man, MD;  Location: [facility];  Service: Cardiovascular;  Laterality: N/A;  . TONSILLECTOMY    . WISDOM TOOTH EXTRACTION     Social History   Socioeconomic History  . Marital status: Married    Spouse name: Not on file  . Number of children: Not on file  . Years of education: Not on file  . Highest education level: Not on file  Occupational History  . Not on file  Social Needs  . Financial resource strain: Not on file  . Food insecurity    Worry: Not on file    Inability: Not on file  . Transportation needs  Medical: Not on file    Non-medical: Not on file  Tobacco Use  . Smoking status: Never Smoker  . Smokeless tobacco: Never Used  Substance and Sexual Activity  . Alcohol use: No  . Drug use: No  . Sexual activity: Yes    Birth control/protection: Pill  Lifestyle  . Physical activity    Days per week: Not on file    Minutes per session: Not on file  . Stress: Not on file  Relationships  . Social Herbalist on phone: Not on file    Gets together: Not on file    Attends religious service: Not on file    Active member of club or organization: Not on file    Attends meetings of clubs or organizations: Not on file    Relationship status: Not on file  Other Topics Concern  . Not on file  Social History Narrative  . Not on file   Outpatient Encounter Medications as of 02/12/2019  Medication Sig  . glipiZIDE (GLUCOTROL) 5 MG tablet Take 10 mg by mouth daily with breakfast.  . apixaban (ELIQUIS) 5 MG TABS tablet Take 1 tablet (5 mg total) by mouth 2 (two) times daily.  Marland Kitchen atorvastatin (LIPITOR) 10 MG tablet Take 10 mg  by mouth daily.  . B-D ULTRAFINE III SHORT PEN 31G X 8 MM MISC USE FOR INSULIN INJECTIONS TWICE DAILY  . Blood Glucose Monitoring Suppl (ACCU-CHEK GUIDE) w/Device KIT 1 Piece by Does not apply route as directed.  . furosemide (LASIX) 40 MG tablet Take 60 mg by mouth daily.  Marland Kitchen glucose blood (ACCU-CHEK GUIDE) test strip Use as instructed  . glucose blood (CONTOUR NEXT TEST) test strip USE TO TEST BLOOD SUGAR TWICE DAILY AS DIRECTED  . insulin aspart protamine - aspart (NOVOLOG MIX 70/30 FLEXPEN) (70-30) 100 UNIT/ML FlexPen Inject 0.5 mLs (50 Units total) into the skin 2 (two) times daily.  . metFORMIN (GLUCOPHAGE) 500 MG tablet TAKE 1 TABLET(500 MG) BY MOUTH TWICE DAILY WITH A MEAL  . metoprolol succinate (TOPROL-XL) 100 MG 24 hr tablet Take 1&1/2 tablets (150 mg) by mouth daily. Take with or immediately following a meal.  . NORLYDA 0.35 MG tablet Take 1 tablet by mouth daily.   . sacubitril-valsartan (ENTRESTO) 97-103 MG Take 1 tablet by mouth 2 (two) times daily.  Marland Kitchen spironolactone (ALDACTONE) 25 MG tablet Take 1 tablet (25 mg total) by mouth daily.  . [DISCONTINUED] glipiZIDE (GLUCOTROL) 5 MG tablet Take 2 tabs with breakfast, 1 tab with supper  . [DISCONTINUED] Insulin Glargine (BASAGLAR KWIKPEN) 100 UNIT/ML SOPN INJECT 80 UNITS UNDER THE SKIN INTO THE SKIN AT BEDTIME   No facility-administered encounter medications on file as of 02/12/2019.     ALLERGIES: Allergies  Allergen Reactions  . Aspartame And Phenylalanine Dermatitis    VACCINATION STATUS: Immunization History  Administered Date(s) Administered  . Influenza Split 02/02/2013  . Influenza,inj,Quad PF,6+ Mos 01/11/2014, 12/15/2014, 12/15/2015, 01/03/2017, 01/28/2018, 02/03/2019  . Influenza-Unspecified 03/08/2011  . Pneumococcal Polysaccharide-23 03/30/2009  . Td 02/04/2006  . Tdap 02/26/2018    Diabetes She presents for her follow-up diabetic visit. She has type 2 diabetes mellitus. Onset time: She was diagnosed at  approximate age of 3 years. Her disease course has been worsening. There are no hypoglycemic associated symptoms. Pertinent negatives for hypoglycemia include no confusion, headaches, pallor or seizures. Associated symptoms include fatigue, polydipsia and polyuria. Pertinent negatives for diabetes include no chest pain and no polyphagia. There are no hypoglycemic  complications. Symptoms are worsening. Diabetic complications include heart disease. Risk factors for coronary artery disease include diabetes mellitus, dyslipidemia, hypertension, family history and sedentary lifestyle. Current diabetic treatments: She is currently on Lantus 70 units nightly, glipizide 5 mg once a day, metformin 500 mg p.o. twice daily. Her weight is fluctuating minimally. She is following a generally unhealthy diet. When asked about meal planning, she reported none. She has had a previous visit with a dietitian. She never participates in exercise. Her home blood glucose trend is increasing steadily. Her breakfast blood glucose range is generally >200 mg/dl. Her lunch blood glucose range is generally >200 mg/dl. Her dinner blood glucose range is generally >200 mg/dl. Her bedtime blood glucose range is generally >200 mg/dl. Her overall blood glucose range is >200 mg/dl. (She returns with persistently above target glycemic profile.  Her recent A1c is 9.5% today on point-of-care.) An ACE inhibitor/angiotensin II receptor blocker is not being taken. Eye exam is current.  Hyperlipidemia This is a chronic problem. The current episode started more than 1 year ago. Exacerbating diseases include diabetes. Pertinent negatives include no chest pain, myalgias or shortness of breath. Current antihyperlipidemic treatment includes statins. Risk factors for coronary artery disease include dyslipidemia, diabetes mellitus, hypertension, a sedentary lifestyle and family history.  Hypertension This is a chronic problem. The current episode started more  than 1 year ago. Pertinent negatives include no chest pain, headaches, palpitations or shortness of breath. Risk factors for coronary artery disease include diabetes mellitus, dyslipidemia, family history and sedentary lifestyle. Past treatments include beta blockers. Hypertensive end-organ damage includes CAD/MI and heart failure.    Review of systems: Limited as above.  Objective:    BP 131/88   Pulse 91   Ht 5' 7.5" (1.715 m)   Wt 199 lb (90.3 kg)   BMI 30.71 kg/m   Wt Readings from Last 3 Encounters:  02/12/19 199 lb (90.3 kg)  02/04/19 199 lb (90.3 kg)  01/20/19 199 lb 3.2 oz (90.4 kg)      CMP ( most recent) CMP     Component Value Date/Time   NA 138 01/20/2019 1329   NA 138 04/03/2018 1240   K 4.1 01/20/2019 1329   CL 104 01/20/2019 1329   CO2 23 01/20/2019 1329   GLUCOSE 254 (H) 01/20/2019 1329   BUN 9 01/20/2019 1329   BUN 20 04/03/2018 1240   CREATININE 0.83 01/20/2019 1329   CREATININE 0.74 04/06/2014 0934   CALCIUM 8.5 (L) 01/20/2019 1329   PROT 7.4 10/20/2018 1111   PROT 6.5 03/26/2017 0916   ALBUMIN 3.9 10/20/2018 1111   ALBUMIN 4.0 03/26/2017 0916   AST 17 10/20/2018 1111   ALT 21 10/20/2018 1111   ALKPHOS 81 10/20/2018 1111   BILITOT 0.5 10/20/2018 1111   BILITOT 0.4 03/26/2017 0916   GFRNONAA >60 01/20/2019 1329   GFRAA >60 01/20/2019 1329     Diabetic Labs (most recent): Lab Results  Component Value Date   HGBA1C 9.5 (A) 02/12/2019   HGBA1C 9.3 (H) 09/29/2018   HGBA1C 8.2 (H) 03/25/2018     Lipid Panel ( most recent) Lipid Panel     Component Value Date/Time   CHOL 158 09/29/2018 1206   CHOL 202 (H) 03/26/2017 0916   TRIG 109 09/29/2018 1206   HDL 36 (L) 09/29/2018 1206   HDL 45 03/26/2017 0916   CHOLHDL 4.4 09/29/2018 1206   VLDL 22 09/29/2018 1206   LDLCALC 100 (H) 09/29/2018 1206   LDLCALC 134 (H) 03/26/2017  [id number]      Assessment & Plan:   1. Uncontrolled type 2 diabetes mellitus with hyperglycemia ([place])  - Tara Bryant has currently uncontrolled symptomatic type 2 DM since  48 years of age.  She brings in a log showing persistent hyperglycemia despite large dose of basal insulin.  Her point-of-care A1c is 9.5%.   She has no hypoglycemia.  -her diabetes is complicated by coronary artery disease/CHF and she remains at a high risk for more acute and chronic complications which include CAD, CVA, CKD, retinopathy, and neuropathy. These are all discussed in detail with her.  - I have counseled her on diet management and weight loss, by adopting a carbohydrate restricted/protein rich diet.  - she  admits there is a room for improvement in her diet and drink choices. -  Suggestion is made for her to avoid simple carbohydrates  from her diet including Cakes, Sweet Desserts / Pastries, Ice Cream, Soda (diet and regular), Sweet Tea, Candies, Chips, Cookies, Sweet Pastries,  Store Bought Juices, Alcohol in Excess of  1-2 drinks a day, Artificial Sweeteners, Coffee Creamer, and "Sugar-free" Products. This will help patient to have stable blood glucose profile and potentially avoid unintended weight gain.   - I encouraged her to switch to  unprocessed or minimally processed complex starch and increased protein intake (animal or plant source), fruits, and vegetables.  - she is advised to stick to a routine mealtimes to eat 3 meals  a day and avoid unnecessary snacks ( to snack only to correct hypoglycemia).   - she will be scheduled with Jearld Fenton, RDN, CDE for individualized diabetes education.  - I have approached her with the following individualized plan to manage diabetes and patient agrees:   -Given her recent a1c of 9.5%, and uncontrolled fasting glycemic profile, she will require treatment with multiple daily injections of insulin at higher doses in order for her to achieve and maintain control of diabetes to target.   -She was overwhelmed when she was approached with basal/bolus insulin options.    -She will benefit from simplified treatment regimen.  Accordingly, she is advised to discontinue Basaglar.   -I discussed and prescribed NovoLog 70/30 50 units with breakfast and 50 units with supper only when premeal blood glucose is above 90 mg per DL.  She is encouraged to continue monitoring blood glucose 4 times a day-before meals and at bedtime.   - she is encouraged to call clinic for blood glucose levels less than 70 or above 300 mg /dl. - she is advised to continue metformin 500 mg p.o. twice daily, therapeutically suitable for patient . -She is advised to continue glipizide 10 mg p.o. daily at breakfast only.  -She will be considered for some incretin therapy on subsequent visits. - Patient specific target  A1c;  LDL, HDL, Triglycerides, and  Waist Circumference were discussed in detail.  2) Blood Pressure /Hypertension:  she has well-controlled blood pressure:she is advised to continue her current medications including lisinopril 2.5 mg p.o. daily with breakfast .  3) Lipids/Hyperlipidemia:   Review of her recent lipid panel showed uncontrolled  LDL at 137 .  She is currently on atorvastatin 10 mg p.o. daily at bedtime.  She is advised to continue, side effects discussed with her.   4)  Weight/Diet: She has a BMI of 29-she is a candidate for weight loss.  I discussed with her the fact that loss of 5 - 10% of her  current body weight will  have the most impact on her diabetes management.  CDE Consult will be initiated . Exercise, and detailed carbohydrates information provided  -  detailed on discharge instructions.  5) Chronic Care/Health Maintenance:  -she  is on ACEI medications and  is encouraged to initiate and continue to follow up with Ophthalmology, Dentist,  Podiatrist at least yearly or according to recommendations, and advised to  stay away from smoking. I have recommended yearly flu vaccine and pneumonia vaccine at least every 5 years; moderate intensity exercise for up to 150  minutes weekly; and  sleep for at least 7 hours a day.  - she is  advised to maintain close follow up with Mikey Kirschner, MD for primary care needs, as well as her other providers for optimal and coordinated care.  - Time spent with the patient: 25 min, of which >50% was spent in reviewing her blood glucose logs , discussing her hypoglycemia and hyperglycemia episodes, reviewing her current and  previous labs / studies and medications  doses and developing a plan to avoid hypoglycemia and hyperglycemia. Please refer to Patient Instructions for Blood Glucose Monitoring and Insulin/Medications Dosing Guide"  in media tab for additional information. Please  also refer to " Patient Self Inventory" in the Media  tab for reviewed elements of pertinent patient history.  Tara Bryant participated in the discussions, expressed understanding, and voiced agreement with the above plans.  All questions were answered to her satisfaction. she is encouraged to contact clinic should she have any questions or concerns prior to her return visit.    Follow up plan: - Return in about 2 weeks (around 02/26/2019) for Follow up with Meter and Logs Only - no Labs.  Glade Lloyd, MD [facility] [address] [place], [place] [address] Phone: [phone]  Fax: [phone]    02/12/2019, 12:40 PM  This note was partially dictated with voice recognition software. Similar sounding words can be transcribed inadequately or may not  be corrected upon review.

## 2019-02-17 ENCOUNTER — Inpatient Hospital Stay (HOSPITAL_COMMUNITY): Admission: RE | Admit: 2019-02-17 | Payer: PRIVATE HEALTH INSURANCE | Source: Ambulatory Visit

## 2019-02-18 ENCOUNTER — Encounter: Payer: PRIVATE HEALTH INSURANCE | Admitting: Genetic Counselor

## 2019-02-23 NOTE — Progress Notes (Signed)
Referring Physician: Dr. Harl Bowie Primary Care: Mickie Hillier Primary Cardiologist: Harl Bowie HF Cardiologist: Haroldine Laws  HPI:  Ms. Scheible is a 48 y/o with a history of systolic HF due to [employer] referred by Dr. Harl Bowie for HF Consultation.   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  She recently presented to HF clinic for follow-up with Dr. Haroldine Laws. At that visit, she reported feeling pretty good. She denied CP or SOB, but is not overly active. She could do all ADLs and go to store without much problem. She did report getting SOB if she walks up hills. She walked 3.6 miles with her daughter last week. No problems with medicines. Occasional ankle edema. Her T2DM is not well controlled. Follows with Dr. Dorris Fetch in Endo.   Today she returns to HF clinic for pharmacist medication titration. At last visit with MD, Delene Loll was increased to 97/103 mg BID and furosemide was increased to 60 mg daily due to elevated volume status. She was then started on spironolactone 25 mg daily on pharmacy visit on 01/20/19. Since that visit she has been doing well overall. She did not tolerate spironolactone at the 25 mg dose due to dizziness. She has not had anymore dizziness since spironolactone was decreased to 12.5 mg daily. She does complain of fatigue and states it is worse in the morning. She feels that some of her fatigue may  be related to poor sleep. She was recently diagnosed with sleep apnea (02/2019) and is working on getting a CPAP machine. She will call on the status of this today. No chest pain or palpitations. No complaints of SOB/DOE but she is not terribly active. She does not weigh herself daily. She takes furosemide 60 mg daily and takes an extra 20 mg PRN approximately 2x/month. She has trace edema on her left lower extremity but this is her baseline. She has bought compression stockings for this but has not worn them yet. No PND or orthopnea. ReDS reading in clinic 34%. Her appetite is good. She does not follow a strict low salt diet. Compliant with all medications. No medication access or affordability issues.  . Shortness of breath/dyspnea on exertion? no  . Orthopnea/PND? no . Edema? Yes - Trace ankle edema in left extremity (baseline for her). Encouraged her to use compression stockings. . Lightheadedness/dizziness? No (none since spironolactone decreased to 12.5 mg daily). . Daily weights at home? no . Blood pressure/heart rate monitoring at home? no . Following low-sodium/fluid-restricted diet? no  HF Medications: Metoprolol succinate 150 mg daily Entresto 97/103 mg BID Spironolactone 12.5 mg daily Furosemide 60 mg daily  Has the patient been experiencing any side effects to the medications prescribed?  Yes - dizziness with spironolactone - resolved with decreasing dose.  Does the patient have any problems obtaining medications due to transportation or finances?   No - Has commercial insurance through [employer]  Understanding of regimen: good Understanding of indications: fair Potential of compliance: good Patient understands to avoid NSAIDs. Patient  understands to avoid decongestants.    Pertinent Lab Values: . Serum creatinine 0.81, BUN 14, Potassium 4.0, Sodium 140, BNP 49.6, Hemoglobin A1C: 9.3% (09/29/18), Magnesium 1.7    Vital Signs: . Weight: 196.0 lbs (last clinic weight: 199.2  lbs) . Blood pressure: 130/90 . Heart rate: 90 . 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 - NYHA II  - Euvolemic on exam, ReDS 34% today (down from 42% last month).  - Vitals: BP 130/90, HR 90 - Labs reviewed in clinic today were stable- Scr 0.81. K 4.0, BNP 49.6 - Continue furosemide 60 mg daily. Encouraged compression stockings for baseline ankle edema.  - Continue Entresto 97/103 mg BID - Continue metoprolol succinate 150 daily (last increased 11/13/18). Will not increase today given recent issues with dizziness as well as fatigue. Hopefully can up-titrate at next visit.  - Continue spironolactone 12.5 mg daily. Experienced dizziness with 25 mg daily that resolved when dose was decreased. Consider re-evaluating at future visits and up-titrate as tolerated.  -Although she would benefit from an SGLT2i for both heart failure and type 2 diabetes mellitus, Dr. Dorris Fetch (her endocrinologist) is concerned about adverse effects linked to these medications. As such, will not initiate at this time, but would encourage re-evaluation in the future. Jardiance copay is $0.00.  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 CAD - moderate nonobstructive disease by cath -no s/s ischemia  4. DM2 - not well-controlled, A1C 9.3% (09/29/18) - Managed by Dr. Dorris Fetch at [facility] - Continue  metformin 500 mg twice daily, glipizide 10 mg daily, and Novolog 70/30 70 units AM and 60 units PM.  - Continue statin - Will not initiate Jardiance at this time per Dr. Liliane Channel concerns (see above).   5. OSA - she reports tested prior to 2012, she is unsure of results - +snoring, unsure of apneic episodes, +daytime somnolence. - will need home sleep study   Plan: 1) Medication changes: Based on clinical presentation, vital signs and recent labs will make no medication changes today. 2) Labs: Scr 0.81, K 4.0 3) Follow-up: 5 weeks with Dr. Haroldine Laws.    Audry Riles, PharmD, BCPS, CPP Heart Failure Clinic Pharmacist [phone]

## 2019-02-25 ENCOUNTER — Other Ambulatory Visit: Payer: Self-pay

## 2019-02-25 ENCOUNTER — Ambulatory Visit (INDEPENDENT_AMBULATORY_CARE_PROVIDER_SITE_OTHER): Payer: PRIVATE HEALTH INSURANCE | Admitting: Cardiology

## 2019-02-25 ENCOUNTER — Encounter: Payer: Self-pay | Admitting: Cardiology

## 2019-02-25 VITALS — BP 101/64 | HR 90 | Temp 95.1°F | Ht 67.5 in | Wt 198.0 lb

## 2019-02-25 DIAGNOSIS — I5022 Chronic systolic (congestive) heart failure: Secondary | ICD-10-CM | POA: Diagnosis not present

## 2019-02-25 DIAGNOSIS — I428 Other cardiomyopathies: Secondary | ICD-10-CM

## 2019-02-25 MED ORDER — SPIRONOLACTONE 25 MG PO TABS: 12.5000 mg | ORAL_TABLET | Freq: Every day | ORAL | 3 refills | Status: DC

## 2019-02-25 MED ORDER — APIXABAN 5 MG PO TABS: 5.0000 mg | ORAL_TABLET | Freq: Two times a day (BID) | ORAL | 3 refills | Status: DC

## 2019-02-25 NOTE — Progress Notes (Signed)
Clinical Summary Ms. Koble is a 48 y.o.female seen today for follow up of the following medical problems.   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  - 08/2018 echo: LVEF 25-30%, grade II diastolic dysfunction, mild RV dysfunction - 10/29/18 ICD [employer]  - followed by CHF clinic, has had close f/u with CHF pharmacy clinic as well   - no recent edema. Clinic weights are stable 198-200 lbs.  - no recent SOB/DOE.  01/2019 ICD normal device function    2. Pulmonary hypertension - type II, her mean PA was 45 and PCWP 41 in setting of severel volume overload. Moderate RV dysfunction by MRI  3. CAD - moderate nonobstructive disease by cath -no recent symptoms   4. Sinus tachcyardia - resolved since last visit. Negative workup. She has had prior visits with sinus tach of unclear etiology.    5. 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 Arrhythmia   . CAD (coronary artery disease)    a. 03/2018: cath showing 60% mid-LAD stenosis not significant by FFR.   Marland Kitchen Chronic combined systolic (congestive) and diastolic (congestive) heart failure ([place])    a. 03/2018: found to have a newly reduced EF of 20% by echocardiogram  . Depression   . Diabetes mellitus   . Hypertension   . Microproteinuria       Allergies  Allergen Reactions  . Aspartame And Phenylalanine Dermatitis     Current Outpatient Medications  Medication Sig Dispense Refill  . apixaban (ELIQUIS) 5 MG TABS tablet Take 1 tablet (5 mg total) by mouth 2 (two) times daily. 180 tablet 3  . atorvastatin (LIPITOR) 10 MG tablet Take 10 mg by mouth daily.    . B-D ULTRAFINE III SHORT PEN 31G X 8 MM MISC USE FOR INSULIN INJECTIONS TWICE DAILY 100 each 5  . Blood Glucose Monitoring Suppl (ACCU-CHEK GUIDE) w/Device KIT 1 Piece by Does not apply route as directed. 1 kit 0  . furosemide (LASIX) 40 MG tablet Take 60 mg by mouth daily.    Marland Kitchen glipiZIDE (GLUCOTROL) 5 MG tablet Take 10 mg by mouth daily with breakfast.    . glucose blood (ACCU-CHEK GUIDE) test strip Use as instructed 150 each 2  . glucose blood (CONTOUR NEXT TEST) test strip USE TO TEST BLOOD SUGAR TWICE DAILY AS DIRECTED 100 each 3  . insulin aspart protamine - aspart (NOVOLOG MIX 70/30 FLEXPEN) (70-30) 100 UNIT/ML FlexPen Inject 0.5 mLs (50 Units total) into the skin 2 (two) times daily. 5 pen 2  . metFORMIN (GLUCOPHAGE) 500 MG tablet TAKE 1 TABLET(500 MG) BY MOUTH TWICE DAILY WITH A MEAL 180 tablet 0  . metoprolol succinate (TOPROL-XL) 100 MG 24 hr tablet Take 1&1/2 tablets (150 mg) by mouth daily. Take with or immediately following a meal. 135 tablet 1  .  NORLYDA 0.35 MG tablet Take 1 tablet by mouth daily.   12  . sacubitril-valsartan (ENTRESTO) 97-103 MG Take 1 tablet by mouth 2 (two) times daily. 60 tablet 5  . spironolactone (ALDACTONE) 25 MG tablet Take 1 tablet (25 mg total) by mouth daily. 90 tablet 3   No current facility-administered medications for this visit.      Past Surgical History:  Procedure Laterality Date  . BACK SURGERY    . BREAST BIOPSY Left 2017   benign  . ICD IMPLANT N/A 10/29/2018   Procedure: ICD IMPLANT;  Surgeon: Evans Lance, MD;  Location: [place] [facility];  Service: Cardiovascular;  Laterality: N/A;  . INTRAVASCULAR PRESSURE  WIRE/FFR STUDY N/A 03/24/2018   Procedure: INTRAVASCULAR PRESSURE WIRE/FFR STUDY;  Surgeon: Leonie Man, MD;  Location: [place] [facility];  Service: Cardiovascular;  Laterality: N/A;  . NOSE SURGERY    . RIGHT/LEFT HEART CATH AND CORONARY ANGIOGRAPHY N/A 03/24/2018   Procedure: RIGHT/LEFT HEART CATH AND CORONARY ANGIOGRAPHY;  Surgeon: Leonie Man, MD;  Location: [place] [facility];  Service: Cardiovascular;  Laterality: N/A;  . TONSILLECTOMY    . WISDOM TOOTH EXTRACTION       Allergies  Allergen Reactions  . Aspartame And Phenylalanine Dermatitis      Family History  Problem Relation Age of [place]  . Stroke Sister 61       smoker, diabetic, HTN  . Breast cancer Sister   . Cancer Mother        breast  . Breast cancer Mother   . Hypertension Mother   . Diabetes Other   . Heart attack Other   . Chronic Renal Failure Father      Social History Ms. Richart reports that she has never smoked. She has never used smokeless tobacco. Ms. Cohoon reports no history of alcohol use.   Review of Systems CONSTITUTIONAL: No weight loss, fever, chills, weakness or fatigue.  HEENT: Eyes: No visual loss, blurred vision, double vision or yellow sclerae.No hearing loss, sneezing, congestion, runny nose or sore throat.  SKIN: No rash or itching.  CARDIOVASCULAR:per hpi RESPIRATORY: No shortness of breath, cough or sputum.  GASTROINTESTINAL: No anorexia, nausea, vomiting or diarrhea. No abdominal pain or blood.  GENITOURINARY: No burning on urination, no polyuria NEUROLOGICAL: No headache, dizziness, syncope, paralysis, ataxia, numbness or tingling in the extremities. No change in bowel or bladder control.  MUSCULOSKELETAL: No muscle, back pain, joint pain or stiffness.  LYMPHATICS: No enlarged nodes. No history of splenectomy.  PSYCHIATRIC: No history of depression or anxiety.  ENDOCRINOLOGIC: No reports of sweating, cold or heat intolerance. No polyuria or polydipsia.  Marland Kitchen    Physical Examination Today's Vitals   02/25/19 1256  BP: 101/64  Pulse: 90  Temp: (!) 95.1 F (35.1 C)  SpO2: 98%  Weight: 198 lb (89.8 kg)  Height: 5' 7.5" (1.715 m)   Body mass index is 30.55 kg/m.  Gen: resting comfortably, no acute distress HEENT: no scleral icterus, pupils equal round and reactive, no palptable cervical adenopathy,  CV: RRR, no m/r/g, no jvd Resp: Clear to auscultation bilaterally GI: abdomen is soft, non-tender, non-distended, normal bowel sounds, no hepatosplenomegaly MSK: extremities are warm, no edema.  Skin: warm, no rash Neuro:  no focal deficits Psych: appropriate affect   Diagnostic Studies  03/2018 echo LVEF 20%, diffuse hypokinesis, cannot eval diastolic function, mild RV dysfunction, PASP 47, small pericardial effusion  03/2018 LHC/RHC  Prox LAD lesion is 55% stenosed. DFR  0.92  Otherwise angiographically normal coronary arteries.  Hemodynamic findings consistent with severe pulmonary hypertension.  LV end diastolic pressure is severely elevated.  SUMMARY:  Moderate single-vessel disease with roughly 60% mid LAD (DFR 0.92 -not physiologically significant)  Otherwise minimal CAD.  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 (Output 4.48, Index 2.2.)   03/2018 cardiac MRI FINDINGS: 1. Moderately dilated left ventricle with mild concentric hypertrophy and severe systolic function (LVEF = 27%). Severe diffuse hypokinesis and paradoxical septal motion. There is no late gadolinium enhancement in the left ventricular myocardium. There is significant hyper trabeculation of the left ventricle with ratio of non-compacted to compacted myocardium > 4:1.  LVEDD: 61 mm  LVESD: 51 mm  LVEDV: 170 ml  LVESV: 124 ml  SV: 46 ml  CO: 4.6 L/min   Myocardial mass: 166 g  2. Normal right ventricular size, thickness and moderate systolic dysfunction (LVEF = 30%). There are no regional wall motion abnormalities.  3. Normal left and right atrial size.  4. Normal size of the aortic root, ascending aorta and pulmonary artery.  5. Trivial mitral and mild tricuspid regurgitation.  6. Normal pericardium. Mild circumferential pericardial effusion, moderate amount inferior to the heart (up to 11 mm).  IMPRESSION: 1. Moderately dilated left ventricle with mild concentric hypertrophy and severe systolic function (LVEF = 27%). Severe diffuse hypokinesis and paradoxical septal motion. There is no late gadolinium enhancement in the left ventricular myocardium. There is significant hyper trabeculation of the left ventricle with ratio of non-compacted to compacted myocardium > 4:1.  2. Normal right ventricular size, thickness and moderate systolic dysfunction (LVEF = 30%). There are no regional wall motion abnormalities.  3. Normal left and right atrial size.  4. Normal size of the aortic root, ascending aorta and pulmonary artery.  5. Trivial mitral and mild tricuspid regurgitation.  6. Normal pericardium. Mild circumferential pericardial effusion, moderate amount inferior to the heart (up to 11 mm).  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   08/2018 echo IMPRESSIONS   1. The left ventricle has severely reduced systolic function, with an ejection fraction of 25-30%. The cavity size was normal. There is mild concentric left ventricular hypertrophy. Left ventricular diastolic Doppler parameters are consistent with  pseudonormalization. Elevated left ventricular end-diastolic pressure Left ventricular diffuse hypokinesis. 2. The left ventricular apex appears trabeculated. 3. The right ventricle has mildly reduced systolic function.  The cavity was normal. There is mildly increased right ventricular wall thickness. 4. The mitral valve is grossly normal. Mild thickening of the mitral valve leaflet. Mitral valve regurgitation is mild to moderate by color flow Doppler. 5. The tricuspid valve is grossly normal. 6. The aortic valve is grossly normal. Mild thickening of the aortic valve. 7. The aortic root is normal in size and structure.   01/2019 IMPRESSIONS 1.  Moderate obstructive sleep apnea syndrome worse during REM sleep is noted with this recording.  AutoPap 5-12 as recommended.    Assessment and Plan   1.Chronic systolic HF/Noncompaction cardiomyopathy - NICM, noncompaction CM by MRI - has been on anticoag in setting of noncompaction and low LVEF, continue at this time. - some recent orthostatic symptoms since starting aldactone, will lower dose to 12.51m daily and monitor symptoms     Very closely followed by CHF clinic and clinical pharmacy, will spread our appointments to every 6 months  for now.   Arnoldo Lenis, M.D.

## 2019-02-25 NOTE — Patient Instructions (Signed)
Medication Instructions:  DECREASE ALDACTONE TO 12.5 MG DAILY   Labwork: NONE  Testing/Procedures: NONE  Follow-Up: Your physician wants you to follow-up in: 6 MONTHS.  You will receive a reminder letter in the mail two months in advance. If you don't receive a letter, please call our office to schedule the follow-up appointment.   Any Other Special Instructions Will Be Listed Below (If Applicable).     If you need a refill on your cardiac medications before your next appointment, please call your pharmacy.

## 2019-03-01 ENCOUNTER — Other Ambulatory Visit: Payer: Self-pay

## 2019-03-01 ENCOUNTER — Ambulatory Visit (INDEPENDENT_AMBULATORY_CARE_PROVIDER_SITE_OTHER): Payer: PRIVATE HEALTH INSURANCE | Admitting: "Endocrinology

## 2019-03-01 ENCOUNTER — Encounter: Payer: Self-pay | Admitting: "Endocrinology

## 2019-03-01 DIAGNOSIS — E782 Mixed hyperlipidemia: Secondary | ICD-10-CM

## 2019-03-01 DIAGNOSIS — E1165 Type 2 diabetes mellitus with hyperglycemia: Secondary | ICD-10-CM

## 2019-03-01 DIAGNOSIS — I10 Essential (primary) hypertension: Secondary | ICD-10-CM | POA: Diagnosis not present

## 2019-03-01 MED ORDER — NOVOLOG MIX 70/30 FLEXPEN (70-30) 100 UNIT/ML ~~LOC~~ SUPN: 60.0000 [IU] | PEN_INJECTOR | Freq: Two times a day (BID) | SUBCUTANEOUS | 2 refills | Status: DC

## 2019-03-01 NOTE — Patient Instructions (Signed)

## 2019-03-01 NOTE — Progress Notes (Signed)
03/01/2019, 11:51 AM                                                    Endocrinology Telehealth Visit Follow up Note -During COVID -19 Pandemic  This visit type was conducted due to national recommendations for restrictions regarding the COVID-19 Pandemic  in an effort to limit this patient's exposure and mitigate transmission of the corona virus.  Due to her co-morbid illnesses, Tara Bryant is at  moderate to high risk for complications without adequate follow up.  This format is felt to be most appropriate for her at this time.  I connected with this patient on 03/01/2019   by telephone and verified that I am speaking with the correct person using two identifiers. Tara Bryant, Sep 06, 1970. she has verbally consented to this visit. All issues noted in this document were discussed and addressed. The format was not optimal for physical exam.    Subjective:    [name], female    DOB: [birth year].  Tara Bryant is being engaged in telehealth via telephone follow-up in  management of currently uncontrolled symptomatic diabetes requested by  Mikey Kirschner, MD.   Past Medical History:  Diagnosis Date  . Arrhythmia   . CAD (coronary artery disease)    a. 03/2018: cath showing 60% mid-LAD stenosis not significant by FFR.   Marland Kitchen Chronic combined systolic (congestive) and diastolic (congestive) heart failure ([place])    a. 03/2018: found to have a newly reduced EF of 20% by echocardiogram  . Depression   . Diabetes mellitus   . Hypertension   . Microproteinuria    Past Surgical History:  Procedure Laterality Date  . BACK SURGERY    . BREAST BIOPSY Left 2017   benign  . ICD IMPLANT N/A 10/29/2018   Procedure: ICD IMPLANT;  Surgeon: Evans Lance, MD;  Location: [facility];  Service: Cardiovascular;  Laterality: N/A;  . INTRAVASCULAR PRESSURE WIRE/FFR STUDY N/A  03/24/2018   Procedure: INTRAVASCULAR PRESSURE WIRE/FFR STUDY;  Surgeon: Leonie Man, MD;  Location: [facility];  Service: Cardiovascular;  Laterality: N/A;  . NOSE SURGERY    . RIGHT/LEFT HEART CATH AND CORONARY ANGIOGRAPHY N/A 03/24/2018   Procedure: RIGHT/LEFT HEART CATH AND CORONARY ANGIOGRAPHY;  Surgeon: Leonie Man, MD;  Location: [facility];  Service: Cardiovascular;  Laterality: N/A;  . TONSILLECTOMY    . WISDOM TOOTH EXTRACTION     Social History   Socioeconomic History  . Marital status: Married    Spouse name: Not on file  . Number of children: Not on file  . Years of education: Not on file  . Highest education level: Not on file  Occupational History  . Not on file  Social Needs  . Financial resource strain: Not on file  . Food insecurity    Worry: Not on file    Inability: Not on file  . Transportation needs  Medical: Not on file    Non-medical: Not on file  Tobacco Use  . Smoking status: Never Smoker  . Smokeless tobacco: Never Used  Substance and Sexual Activity  . Alcohol use: No  . Drug use: No  . Sexual activity: Yes    Birth control/protection: Pill  Lifestyle  . Physical activity    Days per week: Not on file    Minutes per session: Not on file  . Stress: Not on file  Relationships  . Social Herbalist on phone: Not on file    Gets together: Not on file    Attends religious service: Not on file    Active member of club or organization: Not on file    Attends meetings of clubs or organizations: Not on file    Relationship status: Not on file  Other Topics Concern  . Not on file  Social History Narrative  . Not on file   Outpatient Encounter Medications as of 03/01/2019  Medication Sig  . apixaban (ELIQUIS) 5 MG TABS tablet Take 1 tablet (5 mg total) by mouth 2 (two) times daily.  Marland Kitchen atorvastatin (LIPITOR) 10 MG tablet Take 10 mg by mouth daily.  . B-D ULTRAFINE III SHORT PEN 31G X 8 MM MISC USE FOR INSULIN  INJECTIONS TWICE DAILY  . Blood Glucose Monitoring Suppl (ACCU-CHEK GUIDE) w/Device KIT 1 Piece by Does not apply route as directed.  . furosemide (LASIX) 40 MG tablet Take 60 mg by mouth daily.  Marland Kitchen glipiZIDE (GLUCOTROL) 5 MG tablet Take 10 mg by mouth daily with breakfast.  . glucose blood (ACCU-CHEK GUIDE) test strip Use as instructed  . glucose blood (CONTOUR NEXT TEST) test strip USE TO TEST BLOOD SUGAR TWICE DAILY AS DIRECTED  . insulin aspart protamine - aspart (NOVOLOG MIX 70/30 FLEXPEN) (70-30) 100 UNIT/ML FlexPen Inject 0.6-0.7 mLs (60-70 Units total) into the skin 2 (two) times daily.  . metFORMIN (GLUCOPHAGE) 500 MG tablet TAKE 1 TABLET(500 MG) BY MOUTH TWICE DAILY WITH A MEAL  . metoprolol succinate (TOPROL-XL) 100 MG 24 hr tablet Take 1&1/2 tablets (150 mg) by mouth daily. Take with or immediately following a meal.  . NORLYDA 0.35 MG tablet Take 1 tablet by mouth daily.   . sacubitril-valsartan (ENTRESTO) 97-103 MG Take 1 tablet by mouth 2 (two) times daily.  Marland Kitchen spironolactone (ALDACTONE) 25 MG tablet Take 0.5 tablets (12.5 mg total) by mouth daily.  . [DISCONTINUED] insulin aspart protamine - aspart (NOVOLOG MIX 70/30 FLEXPEN) (70-30) 100 UNIT/ML FlexPen Inject 0.5 mLs (50 Units total) into the skin 2 (two) times daily.   No facility-administered encounter medications on file as of 03/01/2019.     ALLERGIES: Allergies  Allergen Reactions  . Aspartame And Phenylalanine Dermatitis    VACCINATION STATUS: Immunization History  Administered Date(s) Administered  . Influenza Split 02/02/2013  . Influenza,inj,Quad PF,6+ Mos 01/11/2014, 12/15/2014, 12/15/2015, 01/03/2017, 01/28/2018, 02/03/2019  . Influenza-Unspecified 03/08/2011  . Pneumococcal Polysaccharide-23 03/30/2009  . Td 02/04/2006  . Tdap 02/26/2018    Diabetes She presents for her follow-up diabetic visit. She has type 2 diabetes mellitus. Onset time: She was diagnosed at approximate age of 35 years. Her disease  course has been worsening. There are no hypoglycemic associated symptoms. Pertinent negatives for hypoglycemia include no confusion, headaches, pallor or seizures. Associated symptoms include fatigue, polydipsia and polyuria. Pertinent negatives for diabetes include no chest pain and no polyphagia. There are no hypoglycemic complications. Symptoms are worsening. Diabetic complications include heart disease.  Risk factors for coronary artery disease include diabetes mellitus, dyslipidemia, hypertension, family history and sedentary lifestyle. Current diabetic treatments: She is currently on Lantus 70 units nightly, glipizide 5 mg once a day, metformin 500 mg p.o. twice daily. Her weight is fluctuating minimally. She is following a generally unhealthy diet. When asked about meal planning, she reported none. She has had a previous visit with a dietitian. She never participates in exercise. Her home blood glucose trend is increasing steadily. Her breakfast blood glucose range is generally >200 mg/dl. Her lunch blood glucose range is generally >200 mg/dl. Her dinner blood glucose range is generally 180-200 mg/dl. Her bedtime blood glucose range is generally >200 mg/dl. Her overall blood glucose range is >200 mg/dl. (She returns with persistently above target glycemic profile.  Her recent A1c is 9.5% today on point-of-care.) An ACE inhibitor/angiotensin II receptor blocker is not being taken. Eye exam is current.  Hyperlipidemia This is a chronic problem. The current episode started more than 1 year ago. Exacerbating diseases include diabetes. Pertinent negatives include no chest pain, myalgias or shortness of breath. Current antihyperlipidemic treatment includes statins. Risk factors for coronary artery disease include dyslipidemia, diabetes mellitus, hypertension, a sedentary lifestyle and family history.  Hypertension This is a chronic problem. The current episode started more than 1 year ago. Pertinent negatives  include no chest pain, headaches, palpitations or shortness of breath. Risk factors for coronary artery disease include diabetes mellitus, dyslipidemia, family history and sedentary lifestyle. Past treatments include beta blockers. Hypertensive end-organ damage includes CAD/MI and heart failure.    Review of systems: Limited as above.  Objective:    There were no vitals taken for this visit.  Wt Readings from Last 3 Encounters:  02/25/19 198 lb (89.8 kg)  02/12/19 199 lb (90.3 kg)  02/04/19 199 lb (90.3 kg)      CMP ( most recent) CMP     Component Value Date/Time   NA 138 01/20/2019 1329   NA 138 04/03/2018 1240   K 4.1 01/20/2019 1329   CL 104 01/20/2019 1329   CO2 23 01/20/2019 1329   GLUCOSE 254 (H) 01/20/2019 1329   BUN 9 01/20/2019 1329   BUN 20 04/03/2018 1240   CREATININE 0.83 01/20/2019 1329   CREATININE 0.74 04/06/2014 0934   CALCIUM 8.5 (L) 01/20/2019 1329   PROT 7.4 10/20/2018 1111   PROT 6.5 03/26/2017 0916   ALBUMIN 3.9 10/20/2018 1111   ALBUMIN 4.0 03/26/2017 0916   AST 17 10/20/2018 1111   ALT 21 10/20/2018 1111   ALKPHOS 81 10/20/2018 1111   BILITOT 0.5 10/20/2018 1111   BILITOT 0.4 03/26/2017 0916   GFRNONAA >60 01/20/2019 1329   GFRAA >60 01/20/2019 1329     Diabetic Labs (most recent): Lab Results  Component Value Date   HGBA1C 9.5 (A) 02/12/2019   HGBA1C 9.3 (H) 09/29/2018   HGBA1C 8.2 (H) 03/25/2018     Lipid Panel ( most recent) Lipid Panel     Component Value Date/Time   CHOL 158 09/29/2018 1206   CHOL 202 (H) 03/26/2017 0916   TRIG 109 09/29/2018 1206   HDL 36 (L) 09/29/2018 1206   HDL 45 03/26/2017 0916   CHOLHDL 4.4 09/29/2018 1206   VLDL 22 09/29/2018 1206   LDLCALC 100 (H) 09/29/2018 1206   LDLCALC 134 (H) 03/26/2017 0916      Assessment & Plan:   1. Uncontrolled type 2 diabetes mellitus with hyperglycemia (HCC)  - Tara Bryant has currently uncontrolled symptomatic  type 2 DM since  49 years of age.  She was  recently initiated on premixed insulin NovoLog 70/30 twice daily, reports still significantly above target glycemic profile.  Her recent point-of-care A1c was 9.5%.  She does not report any hypoglycemia.   her diabetes is complicated by coronary artery disease/CHF and she remains at a high risk for more acute and chronic complications which include CAD, CVA, CKD, retinopathy, and neuropathy. These are all discussed in detail with her.  - I have counseled her on diet management and weight loss, by adopting a carbohydrate restricted/protein rich diet.  - she  admits there is a room for improvement in her diet and drink choices. -  Suggestion is made for her to avoid simple carbohydrates  from her diet including Cakes, Sweet Desserts / Pastries, Ice Cream, Soda (diet and regular), Sweet Tea, Candies, Chips, Cookies, Sweet Pastries,  Store Bought Juices, Alcohol in Excess of  1-2 drinks a day, Artificial Sweeteners, Coffee Creamer, and "Sugar-free" Products. This will help patient to have stable blood glucose profile and potentially avoid unintended weight gain.  - I encouraged her to switch to  unprocessed or minimally processed complex starch and increased protein intake (animal or plant source), fruits, and vegetables.  - she is advised to stick to a routine mealtimes to eat 3 meals  a day and avoid unnecessary snacks ( to snack only to correct hypoglycemia).   - she will be scheduled with Jearld Fenton, RDN, CDE for individualized diabetes education.  - I have approached her with the following individualized plan to manage diabetes and patient agrees:   -Given her recent a1c of 9.5%, and uncontrolled fasting glycemic profile, she will require treatment with multiple daily injections of insulin at higher doses in order for her to achieve and maintain control of diabetes to target.   -She was overwhelmed when she was approached with basal/bolus insulin options.   -She will continue to benefit from  simplified treatment regimen.   -He is advised to increase her NovoLog 70/30 to 60 units with breakfast and 70 units with supper only when premeal blood glucose is above 90 mg per DL.  She is encouraged to continue monitoring blood glucose 4 times a day-before meals and at bedtime.   - she is encouraged to call clinic for blood glucose levels less than 70 or above 300 mg /dl. - she is advised to continue metformin 500 mg p.o. twice daily, therapeutically suitable for patient . -She is advised to continue glipizide 10 mg p.o. daily at breakfast only.  -She will be considered for some incretin therapy on subsequent visits. - Patient specific target  A1c;  LDL, HDL, Triglycerides, and  Waist Circumference were discussed in detail.  2) Blood Pressure /Hypertension:   she is advised to home monitor blood pressure and report if > 140/90 on 2 separate readings.  she is advised to continue her current medications including lisinopril 2.5 mg p.o. daily with breakfast .  3) Lipids/Hyperlipidemia:   Review of her recent lipid panel showed uncontrolled  LDL at 137 .  She is currently on atorvastatin 10 mg p.o. daily at bedtime.   She is advised to continue, side effects discussed with her.   4)  Weight/Diet: She has a BMI of 29-she is a candidate for weight loss.  I discussed with her the fact that loss of 5 - 10% of her  current body weight will have the most impact on her diabetes management.  CDE  Consult will be initiated . Exercise, and detailed carbohydrates information provided  -  detailed on discharge instructions.  5) Chronic Care/Health Maintenance:  -she  is on ACEI medications and  is encouraged to initiate and continue to follow up with Ophthalmology, Dentist,  Podiatrist at least yearly or according to recommendations, and advised to  stay away from smoking. I have recommended yearly flu vaccine and pneumonia vaccine at least every 5 years; moderate intensity exercise for up to 150 minutes  weekly; and  sleep for at least 7 hours a day.  - she is  advised to maintain close follow up with Mikey Kirschner, MD for primary care needs, as well as her other providers for optimal and coordinated care.  - Patient Care Time Today:  25 min, of which >50% was spent in  counseling and the rest reviewing her  current and  previous labs/studies, previous treatments, her blood glucose readings, and medications' doses and developing a plan for long-term care based on the latest recommendations for standards of care.   Tara Bryant participated in the discussions, expressed understanding, and voiced agreement with the above plans.  All questions were answered to her satisfaction. she is encouraged to contact clinic should she have any questions or concerns prior to her return visit.   Follow up plan: - Return in about 4 weeks (around 03/29/2019) for Follow up with Meter and Logs Only - no Labs, Include 8 log sheets.  Glade Lloyd, MD [facility] [address] [place], [place] [address] Phone: [phone]  Fax: [phone]    03/01/2019, 11:51 AM  This note was partially dictated with voice recognition software. Similar sounding words can be transcribed inadequately or may not  be corrected upon review.

## 2019-03-15 ENCOUNTER — Other Ambulatory Visit: Payer: Self-pay

## 2019-03-15 ENCOUNTER — Ambulatory Visit (HOSPITAL_COMMUNITY)
Admission: RE | Admit: 2019-03-15 | Discharge: 2019-03-15 | Disposition: A | Discharge status: Home / Self Care | Payer: PRIVATE HEALTH INSURANCE | Source: Ambulatory Visit | Attending: Internal Medicine | Admitting: Internal Medicine

## 2019-03-15 VITALS — BP 130/90 | HR 90 | Wt 196.0 lb

## 2019-03-15 DIAGNOSIS — I2510 Atherosclerotic heart disease of native coronary artery without angina pectoris: Secondary | ICD-10-CM | POA: Diagnosis not present

## 2019-03-15 DIAGNOSIS — E118 Type 2 diabetes mellitus with unspecified complications: Secondary | ICD-10-CM | POA: Insufficient documentation

## 2019-03-15 DIAGNOSIS — Z79899 Other long term (current) drug therapy: Secondary | ICD-10-CM | POA: Insufficient documentation

## 2019-03-15 DIAGNOSIS — R5383 Other fatigue: Secondary | ICD-10-CM | POA: Insufficient documentation

## 2019-03-15 DIAGNOSIS — I428 Other cardiomyopathies: Secondary | ICD-10-CM | POA: Insufficient documentation

## 2019-03-15 DIAGNOSIS — G4733 Obstructive sleep apnea (adult) (pediatric): Secondary | ICD-10-CM | POA: Insufficient documentation

## 2019-03-15 DIAGNOSIS — I5022 Chronic systolic (congestive) heart failure: Secondary | ICD-10-CM | POA: Diagnosis present

## 2019-03-15 DIAGNOSIS — Z7901 Long term (current) use of anticoagulants: Secondary | ICD-10-CM | POA: Diagnosis not present

## 2019-03-15 DIAGNOSIS — I272 Pulmonary hypertension, unspecified: Secondary | ICD-10-CM | POA: Insufficient documentation

## 2019-03-15 LAB — BASIC METABOLIC PANEL
Anion gap: 9 (ref 5–15)
BUN: 14 mg/dL (ref 6–20)
CO2: 27 mmol/L (ref 22–32)
Calcium: 9.3 mg/dL (ref 8.9–10.3)
Chloride: 104 mmol/L (ref 98–111)
Creatinine, Ser: 0.81 mg/dL (ref 0.44–1.00)
GFR calc Af Amer: >60 mL/min (ref >60)
GFR calc non Af Amer: >60 mL/min (ref >60)
Glucose, Bld: 96 mg/dL (ref 70–99)
Potassium: 4 mmol/L (ref 3.5–5.1)
Sodium: 140 mmol/L (ref 135–145)

## 2019-03-15 LAB — BRAIN NATRIURETIC PEPTIDE: B Natriuretic Peptide: 49.6 pg/mL (ref 0.0–100.0)

## 2019-03-15 LAB — MAGNESIUM: Magnesium: 1.7 mg/dL (ref 1.7–2.4)

## 2019-03-15 NOTE — Patient Instructions (Signed)
It was a pleasure seeing you today!  MEDICATIONS: -No medication changes today -Call if you have questions about your medications.  LABS: -We will call you if your labs need attention.  NEXT APPOINTMENT: Return to clinic in 1 month with Dr. Bensimhon.  In general, to take care of your heart failure: -Limit your fluid intake to 2 Liters (half-gallon) per day.   -Limit your salt intake to ideally 2-3 grams (2000-3000 mg) per day. -Weigh yourself daily and record, and bring that "weight diary" to your next appointment.  (Weight gain of 2-3 pounds in 1 day typically means fluid weight.) -The medications for your heart are to help your heart and help you live longer.   -Please contact us before stopping any of your heart medications.  Call the clinic at [phone] with questions or to reschedule future appointments.  

## 2019-03-16 ENCOUNTER — Other Ambulatory Visit: Payer: Self-pay | Admitting: Family Medicine

## 2019-03-26 ENCOUNTER — Other Ambulatory Visit: Payer: Self-pay | Admitting: "Endocrinology

## 2019-03-30 ENCOUNTER — Encounter: Payer: Self-pay | Admitting: "Endocrinology

## 2019-03-30 ENCOUNTER — Ambulatory Visit (INDEPENDENT_AMBULATORY_CARE_PROVIDER_SITE_OTHER): Payer: PRIVATE HEALTH INSURANCE | Admitting: "Endocrinology

## 2019-03-30 DIAGNOSIS — E782 Mixed hyperlipidemia: Secondary | ICD-10-CM | POA: Diagnosis not present

## 2019-03-30 DIAGNOSIS — E1165 Type 2 diabetes mellitus with hyperglycemia: Secondary | ICD-10-CM | POA: Diagnosis not present

## 2019-03-30 DIAGNOSIS — I10 Essential (primary) hypertension: Secondary | ICD-10-CM

## 2019-03-30 MED ORDER — NOVOLIN 70/30 FLEXPEN RELION (70-30) 100 UNIT/ML ~~LOC~~ SUPN: 70.0000 [IU] | PEN_INJECTOR | Freq: Two times a day (BID) | SUBCUTANEOUS | 2 refills | Status: DC

## 2019-03-30 NOTE — Progress Notes (Signed)
03/30/2019, 10:59 AM                                                    Endocrinology Telehealth Visit Follow up Note -During COVID -19 Pandemic  This visit type was conducted due to national recommendations for restrictions regarding the COVID-19 Pandemic  in an effort to limit this patient's exposure and mitigate transmission of the corona virus.  Due to her co-morbid illnesses, Tara Bryant is at  moderate to high risk for complications without adequate follow up.  This format is felt to be most appropriate for her at this time.  I connected with this patient on 03/30/2019   by telephone and verified that I am speaking with the correct person using two identifiers. Tara Bryant, April 23, 1970. she has verbally consented to this visit. All issues noted in this document were discussed and addressed. The format was not optimal for physical exam.    Subjective:    [name], female    DOB: 1971/01/24.  Tara Bryant is being engaged in telehealth via telephone follow-up in  management of currently uncontrolled symptomatic diabetes requested by  Mikey Kirschner, MD.   Past Medical History:  Diagnosis Date  . Arrhythmia   . CAD (coronary artery disease)    a. 03/2018: cath showing 60% mid-LAD stenosis not significant by FFR.   Marland Kitchen Chronic combined systolic (congestive) and diastolic (congestive) heart failure ([place])    a. 03/2018: found to have a newly reduced EF of 20% by echocardiogram  . Depression   . Diabetes mellitus   . Hypertension   . Microproteinuria    Past Surgical History:  Procedure Laterality Date  . BACK SURGERY    . BREAST BIOPSY Left 2017   benign  . ICD IMPLANT N/A 10/29/2018   Procedure: ICD IMPLANT;  Surgeon: Evans Lance, MD;  Location: [facility];  Service: Cardiovascular;  Laterality: N/A;  . INTRAVASCULAR PRESSURE WIRE/FFR STUDY N/A 03/24/2018    Procedure: INTRAVASCULAR PRESSURE WIRE/FFR STUDY;  Surgeon: Leonie Man, MD;  Location: [facility];  Service: Cardiovascular;  Laterality: N/A;  . NOSE SURGERY    . RIGHT/LEFT HEART CATH AND CORONARY ANGIOGRAPHY N/A 03/24/2018   Procedure: RIGHT/LEFT HEART CATH AND CORONARY ANGIOGRAPHY;  Surgeon: Leonie Man, MD;  Location: [facility];  Service: Cardiovascular;  Laterality: N/A;  . TONSILLECTOMY    . WISDOM TOOTH EXTRACTION     Social History   Socioeconomic History  . Marital status: Married    Spouse name: Not on file  . Number of children: Not on file  . Years of education: Not on file  . Highest education level: Not on file  Occupational History  . Not on file  Tobacco Use  . Smoking status: Never Smoker  . Smokeless tobacco: Never Used  Substance and Sexual Activity  . Alcohol use: No  . Drug use: No  . Sexual activity: Yes  Birth control/protection: Pill  Other Topics Concern  . Not on file  Social History Narrative  . Not on file   Social Determinants of Health   Financial Resource Strain:   . Difficulty of Paying Living Expenses: Not on file  Food Insecurity:   . Worried About Charity fundraiser in the Last Year: Not on file  . Ran Out of Food in the Last Year: Not on file  Transportation Needs:   . Lack of Transportation (Medical): Not on file  . Lack of Transportation (Non-Medical): Not on file  Physical Activity:   . Days of Exercise per Week: Not on file  . Minutes of Exercise per Session: Not on file  Stress:   . Feeling of Stress : Not on file  Social Connections:   . Frequency of Communication with Friends and Family: Not on file  . Frequency of Social Gatherings with Friends and Family: Not on file  . Attends Religious Services: Not on file  . Active Member of Clubs or Organizations: Not on file  . Attends Archivist Meetings: Not on file  . Marital Status: Not on file   Outpatient Encounter Medications as of  03/30/2019  Medication Sig  . apixaban (ELIQUIS) 5 MG TABS tablet Take 1 tablet (5 mg total) by mouth 2 (two) times daily.  Marland Kitchen atorvastatin (LIPITOR) 10 MG tablet Take 10 mg by mouth daily.  . B-D ULTRAFINE III SHORT PEN 31G X 8 MM MISC USE FOR INSULIN INJECTIONS TWICE DAILY  . Blood Glucose Monitoring Suppl (ACCU-CHEK GUIDE) w/Device KIT 1 Piece by Does not apply route as directed.  . furosemide (LASIX) 40 MG tablet Take 60 mg by mouth daily.  Marland Kitchen glipiZIDE (GLUCOTROL) 5 MG tablet Take 10 mg by mouth daily with breakfast.  . glucose blood (ACCU-CHEK GUIDE) test strip Use as instructed  . glucose blood (CONTOUR NEXT TEST) test strip USE TO TEST BLOOD SUGAR TWICE DAILY AS DIRECTED  . insulin aspart protamine - aspart (NOVOLOG MIX 70/30 FLEXPEN) (70-30) 100 UNIT/ML FlexPen 60 units qam & 70 units qhs  . metFORMIN (GLUCOPHAGE) 500 MG tablet TAKE 1 TABLET(500 MG) BY MOUTH TWICE DAILY WITH A MEAL  . metoprolol succinate (TOPROL-XL) 100 MG 24 hr tablet Take 1&1/2 tablets (150 mg) by mouth daily. Take with or immediately following a meal.  . NORLYDA 0.35 MG tablet Take 1 tablet by mouth daily.   . sacubitril-valsartan (ENTRESTO) 97-103 MG Take 1 tablet by mouth 2 (two) times daily.  Marland Kitchen spironolactone (ALDACTONE) 25 MG tablet Take 0.5 tablets (12.5 mg total) by mouth daily.   No facility-administered encounter medications on file as of 03/30/2019.    ALLERGIES: Allergies  Allergen Reactions  . Aspartame And Phenylalanine Dermatitis    VACCINATION STATUS: Immunization History  Administered Date(s) Administered  . Influenza Split 02/02/2013  . Influenza,inj,Quad PF,6+ Mos 01/11/2014, 12/15/2014, 12/15/2015, 01/03/2017, 01/28/2018, 02/03/2019  . Influenza-Unspecified 03/08/2011  . Pneumococcal Polysaccharide-23 03/30/2009  . Td 02/04/2006  . Tdap 02/26/2018    Diabetes She presents for her follow-up diabetic visit. She has type 2 diabetes mellitus. Onset time: She was diagnosed at approximate  age of 8 years. Her disease course has been worsening. There are no hypoglycemic associated symptoms. Pertinent negatives for hypoglycemia include no confusion, headaches, pallor or seizures. Associated symptoms include fatigue, polydipsia and polyuria. Pertinent negatives for diabetes include no chest pain and no polyphagia. There are no hypoglycemic complications. Symptoms are worsening. Diabetic complications include heart disease. Risk factors  for coronary artery disease include diabetes mellitus, dyslipidemia, hypertension, family history and sedentary lifestyle. Current diabetic treatments: She is currently on Lantus 70 units nightly, glipizide 5 mg once a day, metformin 500 mg p.o. twice daily. Her weight is fluctuating minimally. She is following a generally unhealthy diet. When asked about meal planning, she reported none. She has had a previous visit with a dietitian. She never participates in exercise. Her home blood glucose trend is increasing steadily. Her breakfast blood glucose range is generally >200 mg/dl. Her lunch blood glucose range is generally >200 mg/dl. Her dinner blood glucose range is generally 180-200 mg/dl. Her bedtime blood glucose range is generally >200 mg/dl. Her overall blood glucose range is >200 mg/dl. (She returns with persistently above target glycemic profile.  Her recent A1c is 9.5% today on point-of-care.) An ACE inhibitor/angiotensin II receptor blocker is not being taken. Eye exam is current.  Hyperlipidemia This is a chronic problem. The current episode started more than 1 year ago. Exacerbating diseases include diabetes. Pertinent negatives include no chest pain, myalgias or shortness of breath. Current antihyperlipidemic treatment includes statins. Risk factors for coronary artery disease include dyslipidemia, diabetes mellitus, hypertension, a sedentary lifestyle and family history.  Hypertension This is a chronic problem. The current episode started more than 1  year ago. Pertinent negatives include no chest pain, headaches, palpitations or shortness of breath. Risk factors for coronary artery disease include diabetes mellitus, dyslipidemia, family history and sedentary lifestyle. Past treatments include beta blockers. Hypertensive end-organ damage includes CAD/MI and heart failure.    Review of systems: Limited as above.  Objective:    There were no vitals taken for this visit.  Wt Readings from Last 3 Encounters:  03/15/19 196 lb (88.9 kg)  02/25/19 198 lb (89.8 kg)  02/12/19 199 lb (90.3 kg)      CMP ( most recent) CMP     Component Value Date/Time   NA 140 03/15/2019 1417   NA 138 04/03/2018 1240   K 4.0 03/15/2019 1417   CL 104 03/15/2019 1417   CO2 27 03/15/2019 1417   GLUCOSE 96 03/15/2019 1417   BUN 14 03/15/2019 1417   BUN 20 04/03/2018 1240   CREATININE 0.81 03/15/2019 1417   CREATININE 0.74 04/06/2014 0934   CALCIUM 9.3 03/15/2019 1417   PROT 7.4 10/20/2018 1111   PROT 6.5 03/26/2017 0916   ALBUMIN 3.9 10/20/2018 1111   ALBUMIN 4.0 03/26/2017 0916   AST 17 10/20/2018 1111   ALT 21 10/20/2018 1111   ALKPHOS 81 10/20/2018 1111   BILITOT 0.5 10/20/2018 1111   BILITOT 0.4 03/26/2017 0916   GFRNONAA >60 03/15/2019 1417   GFRAA >60 03/15/2019 1417     Diabetic Labs (most recent): Lab Results  Component Value Date   HGBA1C 9.5 (A) 02/12/2019   HGBA1C 9.3 (H) 09/29/2018   HGBA1C 8.2 (H) 03/25/2018     Lipid Panel ( most recent) Lipid Panel     Component Value Date/Time   CHOL 158 09/29/2018 1206   CHOL 202 (H) 03/26/2017 0916   TRIG 109 09/29/2018 1206   HDL 36 (L) 09/29/2018 1206   HDL 45 03/26/2017 0916   CHOLHDL 4.4 09/29/2018 1206   VLDL 22 09/29/2018 1206   LDLCALC 100 (H) 09/29/2018 1206   LDLCALC 134 (H) 03/26/2017 0916      Assessment & Plan:   1. Uncontrolled type 2 diabetes mellitus with hyperglycemia (Tara Bryant)  - BUENA BOEHM has currently uncontrolled symptomatic type 2 DM since  48  years of age.  She was recently initiated on premixed insulin NovoLog 70/30 twice daily. She is reporting improved glycemic profile, still slightly above target.  Did not document no report any major hypoglycemia.  Fasting 226, 252,232,166,158,264,216 Lunch 967,893,810 Supper 331,135,151,100,86,290,122 Bed time 175,102,585   her diabetes is complicated by coronary artery disease/CHF and she remains at a high risk for more acute and chronic complications which include CAD, CVA, CKD, retinopathy, and neuropathy. These are all discussed in detail with her.  - I have counseled her on diet management and weight loss, by adopting a carbohydrate restricted/protein rich diet.  - she  admits there is a room for improvement in her diet and drink choices. -  Suggestion is made for her to avoid simple carbohydrates  from her diet including Cakes, Sweet Desserts / Pastries, Ice Cream, Soda (diet and regular), Sweet Tea, Candies, Chips, Cookies, Sweet Pastries,  Store Bought Juices, Alcohol in Excess of  1-2 drinks a day, Artificial Sweeteners, Coffee Creamer, and "Sugar-free" Products. This will help patient to have stable blood glucose profile and potentially avoid unintended weight gain.   - I encouraged her to switch to  unprocessed or minimally processed complex starch and increased protein intake (animal or plant source), fruits, and vegetables.  - she is advised to stick to a routine mealtimes to eat 3 meals  a day and avoid unnecessary snacks ( to snack only to correct hypoglycemia).   - she has been scheduled with Jearld Fenton, RDN, CDE for individualized diabetes education.  - I have approached her with the following individualized plan to manage diabetes and patient agrees:   -Given her recent a1c of 9.5%, and uncontrolled fasting glycemic profile, she will require treatment with multiple daily injections of insulin at higher doses in order for her to achieve and maintain control of diabetes  to target.   -She was overwhelmed when she was approached with basal/bolus insulin options.   -She will continue to benefit from simplified treatment regimen.   -Her insurance is not providing coverage for NovoLog 70/30.  I have advised her to finish her current supplies using 70 units twice daily with breakfast and supper when her premeal readings are above 90 mg per DL.   After that, her insulin will be switched to Novolin 70/30 to use 70 units twice daily as above.    -  She is encouraged to continue monitoring blood glucose 4 times a day-before meals and at bedtime.   - she is encouraged to call clinic for blood glucose levels less than 70 or above 300 mg /dl. - she is advised to continue metformin 500 mg p.o. twice daily, therapeutically suitable for patient . -She is advised to continue glipizide 10 mg p.o. daily at breakfast only.  -She will be considered for some incretin therapy on subsequent visits. - Patient specific target  A1c;  LDL, HDL, Triglycerides, and  Waist Circumference were discussed in detail.  2) Blood Pressure /Hypertension: she is advised to home monitor blood pressure and report if > 140/90 on 2 separate readings.   she is advised to continue her current medications including lisinopril 2.5 mg p.o. daily with breakfast .  3) Lipids/Hyperlipidemia:   Review of her recent lipid panel showed uncontrolled  LDL at 137 .  She is currently on atorvastatin 10 mg p.o. daily at bedtime.   She is advised to continue, side effects discussed with her.   4)  Weight/Diet: She has a BMI of  29-she is a candidate for weight loss.  I discussed with her the fact that loss of 5 - 10% of her  current body weight will have the most impact on her diabetes management.  CDE Consult will be initiated . Exercise, and detailed carbohydrates information provided  -  detailed on discharge instructions.  5) Chronic Care/Health Maintenance:  -she  is on ACEI medications and  is encouraged to  initiate and continue to follow up with Ophthalmology, Dentist,  Podiatrist at least yearly or according to recommendations, and advised to  stay away from smoking. I have recommended yearly flu vaccine and pneumonia vaccine at least every 5 years; moderate intensity exercise for up to 150 minutes weekly; and  sleep for at least 7 hours a day.  - she is  advised to maintain close follow up with Mikey Kirschner, MD for primary care needs, as well as her other providers for optimal and coordinated care.  - Patient Care Time Today:  25 min, of which >50% was spent in  counseling and the rest reviewing her  current and  previous labs/studies, previous treatments, her blood glucose readings, and medications' doses and developing a plan for long-term care based on the latest recommendations for standards of care.   Tara Bryant participated in the discussions, expressed understanding, and voiced agreement with the above plans.  All questions were answered to her satisfaction. she is encouraged to contact clinic should she have any questions or concerns prior to her return visit.  Follow up plan: - No follow-ups on file.  Glade Lloyd, MD [facility] [address] [place], [place] [address] Phone: [phone]  Fax: [phone]    03/30/2019, 10:59 AM  This note was partially dictated with voice recognition software. Similar sounding words can be transcribed inadequately or may not  be corrected upon review.

## 2019-03-30 NOTE — Patient Instructions (Signed)

## 2019-04-07 ENCOUNTER — Other Ambulatory Visit: Payer: Self-pay | Admitting: "Endocrinology

## 2019-04-23 ENCOUNTER — Telehealth: Payer: Self-pay | Admitting: Internal Medicine

## 2019-04-23 NOTE — Telephone Encounter (Signed)
Spoke with pt, advised I have reviewed chart, no documentation of an outgoing call, no recent results to report.  Pt does not know who left her the message only that it said to call out office back.

## 2019-04-23 NOTE — Telephone Encounter (Signed)
Patient states she is returning call. Patient states she is unsure who called and left message. Please call.

## 2019-04-30 ENCOUNTER — Other Ambulatory Visit: Payer: Self-pay | Admitting: Cardiology

## 2019-04-30 MED ORDER — METOPROLOL SUCCINATE ER 100 MG PO TB24: ORAL_TABLET | ORAL | 1 refills | Status: DC

## 2019-04-30 NOTE — Telephone Encounter (Signed)
Refill sent.

## 2019-04-30 NOTE — Telephone Encounter (Signed)
° ° ° °  1. Which medications need to be refilled? (please list name of each medication and dose if known)  metoprolol succinate (TOPROL-XL) 100 MG 24 hr tablet    2. Which pharmacy/location (including street and city if local pharmacy) is medication to be sent to [place], [place]    3. Do they need a 30 day or 90 day supply?

## 2019-05-04 ENCOUNTER — Other Ambulatory Visit: Payer: Self-pay | Admitting: "Endocrinology

## 2019-05-04 ENCOUNTER — Telehealth (HOSPITAL_COMMUNITY): Payer: Self-pay

## 2019-05-04 NOTE — Telephone Encounter (Signed)

## 2019-05-05 ENCOUNTER — Other Ambulatory Visit: Payer: Self-pay

## 2019-05-05 ENCOUNTER — Encounter (HOSPITAL_COMMUNITY): Payer: Self-pay | Admitting: Internal Medicine

## 2019-05-05 ENCOUNTER — Ambulatory Visit (HOSPITAL_COMMUNITY)
Admission: RE | Admit: 2019-05-05 | Discharge: 2019-05-05 | Disposition: A | Discharge status: Home / Self Care | Payer: 59 | Source: Ambulatory Visit | Attending: Internal Medicine | Admitting: Internal Medicine

## 2019-05-05 VITALS — BP 123/85 | HR 88 | Wt 201.2 lb

## 2019-05-05 DIAGNOSIS — I272 Pulmonary hypertension, unspecified: Secondary | ICD-10-CM | POA: Diagnosis not present

## 2019-05-05 DIAGNOSIS — E119 Type 2 diabetes mellitus without complications: Secondary | ICD-10-CM | POA: Insufficient documentation

## 2019-05-05 DIAGNOSIS — I2510 Atherosclerotic heart disease of native coronary artery without angina pectoris: Secondary | ICD-10-CM | POA: Diagnosis not present

## 2019-05-05 DIAGNOSIS — Z7901 Long term (current) use of anticoagulants: Secondary | ICD-10-CM | POA: Diagnosis not present

## 2019-05-05 DIAGNOSIS — I110 Hypertensive heart disease with heart failure: Secondary | ICD-10-CM | POA: Diagnosis not present

## 2019-05-05 DIAGNOSIS — I5022 Chronic systolic (congestive) heart failure: Secondary | ICD-10-CM

## 2019-05-05 DIAGNOSIS — I10 Essential (primary) hypertension: Secondary | ICD-10-CM

## 2019-05-05 DIAGNOSIS — Z833 Family history of diabetes mellitus: Secondary | ICD-10-CM | POA: Insufficient documentation

## 2019-05-05 DIAGNOSIS — I5042 Chronic combined systolic (congestive) and diastolic (congestive) heart failure: Secondary | ICD-10-CM | POA: Diagnosis present

## 2019-05-05 DIAGNOSIS — I428 Other cardiomyopathies: Secondary | ICD-10-CM | POA: Diagnosis not present

## 2019-05-05 DIAGNOSIS — G4733 Obstructive sleep apnea (adult) (pediatric): Secondary | ICD-10-CM | POA: Diagnosis not present

## 2019-05-05 DIAGNOSIS — Z8249 Family history of ischemic heart disease and other diseases of the circulatory system: Secondary | ICD-10-CM | POA: Insufficient documentation

## 2019-05-05 DIAGNOSIS — Z794 Long term (current) use of insulin: Secondary | ICD-10-CM | POA: Insufficient documentation

## 2019-05-05 DIAGNOSIS — Z79899 Other long term (current) drug therapy: Secondary | ICD-10-CM | POA: Diagnosis not present

## 2019-05-05 DIAGNOSIS — G4739 Other sleep apnea: Secondary | ICD-10-CM

## 2019-05-05 NOTE — Addendum Note (Signed)
Encounter addended by: Chari Manning, CMA on: 05/05/2019 12:32 PM  Actions taken: Clinical Note Signed

## 2019-05-05 NOTE — Progress Notes (Signed)
ADVANCED HF CLINIC NOTE  Referring Physician: Dr. Harl Bowie Primary Care: Mickie Hillier Primary Cardiologist: Harl Bowie  HPI:  Tara Bryant is a 49 y/o with ho systolic HF due to LVNC referred by Dr. Harl Bowie for HF Consultation.   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  Feels pretty good. No complaints. Not exercising regularly but denies any SOB or CP with activities. Mild pedal edema. No orthopnea or PND. Following with Dr. Dorris Fetch for DM2. Says sugars still high. Last A1c 9.5 (11/20). At last visit we increased Entresto to 97/103 bid and increased lasix to 60 daily. Saw Dr. Harl Bowie in 11/20 and was dizzy and spiro decreased to 12.5    Past Medical History:  Diagnosis Date  . Arrhythmia   . CAD (coronary artery disease)    a. 03/2018: cath showing 60% mid-LAD stenosis not significant by FFR.   Marland Kitchen Chronic combined systolic (congestive) and diastolic (congestive) heart failure ([place])    a. 03/2018: found to have a newly reduced EF of 20% by echocardiogram  . Depression   . Diabetes mellitus   . Hypertension   . Microproteinuria     Current Outpatient Medications  Medication Sig Dispense Refill  . apixaban (ELIQUIS) 5 MG TABS tablet Take 1 tablet (5 mg total) by mouth 2 (two) times daily. 180 tablet 3  . atorvastatin (LIPITOR) 10 MG tablet Take 10 mg by mouth daily.    . B-D ULTRAFINE III SHORT PEN 31G  X 8 MM MISC USE FOR INSULIN INJECTIONS TWICE DAILY 100 each 5  . Blood Glucose Monitoring Suppl (ACCU-CHEK GUIDE) w/Device KIT 1 Piece by Does not apply route as directed. 1 kit 0  . furosemide (LASIX) 40 MG tablet Take 60 mg by mouth daily.    Marland Kitchen glipiZIDE (GLUCOTROL) 5 MG tablet Take 2 tablets (10 mg total) by mouth daily before breakfast. 60 tablet 1  . glucose blood (ACCU-CHEK GUIDE) test strip Use as instructed 150 each 2  . glucose blood (CONTOUR NEXT TEST) test strip USE TO TEST BLOOD SUGAR TWICE DAILY AS DIRECTED 100 each 3  . metFORMIN (GLUCOPHAGE) 500 MG tablet TAKE 1 TABLET(500 MG) BY MOUTH TWICE DAILY WITH A MEAL 180 tablet 1  . metoprolol succinate (TOPROL-XL) 100 MG 24 hr tablet Take 1&1/2 tablets (150 mg) by mouth daily. Take with or immediately following a meal. 135 tablet 1  . NORLYDA 0.35 MG tablet Take 1 tablet by mouth daily.   12  . sacubitril-valsartan (ENTRESTO) 97-103 MG Take 1 tablet by mouth 2 (two) times daily. 60 tablet 5  . spironolactone (ALDACTONE) 25 MG tablet Take 0.5 tablets (12.5 mg total) by mouth daily. 90 tablet 3  . Insulin Isophane & Regular Human (NOVOLIN 70/30 FLEXPEN RELION) (70-30) 100 UNIT/ML PEN Inject 70 Units into the skin 2 (two) times daily before a meal. (Patient not taking: Reported on 05/05/2019) 10 pen 2   No current facility-administered medications for this encounter.    Allergies  Allergen Reactions  . Aspartame And Phenylalanine Dermatitis      Social History   Socioeconomic History  . Marital status: Married    Spouse name: Not on file  . Number of children: Not on file  . Years of education: Not on file  . Highest education level: Not on file  Occupational History  . Not on file  Tobacco Use  . Smoking status: Never Smoker  . Smokeless tobacco: Never Used  Substance and Sexual Activity  . Alcohol use: No  . Drug use: No  . Sexual activity: Yes    Birth control/protection: Pill  Other Topics Concern  . Not on file    Social History Narrative  . Not on file   Social Determinants of Health   Financial Resource Strain:   . Difficulty of Paying Living Expenses: Not on file  Food Insecurity:   . Worried About Charity fundraiser in the Last Year: Not on file  . Ran Out of Food in the Last Year: Not on file  Transportation Needs:   . Lack of Transportation (Medical): Not on file  . Lack of Transportation (Non-Medical): Not on file  Physical Activity:   . Days of Exercise per Week: Not on file  . Minutes of Exercise per Session: Not on file  Stress:   . Feeling of Stress : Not on file  Social Connections:   . Frequency of Communication with Friends and Family: Not on file  . Frequency of Social Gatherings with Friends and Family: Not on file  . Attends Religious Services: Not on file  . Active Member of Clubs or Organizations: Not on file  . Attends Archivist Meetings: Not on file  . Marital Status: Not on file  Intimate Partner Violence:   . Fear of Current or Ex-Partner: Not on file  . Emotionally Abused: Not on file  . Physically Abused: Not on file  . Sexually Abused: Not on file      Family History  Problem Relation Age of Onset  . Stroke Sister 90       smoker, diabetic, HTN  . Breast cancer Sister   . Cancer Mother        breast  . Breast cancer Mother   . Hypertension Mother   . Diabetes Other   . Heart attack Other   . Chronic Renal Failure Father     Vitals:   05/05/19 1145  BP: 123/85  Pulse: 88  SpO2: 99%  Weight: 91.3 kg (201 lb 3.2 oz)    PHYSICAL EXAM: General:  Well appearing. No resp difficulty HEENT: normal Neck: supple. no JVD. Carotids 2+ bilat; no bruits. No lymphadenopathy or thryomegaly appreciated. Cor: PMI nondisplaced. Regular rate & rhythm. No rubs, gallops or murmurs. Lungs: clear Abdomen: soft, nontender, nondistended. No hepatosplenomegaly. No bruits or masses. Good bowel sounds. Extremities: obese no cyanosis, clubbing, rash,  edema Neuro: alert & orientedx3, cranial nerves grossly intact. moves all 4 extremities w/o difficulty. Affect pleasant   ECG: 10/19/18 STach 116 qrs 86 LVH with repol. 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 Continue lasix 60 daily - Continue entresto to 97/103 bid - Continue Toprol 150 daily. Will not increase today with recent lightheadedness. Consider increasing at next visit.  - Continue spiro 12.5  - Also consider Wilder Glade  (see below) - Referred for Genetics testing but couldn't afford. Has one daughter and had echo at [facility] which was ok. Has a sister and a brother both who have kids. She will discuss with her brother.  - Recent labs K 4.0, Cr 0.81 BNP 49 - ICD interrogated personally HL score 0 no VT. Activity level 2.1 hours  2. Pulmonary hypertension - likely mixed picture  3. CAD - moderate nonobstructive disease by cath -no s/s ischemia  4. DM2 - consider SGLT2i. Will defer to Dr. Dorris Fetch  5. OSA - Sleep study 10/20 AHI 18 c/w moderate OSA - Dr. Luan Pulling retired. Will refer to Dr. Radford Pax to arrange therapy.   Glori Bickers, MD  12:11 PM

## 2019-05-05 NOTE — Patient Instructions (Signed)
Your Provider would like you to follow up with Dr. Radford Pax for your CPAP.   No Changes On Your Medication.  At the [facility], you and your health needs are our priority. As part of our continuing mission to provide you with exceptional heart care, we have created designated Provider Care Teams. These Care Teams include your primary Cardiologist (physician) and Advanced Practice Providers (APPs- Physician Assistants and Nurse Practitioners) who all work together to provide you with the care you need, when you need it.   You may see any of the following providers on your designated Care Team at your next follow up: Marland Kitchen Dr Glori Bickers . Dr Loralie Champagne . Darrick Grinder, NP . Lyda Jester, PA . Audry Riles, PharmD   Please be sure to bring in all your medications bottles to every appointment.

## 2019-05-07 ENCOUNTER — Ambulatory Visit (INDEPENDENT_AMBULATORY_CARE_PROVIDER_SITE_OTHER): Payer: 59 | Admitting: *Deleted

## 2019-05-07 DIAGNOSIS — I420 Dilated cardiomyopathy: Secondary | ICD-10-CM | POA: Diagnosis not present

## 2019-05-07 LAB — CUP PACEART REMOTE DEVICE CHECK
Date Time Interrogation Session: 20210127160935
Implantable Lead Implant Date: 20200723
Implantable Lead Location: 753860
Implantable Lead Model: 293
Implantable Lead Serial Number: 443823
Implantable Pulse Generator Implant Date: 20200723
Pulse Gen Serial Number: 256025

## 2019-05-07 NOTE — Progress Notes (Signed)
ICD Remote  

## 2019-05-10 ENCOUNTER — Telehealth: Payer: Self-pay | Admitting: "Endocrinology

## 2019-05-10 MED ORDER — GLUCOSE BLOOD VI STRP: 1.0000 | ORAL_STRIP | Freq: Four times a day (QID) | 2 refills | Status: DC

## 2019-05-10 NOTE — Telephone Encounter (Signed)
Sent rx.

## 2019-05-10 NOTE — Telephone Encounter (Signed)
Patient needs a " drum " for her accu chek fast click. [facility] [address]

## 2019-05-11 ENCOUNTER — Other Ambulatory Visit: Payer: Self-pay

## 2019-05-12 ENCOUNTER — Encounter: Payer: Self-pay | Admitting: Family Medicine

## 2019-05-13 ENCOUNTER — Other Ambulatory Visit: Payer: Self-pay

## 2019-05-13 MED ORDER — GLUCOSE BLOOD VI STRP: 1.0000 | ORAL_STRIP | Freq: Four times a day (QID) | 2 refills | Status: DC

## 2019-05-14 ENCOUNTER — Other Ambulatory Visit: Payer: Self-pay

## 2019-05-14 ENCOUNTER — Other Ambulatory Visit: Payer: Self-pay | Admitting: Otolaryngology

## 2019-05-14 ENCOUNTER — Telehealth (HOSPITAL_COMMUNITY): Payer: Self-pay | Admitting: Pharmacist

## 2019-05-14 DIAGNOSIS — E041 Nontoxic single thyroid nodule: Secondary | ICD-10-CM

## 2019-05-14 NOTE — Telephone Encounter (Signed)
Patient Advocate Encounter   Received notification from [employer] that prior authorization for Tara Bryant is required.   PA submitted on CoverMyMeds Key [id number] Status is pending   Will continue to follow.  Audry Riles, PharmD, BCPS, BCCP, CPP Heart Failure Clinic Pharmacist [phone]

## 2019-05-17 NOTE — Telephone Encounter (Signed)
Advanced Heart Failure Patient Advocate Encounter  Prior Authorization for Tara Bryant has been approved.    Effective dates: 05/14/19 through 05/13/20  Audry Riles, PharmD, BCPS, BCCP, CPP Heart Failure Clinic Pharmacist [phone]

## 2019-05-19 ENCOUNTER — Encounter: Payer: Self-pay | Admitting: Family Medicine

## 2019-05-19 ENCOUNTER — Ambulatory Visit (INDEPENDENT_AMBULATORY_CARE_PROVIDER_SITE_OTHER): Payer: 59 | Admitting: Family Medicine

## 2019-05-19 ENCOUNTER — Other Ambulatory Visit: Payer: Self-pay

## 2019-05-19 DIAGNOSIS — Z79899 Other long term (current) drug therapy: Secondary | ICD-10-CM | POA: Diagnosis not present

## 2019-05-19 DIAGNOSIS — E785 Hyperlipidemia, unspecified: Secondary | ICD-10-CM

## 2019-05-19 MED ORDER — ATORVASTATIN CALCIUM 10 MG PO TABS: 10.0000 mg | ORAL_TABLET | Freq: Every day | ORAL | 5 refills | Status: DC

## 2019-05-19 NOTE — Progress Notes (Signed)
   Subjective:  Audiovideo  [name], female    DOB: 1971-01-11, 49 y.o.   MRN: [id number]  Hyperlipidemia This is a chronic problem. The current episode started more than 1 year ago. Treatments tried: lipitor. There are no compliance problems.  Risk factors for coronary artery disease include dyslipidemia.   Pt has moderate sleep apne  Due to see dr turner  Working on the diabetes with dr nida  Dewaine Conger novog   Patient currently followed by endocrinologist 63s.  Followed closely by the cardiologist with history of nonatherosclerotic cardiomyopathy  Review of Systems   Virtual Visit via Video Note  I connected with SHALIA SCHOMMER on 05/19/19 at 10:00 AM EST by a video enabled telemedicine application and verified that I am speaking with the correct person using two identifiers.  Location: Patient: home Provider: office   I discussed the limitations of evaluation and management by telemedicine and the availability of in person appointments. The patient expressed understanding and agreed to proceed.  History of Present Illness:    Observations/Objective:   Assessment and Plan:   Follow Up Instructions:    I discussed the assessment and treatment plan with the patient. The patient was provided an opportunity to ask questions and all were answered. The patient agreed with the plan and demonstrated an understanding of the instructions.   The patient was advised to call back or seek an in-person evaluation if the symptoms worsen or if the condition fails to improve as anticipated. 22 minutes of non-face-to-face time during this encounter.  Patient's exercises off and on challenged by pandemic and lousy weather of late mostly watching her diet though admits she has some room for improvement claims compliance with her lipid medication.     Objective:   Physical Exam   Virtual     Assessment & Plan:  Impression hyperlipidemia discussed diet  discussed compliance discussed recommend appropriate blood work.  6 months worth written

## 2019-05-25 ENCOUNTER — Ambulatory Visit (HOSPITAL_COMMUNITY)
Admission: RE | Admit: 2019-05-25 | Discharge: 2019-05-25 | Disposition: A | Discharge status: Home / Self Care | Payer: 59 | Source: Ambulatory Visit | Attending: Otolaryngology | Admitting: Otolaryngology

## 2019-05-25 ENCOUNTER — Other Ambulatory Visit: Payer: Self-pay

## 2019-05-25 DIAGNOSIS — E041 Nontoxic single thyroid nodule: Secondary | ICD-10-CM | POA: Diagnosis not present

## 2019-06-02 ENCOUNTER — Encounter: Payer: Self-pay | Admitting: "Endocrinology

## 2019-06-02 ENCOUNTER — Ambulatory Visit (INDEPENDENT_AMBULATORY_CARE_PROVIDER_SITE_OTHER): Payer: 59 | Admitting: "Endocrinology

## 2019-06-02 ENCOUNTER — Other Ambulatory Visit: Payer: Self-pay

## 2019-06-02 VITALS — BP 112/82 | HR 112 | Ht 67.5 in | Wt 199.2 lb

## 2019-06-02 DIAGNOSIS — I10 Essential (primary) hypertension: Secondary | ICD-10-CM

## 2019-06-02 DIAGNOSIS — E782 Mixed hyperlipidemia: Secondary | ICD-10-CM

## 2019-06-02 DIAGNOSIS — E1165 Type 2 diabetes mellitus with hyperglycemia: Secondary | ICD-10-CM | POA: Diagnosis not present

## 2019-06-02 LAB — POCT GLYCOSYLATED HEMOGLOBIN (HGB A1C): Hemoglobin A1C: 7.7 % — AB (ref 4.0–5.6)

## 2019-06-02 MED ORDER — GLIPIZIDE 5 MG PO TABS: 5.0000 mg | ORAL_TABLET | Freq: Every day | ORAL | 3 refills | Status: DC

## 2019-06-02 MED ORDER — METFORMIN HCL 500 MG PO TABS: ORAL_TABLET | ORAL | 1 refills | Status: DC

## 2019-06-02 NOTE — Progress Notes (Signed)
06/02/2019, 5:01 PM  Endocrinology follow-up note                              Subjective:    [name], female    DOB: January 14, 1971.  Tara Bryant is being seen in follow-up in  management of currently uncontrolled symptomatic diabetes requested by  Mikey Kirschner, MD.   Past Medical History:  Diagnosis Date  . Arrhythmia   . CAD (coronary artery disease)    a. 03/2018: cath showing 60% mid-LAD stenosis not significant by FFR.   Marland Kitchen Chronic combined systolic (congestive) and diastolic (congestive) heart failure ([place])    a. 03/2018: found to have a newly reduced EF of 20% by echocardiogram  . Depression   . Diabetes mellitus   . Hypertension   . Microproteinuria    Past Surgical History:  Procedure Laterality Date  . BACK SURGERY    . BREAST BIOPSY Left 2017   benign  . ICD IMPLANT N/A 10/29/2018   Procedure: ICD IMPLANT;  Surgeon: Evans Lance, MD;  Location: [facility];  Service: Cardiovascular;  Laterality: N/A;  . INTRAVASCULAR PRESSURE WIRE/FFR STUDY N/A 03/24/2018   Procedure: INTRAVASCULAR PRESSURE WIRE/FFR STUDY;  Surgeon: Leonie Man, MD;  Location: [facility];  Service: Cardiovascular;  Laterality: N/A;  . NOSE SURGERY    . RIGHT/LEFT HEART CATH AND CORONARY ANGIOGRAPHY N/A 03/24/2018   Procedure: RIGHT/LEFT HEART CATH AND CORONARY ANGIOGRAPHY;  Surgeon: Leonie Man, MD;  Location: [facility];  Service: Cardiovascular;  Laterality: N/A;  . TONSILLECTOMY    . WISDOM TOOTH EXTRACTION     Social History   Socioeconomic History  . Marital status: Married    Spouse name: Not on file  . Number of children: Not on file  . Years of education: Not on file  . Highest education level: Not on file  Occupational History  . Not on file  Tobacco Use  . Smoking status: Never Smoker  . Smokeless tobacco: Never Used  Substance and Sexual  Activity  . Alcohol use: No  . Drug use: No  . Sexual activity: Yes    Birth control/protection: Pill  Other Topics Concern  . Not on file  Social History Narrative  . Not on file   Social Determinants of Health   Financial Resource Strain:   . Difficulty of Paying Living Expenses: Not on file  Food Insecurity:   . Worried About Charity fundraiser in the Last Year: Not on file  . Ran Out of Food in the Last Year: Not on file  Transportation Needs:   . Lack of Transportation (Medical): Not on file  . Lack of Transportation (Non-Medical): Not on file  Physical Activity:   . Days of Exercise per Week: Not on file  . Minutes of Exercise per Session: Not on file  Stress:   . Feeling of Stress : Not on file  Social Connections:   . Frequency of Communication with Friends and Family: Not on file  . Frequency of Social Gatherings with  Friends and Family: Not on file  . Attends Religious Services: Not on file  . Active Member of Clubs or Organizations: Not on file  . Attends Archivist Meetings: Not on file  . Marital Status: Not on file   Outpatient Encounter Medications as of 06/02/2019  Medication Sig  . Insulin Aspart Prot & Aspart (NOVOLOG MIX 70/30 FLEXPEN [place]) Inject 80 Units into the skin 2 (two) times daily before a meal.  . apixaban (ELIQUIS) 5 MG TABS tablet Take 1 tablet (5 mg total) by mouth 2 (two) times daily.  Marland Kitchen atorvastatin (LIPITOR) 10 MG tablet Take 1 tablet (10 mg total) by mouth daily.  . B-D ULTRAFINE III SHORT PEN 31G X 8 MM MISC USE FOR INSULIN INJECTIONS TWICE DAILY  . Blood Glucose Monitoring Suppl (ACCU-CHEK GUIDE) w/Device KIT 1 Piece by Does not apply route as directed.  . furosemide (LASIX) 40 MG tablet Take 60 mg by mouth daily.  Marland Kitchen glipiZIDE (GLUCOTROL) 5 MG tablet Take 1 tablet (5 mg total) by mouth daily before breakfast.  . glucose blood test strip 1 each by Other route in the morning, at noon, in the evening, and at bedtime. Use as  instructed to check blood sugar four times daily  . Lancets (ONETOUCH DELICA PLUS [id number]) MISC USE TO TEST BLOOD SUGAR FOUR TIMES DAILY  . metFORMIN (GLUCOPHAGE) 500 MG tablet TAKE 1 TABLET(500 MG) BY MOUTH TWICE DAILY WITH A MEAL  . metoprolol succinate (TOPROL-XL) 100 MG 24 hr tablet Take 1&1/2 tablets (150 mg) by mouth daily. Take with or immediately following a meal.  . NORLYDA 0.35 MG tablet Take 1 tablet by mouth daily.   Marland Kitchen NOVOLOG MIX 70/30 FLEXPEN (70-30) 100 UNIT/ML FlexPen 70 Units 2 (two) times daily.  . sacubitril-valsartan (ENTRESTO) 97-103 MG Take 1 tablet by mouth 2 (two) times daily.  Marland Kitchen spironolactone (ALDACTONE) 25 MG tablet Take 0.5 tablets (12.5 mg total) by mouth daily.  . [DISCONTINUED] glipiZIDE (GLUCOTROL) 5 MG tablet Take 2 tablets (10 mg total) by mouth daily before breakfast.  . [DISCONTINUED] Insulin Isophane & Regular Human (NOVOLIN 70/30 FLEXPEN RELION) (70-30) 100 UNIT/ML PEN Inject 70 Units into the skin 2 (two) times daily before a meal. (Patient not taking: Reported on 05/05/2019)  . [DISCONTINUED] metFORMIN (GLUCOPHAGE) 500 MG tablet TAKE 1 TABLET(500 MG) BY MOUTH TWICE DAILY WITH A MEAL   No facility-administered encounter medications on file as of 06/02/2019.    ALLERGIES: Allergies  Allergen Reactions  . Aspartame And Phenylalanine Dermatitis    VACCINATION STATUS: Immunization History  Administered Date(s) Administered  . Influenza Split 02/02/2013  . Influenza,inj,Quad PF,6+ Mos 01/11/2014, 12/15/2014, 12/15/2015, 01/03/2017, 01/28/2018, 02/03/2019  . Influenza-Unspecified 03/08/2011  . Pneumococcal Polysaccharide-23 03/30/2009  . Td 02/04/2006  . Tdap 02/26/2018    Diabetes She presents for her follow-up diabetic visit. She has type 2 diabetes mellitus. Onset time: She was diagnosed at approximate age of 49 years. Her disease course has been improving. There are no hypoglycemic associated symptoms. Pertinent negatives for hypoglycemia include  no confusion, headaches, pallor or seizures. Associated symptoms include fatigue, polydipsia and polyuria. Pertinent negatives for diabetes include no chest pain and no polyphagia. There are no hypoglycemic complications. Symptoms are improving. Diabetic complications include heart disease. Risk factors for coronary artery disease include diabetes mellitus, dyslipidemia, hypertension, family history and sedentary lifestyle. Current diabetic treatments: She is currently on Lantus 70 units nightly, glipizide 5 mg once a day, metformin 500 mg p.o. twice daily. Her weight  is fluctuating minimally. She is following a generally unhealthy diet. When asked about meal planning, she reported none. She has had a previous visit with a dietitian. She never participates in exercise. Her home blood glucose trend is decreasing steadily. Her breakfast blood glucose range is generally 180-200 mg/dl. Her lunch blood glucose range is generally 180-200 mg/dl. Her dinner blood glucose range is generally 180-200 mg/dl. Her bedtime blood glucose range is generally 180-200 mg/dl. Her overall blood glucose range is 180-200 mg/dl. (She presents with improved glycemic profile averaging between 170 and 185 .  Point-of-care A1c 7.7% improving from 9.5% during her last visit.  ) An ACE inhibitor/angiotensin II receptor blocker is not being taken. Eye exam is current.  Hyperlipidemia This is a chronic problem. The current episode started more than 1 year ago. Exacerbating diseases include diabetes. Pertinent negatives include no chest pain, myalgias or shortness of breath. Current antihyperlipidemic treatment includes statins. Risk factors for coronary artery disease include dyslipidemia, diabetes mellitus, hypertension, a sedentary lifestyle and family history.  Hypertension This is a chronic problem. The current episode started more than 1 year ago. Pertinent negatives include no chest pain, headaches, palpitations or shortness of breath.  Risk factors for coronary artery disease include diabetes mellitus, dyslipidemia, family history and sedentary lifestyle. Past treatments include beta blockers. Hypertensive end-organ damage includes CAD/MI and heart failure.     Review of systems  Constitutional: + Minimally fluctuating body weight,  current  Body mass index is 30.74 kg/m. , no fatigue, no subjective hyperthermia, no subjective hypothermia Eyes: no blurry vision, no xerophthalmia ENT: no sore throat, no nodules palpated in throat, no dysphagia/odynophagia, no hoarseness Cardiovascular: no Chest Pain, no Shortness of Breath, no palpitations, no leg swelling Respiratory: no cough, no shortness of breath Gastrointestinal: no Nausea/Vomiting/Diarhhea Musculoskeletal: no muscle/joint aches Skin: no rashes, no hyperemia Neurological: no tremors, no numbness, no tingling, no dizziness Psychiatric: no depression, no anxiety   Objective:    BP 112/82   Pulse (!) 112   Ht 5' 7.5" (1.715 m)   Wt 199 lb 3.2 oz (90.4 kg)   BMI 30.74 kg/m   Wt Readings from Last 3 Encounters:  06/02/19 199 lb 3.2 oz (90.4 kg)  05/05/19 201 lb 3.2 oz (91.3 kg)  03/15/19 196 lb (88.9 kg)      Physical Exam- Limited  Constitutional:  Body mass index is 30.74 kg/m. , not in acute distress, normal state of mind Eyes:  EOMI, no exophthalmos Neck: Supple Thyroid: No gross goiter Respiratory: Adequate breathing efforts Musculoskeletal: no gross deformities, strength intact in all four extremities, no gross restriction of joint movements Skin:  no rashes, no hyperemia Neurological: no tremor with outstretched hands,   CMP ( most recent) CMP     Component Value Date/Time   NA 140 03/15/2019 1417   NA 138 04/03/2018 1240   K 4.0 03/15/2019 1417   CL 104 03/15/2019 1417   CO2 27 03/15/2019 1417   GLUCOSE 96 03/15/2019 1417   BUN 14 03/15/2019 1417   BUN 20 04/03/2018 1240   CREATININE 0.81 03/15/2019 1417   CREATININE 0.74  04/06/2014 0934   CALCIUM 9.3 03/15/2019 1417   PROT 7.4 10/20/2018 1111   PROT 6.5 03/26/2017 0916   ALBUMIN 3.9 10/20/2018 1111   ALBUMIN 4.0 03/26/2017 0916   AST 17 10/20/2018 1111   ALT 21 10/20/2018 1111   ALKPHOS 81 10/20/2018 1111   BILITOT 0.5 10/20/2018 1111   BILITOT 0.4 03/26/2017 0916  GFRNONAA >60 03/15/2019 1417   GFRAA >60 03/15/2019 1417     Diabetic Labs (most recent): Lab Results  Component Value Date   HGBA1C 7.7 (A) 06/02/2019   HGBA1C 9.5 (A) 02/12/2019   HGBA1C 9.3 (H) 09/29/2018     Lipid Panel ( most recent) Lipid Panel     Component Value Date/Time   CHOL 158 09/29/2018 1206   CHOL 202 (H) 03/26/2017 0916   TRIG 109 09/29/2018 1206   HDL 36 (L) 09/29/2018 1206   HDL 45 03/26/2017 0916   CHOLHDL 4.4 09/29/2018 1206   VLDL 22 09/29/2018 1206   LDLCALC 100 (H) 09/29/2018 1206   LDLCALC 134 (H) 03/26/2017 0916      Assessment & Plan:   1. Uncontrolled type 2 diabetes mellitus with hyperglycemia ([place])  - Tara Bryant has currently uncontrolled symptomatic type 2 DM since  49 years of age.  She was recently initiated on premixed insulin NovoLog 70/30 twice daily.  She presents with improved glycemic profile averaging between 170 and 185 .  Did not document or report hypoglycemia.  Point-of-care A1c 7.7% improving from 9.5% during her last visit.  Her logs and insulin administration records to be scanned into her records. her diabetes is complicated by coronary artery disease/CHF and she remains at a high risk for more acute and chronic complications which include CAD, CVA, CKD, retinopathy, and neuropathy. These are all discussed in detail with her.  - I have counseled her on diet management and weight loss, by adopting a carbohydrate restricted/protein rich diet.  - she  admits there is a room for improvement in her diet and drink choices. -  Suggestion is made for her to avoid simple carbohydrates  from her diet including Cakes, Sweet  Desserts / Pastries, Ice Cream, Soda (diet and regular), Sweet Tea, Candies, Chips, Cookies, Sweet Pastries,  Store Bought Juices, Alcohol in Excess of  1-2 drinks a day, Artificial Sweeteners, Coffee Creamer, and "Sugar-free" Products. This will help patient to have stable blood glucose profile and potentially avoid unintended weight gain.   - I encouraged her to switch to  unprocessed or minimally processed complex starch and increased protein intake (animal or plant source), fruits, and vegetables.  - she is advised to stick to a routine mealtimes to eat 3 meals  a day and avoid unnecessary snacks ( to snack only to correct hypoglycemia).   - she has been scheduled with Jearld Fenton, RDN, CDE for individualized diabetes education.  - I have approached her with the following individualized plan to manage diabetes and patient agrees:   -Her insurance continue to pay for NovoLog 70/30.  She is advised to increase her dose to 80 units twice daily for premeal blood glucose readings above 90 mg per DL.    She is advised to continue to monitor blood glucose 4 times a day-before meals and at bedtime.   -She has a tendency to get overwhelmed for multiple daily injections of insulin, will continue to benefit from simplified treatment regimen.    - she is encouraged to call clinic for blood glucose levels less than 70 or above 300 mg /dl. - she is advised to continue metformin 500 mg p.o. twice daily, therapeutically suitable for patient . -She is advised to lower glipizide to 5 mg p.o. twice daily -with breakfast and with supper.   -She will be considered for some incretin therapy on subsequent visits. - Patient specific target  A1c;  LDL, HDL, Triglycerides,  were discussed in detail.  2) Blood Pressure /Hypertension: Her blood pressure is controlled to target.  she is advised to continue her current medications including lisinopril 2.5 mg p.o. daily with breakfast .  3) Lipids/Hyperlipidemia:    Review of her recent lipid panel showed improved LDL to 100 from 137.    She is currently on atorvastatin 10 mg p.o. daily at bedtime.   She is advised to continue, side effects discussed with her.   4)  Weight/Diet: She has a BMI of 29-she is a candidate for weight loss.  I discussed with her the fact that loss of 5 - 10% of her  current body weight will have the most impact on her diabetes management.  CDE Consult will be initiated . Exercise, and detailed carbohydrates information provided  -  detailed on discharge instructions.  5) Chronic Care/Health Maintenance:  -she  is on ACEI medications and  is encouraged to initiate and continue to follow up with Ophthalmology, Dentist,  Podiatrist at least yearly or according to recommendations, and advised to  stay away from smoking. I have recommended yearly flu vaccine and pneumonia vaccine at least every 5 years; moderate intensity exercise for up to 150 minutes weekly; and  sleep for at least 7 hours a day.  - she is  advised to maintain close follow up with Mikey Kirschner, MD for primary care needs, as well as her other providers for optimal and coordinated care.  - Time spent on this patient care encounter:  35 min, of which > 50% was spent in  counseling and the rest reviewing her blood glucose logs , discussing her hypoglycemia and hyperglycemia episodes, reviewing her current and  previous labs / studies  ( including abstraction from other facilities) and medications  doses and developing a  long term treatment plan and documenting her care.   Please refer to Patient Instructions for Blood Glucose Monitoring and Insulin/Medications Dosing Guide"  in media tab for additional information. Please  also refer to " Patient Self Inventory" in the Media  tab for reviewed elements of pertinent patient history.  Tara Bryant participated in the discussions, expressed understanding, and voiced agreement with the above plans.  All questions were  answered to her satisfaction. she is encouraged to contact clinic should she have any questions or concerns prior to her return visit.  Follow up plan: - Return in about 4 months (around 09/30/2019) for Bring Meter and Logs- A1c in Office.  Glade Lloyd, MD [facility] [address] [place], [place] [address] Phone: [phone]  Fax: [phone]    06/02/2019, 5:01 PM  This note was partially dictated with voice recognition software. Similar sounding words can be transcribed inadequately or may not  be corrected upon review.

## 2019-06-02 NOTE — Patient Instructions (Signed)

## 2019-07-07 ENCOUNTER — Other Ambulatory Visit: Payer: Self-pay

## 2019-07-07 MED ORDER — INSULIN ASPART PROT & ASPART (70-30 MIX) 100 UNIT/ML PEN: 80.0000 [IU] | PEN_INJECTOR | Freq: Two times a day (BID) | SUBCUTANEOUS | 0 refills | Status: DC

## 2019-07-12 ENCOUNTER — Telehealth: Payer: Self-pay | Admitting: "Endocrinology

## 2019-07-12 NOTE — Telephone Encounter (Signed)
Left a message requesting a return call to the office. 

## 2019-07-12 NOTE — Telephone Encounter (Signed)
Pt left a VM that pharmacy is stating they are waiting on insurance before she can get her novolog

## 2019-07-13 NOTE — Telephone Encounter (Signed)
Left a message requesting a return call to the office. 

## 2019-07-19 ENCOUNTER — Telehealth: Payer: Self-pay

## 2019-07-19 NOTE — Telephone Encounter (Signed)
Pt is returning your call

## 2019-07-20 NOTE — Telephone Encounter (Signed)
Discussed medication with pt.

## 2019-08-02 ENCOUNTER — Telehealth: Payer: Self-pay | Admitting: *Deleted

## 2019-08-02 NOTE — Telephone Encounter (Signed)
Says that she has been summons to jury duty and is asking if Dr. Harl Bowie can provide a letter to excuse her.

## 2019-08-03 ENCOUNTER — Encounter: Payer: Self-pay | Admitting: *Deleted

## 2019-08-03 NOTE — Telephone Encounter (Signed)
Patient informed and says she will pick the letter up at the office this week.

## 2019-08-03 NOTE — Telephone Encounter (Signed)
Please provide the following note   To whom it may concern,  Tara Bryant is followed in our cardiology clinic for a significant heart condition. Her condition can cause significant fatigue with even just mild activities. She requires frequent self monitoring during the day, regular use of medications, and regular frequent medical appointments. Please consider excusing her from jury duties due to her chronic severe medical issues.   Carlyle Dolly MD

## 2019-08-04 ENCOUNTER — Other Ambulatory Visit: Payer: Self-pay

## 2019-08-04 MED ORDER — INSULIN ASPART PROT & ASPART (70-30 MIX) 100 UNIT/ML PEN: 80.0000 [IU] | PEN_INJECTOR | Freq: Two times a day (BID) | SUBCUTANEOUS | 2 refills | Status: DC

## 2019-08-06 ENCOUNTER — Ambulatory Visit (INDEPENDENT_AMBULATORY_CARE_PROVIDER_SITE_OTHER): Payer: 59 | Admitting: *Deleted

## 2019-08-06 DIAGNOSIS — I420 Dilated cardiomyopathy: Secondary | ICD-10-CM

## 2019-08-06 LAB — CUP PACEART REMOTE DEVICE CHECK
Battery Remaining Longevity: 180 mo
Battery Remaining Percentage: 100 %
Brady Statistic RV Percent Paced: 0 %
Date Time Interrogation Session: 20210430044300
HighPow Impedance: 76 Ohm
Implantable Lead Implant Date: 20200723
Implantable Lead Location: 753860
Implantable Lead Model: 293
Implantable Lead Serial Number: 443823
Implantable Pulse Generator Implant Date: 20200723
Lead Channel Impedance Value: 517 Ohm
Lead Channel Setting Pacing Amplitude: 3.5 V
Lead Channel Setting Pacing Pulse Width: 0.4 ms
Lead Channel Setting Sensing Sensitivity: 0.5 mV
Pulse Gen Serial Number: 256025

## 2019-08-06 NOTE — Progress Notes (Signed)
ICD Remote  

## 2019-08-25 ENCOUNTER — Telehealth: Payer: Self-pay | Admitting: "Endocrinology

## 2019-08-25 MED ORDER — GLIPIZIDE 5 MG PO TABS: 5.0000 mg | ORAL_TABLET | Freq: Two times a day (BID) | ORAL | 0 refills | Status: DC

## 2019-08-25 NOTE — Telephone Encounter (Signed)
Rx sent 

## 2019-08-25 NOTE — Telephone Encounter (Signed)
Pt needs new RX for glipizide. She was asked at last visit to take 5mg  by mouth twice daily. [facility] [address]

## 2019-09-07 ENCOUNTER — Encounter: Payer: Self-pay | Admitting: Cardiology

## 2019-09-07 ENCOUNTER — Ambulatory Visit (INDEPENDENT_AMBULATORY_CARE_PROVIDER_SITE_OTHER): Payer: 59 | Admitting: Cardiology

## 2019-09-07 ENCOUNTER — Other Ambulatory Visit: Payer: Self-pay

## 2019-09-07 VITALS — BP 122/78 | HR 108 | Ht 68.0 in | Wt 209.0 lb

## 2019-09-07 DIAGNOSIS — I5022 Chronic systolic (congestive) heart failure: Secondary | ICD-10-CM

## 2019-09-07 DIAGNOSIS — M79605 Pain in left leg: Secondary | ICD-10-CM | POA: Diagnosis not present

## 2019-09-07 DIAGNOSIS — I428 Other cardiomyopathies: Secondary | ICD-10-CM | POA: Diagnosis not present

## 2019-09-07 DIAGNOSIS — M79604 Pain in right leg: Secondary | ICD-10-CM

## 2019-09-07 MED ORDER — FUROSEMIDE 40 MG PO TABS: ORAL_TABLET | ORAL | 3 refills | Status: DC

## 2019-09-07 NOTE — Patient Instructions (Signed)
Medication Instructions:  Take lasix 60 mg daily as before. You may now take an additional 20 mg daily for shortness of breath. *If you need a refill on your cardiac medications before your next appointment, please call your pharmacy*   Lab Work: None today If you have labs (blood work) drawn today and your tests are completely normal, you will receive your results only by: Marland Kitchen MyChart Message (if you have MyChart) OR . A paper copy in the mail If you have any lab test that is abnormal or we need to change your treatment, we will call you to review the results.   Testing/Procedures: None today   Follow-Up: At [facility], you and your health needs are our priority.  As part of our continuing mission to provide you with exceptional heart care, we have created designated Provider Care Teams.  These Care Teams include your primary Cardiologist (physician) and Advanced Practice Providers (APPs -  Physician Assistants and Nurse Practitioners) who all work together to provide you with the care you need, when you need it.  We recommend signing up for the patient portal called "MyChart".  Sign up information is provided on this After Visit Summary.  MyChart is used to connect with patients for Virtual Visits (Telemedicine).  Patients are able to view lab/test results, encounter notes, upcoming appointments, etc.  Non-urgent messages can be sent to your provider as well.   To learn more about what you can do with MyChart, go to [email or web address].    Your next appointment:   4 month(s)  The format for your next appointment:   In Person  Provider:   Carlyle Dolly, MD   Other Instructions We have referred you to Cardiac rehab at [facility]. They will call you to schedule an apt.       Thank you for choosing [facility] !

## 2019-09-07 NOTE — Progress Notes (Signed)
Clinical Summary Tara Bryant is a 49 y.o.female seen today for follow up of the following medical problems.  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  - 08/2018 echo: LVEF 25-30%, grade II diastolic dysfunction, mild RV dysfunction - 10/29/18 ICD implant[employer] - followed by CHF clinic, has had close f/u with CHF pharmacy clinic as well    - occasional SOB at times. Some LE edema at times, feels like abdomen is distended.  - intermittent dizzy spells, often with bending over or standing up.  - sedentary lifestyle over the last several months   2. Pulmonary hypertension - type II, her mean PA was 45 and PCWP 41 in setting of severel volume overload. Moderate RV dysfunction by MRI  3. CAD - moderate nonobstructive disease by cath  - no recent chest pain.    4. Sinus tachcyardia -resolved since last visit. Negative workup. She has had prior visits with sinus tach of unclear etiology.   5. OSA - moderate OSA by 01/2019 sleep study, has f/u with Dr Luan Pulling to arrange cpap  - never was able to arrange cpap machine.  - has appt with pulmonary later in June to resolve this issue.    6. 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    Past Medical History:  Diagnosis Date  . Arrhythmia   . CAD (coronary artery disease)    a. 03/2018: cath showing 60% mid-LAD stenosis not significant by FFR.   Marland Kitchen Chronic combined systolic (congestive) and diastolic (congestive) heart failure ([place])    a. 03/2018: found to have a newly reduced EF of 20% by echocardiogram  . Depression   . Diabetes mellitus   . Hypertension   . Microproteinuria      Allergies  Allergen Reactions  . Aspartame And Phenylalanine Dermatitis     Current Outpatient Medications  Medication Sig Dispense Refill  . apixaban (ELIQUIS) 5 MG TABS tablet Take 1 tablet (5 mg total) by mouth 2 (two) times daily. 180 tablet 3  . atorvastatin (LIPITOR) 10 MG tablet Take 1 tablet (10 mg total) by mouth daily. 30 tablet 5  . B-D ULTRAFINE III SHORT PEN 31G X 8 MM MISC USE FOR INSULIN INJECTIONS TWICE DAILY 100 each 5  . Blood Glucose Monitoring Suppl (ACCU-CHEK GUIDE) w/Device KIT 1 Piece by Does not apply route as directed. 1 kit 0  . furosemide (LASIX) 40 MG tablet Take 60 mg by mouth daily.    Marland Kitchen glipiZIDE (GLUCOTROL) 5 MG tablet Take 1 tablet (5 mg total) by mouth 2 (two) times daily before a meal. 180 tablet 0  . glucose blood test strip 1 each by Other route in the morning, at noon, in the evening, and at bedtime. Use as instructed to check blood sugar four times daily 200 each 2  . insulin aspart protamine - aspart (NOVOLOG 70/30 MIX) (70-30)  100 UNIT/ML FlexPen Inject 0.8 mLs (80 Units total) into the skin 2 (two) times daily. 30 mL 2  . Lancets (ONETOUCH DELICA PLUS [id number]) MISC USE TO TEST BLOOD SUGAR FOUR TIMES DAILY    . metFORMIN (GLUCOPHAGE) 500 MG tablet TAKE 1 TABLET(500 MG) BY MOUTH TWICE DAILY WITH A MEAL 180 tablet 1  . metoprolol succinate (TOPROL-XL) 100 MG 24 hr tablet Take 1&1/2 tablets (150 mg) by mouth daily. Take with or immediately following a meal. 135 tablet 1  . NORLYDA 0.35 MG tablet Take 1 tablet by mouth daily.   12  . sacubitril-valsartan (ENTRESTO) 97-103 MG Take 1 tablet by mouth 2 (two) times daily. 60 tablet 5  .  spironolactone (ALDACTONE) 25 MG tablet Take 0.5 tablets (12.5 mg total) by mouth daily. 90 tablet 3   No current facility-administered medications for this visit.     Past Surgical History:  Procedure Laterality Date  . BACK SURGERY    . BREAST BIOPSY Left 2017   benign  . ICD IMPLANT N/A 10/29/2018   Procedure: ICD IMPLANT;  Surgeon: Evans Lance, MD;  Location: [place] [facility];  Service: Cardiovascular;  Laterality: N/A;  . INTRAVASCULAR PRESSURE WIRE/FFR STUDY N/A 03/24/2018   Procedure: INTRAVASCULAR PRESSURE WIRE/FFR STUDY;  Surgeon: Leonie Man, MD;  Location: [place] [facility];  Service: Cardiovascular;  Laterality: N/A;  . NOSE SURGERY    . RIGHT/LEFT HEART CATH AND CORONARY ANGIOGRAPHY N/A 03/24/2018   Procedure: RIGHT/LEFT HEART CATH AND CORONARY ANGIOGRAPHY;  Surgeon: Leonie Man, MD;  Location: [place] [facility];  Service: Cardiovascular;  Laterality: N/A;  . TONSILLECTOMY    . WISDOM TOOTH EXTRACTION       Allergies  Allergen Reactions  . Aspartame And Phenylalanine Dermatitis      Family History  Problem Relation Age of Onset  . Stroke Sister 83       smoker, diabetic, HTN  . Breast cancer Sister   . Cancer Mother        breast  . Breast cancer Mother   . Hypertension Mother   . Diabetes Other   . Heart attack Other   . Chronic Renal Failure Father      Social History Ms. Kellogg reports that she has never smoked. She has never used smokeless tobacco. Ms. Colegrove reports no history of alcohol use.   Review of Systems CONSTITUTIONAL: No weight loss, fever, chills, weakness or fatigue.  HEENT: Eyes: No visual loss, blurred vision, double vision or yellow sclerae.No hearing loss, sneezing, congestion, runny nose or sore throat.  SKIN: No rash or itching.  CARDIOVASCULAR: per hpi RESPIRATORY: No shortness of breath, cough or sputum.  GASTROINTESTINAL: No anorexia, nausea, vomiting or diarrhea. No abdominal pain or blood.    GENITOURINARY: No burning on urination, no polyuria NEUROLOGICAL: No headache, dizziness, syncope, paralysis, ataxia, numbness or tingling in the extremities. No change in bowel or bladder control.  MUSCULOSKELETAL: No muscle, back pain, joint pain or stiffness.  LYMPHATICS: No enlarged nodes. No history of splenectomy.  PSYCHIATRIC: No history of depression or anxiety.  ENDOCRINOLOGIC: No reports of sweating, cold or heat intolerance. No polyuria or polydipsia.  Marland Kitchen   Physical Examination Today's Vitals   09/07/19 1332  BP: 122/78  Pulse: (!) 108  SpO2: 98%  Weight: 209 lb (94.8 kg)  Height: 5' 8"  (1.727 m)   Body mass index is 31.78 kg/m.  Gen: resting comfortably, no acute distress HEENT: no scleral icterus, pupils equal  round and reactive, no palptable cervical adenopathy,  CV: RRR, no m/r/g, no jvd Resp: Clear to auscultation bilaterally GI: abdomen is soft, non-tender, non-distended, normal bowel sounds, no hepatosplenomegaly MSK: extremities are warm, no edema.  Skin: warm, no rash Neuro:  no focal deficits Psych: appropriate affect   Diagnostic Studies  03/2018 echo LVEF 20%, diffuse hypokinesis, cannot eval diastolic function, mild RV dysfunction, PASP 47, small pericardial effusion  03/2018 LHC/RHC  Prox LAD lesion is 55% stenosed. DFR 0.92  Otherwise angiographically normal coronary arteries.  Hemodynamic findings consistent with severe pulmonary hypertension.  LV end diastolic pressure is severely elevated.  SUMMARY:  Moderate single-vessel disease with roughly 60% mid LAD (DFR 0.92 -not physiologically significant)  Otherwise minimal CAD.  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 (Output 4.48, Index 2.2.)   03/2018 cardiac MRI FINDINGS: 1. Moderately dilated left  ventricle with mild concentric hypertrophy and severe systolic function (LVEF = 27%). Severe diffuse hypokinesis and paradoxical septal motion. There is no late gadolinium enhancement in the left ventricular myocardium. There is significant hyper trabeculation of the left ventricle with ratio of non-compacted to compacted myocardium > 4:1.  LVEDD: 61 mm  LVESD: 51 mm  LVEDV: 170 ml  LVESV: 124 ml  SV: 46 ml  CO: 4.6 L/min  Myocardial mass: 166 g  2. Normal right ventricular size, thickness and moderate systolic dysfunction (LVEF = 30%). There are no regional wall motion abnormalities.  3. Normal left and right atrial size.  4. Normal size of the aortic root, ascending aorta and pulmonary artery.  5. Trivial mitral and mild tricuspid regurgitation.  6. Normal pericardium. Mild circumferential pericardial effusion, moderate amount inferior to the heart (up to 11 mm).  IMPRESSION: 1. Moderately dilated left ventricle with mild concentric hypertrophy and severe systolic function (LVEF = 27%). Severe diffuse hypokinesis and paradoxical septal motion. There is no late gadolinium enhancement in the left ventricular myocardium. There is significant hyper trabeculation of the left ventricle with ratio of non-compacted to compacted myocardium > 4:1.  2. Normal right ventricular size, thickness and moderate systolic dysfunction (LVEF = 30%). There are no regional wall motion abnormalities.  3. Normal left and right atrial size.  4. Normal size of the aortic root, ascending aorta and pulmonary artery.  5. Trivial mitral and mild tricuspid regurgitation.  6. Normal pericardium. Mild circumferential pericardial effusion, moderate amount inferior to the heart (up to 11 mm).  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   08/2018 echo IMPRESSIONS   1. The left ventricle  has severely reduced systolic function, with an ejection fraction of 25-30%. The cavity size was normal. There is mild concentric left ventricular hypertrophy. Left ventricular diastolic Doppler parameters are consistent with  pseudonormalization. Elevated left ventricular end-diastolic pressure Left ventricular diffuse hypokinesis. 2. The left ventricular apex appears trabeculated. 3. The right ventricle has mildly reduced systolic function. The cavity was normal. There is mildly increased right ventricular wall thickness. 4. The mitral valve is grossly normal. Mild thickening of the mitral valve leaflet. Mitral valve regurgitation is mild to moderate by color flow Doppler. 5. The tricuspid valve is grossly normal. 6. The aortic valve is grossly normal. Mild thickening of the aortic valve. 7. The aortic root is normal in size and structure.   01/2019 IMPRESSIONS 1.Moderate obstructive sleep apnea syndrome worse during REM sleep is  noted with this recording. AutoPap 5-12 as recommended.   Assessment and Plan   1.Chronic systolic HF/Noncompaction cardiomyopathy - NICM, noncompaction CM by MRI - has been on anticoag in setting of noncompaction and low LVEF, continue at this time.She has an ICD - medical therapy limited by intermittent dizziness, will not titrate meds at this time. I did ask her to check her sugars at the time of her next dizziness episode - up 10 lbs since 05/2019. She does not appear volume overloaded. Admits sedentary lifestyle style, I suspect primarily this is nonfluid weight gain. Counseled can take her lasix 26m daily with additional 23mas needed for swelling or worsening SOB - refer to cardiac rehab  2. Leg pains - atypical for claudication, I asked her to discuss with her pcp alternative causes - if negative workup by could reconsider ABIs  3. OSA - upcoming appt with pulmonary Dr AlElsworth Sohoo address and help arrange her cpap    JoArnoldo Lenis M.D.

## 2019-09-15 ENCOUNTER — Other Ambulatory Visit: Payer: Self-pay

## 2019-09-15 ENCOUNTER — Encounter: Payer: Self-pay | Admitting: Family Medicine

## 2019-09-15 ENCOUNTER — Ambulatory Visit (INDEPENDENT_AMBULATORY_CARE_PROVIDER_SITE_OTHER): Payer: 59 | Admitting: Family Medicine

## 2019-09-15 VITALS — BP 132/80 | HR 100 | Temp 97.8°F | Ht 68.0 in | Wt 205.0 lb

## 2019-09-15 DIAGNOSIS — M7541 Impingement syndrome of right shoulder: Secondary | ICD-10-CM | POA: Diagnosis not present

## 2019-09-15 MED ORDER — HYDROCODONE-ACETAMINOPHEN 5-325 MG PO TABS: 1.0000 | ORAL_TABLET | Freq: Three times a day (TID) | ORAL | 0 refills | Status: DC | PRN

## 2019-09-15 MED ORDER — TIZANIDINE HCL 2 MG PO TABS: 2.0000 mg | ORAL_TABLET | Freq: Three times a day (TID) | ORAL | 0 refills | Status: DC | PRN

## 2019-09-15 NOTE — Progress Notes (Signed)
[name], female    DOB: July 06, 1970, 49 y.o.   MRN: [id number]   Chief Complaint  Patient presents with  . Arm Pain   Subjective:    HPI  CC-Right arm pain for 5 days. Tried tylenol and heat.  Pt stating both arms hurting last week but the left got better on it's own. The right shoulder and arm are painful when rolling on it at night. No new work out, heavy lifting, or trauma to the arm/shoulder.  currently no numbness or tingling in arm/hand. Has some rt upper back and neck tightness. Sleeping in recliner to avoid rolling on the shoulder at night. No trauma or injury to the shoulder.  No previous surgery to shoulder.  H/o chf and left AICD in place in the past year. Not having chest pain or left arm pain at this time. Pt on eliquis and unable to take nsaids.  Medical History Tara Bryant has a past medical history of Arrhythmia, CAD (coronary artery disease), Chronic combined systolic (congestive) and diastolic (congestive) heart failure ([place]), Depression, Diabetes mellitus, Hypertension, and Microproteinuria.   Outpatient Encounter Medications as of 09/15/2019  Medication Sig  . apixaban (ELIQUIS) 5 MG TABS tablet Take 1 tablet (5 mg total) by mouth 2 (two) times daily.  Marland Kitchen atorvastatin (LIPITOR) 10 MG tablet Take 1 tablet (10 mg total) by mouth daily.  . B-D ULTRAFINE III SHORT PEN 31G X 8 MM MISC USE FOR INSULIN INJECTIONS TWICE DAILY  . Blood Glucose Monitoring Suppl (ACCU-CHEK GUIDE) w/Device KIT 1 Piece by Does not apply route as directed.  . furosemide (LASIX) 40 MG tablet Take  1/2 tablets daily (60 mg)   May take additional 20 mg daily for Shortness of breath  . glipiZIDE (GLUCOTROL) 5 MG tablet Take 1 tablet (5 mg total) by mouth 2 (two) times daily before a meal.  . glucose blood test strip 1 each by Other route in the morning, at noon, in the evening, and at bedtime. Use as instructed to check blood sugar four times daily  . insulin aspart protamine -  aspart (NOVOLOG 70/30 MIX) (70-30) 100 UNIT/ML FlexPen Inject 0.8 mLs (80 Units total) into the skin 2 (two) times daily.  . Lancets (ONETOUCH DELICA PLUS [id number]) MISC USE TO TEST BLOOD SUGAR FOUR TIMES DAILY  . metFORMIN (GLUCOPHAGE) 500 MG tablet TAKE 1 TABLET(500 MG) BY MOUTH TWICE DAILY WITH A MEAL  . metoprolol succinate (TOPROL-XL) 100 MG 24 hr tablet Take 1&1/2 tablets (150 mg) by mouth daily. Take with or immediately following a meal.  . NORLYDA 0.35 MG tablet Take 1 tablet by mouth daily.   . sacubitril-valsartan (ENTRESTO) 97-103 MG Take 1 tablet by mouth 2 (two) times daily.  Marland Kitchen spironolactone (ALDACTONE) 25 MG tablet Take 0.5 tablets (12.5 mg total) by mouth daily.  Marland Kitchen HYDROcodone-acetaminophen (NORCO) 5-325 MG tablet Take 1 tablet by mouth 3 (three) times daily as needed for moderate pain.  Marland Kitchen tiZANidine (ZANAFLEX) 2 MG tablet Take 1 tablet (2 mg total) by mouth every 8 (eight) hours as needed for muscle spasms (caution causes drowsiness).   No facility-administered encounter medications on file as of 09/15/2019.     Review of Systems  Musculoskeletal: Positive for arthralgias (rt arm pain) and back pain (rt upper back pain). Negative for joint swelling.  Skin: Negative for rash.  Neurological: Negative for weakness and numbness.    Vitals BP 132/80   Pulse 100   Temp 97.8 F (36.6 C)  Ht 5' 8" (1.727 m)   Wt 205 lb (93 kg)   SpO2 98%   BMI 31.17 kg/m   Objective:   Physical Exam Vitals and nursing note reviewed.  Constitutional:      General: She is not in acute distress.    Appearance: Normal appearance. She is not ill-appearing.  HENT:     Head: Normocephalic and atraumatic.     Nose: Nose normal.     Mouth/Throat:     Mouth: Mucous membranes are moist.     Pharynx: Oropharynx is clear.  Eyes:     Extraocular Movements: Extraocular movements intact.     Conjunctiva/sclera: Conjunctivae normal.     Pupils: Pupils are equal, round, and reactive to light.    Cardiovascular:     Rate and Rhythm: Normal rate and regular rhythm.     Pulses: Normal pulses.     Heart sounds: Normal heart sounds.  Pulmonary:     Effort: Pulmonary effort is normal. No respiratory distress.     Breath sounds: Normal breath sounds. No wheezing, rhonchi or rales.  Musculoskeletal:        General: Normal range of motion.     Cervical back: Normal range of motion and neck supple. No tenderness.     Right lower leg: No edema.     Left lower leg: No edema.     Comments: +ttp over rt anterior shoulder, + hawkins and neer's on rt.  Dec rom with abduction of rt arm. +ttp over rt cervical paraspinal area and rt trapezius.  Skin:    General: Skin is warm and dry.     Findings: No lesion or rash.  Neurological:     General: No focal deficit present.     Mental Status: She is alert and oriented to person, place, and time.     Cranial Nerves: No cranial nerve deficit.     Sensory: No sensory deficit.     Motor: No weakness.  Psychiatric:        Mood and Affect: Mood normal.        Behavior: Behavior normal.     Assessment and Plan   1. Shoulder impingement syndrome, right - HYDROcodone-acetaminophen (NORCO) 5-325 MG tablet; Take 1 tablet by mouth 3 (three) times daily as needed for moderate pain.  Dispense: 21 tablet; Refill: 0 - tiZANidine (ZANAFLEX) 2 MG tablet; Take 1 tablet (2 mg total) by mouth every 8 (eight) hours as needed for muscle spasms (caution causes drowsiness).  Dispense: 21 tablet; Refill: 0   Unable to take nsaids due to taking Eliquis. Gave small course of norco and zanaflex.  Heat/ice 3x per day and stretches prn.   F/u 2-3 wks if not improving.

## 2019-09-15 NOTE — Patient Instructions (Signed)
Shoulder Pain Many things can cause shoulder pain, including:  An injury.  Moving the shoulder in the same way again and again (overuse).  Joint pain (arthritis). Pain can come from:  Swelling and irritation (inflammation) of any part of the shoulder.  An injury to the shoulder joint.  An injury to: ? Tissues that connect muscle to bone (tendons). ? Tissues that connect bones to each other (ligaments). ? Bones. Follow these instructions at home: Watch for changes in your symptoms. Let your doctor know about them. Follow these instructions to help with your pain. If you have a sling:  Wear the sling as told by your doctor. Remove it only as told by your doctor.  Loosen the sling if your fingers: ? Tingle. ? Become numb. ? Turn cold and blue.  Keep the sling clean.  If the sling is not waterproof: ? Do not let it get wet. ? Take the sling off when you shower or bathe. Managing pain, stiffness, and swelling   If told, put ice on the painful area: ? Put ice in a plastic bag. ? Place a towel between your skin and the bag. ? Leave the ice on for 20 minutes, 2-3 times a day. Stop putting ice on if it does not help with the pain.  Squeeze a soft ball or a foam pad as much as possible. This prevents swelling in the shoulder. It also helps to strengthen the arm. General instructions  Take over-the-counter and prescription medicines only as told by your doctor.  Keep all follow-up visits as told by your doctor. This is important. Contact a doctor if:  Your pain gets worse.  Medicine does not help your pain.  You have new pain in your arm, hand, or fingers. Get help right away if:  Your arm, hand, or fingers: ? Tingle. ? Are numb. ? Are swollen. ? Are painful. ? Turn white or blue. Summary  Shoulder pain can be caused by many things. These include injury, moving the shoulder in the same away again and again, and joint pain.  Watch for changes in your symptoms.  Let your doctor know about them.  This condition may be treated with a sling, ice, and pain medicine.  Contact your doctor if the pain gets worse or you have new pain. Get help right away if your arm, hand, or fingers tingle or get numb, swollen, or painful.  Keep all follow-up visits as told by your doctor. This is important. This information is not intended to replace advice given to you by your health care provider. Make sure you discuss any questions you have with your health care provider. Document Revised: 10/07/2017 Document Reviewed: 10/07/2017 Elsevier Patient Education  2020 [employer]  

## 2019-09-17 ENCOUNTER — Telehealth: Payer: Self-pay | Admitting: Family Medicine

## 2019-09-17 NOTE — Telephone Encounter (Signed)
Pt would like exercises to do for her arm and shoulder. She was seen 6/9.   She thought the exercises would be on her avs

## 2019-09-17 NOTE — Telephone Encounter (Signed)
Shoulder exercises printed and waiting up front for pick up. Pt is aware

## 2019-09-22 ENCOUNTER — Encounter: Payer: Self-pay | Admitting: Family Medicine

## 2019-09-22 ENCOUNTER — Other Ambulatory Visit: Payer: Self-pay | Admitting: *Deleted

## 2019-09-22 ENCOUNTER — Telehealth: Payer: Self-pay | Admitting: Family Medicine

## 2019-09-22 ENCOUNTER — Other Ambulatory Visit: Payer: Self-pay

## 2019-09-22 ENCOUNTER — Ambulatory Visit (INDEPENDENT_AMBULATORY_CARE_PROVIDER_SITE_OTHER): Payer: 59 | Admitting: Family Medicine

## 2019-09-22 VITALS — BP 138/86 | HR 115 | Temp 98.2°F | Wt 206.4 lb

## 2019-09-22 DIAGNOSIS — M7541 Impingement syndrome of right shoulder: Secondary | ICD-10-CM

## 2019-09-22 DIAGNOSIS — Z79899 Other long term (current) drug therapy: Secondary | ICD-10-CM

## 2019-09-22 DIAGNOSIS — E785 Hyperlipidemia, unspecified: Secondary | ICD-10-CM

## 2019-09-22 NOTE — Telephone Encounter (Signed)
Pt ended up coming in office today and was discussed at that time

## 2019-09-22 NOTE — Telephone Encounter (Signed)
Pt is still having right arm pain, it hurts all the time. All activities hurt her arm. I offered pt first avaialble appt which is Friday with Dr. Lovena Le but pt states she would not be able to come in on Friday. Would like to know what Dr. Lovena Le recommends.

## 2019-09-22 NOTE — Telephone Encounter (Signed)
Refer to ortho.  (do they do walkins?  Thx. Dr Lovena Le

## 2019-09-22 NOTE — Progress Notes (Signed)
[name], female    DOB: 18-Jul-1970, 49 y.o.   MRN: [id number]   Chief Complaint  Patient presents with  . Follow-up    Continued right arm pain. Patient states it has not gotten any better and she is still having trouble sleeping with it. Constant pain, medications just seem to make her sleepy. Heat helps slightly but doesn't last long. Patient feels like her shoulder/chest area may feel slightly swollen on the right side as well.    Subjective:    HPI Pt continuing to have rt shoulder pain.  Seen on 09/15/19 for the rt shoulder pain.  Pain with laying on right side.  zanaflex making her too sleeping during day.  Taking hydrocodone 2x per day. Pt stating still 10/10 on pain scale even with pain meds. unable to take nsaids due to blood thinner. Feeling pain radiating down the arm and into the top of right chest on palpation. No chest pain or sob. Pt reports sleeping in recliner to stop laying on right side.  Has been using heat/ice.    Medical History Tara Bryant has a past medical history of Arrhythmia, CAD (coronary artery disease), Chronic combined systolic (congestive) and diastolic (congestive) heart failure ([place]), Depression, Diabetes mellitus, Hypertension, and Microproteinuria.   Outpatient Encounter Medications as of 09/22/2019  Medication Sig  . apixaban (ELIQUIS) 5 MG TABS tablet Take 1 tablet (5 mg total) by mouth 2 (two) times daily.  Marland Kitchen atorvastatin (LIPITOR) 10 MG tablet Take 1 tablet (10 mg total) by mouth daily.  . B-D ULTRAFINE III SHORT PEN 31G X 8 MM MISC USE FOR INSULIN INJECTIONS TWICE DAILY  . Blood Glucose Monitoring Suppl (ACCU-CHEK GUIDE) w/Device KIT 1 Piece by Does not apply route as directed.  . furosemide (LASIX) 40 MG tablet Take  1/2 tablets daily (60 mg)   May take additional 20 mg daily for Shortness of breath  . glipiZIDE (GLUCOTROL) 5 MG tablet Take 1 tablet (5 mg total) by mouth 2 (two) times daily before a meal.  . glucose blood test  strip 1 each by Other route in the morning, at noon, in the evening, and at bedtime. Use as instructed to check blood sugar four times daily  . HYDROcodone-acetaminophen (NORCO) 5-325 MG tablet Take 1 tablet by mouth 3 (three) times daily as needed for moderate pain.  Marland Kitchen insulin aspart protamine - aspart (NOVOLOG 70/30 MIX) (70-30) 100 UNIT/ML FlexPen Inject 0.8 mLs (80 Units total) into the skin 2 (two) times daily.  . Lancets (ONETOUCH DELICA PLUS [id number]) MISC USE TO TEST BLOOD SUGAR FOUR TIMES DAILY  . metFORMIN (GLUCOPHAGE) 500 MG tablet TAKE 1 TABLET(500 MG) BY MOUTH TWICE DAILY WITH A MEAL  . metoprolol succinate (TOPROL-XL) 100 MG 24 hr tablet Take 1&1/2 tablets (150 mg) by mouth daily. Take with or immediately following a meal.  . NORLYDA 0.35 MG tablet Take 1 tablet by mouth daily.   . sacubitril-valsartan (ENTRESTO) 97-103 MG Take 1 tablet by mouth 2 (two) times daily.  Marland Kitchen spironolactone (ALDACTONE) 25 MG tablet Take 0.5 tablets (12.5 mg total) by mouth daily.  Marland Kitchen tiZANidine (ZANAFLEX) 2 MG tablet Take 1 tablet (2 mg total) by mouth every 8 (eight) hours as needed for muscle spasms (caution causes drowsiness).   No facility-administered encounter medications on file as of 09/22/2019.     Review of Systems  Constitutional: Negative for chills and fever.  HENT: Negative for congestion, rhinorrhea and sore throat.   Respiratory: Negative for  cough, shortness of breath and wheezing.   Cardiovascular: Negative for chest pain and leg swelling.  Gastrointestinal: Negative for abdominal pain, diarrhea, nausea and vomiting.  Genitourinary: Negative for dysuria and frequency.  Musculoskeletal: Positive for arthralgias (rt shoulder pain) and neck stiffness. Negative for back pain.  Skin: Negative for rash.  Neurological: Negative for dizziness, weakness, numbness and headaches.     Vitals BP 138/86   Pulse (!) 115   Temp 98.2 F (36.8 C)   Wt 206 lb 6.4 oz (93.6 kg)   SpO2 100%   BMI  31.38 kg/m   Objective:   Physical Exam Constitutional:      General: She is not in acute distress.    Appearance: Normal appearance. She is not ill-appearing.  Musculoskeletal:        General: Tenderness (ttp in rt shoulder.  ) present. No swelling or deformity.     Comments: Dec rom with rt shoulder, pain with abduction. +hawkins/neers. Normal rom of rt elbow/wrist/hand.   Skin:    General: Skin is warm and dry.     Findings: No erythema, lesion or rash.  Neurological:     General: No focal deficit present.     Mental Status: She is alert.      Assessment and Plan   1. Shoulder impingement syndrome, right - Ambulatory referral to Orthopedic Surgery   Difficult to treat patient she has anticoag tx on board, eliuquis and unable to take NSAIDs.  Has DM2 and prednisone not a good option.  Pt given walk in for [facility] for possible injection and xray, open today for walk-ins.  Pt wanting to see Dr. Aline Brochure ortho.  Put in referral and if not able to get in soon, to go to the walk in clinic. Pt advised to prop the arm, and avoid laying on rt side.  Take the norco 1-2x per day and tylenol 571m 3x per day prn.  Heat/ice the arm.  Pt in agreement with plan.   F/u prn.

## 2019-09-22 NOTE — Telephone Encounter (Signed)
Pt would like referral switched to [facility] [place].   [facility] in [place] can not see her until July and [facility] in [place] would be able to see her this week.

## 2019-09-22 NOTE — Telephone Encounter (Signed)
Pt seen 6/9 for shoulder impingement. States she is only taking hyrdocodone once per day and zanaflex as needed bc it makes her sleepy. She is doing the heat and ice 3 times a day but does not seem to help. Hard to sleep at night because of the pain. Note states to follow up in 2 -3 weeks if not better but she states she cannot wait that long and would like to see what dr taylor recommends. Front staff offered appt but pt could not make it.

## 2019-09-23 NOTE — Telephone Encounter (Signed)
Pt decided to see Dr. Luna Glasgow, see note in referral

## 2019-09-28 ENCOUNTER — Ambulatory Visit: Payer: 59

## 2019-09-28 ENCOUNTER — Ambulatory Visit (INDEPENDENT_AMBULATORY_CARE_PROVIDER_SITE_OTHER): Payer: 59 | Admitting: Orthopaedic Surgery

## 2019-09-28 ENCOUNTER — Telehealth: Payer: Self-pay | Admitting: Radiology

## 2019-09-28 ENCOUNTER — Other Ambulatory Visit: Payer: Self-pay

## 2019-09-28 ENCOUNTER — Encounter: Payer: Self-pay | Admitting: Orthopaedic Surgery

## 2019-09-28 VITALS — BP 153/93 | HR 90 | Ht 68.0 in | Wt 205.0 lb

## 2019-09-28 DIAGNOSIS — Z029 Encounter for administrative examinations, unspecified: Secondary | ICD-10-CM

## 2019-09-28 DIAGNOSIS — M542 Cervicalgia: Secondary | ICD-10-CM

## 2019-09-28 DIAGNOSIS — G8929 Other chronic pain: Secondary | ICD-10-CM

## 2019-09-28 DIAGNOSIS — Z7901 Long term (current) use of anticoagulants: Secondary | ICD-10-CM | POA: Diagnosis not present

## 2019-09-28 DIAGNOSIS — M25511 Pain in right shoulder: Secondary | ICD-10-CM | POA: Diagnosis not present

## 2019-09-28 NOTE — Progress Notes (Addendum)
Subjective:    [name], female    DOB: [birth year], 49 y.o.   MRN: [id number]  HPI  She began having pain in the right shoulder and neck area around June 5.  It got worse. She was seen by Dr. Lovena Le on June 9.  She was given prednisone which did not help  She saw Dr. Lovena Le again on June 16.  The pain was worse and no better.  She has paresthesias to the right hand. She has good motion of the shoulder.  She was referred here.  I have read Dr. Tanna Furry notes.  She has no trauma, no redness, no weakness. She has heart disease and is on multiple medicines for this including Eliquis.  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 She cannot take NSAIDs.    Review of Systems  Constitutional: Positive for activity change.  Cardiovascular: Positive for chest pain and palpitations.  Musculoskeletal: Positive for arthralgias, myalgias and neck pain.  All other systems reviewed and are negative.  For Review of Systems, all other systems reviewed and are negative.  The following is a summary of the past history medically, past history surgically, known current medicines, social history and family history.  This information is gathered electronically by the computer from prior information and documentation.  I review this each visit and have found including this information at this point in the chart is beneficial and informative.   Past Medical History:  Diagnosis Date  . Arrhythmia   . CAD (coronary artery disease)    a. 03/2018: cath showing 60% mid-LAD stenosis not significant by FFR.   Marland Kitchen Chronic combined systolic (congestive) and diastolic (congestive) heart failure ([place])    a. 03/2018: found to have a newly reduced EF of 20% by echocardiogram  . Depression   . Diabetes mellitus   . Hypertension   . Microproteinuria     Past Surgical History:  Procedure Laterality Date  . BACK SURGERY    . BREAST BIOPSY Left 2017   benign  . ICD IMPLANT N/A 10/29/2018    Procedure: ICD IMPLANT;  Surgeon: Evans Lance, MD;  Location: [facility];  Service: Cardiovascular;  Laterality: N/A;  . INTRAVASCULAR PRESSURE WIRE/FFR STUDY N/A 03/24/2018   Procedure: INTRAVASCULAR PRESSURE WIRE/FFR STUDY;  Surgeon: Leonie Man, MD;  Location: [facility];  Service: Cardiovascular;  Laterality: N/A;  . NOSE SURGERY    . RIGHT/LEFT HEART CATH AND CORONARY ANGIOGRAPHY N/A 03/24/2018   Procedure: RIGHT/LEFT HEART CATH AND CORONARY ANGIOGRAPHY;  Surgeon: Leonie Man, MD;  Location: [facility];  Service: Cardiovascular;  Laterality: N/A;  . TONSILLECTOMY    . WISDOM TOOTH EXTRACTION      Current Outpatient Medications on File Prior to Visit  Medication Sig Dispense Refill  . apixaban (ELIQUIS) 5 MG TABS tablet Take 1 tablet (5 mg total) by mouth 2 (two) times daily. 180 tablet 3  . atorvastatin (LIPITOR) 10 MG tablet Take 1 tablet (10 mg total) by mouth daily. 30 tablet 5  . B-D ULTRAFINE III SHORT PEN 31G X 8 MM MISC USE FOR INSULIN INJECTIONS TWICE DAILY 100 each 5  . Blood Glucose Monitoring Suppl (ACCU-CHEK GUIDE) w/Device KIT 1 Piece by Does not apply route as directed. 1 kit 0  . furosemide (LASIX) 40 MG tablet Take  1/2 tablets daily (60 mg)   May take additional 20 mg daily for Shortness of breath 100 tablet 3  . glipiZIDE (  GLUCOTROL) 5 MG tablet Take 1 tablet (5 mg total) by mouth 2 (two) times daily before a meal. 180 tablet 0  . glucose blood test strip 1 each by Other route in the morning, at noon, in the evening, and at bedtime. Use as instructed to check blood sugar four times daily 200 each 2  . HYDROcodone-acetaminophen (NORCO) 5-325 MG tablet Take 1 tablet by mouth 3 (three) times daily as needed for moderate pain. 21 tablet 0  . insulin aspart protamine - aspart (NOVOLOG 70/30 MIX) (70-30) 100 UNIT/ML FlexPen Inject 0.8 mLs (80 Units total) into the skin 2 (two) times daily. 30 mL 2  . Lancets (ONETOUCH DELICA PLUS [id number])  MISC USE TO TEST BLOOD SUGAR FOUR TIMES DAILY    . metFORMIN (GLUCOPHAGE) 500 MG tablet TAKE 1 TABLET(500 MG) BY MOUTH TWICE DAILY WITH A MEAL 180 tablet 1  . metoprolol succinate (TOPROL-XL) 100 MG 24 hr tablet Take 1&1/2 tablets (150 mg) by mouth daily. Take with or immediately following a meal. 135 tablet 1  . NORLYDA 0.35 MG tablet Take 1 tablet by mouth daily.   12  . sacubitril-valsartan (ENTRESTO) 97-103 MG Take 1 tablet by mouth 2 (two) times daily. 60 tablet 5  . spironolactone (ALDACTONE) 25 MG tablet Take 0.5 tablets (12.5 mg total) by mouth daily. 90 tablet 3  . tiZANidine (ZANAFLEX) 2 MG tablet Take 1 tablet (2 mg total) by mouth every 8 (eight) hours as needed for muscle spasms (caution causes drowsiness). 21 tablet 0   No current facility-administered medications on file prior to visit.    Social History   Socioeconomic History  . Marital status: Married    Spouse name: Not on file  . Number of children: Not on file  . Years of education: Not on file  . Highest education level: Not on file  Occupational History  . Not on file  Tobacco Use  . Smoking status: Never Smoker  . Smokeless tobacco: Never Used  Vaping Use  . Vaping Use: Never used  Substance and Sexual Activity  . Alcohol use: No  . Drug use: No  . Sexual activity: Yes    Birth control/protection: Pill  Other Topics Concern  . Not on file  Social History Narrative  . Not on file   Social Determinants of Health   Financial Resource Strain:   . Difficulty of Paying Living Expenses:   Food Insecurity:   . Worried About Charity fundraiser in the Last Year:   . Arboriculturist in the Last Year:   Transportation Needs:   . Film/video editor (Medical):   Marland Kitchen Lack of Transportation (Non-Medical):   Physical Activity:   . Days of Exercise per Week:   . Minutes of Exercise per Session:   Stress:   . Feeling of Stress :   Social Connections:   . Frequency of Communication with Friends and Family:     . Frequency of Social Gatherings with Friends and Family:   . Attends Religious Services:   . Active Member of Clubs or Organizations:   . Attends Archivist Meetings:   Marland Kitchen Marital Status:   Intimate Partner Violence:   . Fear of Current or Ex-Partner:   . Emotionally Abused:   Marland Kitchen Physically Abused:   . Sexually Abused:     Family History  Problem Relation Age of Onset  . Stroke Sister 20       smoker, diabetic, HTN  .  Breast cancer Sister   . Cancer Mother        breast  . Breast cancer Mother   . Hypertension Mother   . Diabetes Other   . Heart attack Other   . Chronic Renal Failure Father     BP (!) 153/93   Pulse 90   Ht 5' 8"  (1.727 m)   Wt 205 lb (93 kg)   BMI 31.17 kg/m   Body mass index is 31.17 kg/m.     Objective:   Physical Exam Vitals and nursing note reviewed.  Constitutional:      Appearance: She is well-developed.  HENT:     Head: Normocephalic and atraumatic.  Eyes:     Conjunctiva/sclera: Conjunctivae normal.     Pupils: Pupils are equal, round, and reactive to light.  Cardiovascular:     Rate and Rhythm: Normal rate and regular rhythm.  Pulmonary:     Effort: Pulmonary effort is normal.  Abdominal:     Palpations: Abdomen is soft.  Musculoskeletal:       Arms:     Cervical back: Normal range of motion and neck supple.  Skin:    General: Skin is warm and dry.  Neurological:     Mental Status: She is alert and oriented to person, place, and time.     Cranial Nerves: No cranial nerve deficit.     Motor: No abnormal muscle tone.     Coordination: Coordination normal.     Deep Tendon Reflexes: Reflexes are normal and symmetric. Reflexes normal.  Psychiatric:        Behavior: Behavior normal.        Thought Content: Thought content normal.        Judgment: Judgment normal.    X-rays were done of the cervical spine and shoulder, reported separately.       Assessment & Plan:   Encounter Diagnoses  Name Primary?  .  Chronic pain in right shoulder Yes  . Neck pain on right side   . On continuous oral anticoagulation    I will begin PT.  I am concerned about cervical spine disc/nerve impingement.  She cannot take NSAIDS.  She may need MRI.  Return in three weeks.  Call if any problem.  Precautions discussed.   Electronically Signed Sanjuana Kava, MD 6/22/20219:02 AM

## 2019-09-28 NOTE — Telephone Encounter (Signed)
Patient called, wanted to ask you about the cream she discussed with Dr Luna Glasgow.   She wants to ask about the arthritis that she has and also something about her pain.   Please call her?  Thanks.

## 2019-09-29 ENCOUNTER — Telehealth: Payer: Self-pay

## 2019-09-29 NOTE — Telephone Encounter (Signed)
Patient is asking should Aspercreme burn when she applies it on her shoulder, neck? She is also asking if there thing she can take for the pain. Stated PCP has her on Hydrocodone that doesn't help. I suggested continuing the rubs, try heat, ice and massaging it when she uses the rub. I explained to her that it will take time and several applications of the rub to help.  Please advise

## 2019-09-30 ENCOUNTER — Encounter: Payer: Self-pay | Admitting: Pulmonary Disease

## 2019-09-30 ENCOUNTER — Ambulatory Visit (INDEPENDENT_AMBULATORY_CARE_PROVIDER_SITE_OTHER): Payer: 59 | Admitting: Pulmonary Disease

## 2019-09-30 ENCOUNTER — Other Ambulatory Visit: Payer: Self-pay

## 2019-09-30 ENCOUNTER — Ambulatory Visit (HOSPITAL_COMMUNITY): Payer: 59 | Attending: Orthopaedic Surgery | Admitting: Occupational Therapy

## 2019-09-30 DIAGNOSIS — I5022 Chronic systolic (congestive) heart failure: Secondary | ICD-10-CM

## 2019-09-30 DIAGNOSIS — G4733 Obstructive sleep apnea (adult) (pediatric): Secondary | ICD-10-CM

## 2019-09-30 DIAGNOSIS — R29898 Other symptoms and signs involving the musculoskeletal system: Secondary | ICD-10-CM | POA: Insufficient documentation

## 2019-09-30 DIAGNOSIS — M25511 Pain in right shoulder: Secondary | ICD-10-CM | POA: Diagnosis present

## 2019-09-30 NOTE — Patient Instructions (Signed)
  1) Seated Row   Sit up straight with elbows by your sides. Pull back with shoulders/elbows, keeping forearms straight, as if pulling back on the reins of a horse. Squeeze shoulder blades together. Repeat _10-15__times, __2-3__sets/day    2) Shoulder Elevation    Sit up straight with arms by your sides. Slowly bring your shoulders up towards your ears. Repeat_10-15__times, __2-3__ sets/day    3) Shoulder Extension    Sit up straight with both arms by your side, draw your arms back behind your waist. Keep your elbows straight. Repeat __10-15__times, __2-3__sets/day.     1) AROM: Lateral Neck Flexion   Slowly tilt head toward one shoulder, then the other. Hold each position __3__ seconds. Repeat __10__ times per set. Do ___1_ sets per session. Do __2__ sessions per day.  http://orth.exer.us/296   Copyright  VHI. All rights reserved.  2) AROM: Neck Extension   Bend head backward. Hold __3__ seconds. Repeat __10__ times per set. Do _1___ sets per session. Do __2__ sessions per day.  http://orth.exer.us/300   Copyright  VHI. All rights reserved.  3) AROM: Neck Flexion   Bend head forward. Hold __3__ seconds. Repeat _10___ times per set. Do __1__ sets per session. Do __2__ sessions per day.  http://orth.exer.us/298   Copyright  VHI. All rights reserved.  4) AROM: Neck Rotation   Turn head slowly to look over one shoulder, then the other. Hold each position __3__ seconds. Repeat __10__ times per set. Do _1___ sets per session. Do __2__ sessions per day.  http://orth.exer.us/294   Copyright  VHI. All rights reserved.

## 2019-09-30 NOTE — Assessment & Plan Note (Signed)
Benefits of CPAP for systolic heart failure were discussed  if download shows significant central apneas then she will need formal CPAP titration

## 2019-09-30 NOTE — Therapy (Signed)
[place], [place], [address] Phone: [phone]   Fax:  [phone]  Occupational Therapy Evaluation  Patient Details  Name: Tara Bryant MRN: [id number] Date of Birth: [birth year] Referring Provider (OT): Dr. Sanjuana Kava   Encounter Date: 09/30/2019   OT End of Session - 09/30/19 1237    Visit Number 1    Number of Visits 6    Date for OT Re-Evaluation 10/22/19    Authorization Type [employer]    Authorization Time Period 30 visit limit OT/PT combined    Authorization - Visit Number 1    Authorization - Number of Visits 30    OT Start Time 4268    OT Stop Time 1159    OT Time Calculation (min) 40 min    Activity Tolerance Patient tolerated treatment well    Behavior During Therapy [facility] for tasks assessed/performed           Past Medical History:  Diagnosis Date  . Arrhythmia   . CAD (coronary artery disease)    a. 03/2018: cath showing 60% mid-LAD stenosis not significant by FFR.   Marland Kitchen Chronic combined systolic (congestive) and diastolic (congestive) heart failure ([place])    a. 03/2018: found to have a newly reduced EF of 20% by echocardiogram  . Depression   . Diabetes mellitus   . Hypertension   . Microproteinuria     Past Surgical History:  Procedure Laterality Date  . BACK SURGERY    . BREAST BIOPSY Left 2017   benign  . ICD IMPLANT N/A 10/29/2018   Procedure: ICD IMPLANT;  Surgeon: Evans Lance, MD;  Location: [place] [facility];  Service: Cardiovascular;  Laterality: N/A;  . INTRAVASCULAR PRESSURE WIRE/FFR STUDY N/A 03/24/2018   Procedure: INTRAVASCULAR PRESSURE WIRE/FFR STUDY;  Surgeon: Leonie Man, MD;  Location: [place] [facility];  Service: Cardiovascular;  Laterality: N/A;  . NOSE SURGERY    . RIGHT/LEFT HEART CATH AND CORONARY ANGIOGRAPHY N/A 03/24/2018   Procedure: RIGHT/LEFT HEART CATH AND CORONARY ANGIOGRAPHY;  Surgeon: Leonie Man, MD;  Location: [place] [facility];  Service: Cardiovascular;  Laterality: N/A;  . TONSILLECTOMY    . WISDOM TOOTH EXTRACTION      There were no vitals filed for this visit.   Subjective Assessment - 09/30/19 1122    Subjective  S: It just started hurting for no reason.    Pertinent History Pt is a 49 y/o female presenting with right shoulder pain present since 09/10/19, no known cause. Pt has pain radiating from shoulder down to hand and fingers, as well as to neck region. Pt had x-ray, has not had MRI, reports suspected pinched nerve in her cervical neck given degenerative changes from C5 to C7 and anterior spurring. Pt was referred to occupational therapy for evaluation and treatment by Dr. Sanjuana Kava.    Limitations pt has implanted defibrillator    Special Tests FOTO: 50/100    Patient Stated Goals To have less pain in my arm    Currently in Pain? Yes    Pain Score 8     Pain Location Shoulder    Pain Orientation Right    Pain Descriptors / Indicators Aching;Sore    Pain Type Acute pain    Pain Radiating Towards elbow, hand, fingers    Pain Onset 1 to 4 weeks ago    Pain Frequency Constant    Aggravating Factors  heat, ice, driving  Pain Relieving Factors nothing    Effect of Pain on Daily Activities mod effect on ADLs    Multiple Pain Sites No             [facility] OT Assessment - 09/30/19 1121      Assessment   Medical Diagnosis right shoulder pain    Referring Provider (OT) Dr. Sanjuana Kava    Onset Date/Surgical Date 09/10/19    Hand Dominance Right    Next MD Visit 10/19/19    Prior Therapy None      Precautions   Precautions None      Restrictions   Weight Bearing Restrictions No      Balance Screen   Has the patient fallen in the past 6 months No    Has the patient had a decrease in activity level because of a fear of falling?  No    Is the patient reluctant to leave their home because of a fear of falling?  No      Prior Function   Level of Independence Independent    Vocation  Unemployed    Leisure none      ADL   ADL comments Pt is having difficulty with sleeping, reaching, writing, bathing, dressing, turning head (worse to the left)      Written Expression   Dominant Hand Right      Cognition   Overall Cognitive Status Within Functional Limits for tasks assessed      Observation/Other Assessments   Focus on Therapeutic Outcomes (FOTO)  50/100      ROM / Strength   AROM / PROM / Strength AROM;PROM;Strength      Palpation   Palpation comment Mod to max fascial restrictions in right upper arm, anterior shoulder, scapular, and cervical neck regions      AROM   Overall AROM Comments Assessed seated, er/IR adducted    AROM Assessment Site Shoulder;Cervical    Right/Left Shoulder Right    Right Shoulder Flexion 105 Degrees    Right Shoulder ABduction 120 Degrees    Right Shoulder Internal Rotation 90 Degrees    Right Shoulder External Rotation 80 Degrees    Cervical Flexion 45   WNL   Cervical Extension 15    Cervical - Right Side Bend 19    Cervical - Left Side Bend 16    Cervical - Right Rotation 40    Cervical - Left Rotation 30      PROM   Overall PROM Comments Assessed supine, er/IR adducted    PROM Assessment Site Shoulder    Right/Left Shoulder Right    Right Shoulder Flexion 115 Degrees    Right Shoulder ABduction 86 Degrees    Right Shoulder Internal Rotation 90 Degrees    Right Shoulder External Rotation 80 Degrees      Strength   Overall Strength Comments Assessed supine, er/IR adducted    Strength Assessment Site Shoulder    Right/Left Shoulder Right    Right Shoulder Flexion 4+/5    Right Shoulder ABduction 4-/5    Right Shoulder Internal Rotation 4+/5    Right Shoulder External Rotation 4/5                           OT Education - 09/30/19 1148    Education Details scapular and cervical neck A/ROM    Person(s) Educated Patient    Methods Explanation;Demonstration;Handout    Comprehension Verbalized  understanding;Returned demonstration  OT Short Term Goals - 09/30/19 1241      OT SHORT TERM GOAL #1   Title Pt will be provided with and educated on HEP to improve mobility of RUE and cervical neck regions required for ADL completion.    Time 3    Period Weeks    Status New    Target Date 10/22/19      OT SHORT TERM GOAL #2   Title Pt will increase RUE A/ROM to [facility] to improve ability to reach overhead and behind back during dressing tasks.    Time 3    Period Weeks    Status New      OT SHORT TERM GOAL #3   Title Pt will increase cervical A/ROM to [facility] to improve ability to turn head to look over shoulder when driving.    Time 3    Period Weeks    Status New      OT SHORT TERM GOAL #4   Title Pt will decrease RUE fascial restrictions to minimal amounts to improve mobility required for functional reaching tasks.    Time 3    Period Weeks    Status New      OT SHORT TERM GOAL #5   Title Pt will increase RUE strength to 4+/5 or greater to improve ability to perform light lifting tasks during ADLs.    Time 3    Period Weeks    Status New      Additional Short Term Goals   Additional Short Term Goals Yes      OT SHORT TERM GOAL #6   Title Pt will decrease RUE and cervical neck pain to 4/10 or less to improve ability to sleep in the bed versus the recliner.    Time 3    Period Weeks    Status New                    Plan - 09/30/19 1238    Clinical Impression Statement A: Pt is a 49 y/o female presenting with right shoulder pain, radicular from cervical neck down to right hand and fingers. Pt complains of pain in all positions, nothing helps alleviate the pain. Pt with limitations in functional task completion during ADLs, is not using RUE as dominant.    OT Occupational Profile and History Problem Focused Assessment - Including review of records relating to presenting problem    Occupational performance deficits (Please refer to evaluation for  details): ADL's;IADL's;Rest and Sleep;Leisure    Body Structure / Function / Physical Skills ADL;Endurance;UE functional use;Fascial restriction;Pain;ROM;IADL;Strength;Mobility    Rehab Potential Good    Clinical Decision Making Limited treatment options, no task modification necessary    Comorbidities Affecting Occupational Performance: None    Modification or Assistance to Complete Evaluation  No modification of tasks or assist necessary to complete eval    OT Frequency 2x / week    OT Duration --   3 weeks   OT Treatment/Interventions Self-care/ADL training;Ultrasound;DME and/or AE instruction;Patient/family education;Passive range of motion;Cryotherapy;Electrical Stimulation;Moist Heat;Therapeutic exercise;Manual Therapy;Therapeutic activities    Plan P: Pt will benefit from skilled OT services to decrease pain and fascial restrictions, increase joint ROM, strength, and functional use of RUE as dominant. Treatment plan: myofascial release and manual techniques, P/ROM, AA/ROM, A/ROM, general RUE strengthening, scapular mobility/stability/strengthening, modalities prn           Patient will benefit from skilled therapeutic intervention in order to improve the following deficits and impairments:  Body Structure / Function / Physical Skills: ADL, Endurance, UE functional use, Fascial restriction, Pain, ROM, IADL, Strength, Mobility       Visit Diagnosis: Acute pain of right shoulder  Other symptoms and signs involving the musculoskeletal system    Problem List Patient Active Problem List   Diagnosis Date Noted  . OSA (obstructive sleep apnea) 09/30/2019  . Chronic systolic heart failure ([place]) 02/04/2019  . ICD (implantable cardioverter-defibrillator) in place 02/04/2019  . Uncontrolled type 2 diabetes mellitus with hyperglycemia ([place]) 06/10/2018  . Mixed hyperlipidemia 06/10/2018  . Family history of left ventricular noncompaction   . Dilated cardiomyopathy ([place]): EF 20%, global  hypokinesis. 03/23/2018  . Acute combined systolic and diastolic heart failure ([place]) 03/23/2018  . Hirsutism 11/04/2016  . Perimenopause 07/28/2015  . Irritable bowel syndrome 09/01/2014  . Gastritis 05/11/2014  . Hypertriglyceridemia, essential 03/08/2014  . Type 2 diabetes mellitus without complication, with long-term current use of insulin ([place]) 06/29/2013  . Pain in joint, lower leg 12/01/2012  . Venous stasis of lower extremity 12/01/2012  . Depression 12/01/2012  . Essential hypertension, benign 07/30/2012   Guadelupe Sabin, OTR/L  [phone] 09/30/2019, 12:47 PM  [place], [place], [address] Phone: [phone]   Fax:  [phone]  Name: Tara Bryant MRN: [id number] Date of Birth: August 04, 1970

## 2019-09-30 NOTE — Telephone Encounter (Signed)
If the Aspercreme does not work well, try one of the other rubs. Hydrocodone is a strong narcotic.

## 2019-09-30 NOTE — Progress Notes (Signed)
Subjective:    [name], female    DOB: April 11, 1970, 49 y.o.   MRN: [id number]  HPI   Chief Complaint  Patient presents with  . Consult    daytime sleepiness, difficult to fall asleep at night, snores   49 year old diabetic with nonischemic cardiomyopathy presents to establish care for OSA She has chronic systolic heart failure and follows up with the CHF clinic, status post AICD.  This is supposed to be genetic but no genetic testing has been performed yet. She presented with excessive daytime somnolence and loud snoring especially on her back noted by family members Epworth sleepiness score is 11 and she reports sleepiness while sitting and reading and watching TV.  Bedtime is after 11 PM, sleep latency is variable, she prefers to sleep on her side but invariably ends up on her back , denies nocturnal awakenings and is out of bed between 6 and 8 AM feeling tired with dryness of mouth. Over the past few weeks she has developed a pinched nerve in her neck, MRI pending, due to which she sleeps in a recliner.  There is no history suggestive of cataplexy, sleep paralysis or parasomnias  We reviewed sleep study. She has gained about 10 pounds since then and is not sure if this is water weight.  She is compliant with Lasix 60 to 80 mg daily  There is no history suggestive of cataplexy, sleep paralysis or parasomnias  She is a stay-at-home mom, brother and her father have OSA and use CPAP  Reviewed notes from CHF clinic and cardiology and endocrine Significant tests/ events reviewed  01/2019 NPSG >> wt 195 lbs - AHI 17.6/hour , 115 RERAs, supine AHI 35/hour  Past Medical History:  Diagnosis Date  . Arrhythmia   . CAD (coronary artery disease)    a. 03/2018: cath showing 60% mid-LAD stenosis not significant by FFR.   Marland Kitchen Chronic combined systolic (congestive) and diastolic (congestive) heart failure ([place])    a. 03/2018: found to have a newly reduced EF of 20% by  echocardiogram  . Depression   . Diabetes mellitus   . Hypertension   . Microproteinuria    Past Surgical History:  Procedure Laterality Date  . BACK SURGERY    . BREAST BIOPSY Left 2017   benign  . ICD IMPLANT N/A 10/29/2018   Procedure: ICD IMPLANT;  Surgeon: Evans Lance, MD;  Location: [place] [facility];  Service: Cardiovascular;  Laterality: N/A;  . INTRAVASCULAR PRESSURE WIRE/FFR STUDY N/A 03/24/2018   Procedure: INTRAVASCULAR PRESSURE WIRE/FFR STUDY;  Surgeon: Leonie Man, MD;  Location: [place] [facility];  Service: Cardiovascular;  Laterality: N/A;  . NOSE SURGERY    . RIGHT/LEFT HEART CATH AND CORONARY ANGIOGRAPHY N/A 03/24/2018   Procedure: RIGHT/LEFT HEART CATH AND CORONARY ANGIOGRAPHY;  Surgeon: Leonie Man, MD;  Location: [place] [facility];  Service: Cardiovascular;  Laterality: N/A;  . TONSILLECTOMY    . WISDOM TOOTH EXTRACTION      Allergies  Allergen Reactions  . Aspartame And Phenylalanine Dermatitis    Social History   Socioeconomic History  . Marital status: Married    Spouse name: Not on file  . Number of children: Not on file  . Years of education: Not on file  . Highest education level: Not on file  Occupational History  . Not on file  Tobacco Use  . Smoking status: Never Smoker  . Smokeless tobacco: Never Used  Vaping Use  .  Vaping Use: Never used  Substance and Sexual Activity  . Alcohol use: No  . Drug use: No  . Sexual activity: Yes    Birth control/protection: Pill  Other Topics Concern  . Not on file  Social History Narrative  . Not on file   Social Determinants of Health   Financial Resource Strain:   . Difficulty of Paying Living Expenses:   Food Insecurity:   . Worried About Charity fundraiser in the Last Year:   . Arboriculturist in the Last Year:   Transportation Needs:   . Film/video editor (Medical):   Marland Kitchen Lack of Transportation (Non-Medical):   Physical Activity:   . Days of Exercise per Week:   .  Minutes of Exercise per Session:   Stress:   . Feeling of Stress :   Social Connections:   . Frequency of Communication with Friends and Family:   . Frequency of Social Gatherings with Friends and Family:   . Attends Religious Services:   . Active Member of Clubs or Organizations:   . Attends Archivist Meetings:   Marland Kitchen Marital Status:   Intimate Partner Violence:   . Fear of Current or Ex-Partner:   . Emotionally Abused:   Marland Kitchen Physically Abused:   . Sexually Abused:      Family History  Problem Relation Age of Onset  . Stroke Sister 63       smoker, diabetic, HTN  . Breast cancer Sister   . Cancer Mother        breast  . Breast cancer Mother   . Hypertension Mother   . Diabetes Other   . Heart attack Other   . Chronic Renal Failure Father       Review of Systems Constitutional: negative for anorexia, fevers and sweats  Eyes: negative for irritation, redness and visual disturbance  Ears, nose, mouth, throat, and face: negative for earaches, epistaxis, nasal congestion and sore throat  Respiratory: negative for cough, , sputum and wheezing  Cardiovascular: negative for chest pain, orthopnea, palpitations and syncope + leg edema  Gastrointestinal: negative for abdominal pain, constipation, diarrhea, melena, nausea and vomiting  Genitourinary:negative for dysuria, frequency and hematuria  Hematologic/lymphatic: negative for bleeding, easy bruising and lymphadenopathy  Musculoskeletal:negative for arthralgias, muscle weakness and stiff joints  Neurological: negative for coordination problems, gait problems, headaches and weakness  Endocrine: +for diabetic symptoms including polydipsia, polyuria and weight loss     Objective:   Physical Exam  Gen. Pleasant, obese, in no distress, normal affect ENT - no pallor,icterus, no post nasal drip, class 2 airway Neck: No JVD, no thyromegaly, no carotid bruits Lungs: no use of accessory muscles, no dullness to percussion,  decreased without rales or rhonchi  Cardiovascular: Rhythm regular, heart sounds  normal, no murmurs or gallops, no peripheral edema Abdomen: soft and non-tender, no hepatosplenomegaly, BS normal. Musculoskeletal: No deformities, no cyanosis or clubbing Neuro:  alert, non focal, no tremors       Assessment & Plan:

## 2019-09-30 NOTE — Assessment & Plan Note (Signed)
She has moderate OSA  The pathophysiology of obstructive sleep apnea , it's cardiovascular consequences & modes of treatment including CPAP were discused with the patient in detail & they evidenced understanding.  Prescription for auto CPAP 5 to 15 cm, nasal mask will be sent to DME DME and insurance expectations were discussed.  Not sure that she is a mouth breather so hopefully nasal interface should suffice  Weight loss encouraged, compliance with goal of at least 4-6 hrs every night is the expectation. Advised against medications with sedative side effects Cautioned against driving when sleepy - understanding that sleepiness will vary on a day to day basis

## 2019-09-30 NOTE — Patient Instructions (Signed)
Prescription for auto CPAP 5 to 15 cm, nasal mask will be sent to DME Expectation is that to use this up to 6 hours every night

## 2019-10-01 ENCOUNTER — Telehealth: Payer: Self-pay | Admitting: Family Medicine

## 2019-10-01 DIAGNOSIS — M7541 Impingement syndrome of right shoulder: Secondary | ICD-10-CM

## 2019-10-01 MED ORDER — HYDROCODONE-ACETAMINOPHEN 5-325 MG PO TABS: 1.0000 | ORAL_TABLET | Freq: Two times a day (BID) | ORAL | 0 refills | Status: DC | PRN

## 2019-10-01 NOTE — Telephone Encounter (Signed)
Pt called and only has two left of her HYDROcodone-acetaminophen (NORCO) 5-325 MG tablet. Pt does not go back to [facility] July 13th [facility]. Pt went to physical therapy and they said that her muscles are really tight and did evaluation and recommend cream and a couple of exercises.  Pt call back [phone]

## 2019-10-01 NOTE — Telephone Encounter (Signed)
Patient notified

## 2019-10-01 NOTE — Telephone Encounter (Signed)
Finally got to speak with patient after playing phone tag a little bit and relayed Dr. Brooke Bonito message. Stated that she went to OT yesterday for evaluation

## 2019-10-04 ENCOUNTER — Other Ambulatory Visit: Payer: Self-pay

## 2019-10-04 ENCOUNTER — Ambulatory Visit (INDEPENDENT_AMBULATORY_CARE_PROVIDER_SITE_OTHER): Payer: 59 | Admitting: "Endocrinology

## 2019-10-04 ENCOUNTER — Encounter: Payer: Self-pay | Admitting: "Endocrinology

## 2019-10-04 VITALS — BP 111/77 | HR 76 | Ht 67.5 in | Wt 206.6 lb

## 2019-10-04 DIAGNOSIS — E782 Mixed hyperlipidemia: Secondary | ICD-10-CM

## 2019-10-04 DIAGNOSIS — E1165 Type 2 diabetes mellitus with hyperglycemia: Secondary | ICD-10-CM | POA: Diagnosis not present

## 2019-10-04 DIAGNOSIS — I10 Essential (primary) hypertension: Secondary | ICD-10-CM | POA: Diagnosis not present

## 2019-10-04 LAB — POCT GLYCOSYLATED HEMOGLOBIN (HGB A1C): Hemoglobin A1C: 7.7 % — AB (ref 4.0–5.6)

## 2019-10-04 MED ORDER — INSULIN ASPART PROT & ASPART (70-30 MIX) 100 UNIT/ML PEN: 70.0000 [IU] | PEN_INJECTOR | Freq: Two times a day (BID) | SUBCUTANEOUS | 2 refills | Status: DC

## 2019-10-04 NOTE — Progress Notes (Signed)
10/04/2019, 11:12 AM  Endocrinology follow-up note                              Subjective:    [name], female    DOB: March 23, 1971.  Tara [place] is being seen in follow-up in  management of currently uncontrolled symptomatic diabetes requested by  Erven Colla, DO.   Past Medical History:  Diagnosis Date  . Arrhythmia   . CAD (coronary artery disease)    a. 03/2018: cath showing 60% mid-LAD stenosis not significant by FFR.   Marland Kitchen Chronic combined systolic (congestive) and diastolic (congestive) heart failure ([place])    a. 03/2018: found to have a newly reduced EF of 20% by echocardiogram  . Depression   . Diabetes mellitus   . Hypertension   . Microproteinuria    Past Surgical History:  Procedure Laterality Date  . BACK SURGERY    . BREAST BIOPSY Left 2017   benign  . ICD IMPLANT N/A 10/29/2018   Procedure: ICD IMPLANT;  Surgeon: Evans Lance, MD;  Location: [facility];  Service: Cardiovascular;  Laterality: N/A;  . INTRAVASCULAR PRESSURE WIRE/FFR STUDY N/A 03/24/2018   Procedure: INTRAVASCULAR PRESSURE WIRE/FFR STUDY;  Surgeon: Leonie Man, MD;  Location: [facility];  Service: Cardiovascular;  Laterality: N/A;  . NOSE SURGERY    . RIGHT/LEFT HEART CATH AND CORONARY ANGIOGRAPHY N/A 03/24/2018   Procedure: RIGHT/LEFT HEART CATH AND CORONARY ANGIOGRAPHY;  Surgeon: Leonie Man, MD;  Location: [facility];  Service: Cardiovascular;  Laterality: N/A;  . TONSILLECTOMY    . WISDOM TOOTH EXTRACTION     Social History   Socioeconomic History  . Marital status: Married    Spouse name: Not on file  . Number of children: Not on file  . Years of education: Not on file  . Highest education level: Not on file  Occupational History  . Not on file  Tobacco Use  . Smoking status: Never Smoker  . Smokeless tobacco: Never Used  Vaping Use  . Vaping  Use: Never used  Substance and Sexual Activity  . Alcohol use: No  . Drug use: No  . Sexual activity: Yes    Birth control/protection: Pill  Other Topics Concern  . Not on file  Social History Narrative  . Not on file   Social Determinants of Health   Financial Resource Strain:   . Difficulty of Paying Living Expenses:   Food Insecurity:   . Worried About Charity fundraiser in the Last Year:   . Arboriculturist in the Last Year:   Transportation Needs:   . Film/video editor (Medical):   Marland Kitchen Lack of Transportation (Non-Medical):   Physical Activity:   . Days of Exercise per Week:   . Minutes of Exercise per Session:   Stress:   . Feeling of Stress :   Social Connections:   . Frequency of Communication with Friends and Family:   . Frequency of Social Gatherings with Friends and Family:   . Attends Religious Services:   .  Active Member of Clubs or Organizations:   . Attends Archivist Meetings:   Marland Kitchen Marital Status:    Outpatient Encounter Medications as of 10/04/2019  Medication Sig  . apixaban (ELIQUIS) 5 MG TABS tablet Take 1 tablet (5 mg total) by mouth 2 (two) times daily.  Marland Kitchen atorvastatin (LIPITOR) 10 MG tablet Take 1 tablet (10 mg total) by mouth daily.  . B-D ULTRAFINE III SHORT PEN 31G X 8 MM MISC USE FOR INSULIN INJECTIONS TWICE DAILY  . Blood Glucose Monitoring Suppl (ACCU-CHEK GUIDE) w/Device KIT 1 Piece by Does not apply route as directed.  . furosemide (LASIX) 40 MG tablet Take  1/2 tablets daily (60 mg)   May take additional 20 mg daily for Shortness of breath  . glipiZIDE (GLUCOTROL) 5 MG tablet Take 1 tablet (5 mg total) by mouth 2 (two) times daily before a meal.  . glucose blood test strip 1 each by Other route in the morning, at noon, in the evening, and at bedtime. Use as instructed to check blood sugar four times daily  . HYDROcodone-acetaminophen (NORCO) 5-325 MG tablet Take 1 tablet by mouth 2 (two) times daily as needed for moderate pain.  Marland Kitchen  insulin aspart protamine - aspart (NOVOLOG 70/30 MIX) (70-30) 100 UNIT/ML FlexPen Inject 0.7-0.8 mLs (70-80 Units total) into the skin 2 (two) times daily with a meal. When glucose is above 90 and you are eating.  . Lancets (ONETOUCH DELICA PLUS [id number]) MISC USE TO TEST BLOOD SUGAR FOUR TIMES DAILY  . metFORMIN (GLUCOPHAGE) 500 MG tablet TAKE 1 TABLET(500 MG) BY MOUTH TWICE DAILY WITH A MEAL  . metoprolol succinate (TOPROL-XL) 100 MG 24 hr tablet Take 1&1/2 tablets (150 mg) by mouth daily. Take with or immediately following a meal.  . NORLYDA 0.35 MG tablet Take 1 tablet by mouth daily.   . sacubitril-valsartan (ENTRESTO) 97-103 MG Take 1 tablet by mouth 2 (two) times daily.  Marland Kitchen spironolactone (ALDACTONE) 25 MG tablet Take 0.5 tablets (12.5 mg total) by mouth daily.  Marland Kitchen tiZANidine (ZANAFLEX) 2 MG tablet Take 1 tablet (2 mg total) by mouth every 8 (eight) hours as needed for muscle spasms (caution causes drowsiness).  . [DISCONTINUED] insulin aspart protamine - aspart (NOVOLOG 70/30 MIX) (70-30) 100 UNIT/ML FlexPen Inject 0.8 mLs (80 Units total) into the skin 2 (two) times daily.   No facility-administered encounter medications on file as of 10/04/2019.    ALLERGIES: Allergies  Allergen Reactions  . Aspartame And Phenylalanine Dermatitis    VACCINATION STATUS: Immunization History  Administered Date(s) Administered  . Influenza Split 02/02/2013  . Influenza,inj,Quad PF,6+ Mos 01/11/2014, 12/15/2014, 12/15/2015, 01/03/2017, 01/28/2018, 02/03/2019  . Influenza-Unspecified 03/08/2011  . Pneumococcal Polysaccharide-23 03/30/2009  . Td 02/04/2006  . Tdap 02/26/2018    Diabetes She presents for her follow-up diabetic visit. She has type 2 diabetes mellitus. Onset time: She was diagnosed at approximate age of 49 years. Her disease course has been fluctuating. There are no hypoglycemic associated symptoms. Pertinent negatives for hypoglycemia include no confusion, headaches, pallor or  seizures. Associated symptoms include fatigue, polydipsia and polyuria. Pertinent negatives for diabetes include no chest pain and no polyphagia. There are no hypoglycemic complications. Symptoms are stable. Diabetic complications include heart disease. Risk factors for coronary artery disease include diabetes mellitus, dyslipidemia, hypertension, family history and sedentary lifestyle. Current diabetic treatments: She is currently on Lantus 70 units nightly, glipizide 5 mg once a day, metformin 500 mg p.o. twice daily. Her weight is increasing steadily. She  is following a generally unhealthy diet. When asked about meal planning, she reported none. She has had a previous visit with a dietitian. She never participates in exercise. Her home blood glucose trend is decreasing steadily. Her breakfast blood glucose range is generally >200 mg/dl. Her lunch blood glucose range is generally 140-180 mg/dl. Her dinner blood glucose range is generally 180-200 mg/dl. Her bedtime blood glucose range is generally 180-200 mg/dl. Her overall blood glucose range is 180-200 mg/dl. (She presents with average blood glucose of 188 over the last 14 days, point-of-care A1c unchanged at 7.7%.  She does have hypoglycemic ranges 2 to 3 days a week at lunch.    ) An ACE inhibitor/angiotensin II receptor blocker is not being taken. Eye exam is current.  Hyperlipidemia This is a chronic problem. The current episode started more than 1 year ago. Exacerbating diseases include diabetes. Pertinent negatives include no chest pain, myalgias or shortness of breath. Current antihyperlipidemic treatment includes statins. Risk factors for coronary artery disease include dyslipidemia, diabetes mellitus, hypertension, a sedentary lifestyle and family history.  Hypertension This is a chronic problem. The current episode started more than 1 year ago. Pertinent negatives include no chest pain, headaches, palpitations or shortness of breath. Risk factors  for coronary artery disease include diabetes mellitus, dyslipidemia, family history and sedentary lifestyle. Past treatments include beta blockers. Hypertensive end-organ damage includes CAD/MI and heart failure.     Review of systems  Constitutional: + Minimally fluctuating body weight,  current  Body mass index is 31.88 kg/m. , no fatigue, no subjective hyperthermia, no subjective hypothermia Eyes: no blurry vision, no xerophthalmia ENT: no sore throat, no nodules palpated in throat, no dysphagia/odynophagia, no hoarseness Cardiovascular: no Chest Pain, no Shortness of Breath, no palpitations, no leg swelling Respiratory: no cough, no shortness of breath Gastrointestinal: no Nausea/Vomiting/Diarhhea Musculoskeletal: no muscle/joint aches Skin: no rashes, no hyperemia Neurological: no tremors, no numbness, no tingling, no dizziness Psychiatric: no depression, no anxiety    Objective:    BP 111/77   Pulse 76   Ht 5' 7.5" (1.715 m)   Wt 206 lb 9.6 oz (93.7 kg)   BMI 31.88 kg/m   Wt Readings from Last 3 Encounters:  10/04/19 206 lb 9.6 oz (93.7 kg)  09/30/19 204 lb 6.4 oz (92.7 kg)  09/28/19 205 lb (93 kg)     Physical Exam- Limited  Constitutional:  Body mass index is 31.88 kg/m. , not in acute distress, normal state of mind Eyes:  EOMI, no exophthalmos Neck: Supple Thyroid: No gross goiter Respiratory: Adequate breathing efforts Musculoskeletal: no gross deformities, strength intact in all four extremities, no gross restriction of joint movements Skin:  no rashes, no hyperemia Neurological: no tremor with outstretched hands,    CMP ( most recent) CMP     Component Value Date/Time   NA 140 03/15/2019 1417   NA 138 04/03/2018 1240   K 4.0 03/15/2019 1417   CL 104 03/15/2019 1417   CO2 27 03/15/2019 1417   GLUCOSE 96 03/15/2019 1417   BUN 14 03/15/2019 1417   BUN 20 04/03/2018 1240   CREATININE 0.81 03/15/2019 1417   CREATININE 0.74 04/06/2014 0934   CALCIUM  9.3 03/15/2019 1417   PROT 7.4 10/20/2018 1111   PROT 6.5 03/26/2017 0916   ALBUMIN 3.9 10/20/2018 1111   ALBUMIN 4.0 03/26/2017 0916   AST 17 10/20/2018 1111   ALT 21 10/20/2018 1111   ALKPHOS 81 10/20/2018 1111   BILITOT 0.5 10/20/2018 1111  BILITOT 0.4 03/26/2017 0916   GFRNONAA >60 03/15/2019 1417   GFRAA >60 03/15/2019 1417     Diabetic Labs (most recent): Lab Results  Component Value Date   HGBA1C 7.7 (A) 10/04/2019   HGBA1C 7.7 (A) 06/02/2019   HGBA1C 9.5 (A) 02/12/2019     Lipid Panel ( most recent) Lipid Panel     Component Value Date/Time   CHOL 158 09/29/2018 1206   CHOL 202 (H) 03/26/2017 0916   TRIG 109 09/29/2018 1206   HDL 36 (L) 09/29/2018 1206   HDL 45 03/26/2017 0916   CHOLHDL 4.4 09/29/2018 1206   VLDL 22 09/29/2018 1206   LDLCALC 100 (H) 09/29/2018 1206   LDLCALC 134 (H) 03/26/2017 0916      Assessment & Plan:   1. Uncontrolled type 2 diabetes mellitus with hyperglycemia ([place])  - Tara Bryant has currently uncontrolled symptomatic type 2 DM since  49 years of age.  She was recently initiated on premixed insulin NovoLog 70/30 twice daily. She presents with generally improved average blood glucose of 188 over the last 14 days, point-of-care A1c unchanged at 7.7%.  She does have hypoglycemic ranges 2 to 3 days a week at lunch.    her diabetes is complicated by coronary artery disease/CHF and she remains at a high risk for more acute and chronic complications which include CAD, CVA, CKD, retinopathy, and neuropathy. These are all discussed in detail with her.  - I have counseled her on diet management and weight loss, by adopting a carbohydrate restricted/protein rich diet.  - she  admits there is a room for improvement in her diet and drink choices. -  Suggestion is made for her to avoid simple carbohydrates  from her diet including Cakes, Sweet Desserts / Pastries, Ice Cream, Soda (diet and regular), Sweet Tea, Candies, Chips, Cookies, Sweet  Pastries,  Store Bought Juices, Alcohol in Excess of  1-2 drinks a day, Artificial Sweeteners, Coffee Creamer, and "Sugar-free" Products. This will help patient to have stable blood glucose profile and potentially avoid unintended weight gain.  - I encouraged her to switch to  unprocessed or minimally processed complex starch and increased protein intake (animal or plant source), fruits, and vegetables.  - she is advised to stick to a routine mealtimes to eat 3 meals  a day and avoid unnecessary snacks ( to snack only to correct hypoglycemia).   - she has been scheduled with Jearld Fenton, RDN, CDE for individualized diabetes education.  - I have approached her with the following individualized plan to manage diabetes and patient agrees:   -Her insurance continue to pay for NovoLog 70/30.  To avoid lunchtime hypoglycemia, she is advised to lower NovoLog 70/30 to 70 units with breakfast, 80 units at supper for premeal blood glucose readings above 90 mg per DL.    She is advised to continue to monitor blood glucose 4 times a day-before meals and at bedtime.   -She has a tendency to get overwhelmed for multiple daily injections of insulin, will continue to benefit from simplified treatment regimen.    - she is encouraged to call clinic for blood glucose levels less than 70 or above 300 mg /dl. - she is advised to continue metformin 500 mg p.o. twice daily, therapeutically suitable for patient . -She is advised to lower glipizide to 5 mg p.o. twice daily -with breakfast and with supper.   -She will be considered for some incretin therapy on subsequent visits. - Patient specific target  A1c;  LDL, HDL, Triglycerides,  were discussed in detail.  2) Blood Pressure /Hypertension: -Her blood pressure is controlled to target.  she is advised to continue her current medications including lisinopril 2.5 mg p.o. daily with breakfast .  3) Lipids/Hyperlipidemia:   Review of her recent lipid panel showed  improved LDL to 100 from 137.    She is currently on atorvastatin 10 mg p.o. daily at bedtime.   She is advised to continue, side effects discussed with her.   4)  Weight/Diet: She has a BMI of 31.8-she is a candidate for weight loss.  I discussed with her the fact that loss of 5 - 10% of her  current body weight will have the most impact on her diabetes management.  CDE Consult will be initiated . Exercise, and detailed carbohydrates information provided  -  detailed on discharge instructions.  5) Chronic Care/Health Maintenance:  -she  is on ACEI medications and  is encouraged to initiate and continue to follow up with Ophthalmology, Dentist,  Podiatrist at least yearly or according to recommendations, and advised to  stay away from smoking. I have recommended yearly flu vaccine and pneumonia vaccine at least every 5 years; moderate intensity exercise for up to 150 minutes weekly; and  sleep for at least 7 hours a day.  - she is  advised to maintain close follow up with Erven Colla, DO for primary care needs, as well as her other providers for optimal and coordinated care.   - Time spent on this patient care encounter:  35 min, of which > 50% was spent in  counseling and the rest reviewing her blood glucose logs , discussing her hypoglycemia and hyperglycemia episodes, reviewing her current and  previous labs / studies  ( including abstraction from other facilities) and medications  doses and developing a  long term treatment plan and documenting her care.   Please refer to Patient Instructions for Blood Glucose Monitoring and Insulin/Medications Dosing Guide"  in media tab for additional information. Please  also refer to " Patient Self Inventory" in the Media  tab for reviewed elements of pertinent patient history.  [name] participated in the discussions, expressed understanding, and voiced agreement with the above plans.  All questions were answered to her satisfaction. she is  encouraged to contact clinic should she have any questions or concerns prior to her return visit.  Follow up plan: - Return in about 3 months (around 01/04/2020) for F/U with Pre-visit Labs, Meter, Logs, A1c here.Glade Lloyd, MD [facility] [address] [place], [place] [address] Phone: [phone]  Fax: [phone]    10/04/2019, 11:12 AM  This note was partially dictated with voice recognition software. Similar sounding words can be transcribed inadequately or may not  be corrected upon review.

## 2019-10-04 NOTE — Patient Instructions (Signed)

## 2019-10-05 ENCOUNTER — Ambulatory Visit (HOSPITAL_COMMUNITY): Payer: 59 | Admitting: Specialist

## 2019-10-05 ENCOUNTER — Encounter (HOSPITAL_COMMUNITY): Payer: Self-pay | Admitting: Specialist

## 2019-10-05 DIAGNOSIS — M25511 Pain in right shoulder: Secondary | ICD-10-CM

## 2019-10-05 DIAGNOSIS — R29898 Other symptoms and signs involving the musculoskeletal system: Secondary | ICD-10-CM

## 2019-10-05 NOTE — Therapy (Signed)
[place], [place], [address] Phone: [phone]   Fax:  [phone]  Occupational Therapy Treatment  Patient Details  Name: Tara Bryant MRN: [id number] Date of Birth: 1970-10-02 Referring Provider (OT): Dr. Sanjuana Kava   Encounter Date: 10/05/2019   OT End of Session - 10/05/19 1549    Visit Number 2    Number of Visits 6    Date for OT Re-Evaluation 10/22/19    Authorization Type [employer]    Authorization Time Period 30 visit limit OT/PT combined    Authorization - Visit Number 2    Authorization - Number of Visits 30    OT Start Time 1115    OT Stop Time 1155    OT Time Calculation (min) 40 min    Activity Tolerance Patient tolerated treatment well    Behavior During Therapy [facility] for tasks assessed/performed           Past Medical History:  Diagnosis Date  . Arrhythmia   . CAD (coronary artery disease)    a. 03/2018: cath showing 60% mid-LAD stenosis not significant by FFR.   Marland Kitchen Chronic combined systolic (congestive) and diastolic (congestive) heart failure ([place])    a. 03/2018: found to have a newly reduced EF of 20% by echocardiogram  . Depression   . Diabetes mellitus   . Hypertension   . Microproteinuria     Past Surgical History:  Procedure Laterality Date  . BACK SURGERY    . BREAST BIOPSY Left 2017   benign  . ICD IMPLANT N/A 10/29/2018   Procedure: ICD IMPLANT;  Surgeon: Evans Lance, MD;  Location: [facility];  Service: Cardiovascular;  Laterality: N/A;  . INTRAVASCULAR PRESSURE WIRE/FFR STUDY N/A 03/24/2018   Procedure: INTRAVASCULAR PRESSURE WIRE/FFR STUDY;  Surgeon: Leonie Man, MD;  Location: [facility] [facility];  Service: Cardiovascular;  Laterality: N/A;  . NOSE SURGERY    . RIGHT/LEFT HEART CATH AND CORONARY ANGIOGRAPHY N/A 03/24/2018   Procedure: RIGHT/LEFT HEART CATH AND CORONARY ANGIOGRAPHY;  Surgeon: Leonie Man, MD;  Location: [facility];  Service: Cardiovascular;  Laterality: N/A;  . TONSILLECTOMY    . WISDOM TOOTH EXTRACTION      There were no vitals filed for this visit.   Subjective Assessment - 10/05/19 1548    Subjective  S:  I dont get any relief from heat or ice, the temperatures hurt my arm.  It hurts from my neck all the way to my wrist.    Currently in Pain? Yes    Pain Score 7     Pain Location Shoulder    Pain Orientation Right              [facility] OT Assessment - 10/05/19 0001      Assessment   Medical Diagnosis right shoulder pain    Referring Provider (OT) Dr. Sanjuana Kava      Precautions   Precaution Comments defib. placement       Restrictions   Weight Bearing Restrictions No                    OT Treatments/Exercises (OP) - 10/05/19 0001      Exercises   Exercises Shoulder;Neck      Neck Exercises: Seated   Neck Retraction 10 reps    Cervical Rotation 10 reps    Lateral Flexion 10 reps    Shoulder Shrugs 10 reps  Shoulder Rolls 10 reps    Shoulder Flexion 10 reps    Shoulder ABduction 10 reps      Shoulder Exercises: Supine   Protraction PROM;5 reps    Horizontal ABduction PROM;5 reps    External Rotation PROM;5 reps    Internal Rotation PROM;5 reps    Flexion PROM;5 reps    ABduction PROM;5 reps      Shoulder Exercises: Seated   Elevation AROM;10 reps    Extension AROM;10 reps    Row AROM;10 reps      Shoulder Exercises: Therapy Ball   Flexion 15 reps    Flexion Limitations mod tactile cues to depress shoulder blade    ABduction 15 reps    ABduction Limitations mod tactile cues to depress shoulder blade       Manual Therapy   Manual Therapy Myofascial release    Manual therapy comments manual therapy completed seperately from all other interventions this date    Myofascial Release myofascial release and manual stretching to decrease pain and restrictions and improve pain free moblility in right upper extremity, shoulder, and associated area                     OT Education - 10/05/19 1548    Education Details reviewed HEP    Person(s) Educated Patient    Methods Explanation            OT Short Term Goals - 10/05/19 1551      OT SHORT TERM GOAL #1   Title Pt will be provided with and educated on HEP to improve mobility of RUE and cervical neck regions required for ADL completion.    Time 3    Period Weeks    Status On-going    Target Date 10/22/19      OT SHORT TERM GOAL #2   Title Pt will increase RUE A/ROM to [facility] to improve ability to reach overhead and behind back during dressing tasks.    Time 3    Period Weeks    Status On-going      OT SHORT TERM GOAL #3   Title Pt will increase cervical A/ROM to [facility] to improve ability to turn head to look over shoulder when driving.    Time 3    Period Weeks    Status On-going      OT SHORT TERM GOAL #4   Title Pt will decrease RUE fascial restrictions to minimal amounts to improve mobility required for functional reaching tasks.    Time 3    Period Weeks    Status On-going      OT SHORT TERM GOAL #5   Title Pt will increase RUE strength to 4+/5 or greater to improve ability to perform light lifting tasks during ADLs.    Time 3    Period Weeks    Status On-going      OT SHORT TERM GOAL #6   Title Pt will decrease RUE and cervical neck pain to 4/10 or less to improve ability to sleep in the bed versus the recliner.    Time 3    Period Weeks    Status On-going                    Plan - 10/05/19 1549    Clinical Impression Statement A:  Completed manual therapy to right upper extremity, shoulder, and cervical region to decrease pain.  patient states pain is no different from beginning of  session to end of session.  declined use of heat or ice modalities as these have irritated her arm in the past.    Body Structure / Function / Physical Skills ADL;Endurance;UE functional use;Fascial restriction;Pain;ROM;IADL;Strength;Mobility    Plan P:  Continue  manual therapy  to RUE and associated regions, attempt aa/rom in supine.           Patient will benefit from skilled therapeutic intervention in order to improve the following deficits and impairments:   Body Structure / Function / Physical Skills: ADL, Endurance, UE functional use, Fascial restriction, Pain, ROM, IADL, Strength, Mobility       Visit Diagnosis: Acute pain of right shoulder  Other symptoms and signs involving the musculoskeletal system    Problem List Patient Active Problem List   Diagnosis Date Noted  . OSA (obstructive sleep apnea) 09/30/2019  . Chronic systolic heart failure ([place]) 02/04/2019  . ICD (implantable cardioverter-defibrillator) in place 02/04/2019  . Uncontrolled type 2 diabetes mellitus with hyperglycemia ([place]) 06/10/2018  . Mixed hyperlipidemia 06/10/2018  . Family history of left ventricular noncompaction   . Dilated cardiomyopathy ([place]): EF 20%, global hypokinesis. 03/23/2018  . Acute combined systolic and diastolic heart failure ([place]) 03/23/2018  . Hirsutism 11/04/2016  . Perimenopause 07/28/2015  . Irritable bowel syndrome 09/01/2014  . Gastritis 05/11/2014  . Hypertriglyceridemia, essential 03/08/2014  . Type 2 diabetes mellitus without complication, with long-term current use of insulin ([place]) 06/29/2013  . Pain in joint, lower leg 12/01/2012  . Venous stasis of lower extremity 12/01/2012  . Depression 12/01/2012  . Essential hypertension, benign 07/30/2012    Vangie Bicker, [place], OTR/L [phone]  10/05/2019, 3:56 PM  [place], [place], [address] Phone: [phone]   Fax:  [phone]  Name: BOWIE DOIRON MRN: [id number] Date of Birth: [birth year]

## 2019-10-14 ENCOUNTER — Other Ambulatory Visit: Payer: Self-pay

## 2019-10-14 ENCOUNTER — Encounter (HOSPITAL_COMMUNITY): Payer: Self-pay | Admitting: Specialist

## 2019-10-14 ENCOUNTER — Ambulatory Visit (HOSPITAL_COMMUNITY): Payer: 59 | Attending: Orthopaedic Surgery | Admitting: Specialist

## 2019-10-14 DIAGNOSIS — R29898 Other symptoms and signs involving the musculoskeletal system: Secondary | ICD-10-CM | POA: Diagnosis present

## 2019-10-14 DIAGNOSIS — M25511 Pain in right shoulder: Secondary | ICD-10-CM | POA: Insufficient documentation

## 2019-10-14 NOTE — Therapy (Signed)
[place], [place], [address] Phone: [phone]   Fax:  [phone]  Occupational Therapy Treatment  Patient Details  Name: Tara Bryant MRN: [id number] Date of Birth: [birth year] Referring Provider (OT): Dr. Sanjuana Kava   Encounter Date: 10/14/2019   OT End of Session - 10/14/19 1505    Visit Number 3    Number of Visits 6    Date for OT Re-Evaluation 10/22/19    Authorization Type [employer]    Authorization Time Period 30 visit limit OT/PT combined    Authorization - Visit Number 3    Authorization - Number of Visits 30    OT Start Time 2671    OT Stop Time 1155    OT Time Calculation (min) 41 min    Activity Tolerance Patient tolerated treatment well    Behavior During Therapy [facility] for tasks assessed/performed           Past Medical History:  Diagnosis Date  . Arrhythmia   . CAD (coronary artery disease)    a. 03/2018: cath showing 60% mid-LAD stenosis not significant by FFR.   Marland Kitchen Chronic combined systolic (congestive) and diastolic (congestive) heart failure ([place])    a. 03/2018: found to have a newly reduced EF of 20% by echocardiogram  . Depression   . Diabetes mellitus   . Hypertension   . Microproteinuria     Past Surgical History:  Procedure Laterality Date  . BACK SURGERY    . BREAST BIOPSY Left 2017   benign  . ICD IMPLANT N/A 10/29/2018   Procedure: ICD IMPLANT;  Surgeon: Evans Lance, MD;  Location: [place] [facility];  Service: Cardiovascular;  Laterality: N/A;  . INTRAVASCULAR PRESSURE WIRE/FFR STUDY N/A 03/24/2018   Procedure: INTRAVASCULAR PRESSURE WIRE/FFR STUDY;  Surgeon: Leonie Man, MD;  Location: [place] [facility];  Service: Cardiovascular;  Laterality: N/A;  . NOSE SURGERY    . RIGHT/LEFT HEART CATH AND CORONARY ANGIOGRAPHY N/A 03/24/2018   Procedure: RIGHT/LEFT HEART CATH AND CORONARY ANGIOGRAPHY;  Surgeon: Leonie Man, MD;  Location: [place] [facility];   Service: Cardiovascular;  Laterality: N/A;  . TONSILLECTOMY    . WISDOM TOOTH EXTRACTION      There were no vitals filed for this visit.   Subjective Assessment - 10/14/19 1504    Subjective  S:  It still feels the same, nothing really helps it.    Currently in Pain? Yes    Pain Score 7               [facility] OT Assessment - 10/14/19 0001      Assessment   Medical Diagnosis right shoulder pain    Referring Provider (OT) Dr. Sanjuana Kava                    OT Treatments/Exercises (OP) - 10/14/19 0001      Exercises   Exercises Shoulder;Neck      Shoulder Exercises: Supine   Protraction PROM;5 reps;AAROM;10 reps    Horizontal ABduction PROM;5 reps;AAROM;10 reps    External Rotation PROM;5 reps;AAROM;10 reps    Internal Rotation PROM;5 reps;AAROM;10 reps    Flexion PROM;5 reps;AAROM;10 reps    ABduction PROM;5 reps;AAROM;10 reps      Shoulder Exercises: Seated   Elevation AROM;15 reps    Extension AROM;15 reps;Theraband;10 reps    Theraband Level (Shoulder Extension) Level 2 (Red)    Retraction AROM;15 reps;Theraband;10 reps  Theraband Level (Shoulder Retraction) Level 2 (Red)    Row AROM;15 reps;Theraband;10 reps    Theraband Level (Shoulder Row) Level 2 (Red)      Shoulder Exercises: Therapy Ball   Flexion 15 reps    ABduction 15 reps    Right/Left 5 reps      Manual Therapy   Manual Therapy Myofascial release;Manual Traction    Manual therapy comments manual therapy completed seperately from all other interventions this date    Myofascial Release myofascial release and manual stretching to decrease pain and restrictions and improve pain free moblility in right upper extremity, shoulder, and associated area      Manual Traction manual traction to cervical region  and right upper extremity.                  OT Education - 10/14/19 1505    Education Details recommended use of tennis ball for self massage of scapular region    Person(s)  Educated Patient    Methods Explanation    Comprehension Verbalized understanding;Returned demonstration            OT Short Term Goals - 10/05/19 1551      OT SHORT TERM GOAL #1   Title Pt will be provided with and educated on HEP to improve mobility of RUE and cervical neck regions required for ADL completion.    Time 3    Period Weeks    Status On-going    Target Date 10/22/19      OT SHORT TERM GOAL #2   Title Pt will increase RUE A/ROM to [facility] to improve ability to reach overhead and behind back during dressing tasks.    Time 3    Period Weeks    Status On-going      OT SHORT TERM GOAL #3   Title Pt will increase cervical A/ROM to [facility] to improve ability to turn head to look over shoulder when driving.    Time 3    Period Weeks    Status On-going      OT SHORT TERM GOAL #4   Title Pt will decrease RUE fascial restrictions to minimal amounts to improve mobility required for functional reaching tasks.    Time 3    Period Weeks    Status On-going      OT SHORT TERM GOAL #5   Title Pt will increase RUE strength to 4+/5 or greater to improve ability to perform light lifting tasks during ADLs.    Time 3    Period Weeks    Status On-going      OT SHORT TERM GOAL #6   Title Pt will decrease RUE and cervical neck pain to 4/10 or less to improve ability to sleep in the bed versus the recliner.    Time 3    Period Weeks    Status On-going                    Plan - 10/14/19 1507    Clinical Impression Statement A:  Session focused on manual techniques to attempt to decrease pain levels, as well as therapeutic exercises to improve postural alignment of cervical and shoulder region. Patient able to participate in all therapeutic exercises, stating they were painful however she was able to complete them.    Body Structure / Function / Physical Skills ADL;Endurance;UE functional use;Fascial restriction;Pain;ROM;IADL;Strength;Mobility    Plan P:  Reassess for MD  appointment, continue to focus on manual techniques and  postural exercises.           Patient will benefit from skilled therapeutic intervention in order to improve the following deficits and impairments:   Body Structure / Function / Physical Skills: ADL, Endurance, UE functional use, Fascial restriction, Pain, ROM, IADL, Strength, Mobility       Visit Diagnosis: Acute pain of right shoulder  Other symptoms and signs involving the musculoskeletal system    Problem List Patient Active Problem List   Diagnosis Date Noted  . OSA (obstructive sleep apnea) 09/30/2019  . Chronic systolic heart failure ([place]) 02/04/2019  . ICD (implantable cardioverter-defibrillator) in place 02/04/2019  . Uncontrolled type 2 diabetes mellitus with hyperglycemia ([place]) 06/10/2018  . Mixed hyperlipidemia 06/10/2018  . Family history of left ventricular noncompaction   . Dilated cardiomyopathy ([place]): EF 20%, global hypokinesis. 03/23/2018  . Acute combined systolic and diastolic heart failure ([place]) 03/23/2018  . Hirsutism 11/04/2016  . Perimenopause 07/28/2015  . Irritable bowel syndrome 09/01/2014  . Gastritis 05/11/2014  . Hypertriglyceridemia, essential 03/08/2014  . Type 2 diabetes mellitus without complication, with long-term current use of insulin ([place]) 06/29/2013  . Pain in joint, lower leg 12/01/2012  . Venous stasis of lower extremity 12/01/2012  . Depression 12/01/2012  . Essential hypertension, benign 07/30/2012    Vangie Bicker, [place], OTR/L [phone]  10/14/2019, 3:13 PM  [facility] [address] [place], [place], [address] Phone: [phone]   Fax:  [phone]  Name: Tara Bryant MRN: [id number] Date of Birth: 1970/09/10

## 2019-10-15 ENCOUNTER — Telehealth: Payer: Self-pay | Admitting: Family Medicine

## 2019-10-15 ENCOUNTER — Other Ambulatory Visit: Payer: Self-pay | Admitting: Family Medicine

## 2019-10-15 ENCOUNTER — Ambulatory Visit (HOSPITAL_COMMUNITY): Payer: 59 | Admitting: Occupational Therapy

## 2019-10-15 ENCOUNTER — Other Ambulatory Visit: Payer: Self-pay | Admitting: Obstetrics and Gynecology

## 2019-10-15 ENCOUNTER — Encounter (HOSPITAL_COMMUNITY): Payer: Self-pay | Admitting: Occupational Therapy

## 2019-10-15 DIAGNOSIS — M25511 Pain in right shoulder: Secondary | ICD-10-CM | POA: Diagnosis not present

## 2019-10-15 DIAGNOSIS — R29898 Other symptoms and signs involving the musculoskeletal system: Secondary | ICD-10-CM

## 2019-10-15 DIAGNOSIS — Z1231 Encounter for screening mammogram for malignant neoplasm of breast: Secondary | ICD-10-CM

## 2019-10-15 NOTE — Telephone Encounter (Signed)
Pt is having pain in both breast. Pt was but on Hydrocodone  She is going through therapy  Possible pinch nerve she called [place] [facility] to make appt for mammogram  And they said she need some type of form for diagnostic mammogram ultra sound. Does she need to make an appt or can this be sent. Fax [phone]  Pt call back [phone] home [phone] Cell

## 2019-10-15 NOTE — Telephone Encounter (Signed)
Pt will need appointment. Informed front that patient would need an appointment and they will call to have her set up.

## 2019-10-15 NOTE — Therapy (Addendum)
[place], [place], [address] Phone: [phone]   Fax:  [phone]  Occupational Therapy Treatment  Patient Details  Name: Tara Bryant MRN: [id number] Date of Birth: Feb 24, 1971 Referring Provider (OT): Dr. Sanjuana Kava   Encounter Date: 10/15/2019   OT End of Session - 10/15/19 1707    Visit Number 4    Number of Visits 6    Date for OT Re-Evaluation 10/22/19    Authorization Type [employer]    Authorization Time Period 30 visit limit OT/PT combined    Authorization - Visit Number 4    Authorization - Number of Visits 30    OT Start Time 1302    OT Stop Time 1343    OT Time Calculation (min) 41 min    Activity Tolerance Patient tolerated treatment well    Behavior During Therapy [facility] for tasks assessed/performed           Past Medical History:  Diagnosis Date  . Arrhythmia   . CAD (coronary artery disease)    a. 03/2018: cath showing 60% mid-LAD stenosis not significant by FFR.   Marland Kitchen Chronic combined systolic (congestive) and diastolic (congestive) heart failure ([place])    a. 03/2018: found to have a newly reduced EF of 20% by echocardiogram  . Depression   . Diabetes mellitus   . Hypertension   . Microproteinuria     Past Surgical History:  Procedure Laterality Date  . BACK SURGERY    . BREAST BIOPSY Left 2017   benign  . ICD IMPLANT N/A 10/29/2018   Procedure: ICD IMPLANT;  Surgeon: Evans Lance, MD;  Location: [place] [facility];  Service: Cardiovascular;  Laterality: N/A;  . INTRAVASCULAR PRESSURE WIRE/FFR STUDY N/A 03/24/2018   Procedure: INTRAVASCULAR PRESSURE WIRE/FFR STUDY;  Surgeon: Leonie Man, MD;  Location: [place] [facility];  Service: Cardiovascular;  Laterality: N/A;  . NOSE SURGERY    . RIGHT/LEFT HEART CATH AND CORONARY ANGIOGRAPHY N/A 03/24/2018   Procedure: RIGHT/LEFT HEART CATH AND CORONARY ANGIOGRAPHY;  Surgeon: Leonie Man, MD;  Location: [place] [facility];   Service: Cardiovascular;  Laterality: N/A;  . TONSILLECTOMY    . WISDOM TOOTH EXTRACTION      There were no vitals filed for this visit.   Subjective Assessment - 10/15/19 1305    Subjective  S: I am sorry, but I don't think I have made any progress.    Pertinent History Pt is a 49 y/o female presenting with right shoulder pain present since 09/10/19, no known cause. Pt has pain radiating from shoulder down to hand and fingers, as well as to neck region. Pt had x-ray, has not had MRI, reports suspected pinched nerve in her cervical neck given degenerative changes from C5 to C7 and anterior spurring. Pt was referred to occupational therapy for evaluation and treatment by Dr. Sanjuana Kava.    Currently in Pain? Yes    Pain Score 7     Pain Location Shoulder    Pain Orientation Right    Pain Descriptors / Indicators Aching;Sore              [facility] OT Assessment - 10/15/19 1308      Assessment   Medical Diagnosis right shoulder pain    Referring Provider (OT) Dr. Sanjuana Kava      Precautions   Precautions None      ROM / Strength   AROM / PROM / Strength AROM;PROM;Strength  AROM   Overall AROM Comments Assessed seated, er/IR adducted    AROM Assessment Site Shoulder    Right/Left Shoulder Right    Right Shoulder Flexion 106 Degrees   105   Right Shoulder ABduction 121 Degrees   120   Right Shoulder Internal Rotation 90 Degrees   90   Right Shoulder External Rotation 85 Degrees   80     PROM   Overall PROM Comments Assessed supine, er/IR adducted    Right Shoulder Flexion 133 Degrees   115   Right Shoulder ABduction 102 Degrees   86   Right Shoulder Internal Rotation 90 Degrees   90   Right Shoulder External Rotation 62 Degrees   80     Strength   Overall Strength Comments Assessed supine, er/IR adducted    Strength Assessment Site Shoulder    Right Shoulder Flexion 4+/5    Right Shoulder ABduction 4-/5    Right Shoulder Internal Rotation 4+/5    Right Shoulder  External Rotation 4-/5                    OT Treatments/Exercises (OP) - 10/15/19 1318      Exercises   Exercises Shoulder;Neck      Neck Exercises: Seated   Neck Retraction 10 reps    Cervical Rotation 10 reps    Lateral Flexion 10 reps      Shoulder Exercises: Supine   Protraction AAROM;10 reps    External Rotation PROM;12 reps;AAROM;10 reps    Internal Rotation PROM;12 reps;AAROM;10 reps    Flexion PROM;12 reps;AAROM;10 reps    ABduction PROM;12 reps;AAROM;10 reps      Shoulder Exercises: Seated   Extension AROM;10 reps;Theraband    Theraband Level (Shoulder Extension) Level 2 (Red)    Row AROM;10 reps;Theraband    Theraband Level (Shoulder Row) Level 2 (Red)    External Rotation AROM;10 reps;Theraband    Theraband Level (Shoulder External Rotation) Level 2 (Red)    Internal Rotation AROM;10 reps;Theraband    Theraband Level (Shoulder Internal Rotation) Level 2 (Red)      Manual Therapy   Manual Therapy Myofascial release    Manual therapy comments manual therapy completed seperately from all other interventions this date    Myofascial Release myofascial release and manual stretching to decrease pain and restrictions and improve pain free moblility in right upper extremity, shoulder, and associated area                      OT Short Term Goals - 10/05/19 1551      OT SHORT TERM GOAL #1   Title Pt will be provided with and educated on HEP to improve mobility of RUE and cervical neck regions required for ADL completion.    Time 3    Period Weeks    Status On-going    Target Date 10/22/19      OT SHORT TERM GOAL #2   Title Pt will increase RUE A/ROM to [facility] to improve ability to reach overhead and behind back during dressing tasks.    Time 3    Period Weeks    Status On-going      OT SHORT TERM GOAL #3   Title Pt will increase cervical A/ROM to [facility] to improve ability to turn head to look over shoulder when driving.    Time 3    Period Weeks     Status On-going      OT SHORT TERM  GOAL #4   Title Pt will decrease RUE fascial restrictions to minimal amounts to improve mobility required for functional reaching tasks.    Time 3    Period Weeks    Status On-going      OT SHORT TERM GOAL #5   Title Pt will increase RUE strength to 4+/5 or greater to improve ability to perform light lifting tasks during ADLs.    Time 3    Period Weeks    Status On-going      OT SHORT TERM GOAL #6   Title Pt will decrease RUE and cervical neck pain to 4/10 or less to improve ability to sleep in the bed versus the recliner.    Time 3    Period Weeks    Status On-going                    Plan - 10/15/19 1708    Clinical Impression Statement A:  Mini re-assessment and measurements taken. Noting no significant change in AROM since eval, and pt continues to report daily pain limiting her functional performance. Focused on myofascial release and PROM for reduction of pain level. Pt continues to report significant pain throughout RUE. Pt performing AAROM with dowel rod in supine. Pt performing theraband exercises while seated. Cues provided throughout for form and technique. Discussed follow up with MD on 10/19/19 and possibly hold therapy.    Body Structure / Function / Physical Skills ADL;Endurance;UE functional use;Fascial restriction;Pain;ROM;IADL;Strength;Mobility    Plan P: Possible hold of therapy.           Patient will benefit from skilled therapeutic intervention in order to improve the following deficits and impairments:   Body Structure / Function / Physical Skills: ADL, Endurance, UE functional use, Fascial restriction, Pain, ROM, IADL, Strength, Mobility       Visit Diagnosis: Acute pain of right shoulder  Other symptoms and signs involving the musculoskeletal system    Problem List Patient Active Problem List   Diagnosis Date Noted  . OSA (obstructive sleep apnea) 09/30/2019  . Chronic systolic heart failure ([place])  02/04/2019  . ICD (implantable cardioverter-defibrillator) in place 02/04/2019  . Uncontrolled type 2 diabetes mellitus with hyperglycemia ([place]) 06/10/2018  . Mixed hyperlipidemia 06/10/2018  . Family history of left ventricular noncompaction   . Dilated cardiomyopathy ([place]): EF 20%, global hypokinesis. 03/23/2018  . Acute combined systolic and diastolic heart failure ([place]) 03/23/2018  . Hirsutism 11/04/2016  . Perimenopause 07/28/2015  . Irritable bowel syndrome 09/01/2014  . Gastritis 05/11/2014  . Hypertriglyceridemia, essential 03/08/2014  . Type 2 diabetes mellitus without complication, with long-term current use of insulin ([place]) 06/29/2013  . Pain in joint, lower leg 12/01/2012  . Venous stasis of lower extremity 12/01/2012  . Depression 12/01/2012  . Essential hypertension, benign 07/30/2012    Neal Dy, MSOT, OTR/L 10/15/2019, 5:13 PM  [place], [place], [address] Phone: [phone]   Fax:  [phone]  Name: Tara Bryant MRN: [id number] Date of Birth: September 30, 1970

## 2019-10-19 ENCOUNTER — Ambulatory Visit (INDEPENDENT_AMBULATORY_CARE_PROVIDER_SITE_OTHER): Payer: 59 | Admitting: Orthopaedic Surgery

## 2019-10-19 ENCOUNTER — Telehealth: Payer: Self-pay

## 2019-10-19 ENCOUNTER — Telehealth: Payer: Self-pay | Admitting: Pulmonary Disease

## 2019-10-19 ENCOUNTER — Encounter: Payer: Self-pay | Admitting: Orthopaedic Surgery

## 2019-10-19 ENCOUNTER — Other Ambulatory Visit: Payer: Self-pay

## 2019-10-19 ENCOUNTER — Other Ambulatory Visit: Payer: Self-pay | Admitting: "Endocrinology

## 2019-10-19 VITALS — BP 154/97 | HR 92 | Ht 67.5 in | Wt 207.0 lb

## 2019-10-19 DIAGNOSIS — M542 Cervicalgia: Secondary | ICD-10-CM | POA: Diagnosis not present

## 2019-10-19 DIAGNOSIS — G8929 Other chronic pain: Secondary | ICD-10-CM

## 2019-10-19 DIAGNOSIS — M25511 Pain in right shoulder: Secondary | ICD-10-CM | POA: Diagnosis not present

## 2019-10-19 MED ORDER — HYDROCODONE-ACETAMINOPHEN 5-325 MG PO TABS: ORAL_TABLET | ORAL | 0 refills | Status: DC

## 2019-10-19 NOTE — Telephone Encounter (Signed)
Called [facility] and spoke with Bethanne Ginger who stated that pt called requesting to cancel order for CPAP.  Routing to Dr. Elsworth Soho as an Juluis Rainier.

## 2019-10-19 NOTE — Telephone Encounter (Signed)
Patient called wanting to know if she is able to get a MRI with her ICD and if she can get a call back within the next hour

## 2019-10-19 NOTE — Progress Notes (Signed)
Patient YW:VPXTG Tara Bryant, female DOB:January 26, 1971, 49 y.o. GYI:[id number]  Chief Complaint  Patient presents with  . Shoulder Pain    R/still hurting    HPI  Tara Bryant is a 49 y.o. female who has pain radiating from neck to right shoulder and to right hand with numbness of the long and index fingers.  She went to OT.  I have reviewed the notes.  It did not help, she feels it made her worse.  She has a defibrillator in place.  I do not know if this model can have a MRI done.  We will check. If she can have a MRI, then I will order.  If not, she will need to be seen by neurosurgery for possible myelogram.  She cannot take NASAIDs    Body mass index is 31.94 kg/m.  ROS  Review of Systems  Constitutional: Positive for activity change.  Cardiovascular: Positive for chest pain and palpitations.  Musculoskeletal: Positive for arthralgias, myalgias and neck pain.  All other systems reviewed and are negative.   All other systems reviewed and are negative.  The following is a summary of the past history medically, past history surgically, known current medicines, social history and family history.  This information is gathered electronically by the computer from prior information and documentation.  I review this each visit and have found including this information at this point in the chart is beneficial and informative.    Past Medical History:  Diagnosis Date  . Arrhythmia   . CAD (coronary artery disease)    a. 03/2018: cath showing 60% mid-LAD stenosis not significant by FFR.   Marland Kitchen Chronic combined systolic (congestive) and diastolic (congestive) heart failure ([place])    a. 03/2018: found to have a newly reduced EF of 20% by echocardiogram  . Depression   . Diabetes mellitus   . Hypertension   . Microproteinuria     Past Surgical History:  Procedure Laterality Date  . BACK SURGERY    . BREAST BIOPSY Left 2017   benign  . ICD IMPLANT N/A 10/29/2018   Procedure: ICD  IMPLANT;  Surgeon: Evans Lance, MD;  Location: [facility];  Service: Cardiovascular;  Laterality: N/A;  . INTRAVASCULAR PRESSURE WIRE/FFR STUDY N/A 03/24/2018   Procedure: INTRAVASCULAR PRESSURE WIRE/FFR STUDY;  Surgeon: Leonie Man, MD;  Location: [facility];  Service: Cardiovascular;  Laterality: N/A;  . NOSE SURGERY    . RIGHT/LEFT HEART CATH AND CORONARY ANGIOGRAPHY N/A 03/24/2018   Procedure: RIGHT/LEFT HEART CATH AND CORONARY ANGIOGRAPHY;  Surgeon: Leonie Man, MD;  Location: [facility];  Service: Cardiovascular;  Laterality: N/A;  . TONSILLECTOMY    . WISDOM TOOTH EXTRACTION      Family History  Problem Relation Age of Onset  . Stroke Sister 10       smoker, diabetic, HTN  . Breast cancer Sister   . Cancer Mother        breast  . Breast cancer Mother   . Hypertension Mother   . Diabetes Other   . Heart attack Other   . Chronic Renal Failure Father     Social History Social History   Tobacco Use  . Smoking status: Never Smoker  . Smokeless tobacco: Never Used  Vaping Use  . Vaping Use: Never used  Substance Use Topics  . Alcohol use: No  . Drug use: No    Allergies  Allergen Reactions  . Aspartame And Phenylalanine Dermatitis  Current Outpatient Medications  Medication Sig Dispense Refill  . apixaban (ELIQUIS) 5 MG TABS tablet Take 1 tablet (5 mg total) by mouth 2 (two) times daily. 180 tablet 3  . atorvastatin (LIPITOR) 10 MG tablet Take 1 tablet (10 mg total) by mouth daily. 30 tablet 5  . B-D ULTRAFINE III SHORT PEN 31G X 8 MM MISC USE FOR INSULIN INJECTIONS TWICE DAILY 100 each 5  . Blood Glucose Monitoring Suppl (ACCU-CHEK GUIDE) w/Device KIT 1 Piece by Does not apply route as directed. 1 kit 0  . furosemide (LASIX) 40 MG tablet Take  1/2 tablets daily (60 mg)   May take additional 20 mg daily for Shortness of breath 100 tablet 3  . glipiZIDE (GLUCOTROL) 5 MG tablet Take 1 tablet (5 mg total) by mouth 2 (two) times  daily before a meal. 180 tablet 0  . glucose blood test strip 1 each by Other route in the morning, at noon, in the evening, and at bedtime. Use as instructed to check blood sugar four times daily 200 each 2  . HYDROcodone-acetaminophen (NORCO/VICODIN) 5-325 MG tablet One tablet every four hours for pain. 30 tablet 0  . insulin aspart protamine - aspart (NOVOLOG 70/30 MIX) (70-30) 100 UNIT/ML FlexPen Inject 0.7-0.8 mLs (70-80 Units total) into the skin 2 (two) times daily with a meal. When glucose is above 90 and you are eating. 30 mL 2  . Lancets (ONETOUCH DELICA PLUS [id number]) MISC USE TO TEST BLOOD SUGAR FOUR TIMES DAILY    . metFORMIN (GLUCOPHAGE) 500 MG tablet TAKE 1 TABLET(500 MG) BY MOUTH TWICE DAILY WITH A MEAL 180 tablet 1  . metoprolol succinate (TOPROL-XL) 100 MG 24 hr tablet Take 1&1/2 tablets (150 mg) by mouth daily. Take with or immediately following a meal. 135 tablet 1  . NORLYDA 0.35 MG tablet Take 1 tablet by mouth daily.   12  . sacubitril-valsartan (ENTRESTO) 97-103 MG Take 1 tablet by mouth 2 (two) times daily. 60 tablet 5  . spironolactone (ALDACTONE) 25 MG tablet Take 0.5 tablets (12.5 mg total) by mouth daily. 90 tablet 3  . tiZANidine (ZANAFLEX) 2 MG tablet Take 1 tablet (2 mg total) by mouth every 8 (eight) hours as needed for muscle spasms (caution causes drowsiness). 21 tablet 0   No current facility-administered medications for this visit.     Physical Exam  Blood pressure (!) 154/97, pulse 92, height 5' 7.5" (1.715 m), weight 207 lb (93.9 kg).  Constitutional: overall normal hygiene, normal nutrition, well developed, normal grooming, normal body habitus. Assistive device:none  Musculoskeletal: gait and station Limp none, muscle tone and strength are normal, no tremors or atrophy is present.  .  Neurological: coordination overall normal.  Deep tendon reflex/nerve stretch intact.  Sensation normal.  Cranial nerves II-XII intact.   Skin:   Normal overall no  scars, lesions, ulcers or rashes. No psoriasis.  Psychiatric: Alert and oriented x 3.  Recent memory intact, remote memory unclear.  Normal mood and affect. Well groomed.  Good eye contact.  Cardiovascular: overall no swelling, no varicosities, no edema bilaterally, normal temperatures of the legs and arms, no clubbing, cyanosis and good capillary refill.  Lymphatic: palpation is normal.   She has good ROM of the neck and shoulder on the right.  She has some weakness of triceps and strength.  She complains of decreased sensation of dorsum of index finger but ROM is full.  All other systems reviewed and are negative   The patient has  been educated about the nature of the problem(s) and counseled on treatment options.  The patient appeared to understand what I have discussed and is in agreement with it. Encounter Diagnoses  Name Primary?  . Chronic pain in right shoulder Yes  . Neck pain on right side    I am concerned about HNP of the neck area and she might need surgery. PLAN Call if any problems.  Precautions discussed.  Continue current medications.   Return to clinic 2 weeks   Get MRI.  If unable to secondary to the defibrillator, then neurosurgery needs to see her.   She understands.  I have reviewed the [employer] web site prior to prescribing narcotic medicine for this patient.   Electronically Signed Sanjuana Kava, MD 7/13/20218:16 AM

## 2019-10-19 NOTE — Telephone Encounter (Signed)
Patient called, message regarding her CPAP sent to her MD.

## 2019-10-19 NOTE — Telephone Encounter (Signed)
Notified pt that her device is MRI-conditional.  Radiology rech should send [place] a form to complete prior to procedure.  Procedure has not been scheduled as of yet.

## 2019-10-20 ENCOUNTER — Ambulatory Visit (HOSPITAL_COMMUNITY): Payer: 59 | Admitting: Physical Therapy

## 2019-10-20 ENCOUNTER — Encounter (HOSPITAL_COMMUNITY): Payer: Self-pay | Admitting: Specialist

## 2019-10-20 ENCOUNTER — Ambulatory Visit (HOSPITAL_COMMUNITY): Payer: 59 | Admitting: Specialist

## 2019-10-20 ENCOUNTER — Telehealth: Payer: Self-pay | Admitting: Orthopaedic Surgery

## 2019-10-20 ENCOUNTER — Other Ambulatory Visit: Payer: Self-pay | Admitting: "Endocrinology

## 2019-10-20 DIAGNOSIS — M25511 Pain in right shoulder: Secondary | ICD-10-CM | POA: Diagnosis not present

## 2019-10-20 DIAGNOSIS — R29898 Other symptoms and signs involving the musculoskeletal system: Secondary | ICD-10-CM

## 2019-10-20 NOTE — Therapy (Signed)
[place], [place], [address] Phone: [phone]   Fax:  [phone]  Occupational Therapy Treatment  Patient Details  Name: Tara Bryant MRN: [id number] Date of Birth: 01-Nov-1970 Referring Provider (OT): Dr. Sanjuana Kava   Encounter Date: 10/20/2019   OT End of Session - 10/20/19 2230    Visit Number 5    Number of Visits 6    Date for OT Re-Evaluation 10/22/19    Authorization Type [employer]    Authorization Time Period 30 visit limit OT/PT combined    Authorization - Visit Number 5    Authorization - Number of Visits 30    OT Start Time 1350    OT Stop Time 1425    OT Time Calculation (min) 35 min    Activity Tolerance Patient tolerated treatment well;Patient limited by pain    Behavior During Therapy [facility] for tasks assessed/performed           Past Medical History:  Diagnosis Date  . Arrhythmia   . CAD (coronary artery disease)    a. 03/2018: cath showing 60% mid-LAD stenosis not significant by FFR.   Marland Kitchen Chronic combined systolic (congestive) and diastolic (congestive) heart failure ([place])    a. 03/2018: found to have a newly reduced EF of 20% by echocardiogram  . Depression   . Diabetes mellitus   . Hypertension   . Microproteinuria     Past Surgical History:  Procedure Laterality Date  . BACK SURGERY    . BREAST BIOPSY Left 2017   benign  . ICD IMPLANT N/A 10/29/2018   Procedure: ICD IMPLANT;  Surgeon: Evans Lance, MD;  Location: [facility];  Service: Cardiovascular;  Laterality: N/A;  . INTRAVASCULAR PRESSURE WIRE/FFR STUDY N/A 03/24/2018   Procedure: INTRAVASCULAR PRESSURE WIRE/FFR STUDY;  Surgeon: Leonie Man, MD;  Location: [facility];  Service: Cardiovascular;  Laterality: N/A;  . NOSE SURGERY    . RIGHT/LEFT HEART CATH AND CORONARY ANGIOGRAPHY N/A 03/24/2018   Procedure: RIGHT/LEFT HEART CATH AND CORONARY ANGIOGRAPHY;  Surgeon: Leonie Man, MD;   Location: [facility];  Service: Cardiovascular;  Laterality: N/A;  . TONSILLECTOMY    . WISDOM TOOTH EXTRACTION      There were no vitals filed for this visit.   Subjective Assessment - 10/20/19 2228    Subjective  S:  I went back to Dr. Luna Glasgow.  I told him my pain wasnt any better and he wants me to get an mri.  I dont think the therapy is helping.    Currently in Pain? Yes    Pain Score 7     Pain Location Shoulder    Pain Orientation Right    Pain Descriptors / Indicators Aching;Burning              [facility] OT Assessment - 10/20/19 0001      Assessment   Medical Diagnosis right shoulder pain    Referring Provider (OT) Dr. Sanjuana Kava      Precautions   Precautions None    Precaution Comments defib. placement                     OT Treatments/Exercises (OP) - 10/20/19 0001      Exercises   Exercises Shoulder;Neck      Neck Exercises: Seated   Neck Retraction 10 reps    Cervical Rotation 10 reps    Lateral Flexion 10 reps  Shoulder Exercises: Supine   Protraction AAROM;10 reps    Horizontal ABduction AAROM;10 reps    External Rotation AAROM;10 reps    Internal Rotation AAROM;10 reps    Flexion AAROM;10 reps    ABduction AAROM;10 reps      Shoulder Exercises: Standing   Extension Theraband;10 reps    Theraband Level (Shoulder Extension) Level 3 (Green)    Row Theraband;10 reps    Theraband Level (Shoulder Row) Level 2 (Red)    Retraction Theraband;10 reps    Theraband Level (Shoulder Retraction) Level 2 (Red)      Shoulder Exercises: Therapy Ball   Other Therapy Ball Exercises large green therapy ball:  chest press, overhead press, shoulder flexion 10 times, walk ball up and down wall 10 times       Shoulder Exercises: ROM/Strengthening   UBE (Upper Arm Bike) 4' reverse at 1.0 5.5 intensity                  OT Education - 10/20/19 2229    Education Details educated patient on cervical, upper trap, levator scapulae  stretches to provide some relief to pain in shoulder and cervical region    Person(s) Educated Patient    Methods Explanation;Handout    Comprehension Verbalized understanding;Returned demonstration            OT Short Term Goals - 10/20/19 2234      OT SHORT TERM GOAL #1   Title Pt will be provided with and educated on HEP to improve mobility of RUE and cervical neck regions required for ADL completion.    Time 3    Period Weeks    Status Achieved    Target Date 10/22/19      OT SHORT TERM GOAL #2   Title Pt will increase RUE A/ROM to [facility] to improve ability to reach overhead and behind back during dressing tasks.    Time 3    Period Weeks    Status Not Met      OT SHORT TERM GOAL #3   Title Pt will increase cervical A/ROM to [facility] to improve ability to turn head to look over shoulder when driving.    Time 3    Period Weeks    Status Achieved      OT SHORT TERM GOAL #4   Title Pt will decrease RUE fascial restrictions to minimal amounts to improve mobility required for functional reaching tasks.    Time 3    Period Weeks    Status Not Met      OT SHORT TERM GOAL #5   Title Pt will increase RUE strength to 4+/5 or greater to improve ability to perform light lifting tasks during ADLs.    Time 3    Period Weeks    Status Not Met      OT SHORT TERM GOAL #6   Title Pt will decrease RUE and cervical neck pain to 4/10 or less to improve ability to sleep in the bed versus the recliner.    Time 3    Period Weeks    Status On-going                    Plan - 10/20/19 2231    Clinical Impression Statement A:  patient continues to experience magnified pain and nerve hypersensitivity in her RUE.  Reports MD appt yesterday with plans for an MRI.  As OT not benefitting patient, will place paitent on hold until after [email or web address]  patient on cervical stretches this date in order to alleviate some pressure in cervical region and to improve posture.    Body Structure /  Function / Physical Skills ADL;Endurance;UE functional use;Fascial restriction;Pain;ROM;IADL;Strength;Mobility    Plan P:  Hold OT until after MRI.           Patient will benefit from skilled therapeutic intervention in order to improve the following deficits and impairments:   Body Structure / Function / Physical Skills: ADL, Endurance, UE functional use, Fascial restriction, Pain, ROM, IADL, Strength, Mobility       Visit Diagnosis: Acute pain of right shoulder  Other symptoms and signs involving the musculoskeletal system    Problem List Patient Active Problem List   Diagnosis Date Noted  . OSA (obstructive sleep apnea) 09/30/2019  . Chronic systolic heart failure ([place]) 02/04/2019  . ICD (implantable cardioverter-defibrillator) in place 02/04/2019  . Uncontrolled type 2 diabetes mellitus with hyperglycemia ([place]) 06/10/2018  . Mixed hyperlipidemia 06/10/2018  . Family history of left ventricular noncompaction   . Dilated cardiomyopathy ([place]): EF 20%, global hypokinesis. 03/23/2018  . Acute combined systolic and diastolic heart failure ([place]) 03/23/2018  . Hirsutism 11/04/2016  . Perimenopause 07/28/2015  . Irritable bowel syndrome 09/01/2014  . Gastritis 05/11/2014  . Hypertriglyceridemia, essential 03/08/2014  . Type 2 diabetes mellitus without complication, with long-term current use of insulin ([place]) 06/29/2013  . Pain in joint, lower leg 12/01/2012  . Venous stasis of lower extremity 12/01/2012  . Depression 12/01/2012  . Essential hypertension, benign 07/30/2012   Vangie Bicker, MHA, OTR/L [phone]  10/20/2019, 10:40 PM  [facility] [address] [place], [place], [address] Phone: [phone]   Fax:  [phone]  Name: Tara Bryant MRN: [id number] Date of Birth: Apr 17, 1970

## 2019-10-20 NOTE — Patient Instructions (Signed)
  Flexibility: Upper Trapezius Stretch   Gently grasp right side of head while reaching behind back with other hand. Tilt head away until a gentle stretch is felt. Hold ____ seconds. Repeat ____ times per set. Do ____ sets per session. Do ____ sessions per day.  http://orth.[email or web address].us/340   Levator Stretch   Grasp seat or sit on hand on side to be stretched. Turn head toward other side and look down. Use hand on head to gently stretch neck in that position. Hold ____ seconds. Repeat on other side. Repeat ____ times. Do ____ sessions per day.  [email or web address]://[email or web address].us/30   Scapular Retraction (Standing)   With arms at sides, pinch shoulder blades together. Repeat ____ times per set. Do ____ sets per session. Do ____ sessions per day.  http://orth.exer.us/944   Flexibility: Neck Retraction   Pull head straight back, keeping eyes and jaw level. Repeat ____ times per set. Do ____ sets per session. Do ____ sessions per day.  http://orth.exer.us/344   Posture - Sitting   Sit upright, head facing forward. Try using a roll to support lower back. Keep shoulders relaxed, and avoid rounded back. Keep hips level with knees. Avoid crossing legs for long periods.   Flexibility: Corner Stretch   Standing in corner with hands just above shoulder level and feet ____ inches from corner, lean forward until a comfortable stretch is felt across chest. Hold ____ seconds. Repeat ____ times per set. Do ____ sets per session. Do ____ sessions per day.  http://orth.exer.us/342   Copyright  VHI. All rights reserved.

## 2019-10-20 NOTE — Telephone Encounter (Signed)
[name] called this morning wanting to know if we had gotten approval for her MRI.  She said that she spoke to someone at the cardiologist office about her defibrillator and there shouldn't be a problem with that.  Not sure exactly who she spoke to.   She would like for you to call her this afternoon regarding the progress of her MRI.  She will be at therapy in the early afternoon and requests that you call her on her cell phone.  Thanks

## 2019-10-21 ENCOUNTER — Other Ambulatory Visit: Payer: Self-pay | Admitting: Orthopaedic Surgery

## 2019-10-21 DIAGNOSIS — M542 Cervicalgia: Secondary | ICD-10-CM

## 2019-10-22 ENCOUNTER — Other Ambulatory Visit: Payer: Self-pay

## 2019-10-22 ENCOUNTER — Ambulatory Visit: Payer: 59 | Admitting: Family Medicine

## 2019-10-22 ENCOUNTER — Encounter: Payer: Self-pay | Admitting: Family Medicine

## 2019-10-22 ENCOUNTER — Ambulatory Visit (INDEPENDENT_AMBULATORY_CARE_PROVIDER_SITE_OTHER): Payer: 59 | Admitting: Family Medicine

## 2019-10-22 VITALS — BP 124/80 | HR 98 | Temp 98.0°F | Ht 67.5 in | Wt 206.0 lb

## 2019-10-22 DIAGNOSIS — N644 Mastodynia: Secondary | ICD-10-CM

## 2019-10-22 DIAGNOSIS — Z803 Family history of malignant neoplasm of breast: Secondary | ICD-10-CM | POA: Diagnosis not present

## 2019-10-22 DIAGNOSIS — W57XXXA Bitten or stung by nonvenomous insect and other nonvenomous arthropods, initial encounter: Secondary | ICD-10-CM

## 2019-10-22 NOTE — Progress Notes (Signed)
[name], female    DOB: 1971/01/21, 49 y.o.   MRN: [id number]   Chief Complaint  Patient presents with  . Breast Pain   Subjective:    HPI  CC-bilateral breast pain is intermittent.  Rt upper breast/axillary area with pain.  Today no pain in left breast. Needs order for diagnostic mammo and ultrasound per the breast center in . Pain started about one month ago.   Also would like spot where a tick bite her on right shoulder.   Went to ortho for rt shoulder pain- she said they thinking pinched nerve in neck.  Ortho will schedule an mri. OT 5x and didn't help any. Not sleeping in bed, having sleep sitting up.  Pain in both breasts. Rt upper chest/axilla pain. Mom and some cousins and 2 aunts with breast cancer.  Mom- 15's when dx. Sister -61 dx with breast cancer.  Has defib on left upper chest, but thinks some of the pain could be from that being newly in place.  Had tick bite on rt upper shoulder. Removed tick a couple weeks ago. No fever, or body aches. Small amt itching with it still.    Medical History Tara Bryant has a past medical history of Arrhythmia, CAD (coronary artery disease), Chronic combined systolic (congestive) and diastolic (congestive) heart failure ([place]), Depression, Diabetes mellitus, Hypertension, and Microproteinuria.   Outpatient Encounter Medications as of 10/22/2019  Medication Sig  . apixaban (ELIQUIS) 5 MG TABS tablet Take 1 tablet (5 mg total) by mouth 2 (two) times daily.  Marland Kitchen atorvastatin (LIPITOR) 10 MG tablet Take 1 tablet (10 mg total) by mouth daily.  . B-D ULTRAFINE III SHORT PEN 31G X 8 MM MISC USE FOR INSULIN INJECTIONS TWICE DAILY  . Blood Glucose Monitoring Suppl (ACCU-CHEK GUIDE) w/Device KIT 1 Piece by Does not apply route as directed.  . furosemide (LASIX) 40 MG tablet Take  1/2 tablets daily (60 mg)   May take additional 20 mg daily for Shortness of breath  . glipiZIDE (GLUCOTROL) 5 MG tablet Take 1 tablet  (5 mg total) by mouth 2 (two) times daily before a meal.  . glucose blood test strip 1 each by Other route in the morning, at noon, in the evening, and at bedtime. Use as instructed to check blood sugar four times daily  . Lancets (ONETOUCH DELICA PLUS [id number]) MISC USE TO TEST BLOOD SUGAR FOUR TIMES DAILY  . metFORMIN (GLUCOPHAGE) 500 MG tablet TAKE 1 TABLET(500 MG) BY MOUTH TWICE DAILY WITH A MEAL  . metoprolol succinate (TOPROL-XL) 100 MG 24 hr tablet Take 1&1/2 tablets (150 mg) by mouth daily. Take with or immediately following a meal.  . NORLYDA 0.35 MG tablet Take 1 tablet by mouth daily.   . sacubitril-valsartan (ENTRESTO) 97-103 MG Take 1 tablet by mouth 2 (two) times daily.  Marland Kitchen tiZANidine (ZANAFLEX) 2 MG tablet Take 1 tablet (2 mg total) by mouth every 8 (eight) hours as needed for muscle spasms (caution causes drowsiness).  . [DISCONTINUED] HYDROcodone-acetaminophen (NORCO/VICODIN) 5-325 MG tablet One tablet every four hours for pain.  . [DISCONTINUED] insulin aspart protamine - aspart (NOVOLOG 70/30 MIX) (70-30) 100 UNIT/ML FlexPen Inject 0.7-0.8 mLs (70-80 Units total) into the skin 2 (two) times daily with a meal. When glucose is above 90 and you are eating.  . [DISCONTINUED] spironolactone (ALDACTONE) 25 MG tablet Take 0.5 tablets (12.5 mg total) by mouth daily.   No facility-administered encounter medications on file as of 10/22/2019.  Review of Systems  Constitutional: Negative for chills and fever.  HENT: Negative for congestion, rhinorrhea and sore throat.   Respiratory: Negative for cough, shortness of breath and wheezing.   Cardiovascular: Negative for chest pain and leg swelling.       +bilateral breast pain.  Gastrointestinal: Negative for abdominal pain, diarrhea, nausea and vomiting.  Genitourinary: Negative for dysuria and frequency.  Musculoskeletal: Negative for arthralgias and back pain.  Skin: Negative for rash.  Neurological: Negative for dizziness, weakness  and headaches.     Vitals BP 124/80   Pulse 98   Temp 98 F (36.7 C)   Ht 5' 7.5" (1.715 m)   Wt 206 lb (93.4 kg)   SpO2 99%   BMI 31.79 kg/m   Objective:   Physical Exam Vitals and nursing note reviewed.  Constitutional:      Appearance: Normal appearance.  HENT:     Head: Normocephalic and atraumatic.     Nose: Nose normal.     Mouth/Throat:     Mouth: Mucous membranes are moist.     Pharynx: Oropharynx is clear.  Eyes:     Extraocular Movements: Extraocular movements intact.     Conjunctiva/sclera: Conjunctivae normal.     Pupils: Pupils are equal, round, and reactive to light.  Cardiovascular:     Rate and Rhythm: Normal rate and regular rhythm.     Pulses: Normal pulses.     Heart sounds: Normal heart sounds.  Pulmonary:     Effort: Pulmonary effort is normal.     Breath sounds: Normal breath sounds. No wheezing, rhonchi or rales.  Chest:     Comments: +rt breast pain in rt upper quad, about 11 oclock. No skin changes, erythema, nipple drainge/discharge.  No ttp over the left breast. Normal inspection to left breast. Musculoskeletal:        General: Normal range of motion.     Right lower leg: No edema.     Left lower leg: No edema.  Skin:    General: Skin is warm and dry.     Findings: No lesion or rash.     Comments: Rt shoulder- insect bite, healing.  No EM.  Neurological:     General: No focal deficit present.     Mental Status: She is alert and oriented to person, place, and time.  Psychiatric:        Mood and Affect: Mood normal.        Behavior: Behavior normal.     Assessment and Plan   1. Breast pain, right - US BREAST LTD UNI LEFT INC AXILLA - MM DIAG BREAST TOMO BILATERAL  2. Family history of breast cancer in mother - US BREAST LTD UNI LEFT INC AXILLA - MM DIAG BREAST TOMO BILATERAL  3. Tick bite, initial encounter   Rt breast pain- Need diagnostic mammo and u/s due to breast pain.  -seeing ortho for rt shoulder pain.  They  refilled her norco.  Tick bite- improving. No surrounding erythema or bull's eye rash.  F/u prn.

## 2019-10-25 ENCOUNTER — Telehealth: Payer: Self-pay | Admitting: "Endocrinology

## 2019-10-25 ENCOUNTER — Other Ambulatory Visit: Payer: Self-pay

## 2019-10-25 DIAGNOSIS — E1165 Type 2 diabetes mellitus with hyperglycemia: Secondary | ICD-10-CM

## 2019-10-25 MED ORDER — INSULIN ASPART PROT & ASPART (70-30 MIX) 100 UNIT/ML PEN
70.0000 [IU] | PEN_INJECTOR | Freq: Two times a day (BID) | SUBCUTANEOUS | 2 refills | Status: DC
Start: 2019-10-25 — End: 2020-01-13

## 2019-10-25 NOTE — Telephone Encounter (Signed)
Sent in

## 2019-10-25 NOTE — Telephone Encounter (Signed)
Patient is saying the pharmacy did not ever receive her RX for insulin aspart protamine - aspart (NOVOLOG 70/30 MIX) (70-30) 100 UNIT/ML FlexPen that was sent in on 6/28. She is asking that to be re sent to [facility]

## 2019-10-26 ENCOUNTER — Ambulatory Visit (HOSPITAL_COMMUNITY): Payer: 59 | Admitting: Occupational Therapy

## 2019-11-03 ENCOUNTER — Ambulatory Visit (HOSPITAL_COMMUNITY)
Admission: RE | Admit: 2019-11-03 | Discharge: 2019-11-03 | Disposition: A | Discharge status: Home / Self Care | Payer: 59 | Source: Ambulatory Visit | Attending: Internal Medicine | Admitting: Internal Medicine

## 2019-11-03 ENCOUNTER — Other Ambulatory Visit: Payer: Self-pay

## 2019-11-03 VITALS — BP 135/85 | HR 90 | Wt 206.0 lb

## 2019-11-03 DIAGNOSIS — Z79899 Other long term (current) drug therapy: Secondary | ICD-10-CM | POA: Diagnosis not present

## 2019-11-03 DIAGNOSIS — Z833 Family history of diabetes mellitus: Secondary | ICD-10-CM | POA: Diagnosis not present

## 2019-11-03 DIAGNOSIS — G4733 Obstructive sleep apnea (adult) (pediatric): Secondary | ICD-10-CM | POA: Insufficient documentation

## 2019-11-03 DIAGNOSIS — I272 Pulmonary hypertension, unspecified: Secondary | ICD-10-CM | POA: Diagnosis not present

## 2019-11-03 DIAGNOSIS — I2510 Atherosclerotic heart disease of native coronary artery without angina pectoris: Secondary | ICD-10-CM | POA: Diagnosis not present

## 2019-11-03 DIAGNOSIS — Z95810 Presence of automatic (implantable) cardiac defibrillator: Secondary | ICD-10-CM | POA: Diagnosis not present

## 2019-11-03 DIAGNOSIS — Z794 Long term (current) use of insulin: Secondary | ICD-10-CM | POA: Insufficient documentation

## 2019-11-03 DIAGNOSIS — Z7901 Long term (current) use of anticoagulants: Secondary | ICD-10-CM | POA: Insufficient documentation

## 2019-11-03 DIAGNOSIS — N644 Mastodynia: Secondary | ICD-10-CM | POA: Insufficient documentation

## 2019-11-03 DIAGNOSIS — I428 Other cardiomyopathies: Secondary | ICD-10-CM | POA: Diagnosis not present

## 2019-11-03 DIAGNOSIS — Z8249 Family history of ischemic heart disease and other diseases of the circulatory system: Secondary | ICD-10-CM | POA: Insufficient documentation

## 2019-11-03 DIAGNOSIS — G4739 Other sleep apnea: Secondary | ICD-10-CM | POA: Diagnosis not present

## 2019-11-03 DIAGNOSIS — I110 Hypertensive heart disease with heart failure: Secondary | ICD-10-CM | POA: Diagnosis not present

## 2019-11-03 DIAGNOSIS — I5022 Chronic systolic (congestive) heart failure: Secondary | ICD-10-CM

## 2019-11-03 DIAGNOSIS — Z803 Family history of malignant neoplasm of breast: Secondary | ICD-10-CM | POA: Diagnosis not present

## 2019-11-03 DIAGNOSIS — E119 Type 2 diabetes mellitus without complications: Secondary | ICD-10-CM | POA: Diagnosis not present

## 2019-11-03 MED ORDER — SPIRONOLACTONE 25 MG PO TABS: 25.0000 mg | ORAL_TABLET | Freq: Every day | ORAL | 3 refills | Status: DC

## 2019-11-03 NOTE — Addendum Note (Signed)
Encounter addended by: Marlise Eves, RN on: 11/03/2019 11:55 AM  Actions taken: Order list changed, Diagnosis association updated, Clinical Note Signed

## 2019-11-03 NOTE — Progress Notes (Signed)
ADVANCED HF CLINIC NOTE  Referring Physician: Dr. Harl Bowie Primary Care: Mickie Hillier Primary Cardiologist: Harl Bowie  HPI:  Tara Bryant is a 49 y/o with ho systolic HF due to LVNC referred by Dr. Harl Bowie for HF Consultation.   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  Feels ok from a heart perspective. Occasional pedal edema. Main issue is pinched nerve I neck going down right arm. Sleeping in recliner. Having breast pain. Not related to exertion.   ICD interrogation: HL score 1. No VT/AF. Activity 2.3hr/day Personally reviewed    Past Medical History:  Diagnosis Date  . Arrhythmia   . CAD (coronary artery disease)    a. 03/2018: cath showing 60% mid-LAD stenosis not significant by FFR.   Marland Kitchen Chronic combined systolic (congestive) and diastolic (congestive) heart failure ([place])    a. 03/2018: found to have a newly reduced EF of 20% by echocardiogram  . Depression   . Diabetes mellitus   . Hypertension   . Microproteinuria     Current Outpatient Medications  Medication Sig Dispense Refill  . apixaban (ELIQUIS) 5 MG TABS tablet Take 1 tablet (5 mg total) by mouth 2 (two) times daily. 180 tablet 3  . atorvastatin (LIPITOR) 10 MG tablet Take 1 tablet (10 mg total) by mouth daily. 30 tablet 5  . B-D ULTRAFINE III SHORT PEN 31G X 8 MM MISC USE FOR INSULIN INJECTIONS TWICE DAILY 100 each 5  . Blood  Glucose Monitoring Suppl (ACCU-CHEK GUIDE) w/Device KIT 1 Piece by Does not apply route as directed. 1 kit 0  . furosemide (LASIX) 40 MG tablet Take  1/2 tablets daily (60 mg)   May take additional 20 mg daily for Shortness of breath 100 tablet 3  . glipiZIDE (GLUCOTROL) 5 MG tablet Take 1 tablet (5 mg total) by mouth 2 (two) times daily before a meal. 180 tablet 0  . glucose blood test strip 1 each by Other route in the morning, at noon, in the evening, and at bedtime. Use as instructed to check blood sugar four times daily 200 each 2  . HYDROcodone-acetaminophen (NORCO/VICODIN) 5-325 MG tablet One tablet every four hours for pain. 30 tablet 0  . insulin aspart protamine - aspart (NOVOLOG 70/30 MIX) (70-30) 100 UNIT/ML FlexPen Inject 0.7-0.8 mLs (70-80 Units total) into the skin 2 (two) times daily with a meal. When glucose is above 90 and you are eating. 30 mL 2  . Lancets (ONETOUCH DELICA PLUS [id number]) MISC USE TO TEST BLOOD SUGAR FOUR TIMES DAILY    . metFORMIN (GLUCOPHAGE) 500 MG tablet TAKE 1 TABLET(500 MG) BY MOUTH TWICE DAILY WITH A MEAL 180 tablet 1  . metoprolol succinate (TOPROL-XL) 100 MG 24 hr tablet Take 1&1/2 tablets (150 mg) by mouth daily. Take with or immediately following a meal. 135 tablet 1  . NORLYDA 0.35 MG tablet Take 1 tablet by mouth daily.   12  . sacubitril-valsartan (ENTRESTO) 97-103 MG Take 1 tablet by mouth  2 (two) times daily. 60 tablet 5  . spironolactone (ALDACTONE) 25 MG tablet Take 0.5 tablets (12.5 mg total) by mouth daily. 90 tablet 3  . tiZANidine (ZANAFLEX) 2 MG tablet Take 1 tablet (2 mg total) by mouth every 8 (eight) hours as needed for muscle spasms (caution causes drowsiness). 21 tablet 0   No current facility-administered medications for this encounter.    Allergies  Allergen Reactions  . Aspartame And Phenylalanine Dermatitis      Social History   Socioeconomic History  . Marital status: Married    Spouse name: Not on file  . Number of  children: Not on file  . Years of education: Not on file  . Highest education level: Not on file  Occupational History  . Not on file  Tobacco Use  . Smoking status: Never Smoker  . Smokeless tobacco: Never Used  Vaping Use  . Vaping Use: Never used  Substance and Sexual Activity  . Alcohol use: No  . Drug use: No  . Sexual activity: Yes    Birth control/protection: Pill  Other Topics Concern  . Not on file  Social History Narrative  . Not on file   Social Determinants of Health   Financial Resource Strain:   . Difficulty of Paying Living Expenses:   Food Insecurity:   . Worried About Charity fundraiser in the Last Year:   . Arboriculturist in the Last Year:   Transportation Needs:   . Film/video editor (Medical):   Marland Kitchen Lack of Transportation (Non-Medical):   Physical Activity:   . Days of Exercise per Week:   . Minutes of Exercise per Session:   Stress:   . Feeling of Stress :   Social Connections:   . Frequency of Communication with Friends and Family:   . Frequency of Social Gatherings with Friends and Family:   . Attends Religious Services:   . Active Member of Clubs or Organizations:   . Attends Archivist Meetings:   Marland Kitchen Marital Status:   Intimate Partner Violence:   . Fear of Current or Ex-Partner:   . Emotionally Abused:   Marland Kitchen Physically Abused:   . Sexually Abused:       Family History  Problem Relation Age of Onset  . Stroke Sister 62       smoker, diabetic, HTN  . Breast cancer Sister   . Cancer Mother        breast  . Breast cancer Mother   . Hypertension Mother   . Diabetes Other   . Heart attack Other   . Chronic Renal Failure Father     Vitals:   11/03/19 1112  BP: (!) 135/85  Pulse: 90  SpO2: 98%  Weight: (!) 93.4 kg (206 lb)    PHYSICAL EXAM: General:  Fatigued appearing. No resp difficulty HEENT: normal Neck: supple. no JVD. Carotids 2+ bilat; no bruits. No lymphadenopathy or thryomegaly appreciated. Cor: PMI  nondisplaced. Regular rate & rhythm. No rubs, gallops or murmurs. Lungs: clear Abdomen: obese soft, nontender, nondistended. No hepatosplenomegaly. No bruits or masses. Good bowel sounds. Extremities: obese no cyanosis, clubbing, rash, tr-1+ edema Neuro: alert & orientedx3, cranial nerves grossly intact. moves all 4 extremities w/o difficulty. Flat affect. 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  Continue lasix 60 daily - Continue Entresto to 97/103 bid - Continue Toprol 150 daily.  - Increase spiro to 25 daily. BMET 1 week  - Have suggested [place] but Dr. Dorris Fetch has told her not to take. It. Explained that for patients like her benefits far outweightthe risk.  - Referred for Genetics testing but couldn't afford. Has one daughter and had echo at [facility] which was ok. Has a sister and a brother both who have kids. She will discuss with her brother.  - ICD interrogated personally HL score 1 no VT. Activity level 2.3 hours - Will repeat Echo   2. Pulmonary hypertension - likely mixed picture  3. CAD - moderate nonobstructive disease by cath -doubt breast pain is ischemic  4. DM2 - consider SGLT2i.  - I wil d/w Dr. Dorris Fetch  5. OSA - Sleep study 10/20 AHI 18 c/w moderate OSA - Followed by Dr. Reather Laurence, MD  11:32 AM

## 2019-11-03 NOTE — Addendum Note (Signed)
Encounter addended by: Marlise Eves, RN on: 11/03/2019 12:02 PM  Actions taken: Order list changed

## 2019-11-03 NOTE — Patient Instructions (Signed)
Lab work will need to be done in 1-2 weeks at [place] has requested that you have an echocardiogram. Echocardiography is a painless test that uses sound waves to create images of your heart. It provides your doctor with information about the size and shape of your heart and how well your heart's chambers and valves are working. This procedure takes approximately one hour. There are no restrictions for this procedure. This will be done at [facility]  INCREASE Spironolactone to 25mg  tab daily.  Please follow up with the [facility] in 6 months. We currently do not have that schedule. Please call in October 2021 in order to schedule your appointment for December 2021. Please call [phone] option #3 for scheduling.   At the [facility], you and your health needs are our priority. As part of our continuing mission to provide you with exceptional heart care, we have created designated Provider Care Teams. These Care Teams include your primary Cardiologist (physician) and Advanced Practice Providers (APPs- Physician Assistants and Nurse Practitioners) who all work together to provide you with the care you need, when you need it.   You may see any of the following providers on your designated Care Team at your next follow up: Marland Kitchen Dr Glori Bickers . Dr Loralie Champagne . Darrick Grinder, NP . Lyda Jester, PA . Audry Riles, PharmD   Please be sure to bring in all your medications bottles to every appointment.

## 2019-11-04 ENCOUNTER — Telehealth: Payer: Self-pay | Admitting: Orthopaedic Surgery

## 2019-11-04 NOTE — Telephone Encounter (Signed)
Patient called for refill of medication  HYDROcodone-acetaminophen (NORCO/VICODIN) 5-325 MG tablet 30 tablet  -[place]

## 2019-11-05 ENCOUNTER — Ambulatory Visit (INDEPENDENT_AMBULATORY_CARE_PROVIDER_SITE_OTHER): Payer: 59 | Admitting: *Deleted

## 2019-11-05 DIAGNOSIS — I420 Dilated cardiomyopathy: Secondary | ICD-10-CM

## 2019-11-05 LAB — CUP PACEART REMOTE DEVICE CHECK
Battery Remaining Longevity: 174 mo
Battery Remaining Percentage: 100 %
Brady Statistic RV Percent Paced: 0 %
Date Time Interrogation Session: 20210730060100
HighPow Impedance: 73 Ohm
Implantable Lead Implant Date: 20200723
Implantable Lead Location: 753860
Implantable Lead Model: 293
Implantable Lead Serial Number: 443823
Implantable Pulse Generator Implant Date: 20200723
Lead Channel Impedance Value: 503 Ohm
Lead Channel Setting Pacing Amplitude: 3.5 V
Lead Channel Setting Pacing Pulse Width: 0.4 ms
Lead Channel Setting Sensing Sensitivity: 0.5 mV
Pulse Gen Serial Number: 256025

## 2019-11-08 MED ORDER — HYDROCODONE-ACETAMINOPHEN 5-325 MG PO TABS: 1.0000 | ORAL_TABLET | Freq: Four times a day (QID) | ORAL | 0 refills | Status: AC | PRN

## 2019-11-09 NOTE — Progress Notes (Signed)
Remote ICD transmission.   

## 2019-11-10 ENCOUNTER — Ambulatory Visit: Payer: 59

## 2019-11-10 ENCOUNTER — Other Ambulatory Visit: Payer: Self-pay

## 2019-11-10 ENCOUNTER — Ambulatory Visit
Admission: RE | Admit: 2019-11-10 | Discharge: 2019-11-10 | Disposition: A | Discharge status: Home / Self Care | Payer: 59 | Source: Ambulatory Visit | Attending: Family Medicine | Admitting: Family Medicine

## 2019-11-11 ENCOUNTER — Ambulatory Visit: Payer: 59 | Admitting: Pulmonary Disease

## 2019-11-12 ENCOUNTER — Encounter: Payer: Self-pay | Admitting: Family Medicine

## 2019-11-15 ENCOUNTER — Ambulatory Visit (HOSPITAL_COMMUNITY)
Admission: RE | Admit: 2019-11-15 | Discharge: 2019-11-15 | Disposition: A | Discharge status: Home / Self Care | Payer: 59 | Source: Ambulatory Visit | Attending: Orthopaedic Surgery | Admitting: Orthopaedic Surgery

## 2019-11-15 ENCOUNTER — Other Ambulatory Visit: Payer: Self-pay

## 2019-11-15 DIAGNOSIS — M542 Cervicalgia: Secondary | ICD-10-CM | POA: Insufficient documentation

## 2019-11-15 NOTE — Progress Notes (Signed)
Per order turned tachy AND brady therapies to off for duration of MRI.   Will program device back to pre-MRI settings after completion of exam.

## 2019-11-15 NOTE — Progress Notes (Signed)
Informed of MRI for today.   Device system confirmed to be MRI conditional, with implant date > 6 weeks ago and no evidence of abandoned or epicardial leads in review of most recent CXR Interrogation from today performed personally by industry. VS, not pacer dependent.   Tachy AND brady therapies to off for duration of MRI.   Program device back to pre-MRI settings after completion of exam.  Shirley Friar, PA-C  11/15/2019 1:01 PM

## 2019-11-16 ENCOUNTER — Ambulatory Visit (HOSPITAL_COMMUNITY)
Admission: RE | Admit: 2019-11-16 | Discharge: 2019-11-16 | Disposition: A | Discharge status: Home / Self Care | Payer: 59 | Source: Ambulatory Visit | Attending: Internal Medicine | Admitting: Internal Medicine

## 2019-11-16 ENCOUNTER — Other Ambulatory Visit: Payer: Self-pay

## 2019-11-16 DIAGNOSIS — I5022 Chronic systolic (congestive) heart failure: Secondary | ICD-10-CM | POA: Diagnosis not present

## 2019-11-16 LAB — ECHOCARDIOGRAM COMPLETE
Area-P 1/2: 5.97 cm2
Calc EF: 35.3 %
MV M vel: 5.17 m/s
MV Peak grad: 106.9 mmHg
Radius: 0.4 cm
S' Lateral: 4.75 cm
Single Plane A2C EF: 36 %
Single Plane A4C EF: 35.1 %

## 2019-11-16 NOTE — Progress Notes (Signed)
*  PRELIMINARY RESULTS* Echocardiogram 2D Echocardiogram has been performed.  Samuel Germany 11/16/2019, 10:40 AM

## 2019-11-18 ENCOUNTER — Ambulatory Visit (INDEPENDENT_AMBULATORY_CARE_PROVIDER_SITE_OTHER): Payer: 59 | Admitting: Orthopaedic Surgery

## 2019-11-18 ENCOUNTER — Other Ambulatory Visit: Payer: Self-pay

## 2019-11-18 ENCOUNTER — Encounter: Payer: Self-pay | Admitting: Orthopaedic Surgery

## 2019-11-18 VITALS — BP 172/96 | HR 95 | Ht 67.0 in | Wt 205.4 lb

## 2019-11-18 DIAGNOSIS — M542 Cervicalgia: Secondary | ICD-10-CM | POA: Diagnosis not present

## 2019-11-18 DIAGNOSIS — G8929 Other chronic pain: Secondary | ICD-10-CM | POA: Diagnosis not present

## 2019-11-18 DIAGNOSIS — Z7901 Long term (current) use of anticoagulants: Secondary | ICD-10-CM | POA: Diagnosis not present

## 2019-11-18 DIAGNOSIS — M25511 Pain in right shoulder: Secondary | ICD-10-CM

## 2019-11-18 MED ORDER — HYDROCODONE-ACETAMINOPHEN 5-325 MG PO TABS: ORAL_TABLET | ORAL | 0 refills | Status: DC

## 2019-11-18 NOTE — Progress Notes (Signed)
Patient UU:[facility] Tara Bryant, female DOB:08/04/1970, 49 y.o. LKJ:[id number]  Chief Complaint  Patient presents with  . Neck Pain    Review MRI for C-SPINE/still having tingling and numbness    HPI  Tara Bryant is a 49 y.o. female who has neck pain with right sided paresthesias that is getting worse.  She had MRI done which showed: IMPRESSION: 1. Moderate-sized right subarticular to foraminal disc herniation at C6-7 with resultant mild spinal stenosis, with severe right C7 foraminal narrowing. 2. Right paracentral disc protrusions at C4-5 and C5-6 with resultant mild spinal stenosis and cord flattening.  I will have her see a neurosurgeon.  I have explained the findings to her.  I have independently reviewed the MRI.  I will refill her pain medicine.    Body mass index is 32.17 kg/m.  ROS  Review of Systems  Constitutional: Positive for activity change.  Cardiovascular: Positive for chest pain and palpitations.  Musculoskeletal: Positive for arthralgias, myalgias and neck pain.  All other systems reviewed and are negative.   All other systems reviewed and are negative.  The following is a summary of the past history medically, past history surgically, known current medicines, social history and family history.  This information is gathered electronically by the computer from prior information and documentation.  I review this each visit and have found including this information at this point in the chart is beneficial and informative.    Past Medical History:  Diagnosis Date  . Arrhythmia   . CAD (coronary artery disease)    a. 03/2018: cath showing 60% mid-LAD stenosis not significant by FFR.   Marland Kitchen Chronic combined systolic (congestive) and diastolic (congestive) heart failure ([place])    a. 03/2018: found to have a newly reduced EF of 20% by echocardiogram  . Depression   . Diabetes mellitus   . Hypertension   . Microproteinuria     Past Surgical History:   Procedure Laterality Date  . BACK SURGERY    . BREAST BIOPSY Left 2017   benign  . ICD IMPLANT N/A 10/29/2018   Procedure: ICD IMPLANT;  Surgeon: Evans Lance, MD;  Location: [place];  Service: Cardiovascular;  Laterality: N/A;  . INTRAVASCULAR PRESSURE WIRE/FFR STUDY N/A 03/24/2018   Procedure: INTRAVASCULAR PRESSURE WIRE/FFR STUDY;  Surgeon: Leonie Man, MD;  Location: [facility];  Service: Cardiovascular;  Laterality: N/A;  . NOSE SURGERY    . RIGHT/LEFT HEART CATH AND CORONARY ANGIOGRAPHY N/A 03/24/2018   Procedure: RIGHT/LEFT HEART CATH AND CORONARY ANGIOGRAPHY;  Surgeon: Leonie Man, MD;  Location: [place] [facility];  Service: Cardiovascular;  Laterality: N/A;  . TONSILLECTOMY    . WISDOM TOOTH EXTRACTION      Family History  Problem Relation Age of Onset  . Stroke Sister 52       smoker, diabetic, HTN  . Breast cancer Sister   . Cancer Mother        breast  . Breast cancer Mother   . Hypertension Mother   . Diabetes Other   . Heart attack Other   . Chronic Renal Failure Father     Social History Social History   Tobacco Use  . Smoking status: Never Smoker  . Smokeless tobacco: Never Used  Vaping Use  . Vaping Use: Never used  Substance Use Topics  . Alcohol use: No  . Drug use: No    Allergies  Allergen Reactions  . Aspartame And Phenylalanine Dermatitis  Current Outpatient Medications  Medication Sig Dispense Refill  . apixaban (ELIQUIS) 5 MG TABS tablet Take 1 tablet (5 mg total) by mouth 2 (two) times daily. 180 tablet 3  . atorvastatin (LIPITOR) 10 MG tablet Take 1 tablet (10 mg total) by mouth daily. 30 tablet 5  . B-D ULTRAFINE III SHORT PEN 31G X 8 MM MISC USE FOR INSULIN INJECTIONS TWICE DAILY 100 each 5  . Blood Glucose Monitoring Suppl (ACCU-CHEK GUIDE) w/Device KIT 1 Piece by Does not apply route as directed. 1 kit 0  . furosemide (LASIX) 40 MG tablet Take  1/2 tablets daily (60 mg)   May take additional 20 mg  daily for Shortness of breath 100 tablet 3  . glipiZIDE (GLUCOTROL) 5 MG tablet Take 1 tablet (5 mg total) by mouth 2 (two) times daily before a meal. 180 tablet 0  . glucose blood test strip 1 each by Other route in the morning, at noon, in the evening, and at bedtime. Use as instructed to check blood sugar four times daily 200 each 2  . insulin aspart protamine - aspart (NOVOLOG 70/30 MIX) (70-30) 100 UNIT/ML FlexPen Inject 0.7-0.8 mLs (70-80 Units total) into the skin 2 (two) times daily with a meal. When glucose is above 90 and you are eating. 30 mL 2  . Lancets (ONETOUCH DELICA PLUS [id number]) MISC USE TO TEST BLOOD SUGAR FOUR TIMES DAILY    . metFORMIN (GLUCOPHAGE) 500 MG tablet TAKE 1 TABLET(500 MG) BY MOUTH TWICE DAILY WITH A MEAL 180 tablet 1  . metoprolol succinate (TOPROL-XL) 100 MG 24 hr tablet Take 1&1/2 tablets (150 mg) by mouth daily. Take with or immediately following a meal. 135 tablet 1  . NORLYDA 0.35 MG tablet Take 1 tablet by mouth daily.   12  . sacubitril-valsartan (ENTRESTO) 97-103 MG Take 1 tablet by mouth 2 (two) times daily. 60 tablet 5  . spironolactone (ALDACTONE) 25 MG tablet Take 1 tablet (25 mg total) by mouth daily. 90 tablet 3  . tiZANidine (ZANAFLEX) 2 MG tablet Take 1 tablet (2 mg total) by mouth every 8 (eight) hours as needed for muscle spasms (caution causes drowsiness). 21 tablet 0   No current facility-administered medications for this visit.     Physical Exam  Blood pressure (!) 172/96, pulse 95, height 5' 7"  (1.702 m), weight 205 lb 6 oz (93.2 kg).  Constitutional: overall normal hygiene, normal nutrition, well developed, normal grooming, normal body habitus. Assistive device:none  Musculoskeletal: gait and station Limp none, muscle tone and strength are normal, no tremors or atrophy is present.  .  Neurological: coordination overall normal.  Deep tendon reflex/nerve stretch intact.  Sensation normal.  Cranial nerves II-XII intact.   Skin:    Normal overall no scars, lesions, ulcers or rashes. No psoriasis.  Psychiatric: Alert and oriented x 3.  Recent memory intact, remote memory unclear.  Normal mood and affect. Well groomed.  Good eye contact.  Cardiovascular: overall no swelling, no varicosities, no edema bilaterally, normal temperatures of the legs and arms, no clubbing, cyanosis and good capillary refill.  Lymphatic: palpation is normal.  She has neck pain, paresthesias to the index and long finger with some weakness of triceps.    All other systems reviewed and are negative   The patient has been educated about the nature of the problem(s) and counseled on treatment options.  The patient appeared to understand what I have discussed and is in agreement with it.  Encounter Diagnoses  Name  Primary?  . Cervicalgia Yes  . Chronic pain in right shoulder   . On continuous oral anticoagulation     PLAN Call if any problems.  Precautions discussed.  Continue current medications.   Return to clinic to neurosurgery   I have reviewed the [place] web site prior to prescribing narcotic medicine for this patient.   Electronically Signed Sanjuana Kava, MD 8/12/20219:28 AM

## 2019-11-23 ENCOUNTER — Other Ambulatory Visit: Payer: Self-pay | Admitting: "Endocrinology

## 2019-11-24 ENCOUNTER — Ambulatory Visit (INDEPENDENT_AMBULATORY_CARE_PROVIDER_SITE_OTHER): Payer: 59 | Admitting: Family Medicine

## 2019-11-24 ENCOUNTER — Encounter: Payer: Self-pay | Admitting: Family Medicine

## 2019-11-24 ENCOUNTER — Other Ambulatory Visit: Payer: Self-pay

## 2019-11-24 VITALS — BP 126/84 | HR 97 | Temp 97.0°F | Ht 67.0 in | Wt 204.8 lb

## 2019-11-24 DIAGNOSIS — I10 Essential (primary) hypertension: Secondary | ICD-10-CM | POA: Diagnosis not present

## 2019-11-24 DIAGNOSIS — M5020 Other cervical disc displacement, unspecified cervical region: Secondary | ICD-10-CM

## 2019-11-24 DIAGNOSIS — M4802 Spinal stenosis, cervical region: Secondary | ICD-10-CM

## 2019-11-24 DIAGNOSIS — E782 Mixed hyperlipidemia: Secondary | ICD-10-CM | POA: Diagnosis not present

## 2019-11-24 MED ORDER — ATORVASTATIN CALCIUM 10 MG PO TABS: 10.0000 mg | ORAL_TABLET | Freq: Every day | ORAL | 1 refills | Status: DC

## 2019-11-24 NOTE — Progress Notes (Signed)
[name], female    DOB: 06/19/1970, 49 y.o.   MRN: [id number]   Chief Complaint  Patient presents with  . Hyperlipidemia   Subjective:    HPI Pt having neck surgery due to pinched nerve, cervical stenosis, and herniated disc in cervical area.  Pt stating she is going to have surgery soon, seen by neurosurgeon. Had mammo- was normal.  MRI cervical area- IMPRESSION: 1. Moderate-sized right subarticular to foraminal disc herniation at C6-7 with resultant mild spinal stenosis, with severe right C7 foraminal narrowing. 2. Right paracentral disc protrusions at C4-5 and C5-6 with resultant mild spinal stenosis and cord flattening.  Taking norco for the pain. Sleeping in chair and feeling depressed about the pain. Saw NS 2 days ago.  Going to schedule the surgery. having numbness and tingling in hands and fingers.  Pain with raising arm over head and then tingling when bending down.    HTN Pt compliant with BP meds.  No SEs Denies chest pain, sob, LE swelling, or blurry vision.  DM2- seeing endocrinology. Pt doing well on medications.  No concerns.  Medical History Tisa has a past medical history of Arrhythmia, CAD (coronary artery disease), Chronic combined systolic (congestive) and diastolic (congestive) heart failure ([place]), Depression, Diabetes mellitus, Hypertension, and Microproteinuria.   Outpatient Encounter Medications as of 11/24/2019  Medication Sig  . apixaban (ELIQUIS) 5 MG TABS tablet Take 1 tablet (5 mg total) by mouth 2 (two) times daily.  Marland Kitchen atorvastatin (LIPITOR) 10 MG tablet Take 1 tablet (10 mg total) by mouth daily.  . B-D ULTRAFINE III SHORT PEN 31G X 8 MM MISC USE FOR INSULIN INJECTIONS TWICE DAILY  . Blood Glucose Monitoring Suppl (ACCU-CHEK GUIDE) w/Device KIT 1 Piece by Does not apply route as directed.  . furosemide (LASIX) 40 MG tablet Take  1/2 tablets daily (60 mg)   May take additional 20 mg daily for Shortness of breath  .  glipiZIDE (GLUCOTROL) 5 MG tablet Take 1 tablet (5 mg total) by mouth 2 (two) times daily before a meal.  . glucose blood test strip 1 each by Other route in the morning, at noon, in the evening, and at bedtime. Use as instructed to check blood sugar four times daily  . HYDROcodone-acetaminophen (NORCO/VICODIN) 5-325 MG tablet One tablet by mouth every six hours as needed for pain.  Marland Kitchen insulin aspart protamine - aspart (NOVOLOG 70/30 MIX) (70-30) 100 UNIT/ML FlexPen Inject 0.7-0.8 mLs (70-80 Units total) into the skin 2 (two) times daily with a meal. When glucose is above 90 and you are eating.  . Lancets (ONETOUCH DELICA PLUS [id number]) MISC USE TO TEST BLOOD SUGAR FOUR TIMES DAILY  . metFORMIN (GLUCOPHAGE) 500 MG tablet TAKE 1 TABLET(500 MG) BY MOUTH TWICE DAILY WITH A MEAL  . metoprolol succinate (TOPROL-XL) 100 MG 24 hr tablet Take 1&1/2 tablets (150 mg) by mouth daily. Take with or immediately following a meal.  . NORLYDA 0.35 MG tablet Take 1 tablet by mouth daily.   . sacubitril-valsartan (ENTRESTO) 97-103 MG Take 1 tablet by mouth 2 (two) times daily.  Marland Kitchen spironolactone (ALDACTONE) 25 MG tablet Take 1 tablet (25 mg total) by mouth daily.  Marland Kitchen tiZANidine (ZANAFLEX) 2 MG tablet Take 1 tablet (2 mg total) by mouth every 8 (eight) hours as needed for muscle spasms (caution causes drowsiness).  . [DISCONTINUED] atorvastatin (LIPITOR) 10 MG tablet Take 1 tablet (10 mg total) by mouth daily.   No facility-administered encounter medications on file  as of 11/24/2019.     Review of Systems  Constitutional: Negative for chills and fever.  HENT: Negative for congestion, rhinorrhea and sore throat.   Respiratory: Negative for cough, shortness of breath and wheezing.   Cardiovascular: Negative for chest pain and leg swelling.  Gastrointestinal: Negative for abdominal pain, diarrhea, nausea and vomiting.  Genitourinary: Negative for dysuria and frequency.  Musculoskeletal: Negative for arthralgias and  back pain.       +neck and rt arm pain-chronic  Skin: Negative for rash.  Neurological: Negative for dizziness, weakness and headaches.     Vitals BP 126/84   Pulse 97   Temp (!) 97 F (36.1 C) (Oral)   Ht 5' 7"  (1.702 m)   Wt 204 lb 12.8 oz (92.9 kg)   SpO2 100%   BMI 32.08 kg/m   Objective:   Physical Exam Vitals and nursing note reviewed.  Constitutional:      Appearance: Normal appearance.  HENT:     Head: Normocephalic and atraumatic.  Eyes:     Extraocular Movements: Extraocular movements intact.     Conjunctiva/sclera: Conjunctivae normal.     Pupils: Pupils are equal, round, and reactive to light.  Cardiovascular:     Rate and Rhythm: Normal rate and regular rhythm.     Pulses: Normal pulses.     Heart sounds: Normal heart sounds.  Pulmonary:     Effort: Pulmonary effort is normal.     Breath sounds: Normal breath sounds. No wheezing, rhonchi or rales.  Musculoskeletal:        General: Normal range of motion.     Right lower leg: No edema.     Left lower leg: No edema.  Skin:    General: Skin is warm and dry.     Findings: No lesion or rash.  Neurological:     General: No focal deficit present.     Mental Status: She is alert and oriented to person, place, and time.  Psychiatric:        Mood and Affect: Mood normal.        Behavior: Behavior normal.      Assessment and Plan   1. Essential hypertension, benign - CBC  2. Mixed hyperlipidemia - CBC - atorvastatin (LIPITOR) 10 MG tablet; Take 1 tablet (10 mg total) by mouth daily.  Dispense: 90 tablet; Refill: 1  3. Cervical stenosis of spinal canal  4. Herniated disc, cervical   Pt to f/u with NS for the surgery for her spinal canal stenosis and herniated disc in cervical region. Pt getting labs at end of sept for Dr. Dorris Fetch.  Added cbc for pt to get with those other labs. Pt going to call NS about her pain meds.  Not controlled with 1 tablet bid of norco 34m/325mg.  Advised pt to call back to  our office if not able to get her pain medication from the [facility] office.  HTN- controlled, cont meds.  Cont f/u with cards. F/u with cards for Dilated cardiomyopathy and chf.  HLD-stable.  Cont meds.  Recheck labs.  F/u [id number] prn.

## 2019-11-25 ENCOUNTER — Other Ambulatory Visit: Payer: Self-pay | Admitting: Neurosurgery

## 2019-11-25 ENCOUNTER — Telehealth: Payer: Self-pay | Admitting: *Deleted

## 2019-11-25 NOTE — Telephone Encounter (Signed)
Agree  Thank you

## 2019-11-25 NOTE — Telephone Encounter (Signed)
   [facility] Pre-operative Risk Assessment    HEARTCARE STAFF: - Please ensure there is not already an duplicate clearance open for this procedure. - Under Visit Info/Reason for Call, type in Other and utilize the format Clearance MM/DD/YY or Clearance TBD. Do not use dashes or single digits. - If request is for dental extraction, please clarify the # of teeth to be extracted.  Request for surgical clearance:  1. What type of surgery is being performed? CERVICAL FUSION   2. When is this surgery scheduled? TBD   3. What type of clearance is required (medical clearance vs. Pharmacy clearance to hold med vs. Both)? BOTH  4. Are there any medications that need to be held prior to surgery and how long? ELIQUIS   5. Practice name and name of physician performing surgery? [facility]; DR. Consuella Lose   6. What is the office phone number? [phone]   7.   What is the office fax number? [place]: [email or web address]  8.   Anesthesia type (None, local, MAC, general) ? GENERAL   Julaine Hua 11/25/2019, 9:54 AM  _________________________________________________________________   (provider comments below)

## 2019-11-25 NOTE — Telephone Encounter (Signed)
Pharmacy, can you please comment on how long Eliquis can be held for upcoming procedure?  Thank you! 

## 2019-11-25 NOTE — Telephone Encounter (Signed)
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  Procedure: cervical fusion Date of procedure: TBD  CrCl 72mL/min using adjusted body weight Platelet count 280K  Typically recommend holding Eliquis for 3 days prior to spinal procedure. Will defer to MD in setting of anticoag indication.

## 2019-11-26 NOTE — Telephone Encounter (Signed)
   Primary Cardiologist: Carlyle Dolly, MD  Chart reviewed as part of pre-operative protocol coverage. Patient was contacted 11/26/2019 in reference to pre-operative risk assessment for pending surgery as outlined below.  KENNEDEE KITZMILLER was last seen on 11/03/2019 by Dr. Haroldine Laws.  Since that day, ALESANDRA SMART has done well without significant orthopnea or PND. Recent echo showed stable EF.  Therefore, based on ACC/AHA guidelines, the patient would be at acceptable risk for the planned procedure without further cardiovascular testing.   I will route this recommendation to the requesting party via Epic fax function and remove from pre-op pool. Please call with questions.  She may hold Eliquis for 3 days prior to the surgery and restart as soon as possible after the procedure at the surgeon's discretion. Given her chronic LV dysfunction, we recommend soft hydration during surgery, avoid aggressive hydration which may result in pulmonary edema.    [place], [place] 11/26/2019, 8:13 PM

## 2019-11-26 NOTE — Telephone Encounter (Signed)
Pt can hold Eliquis for 3 days prior to spinal procedure per MD.

## 2019-11-29 ENCOUNTER — Other Ambulatory Visit: Payer: Self-pay | Admitting: Neurosurgery

## 2019-11-29 ENCOUNTER — Telehealth: Payer: Self-pay | Admitting: Family Medicine

## 2019-11-29 NOTE — Telephone Encounter (Signed)
Per Dr.Taylor; follow up needs to be with cardiologist.

## 2019-11-29 NOTE — Telephone Encounter (Signed)
Discussed with pt and pre op clearance form faxed to dr branch. Fax number [phone].

## 2019-11-29 NOTE — Progress Notes (Signed)
[id number] - [place] [place] [address] Phone: [phone] Fax: [phone]      Your procedure is scheduled on Friday 12/03/2019.  Report to [facility] Main Entrance "A" at 11:15 A.M., and check in at the Admitting office.  Call this number if you have problems the morning of surgery:  [phone]  Call [phone] if you have any questions prior to your surgery date Monday-Friday 8am-4pm    Remember:  Do not eat or drink after midnight the night before your surgery     Take these medicines the morning of surgery with A SIP OF : Atorvastatin (Lipitor) Hydrocodone-acetaminophen (Norco/vicodin) Metoprolol succinate (Torpol-XL)   As of today, STOP taking any Aspirin (unless otherwise instructed by your surgeon) Aleve, Naproxen, Ibuprofen, Motrin, Advil, Goody's, BC's, all herbal medications, fish oil, and all vitamins.  Per your doctor's instructions, hold apixaban (Eliquis) 3 days prior to surgery.   WHAT DO I DO ABOUT MY DIABETES MEDICATION?   Marland Kitchen Do not take oral diabetes medicines (pills) the morning of surgery. - Do not take Metformin (Glucophage) or Glipizide (Glucotrol) the morning of srugery . Do not take an evening dose of Glipizide (Glucotrol) the evening before surgery.  . THE NIGHT BEFORE SURGERY, take 56 units of Novolog 70/30 insulin (70% of your usual dose).       . THE MORNING OF SURGERY, do not take any insulin.  . The day of surgery, do not take other diabetes injectables, including Byetta (exenatide), Bydureon (exenatide ER), Victoza (liraglutide), or Trulicity (dulaglutide).  . If your CBG is greater than 220 mg/dL, you may take  of your sliding scale (correction) dose of insulin.   HOW TO MANAGE YOUR DIABETES BEFORE AND AFTER SURGERY  Why is it important to control my blood sugar before and after surgery? . Improving blood sugar levels before  and after surgery helps healing and can limit problems. . A way of improving blood sugar control is eating a healthy diet by: o  Eating less sugar and carbohydrates o  Increasing activity/exercise o  Talking with your doctor about reaching your blood sugar goals . High blood sugars (greater than 180 mg/dL) can raise your risk of infections and slow your recovery, so you will need to focus on controlling your diabetes during the weeks before surgery. . Make sure that the doctor who takes care of your diabetes knows about your planned surgery including the date and location.  How do I manage my blood sugar before surgery? . Check your blood sugar at least 4 times a day, starting 2 days before surgery, to make sure that the level is not too high or low. . Check your blood sugar the morning of your surgery when you wake up and every 2 hours until you get to the Short Stay unit. o If your blood sugar is less than 70 mg/dL, you will need to treat for low blood sugar: - Do not take insulin. - Treat a low blood sugar (less than 70 mg/dL) with  cup of clear juice (cranberry or apple), 4 glucose tablets, OR glucose gel. - Recheck blood sugar in 15 minutes after treatment (to make sure it is greater than 70 mg/dL). If your blood sugar is not greater than 70 mg/dL on recheck, call [phone] for further instructions. . Report your blood sugar to the short stay nurse when you get to  Short Stay.  . If you are admitted to the hospital after surgery: o Your blood sugar will be checked by the staff and you will probably be given insulin after surgery (instead of oral diabetes medicines) to make sure you have good blood sugar levels. o The goal for blood sugar control after surgery is 80-180 mg/dL.                       Do not wear jewelry, make up, or nail polish            Do not wear lotions, powders, perfumes, or deodorant.            Do not shave 48 hours prior to surgery.              Do not bring  valuables to the hospital.            [facility] is not responsible for any belongings or valuables.  Do NOT Smoke (Tobacco/Vaping) or drink Alcohol 24 hours prior to your procedure  If you use a CPAP at night, you may bring all equipment for your overnight stay.   Contacts, glasses, dentures or bridgework may not be worn into surgery.      For patients admitted to the hospital, discharge time will be determined by your treatment team.   Patients discharged the day of surgery will not be allowed to drive home, and someone needs to stay with them for 24 hours.    Special instructions:   [facility]- Preparing For Surgery  Before surgery, you can play an important role. Because skin is not sterile, your skin needs to be as free of germs as possible. You can reduce the number of germs on your skin by washing with CHG (chlorahexidine gluconate) Soap before surgery.  CHG is an antiseptic cleaner which kills germs and bonds with the skin to continue killing germs even after washing.    Oral Hygiene is also important to reduce your risk of infection.  Remember - BRUSH YOUR TEETH THE MORNING OF SURGERY WITH YOUR REGULAR TOOTHPASTE  Please do not use if you have an allergy to CHG or antibacterial soaps. If your skin becomes reddened/irritated stop using the CHG.  Do not shave (including legs and underarms) for at least 48 hours prior to first CHG shower. It is OK to shave your face.  Please follow these instructions carefully.   1. Shower the NIGHT BEFORE SURGERY and the MORNING OF SURGERY with CHG Soap.   2. If you chose to wash your hair, wash your hair first as usual with your normal shampoo.  3. After you shampoo, rinse your hair and body thoroughly to remove the shampoo.  4. Use CHG as you would any other liquid soap. You can apply CHG directly to the skin and wash gently with a scrungie or a clean washcloth.   5. Apply the CHG Soap to your body ONLY FROM THE NECK DOWN.  Do not use on  open wounds or open sores. Avoid contact with your eyes, ears, mouth and genitals (private parts). Wash Face and genitals (private parts)  with your normal soap.   6. Wash thoroughly, paying special attention to the area where your surgery will be performed.  7. Thoroughly rinse your body with warm water from the neck down.  8. DO NOT shower/wash with your normal soap after using and rinsing off the CHG Soap.  9. Pat yourself dry with a  CLEAN TOWEL.  10. Wear CLEAN PAJAMAS to bed the night before surgery  11. Place CLEAN SHEETS on your bed the night of your first shower and DO NOT SLEEP WITH PETS.   Day of Surgery: Wear Clean/Comfortable clothing the morning of surgery Do not apply any deodorants/lotions.   Remember to brush your teeth WITH YOUR REGULAR TOOTHPASTE.   Please read over the following fact sheets that you were given.

## 2019-11-29 NOTE — Telephone Encounter (Signed)
Per Dr.Taylor. Pt needs appointment for pre op clearance. Left message to return call. Form at nurses station. Pt will also need labs which will be discussed at pre op appt.

## 2019-11-30 ENCOUNTER — Other Ambulatory Visit (HOSPITAL_COMMUNITY)
Admission: RE | Admit: 2019-11-30 | Discharge: 2019-11-30 | Disposition: A | Discharge status: Home / Self Care | Payer: 59 | Source: Ambulatory Visit | Attending: Neurosurgery | Admitting: Neurosurgery

## 2019-11-30 ENCOUNTER — Other Ambulatory Visit (HOSPITAL_COMMUNITY): Payer: 59

## 2019-11-30 ENCOUNTER — Encounter (HOSPITAL_COMMUNITY)
Admission: RE | Admit: 2019-11-30 | Discharge: 2019-11-30 | Disposition: A | Discharge status: Home / Self Care | Payer: 59 | Source: Ambulatory Visit | Attending: Neurosurgery | Admitting: Neurosurgery

## 2019-11-30 ENCOUNTER — Encounter (HOSPITAL_COMMUNITY): Payer: Self-pay

## 2019-11-30 ENCOUNTER — Other Ambulatory Visit: Payer: Self-pay

## 2019-11-30 DIAGNOSIS — Z01812 Encounter for preprocedural laboratory examination: Secondary | ICD-10-CM | POA: Insufficient documentation

## 2019-11-30 DIAGNOSIS — Z20822 Contact with and (suspected) exposure to covid-19: Secondary | ICD-10-CM | POA: Insufficient documentation

## 2019-11-30 HISTORY — DX: Presence of automatic (implantable) cardiac defibrillator: Z95.810

## 2019-11-30 HISTORY — DX: Unspecified osteoarthritis, unspecified site: M19.90

## 2019-11-30 HISTORY — DX: Sleep apnea, unspecified: G47.30

## 2019-11-30 HISTORY — DX: Gastro-esophageal reflux disease without esophagitis: K21.9

## 2019-11-30 LAB — SURGICAL PCR SCREEN
MRSA, PCR: NEGATIVE
Staphylococcus aureus: NEGATIVE

## 2019-11-30 LAB — BASIC METABOLIC PANEL
Anion gap: 10 (ref 5–15)
BUN: 15 mg/dL (ref 6–20)
CO2: 25 mmol/L (ref 22–32)
Calcium: 9.3 mg/dL (ref 8.9–10.3)
Chloride: 103 mmol/L (ref 98–111)
Creatinine, Ser: 0.73 mg/dL (ref 0.44–1.00)
GFR calc Af Amer: >60 mL/min (ref >60)
GFR calc non Af Amer: >60 mL/min (ref >60)
Glucose, Bld: 120 mg/dL — ABNORMAL HIGH (ref 70–99)
Potassium: 4 mmol/L (ref 3.5–5.1)
Sodium: 138 mmol/L (ref 135–145)

## 2019-11-30 LAB — GLUCOSE, CAPILLARY: Glucose-Capillary: 133 mg/dL — ABNORMAL HIGH (ref 70–99)

## 2019-11-30 LAB — TYPE AND SCREEN
ABO/RH(D): A POS
Antibody Screen: NEGATIVE

## 2019-11-30 LAB — CBC
HCT: 39.3 % (ref 36.0–46.0)
Hemoglobin: 12.7 g/dL (ref 12.0–15.0)
MCH: 31.1 pg (ref 26.0–34.0)
MCHC: 32.3 g/dL (ref 30.0–36.0)
MCV: 96.3 fL (ref 80.0–100.0)
Platelets: 229 10*3/uL (ref 150–400)
RBC: 4.08 MIL/uL (ref 3.87–5.11)
RDW: 12.2 % (ref 11.5–15.5)
WBC: 7.9 10*3/uL (ref 4.0–10.5)
nRBC: 0 % (ref 0.0–0.2)

## 2019-11-30 NOTE — Progress Notes (Unsigned)
PERIOPERATIVE PRESCRIPTION FOR IMPLANTED CARDIAC DEVICE PROGRAMMING  Patient Information: Name:  Tara Bryant  DOB:  [birth year]  MRN:  [id number]  {TIP - You do not have to delete this tip  -  Copy the info from the staff message sent by the PAT staff  then press F2 here and paste the information using CTL - V on the next line :[id number]}  Surgeon: Dr. Consuella Lose  Date of Procedure: Friday 12/03/2019  Cautery will be used.  Position during surgery: supine   Please send documentation back to:  [facility] (Fax # [phone])   Device Information:  Clinic EP Physician:  Cristopher Peru, MD   Device Type:  Defibrillator Manufacturer and Phone #:  [employer]: [phone] Pacemaker Dependent?:  No. Date of Last Device Check:  11/05/19 Normal Device Function?:  Yes.    Electrophysiologist's Recommendations:   Have magnet available.  Provide continuous ECG monitoring when magnet is used or reprogramming is to be performed.   Procedure will likely interfere with device function.  Device should be programmed:  Tachy therapies disabled   Discussed with [place], he will be there to turn device off prior to surgery.   Per Device Clinic Standing Orders, Simone Curia, RN  10:40 AM 11/30/2019

## 2019-11-30 NOTE — Progress Notes (Addendum)
PCP - Joaquin Music @ [facility] Cardiologist - Carlyle Dolly,  Heart Failure clinic: [facility] Electrophysiology: greg taylor  ICD - yes Device Orders - faxed Rep Notified - [place]  From [employer] notified of surgery date and time. Stated if he is needed to call him 1 hour prior to surgery. [phone]   Chest x-ray - na EKG - 02/04/19 Stress Test - na ECHO - 11/16/19 Cardiac Cath -  03/24/18 Sleep Study -  CPAP -   Fasting Blood Sugar - 200-220 Checks Blood Sugar ___2__ times a day  Blood Thinner Instructions: last dose eliquis 11/29/19 Aspirin Instructions:na    COVID TEST-  11/30/19   Anesthesia review: cardiac hx.   Patient denies shortness of breath, fever, cough and chest pain at PAT appointment   All instructions explained to the patient, with a verbal understanding of the material. Patient agrees to go over the instructions while at home for a better understanding. Patient also instructed to self quarantine after being tested for COVID-19. The opportunity to ask questions was provided.

## 2019-12-01 ENCOUNTER — Other Ambulatory Visit: Payer: Self-pay | Admitting: "Endocrinology

## 2019-12-01 LAB — HEMOGLOBIN A1C
Hgb A1c MFr Bld: 7.7 % — ABNORMAL HIGH (ref 4.8–5.6)
Mean Plasma Glucose: 174 mg/dL

## 2019-12-01 LAB — SARS CORONAVIRUS 2 (TAT 6-24 HRS): SARS Coronavirus 2: NEGATIVE

## 2019-12-01 NOTE — Progress Notes (Signed)
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 Preop clearance per telephone encounter 11/26/2019, "Chart reviewed as part of pre-operative protocol coverage. Patient was contacted 11/26/2019 in reference to pre-operative risk assessment for pending surgery as outlined below.  Tara Bryant was last seen on 11/03/2019 by Dr. Haroldine Laws.  Since that day, Tara Bryant has done well without significant orthopnea or PND. Recent echo showed stable EF. Therefore, based on ACC/AHA guidelines, the patient would be at acceptable risk for the planned procedure without further cardiovascular testing.   She may hold Eliquis for 3 days prior to the surgery and restart as soon as possible after the procedure at the surgeon's discretion. Given her chronic LV dysfunction, we recommend soft hydration during surgery, avoid aggressive hydration which may result in pulmonary edema."  Moderate OSA by sleep study 02/2019. Has been working to get CPAP per notes.   IDDM 2 not well controlled preop A1c 7.7.  Labs otherwise unremarkable.  EKG 02/04/2019: Sinus rhythm with occasional premature ventricular complexes.  T wave abnormality, consider inferolateral ischemia.  Perioperative device instructions: Device Information:  Clinic EP Physician:  Cristopher Peru, MD   Device Type:  Defibrillator Manufacturer and Phone #:  [employer]: [phone] Pacemaker Dependent?:  No. Date of Last Device Check:  11/05/19           Normal Device Function?:  Yes.    Electrophysiologist's Recommendations:   Have magnet available.  Provide continuous ECG monitoring when magnet is used or reprogramming is to be performed.   Procedure will likely interfere with device function.  Device should be programmed:  Tachy therapies  disabled   Discussed with [place], he will be there to turn device off prior to surgery.    TTE 11/16/2019: 1. EF similar to that seen on echo done May 2020 . Left ventricular  ejection fraction, by estimation, is 25 to 30%. The left ventricle has  severely decreased function. The left ventricle demonstrates global  hypokinesis. The left ventricular internal  cavity size was moderately dilated. There is mild asymmetric left  ventricular hypertrophy of the basal and septal segments. Left ventricular  diastolic parameters were normal.  2. AICD wires in RA/RV. Right ventricular systolic function is normal.  The right ventricular size is normal. There is moderately elevated  pulmonary artery systolic pressure.  3. Left atrial size was mildly dilated.  4. The mitral valve is normal in structure. Mild mitral valve  regurgitation. No evidence of mitral stenosis.  5. Tricuspid valve regurgitation is mild to moderate.  6. The aortic valve is normal in structure. Aortic valve regurgitation is  not visualized. No aortic stenosis is present.  7. The inferior vena cava is normal in size with greater than 50%  respiratory variability, suggesting right atrial pressure of 3 mmHg.   Right/left heart cath 03/24/2018:  Prox LAD lesion is 55% stenosed. DFR 0.92  Otherwise angiographically normal coronary arteries.  Hemodynamic findings consistent with severe pulmonary hypertension.  LV end diastolic pressure is severely elevated.   SUMMARY:  Moderate single-vessel disease with roughly 60% mid LAD (DFR 0.92 -not physiologically significant)  Otherwise minimal CAD.  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  (Output 4.48, Index 2.2.)  Wynonia Musty [facility] Short Stay Center/Anesthesiology Phone  [phone] 12/02/2019 11:19 AM

## 2019-12-02 ENCOUNTER — Encounter: Payer: 59 | Admitting: Internal Medicine

## 2019-12-02 NOTE — Anesthesia Preprocedure Evaluation (Addendum)
Anesthesia Evaluation  Patient identified by MRN, date of birth, ID band Patient awake    Reviewed: Allergy & Precautions, NPO status , Patient's Chart, lab work & pertinent test results  Airway Mallampati: III  TM Distance: >3 FB Neck ROM: Full    Dental no notable dental hx. (+) Chipped, Dental Advisory Given,    Pulmonary sleep apnea (moderate, not using CPAP) ,    Pulmonary exam normal breath sounds clear to auscultation       Cardiovascular hypertension, Pt. on medications + CAD and +CHF  Normal cardiovascular exam+ Cardiac Defibrillator  Rhythm:Regular Rate:Normal  TTE 11/16/2019: 1. EF similar to that seen on echo done May 2020 . Left ventricular ejection fraction, by estimation, is 25 to 30%. The left ventricle has severely decreased function. The left ventricle demonstrates global hypokinesis. The left ventricular internal cavity size was moderately dilated. There is mild asymmetric left ventricular hypertrophy of the basal and septal segments. Left ventricular  diastolic parameters were normal.  2. AICD wires in RA/RV. Right ventricular systolic function is normal. The right ventricular size is normal. There is moderately elevated pulmonary artery systolic pressure.  3. Left atrial size was mildly dilated.  4. The mitral valve is normal in structure. Mild mitral valve  regurgitation. No evidence of mitral stenosis.  5. Tricuspid valve regurgitation is mild to moderate.  6. The aortic valve is normal in structure. Aortic valve regurgitation is not visualized. No aortic stenosis is present.  7. The inferior vena cava is normal in size with greater than 50% respiratory variability, suggesting right atrial pressure of 3 mmHg.   Right/left heart cath 03/24/2018: Prox LAD lesion is 55% stenosed. DFR 0.92 Otherwise angiographically normal coronary arteries. Hemodynamic findings consistent with severe pulmonary  hypertension. LV end diastolic pressure is severely elevated. SUMMARY: Moderate single-vessel disease with roughly 60% mid LAD (DFR 0.92 -not physiologically significant) Otherwise minimal CAD. 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 (Output 4.48, Index 2.2.)   Neuro/Psych PSYCHIATRIC DISORDERS Depression negative neurological ROS     GI/Hepatic Neg liver ROS, GERD  ,  Endo/Other  diabetes (A1C 7.7), Type 2, Insulin Dependent, Oral Hypoglycemic Agents  Renal/GU negative Renal ROS  negative genitourinary   Musculoskeletal  (+) Arthritis ,   Abdominal   Peds  Hematology  (+) Blood dyscrasia (on eliquis), ,   Anesthesia Other Findings   Reproductive/Obstetrics                           Anesthesia Physical Anesthesia Plan  ASA: IV  Anesthesia Plan: General   Post-op Pain Management:    Induction: Intravenous  PONV Risk Score and Plan: 3 and Midazolam, Dexamethasone and Ondansetron  Airway Management Planned: Oral ETT and Video Laryngoscope Planned  Additional Equipment: Arterial line  Intra-op Plan:   Post-operative Plan: Extubation in OR  Informed Consent: I have reviewed the patients History and Physical, chart, labs and discussed the procedure including the risks, benefits and alternatives for the proposed anesthesia with the patient or authorized representative who has indicated his/her understanding and acceptance.     Dental advisory given  Plan Discussed with: CRNA  Anesthesia Plan Comments: (2 IVs. Plan to have EP reprogram AICD. )      Anesthesia Quick Evaluation

## 2019-12-02 NOTE — H&P (Signed)
Chief Complaint   Neck pain, arm pain  HPI   HPI: Tara Bryant is a 49 y.o. femaleWho was found to have a large right eccentric disc herniation at C6-7 during workup for progressively worsening right-sided neck and arm pain.  She has failed reasonable conservative treatment including p.o. medications and physical therapy.  She presents today for anterior cervical decompression fusion at C6-7.  She is without any concerns.  Of note, she does have a history of combined systolic and diastolic heart failure and is seen in the heart failure clinic by Dr. Tempie Hoist.  She is currently on contrast so, has an implanted AICD, and is also on Eliquis.  She does also have a somewhat poorly controlled type 2 diabetes.  She reports her last hemoglobin A1c was around 7, and daily blood sugars range around 200.   We have received medical clearance from Cardiology.  She has been off Eliquis since Monday in preparation for surgery today.   Patient Active Problem List   Diagnosis Date Noted  . OSA (obstructive sleep apnea) 09/30/2019  . Chronic systolic heart failure ([place]) 02/04/2019  . ICD (implantable cardioverter-defibrillator) in place 02/04/2019  . Uncontrolled type 2 diabetes mellitus with hyperglycemia ([place]) 06/10/2018  . Mixed hyperlipidemia 06/10/2018  . Family history of left ventricular noncompaction   . Dilated cardiomyopathy ([place]): EF 20%, global hypokinesis. 03/23/2018  . Acute combined systolic and diastolic heart failure ([place]) 03/23/2018  . Hirsutism 11/04/2016  . Perimenopause 07/28/2015  . Irritable bowel syndrome 09/01/2014  . Gastritis 05/11/2014  . Hypertriglyceridemia, essential 03/08/2014  . Type 2 diabetes mellitus without complication, with long-term current use of insulin ([place]) 06/29/2013  . Pain in joint, lower leg 12/01/2012  . Venous stasis of lower extremity 12/01/2012  . Depression 12/01/2012  . Essential hypertension, benign 07/30/2012    PMH: Past Medical  History:  Diagnosis Date  . AICD (automatic cardioverter/defibrillator) present   . Arrhythmia   . Arthritis   . CAD (coronary artery disease)    a. 03/2018: cath showing 60% mid-LAD stenosis not significant by FFR.   Marland Kitchen Chronic combined systolic (congestive) and diastolic (congestive) heart failure ([place])    a. 03/2018: found to have a newly reduced EF of 20% by echocardiogram  . Depression    denies  . Diabetes mellitus   . GERD (gastroesophageal reflux disease)    h/o of  . Hypertension   . Microproteinuria   . S/P thyroid biopsy 2020   follow up once a year,   . Sleep apnea     PSH: Past Surgical History:  Procedure Laterality Date  . BACK SURGERY  2010  . BREAST BIOPSY Left 2017   benign  . CARDIAC CATHETERIZATION    . ICD IMPLANT N/A 10/29/2018   Procedure: ICD IMPLANT;  Surgeon: Evans Lance, MD;  Location: [facility];  Service: Cardiovascular;  Laterality: N/A;  . INTRAVASCULAR PRESSURE WIRE/FFR STUDY N/A 03/24/2018   Procedure: INTRAVASCULAR PRESSURE WIRE/FFR STUDY;  Surgeon: Leonie Man, MD;  Location: [facility];  Service: Cardiovascular;  Laterality: N/A;  . NOSE SURGERY    . RIGHT/LEFT HEART CATH AND CORONARY ANGIOGRAPHY N/A 03/24/2018   Procedure: RIGHT/LEFT HEART CATH AND CORONARY ANGIOGRAPHY;  Surgeon: Leonie Man, MD;  Location: [facility];  Service: Cardiovascular;  Laterality: N/A;  . TONSILLECTOMY    . WISDOM TOOTH EXTRACTION      No medications prior to admission.    SH: Social History  Tobacco Use  . Smoking status: Never Smoker  . Smokeless tobacco: Never Used  Vaping Use  . Vaping Use: Never used  Substance Use Topics  . Alcohol use: No  . Drug use: No    MEDS: Prior to Admission medications   Medication Sig Start Date End Date Taking? Authorizing Provider  apixaban (ELIQUIS) 5 MG TABS tablet Take 1 tablet (5 mg total) by mouth 2 (two) times daily. 02/25/19  Yes BranchAlphonse Guild, MD  atorvastatin  (LIPITOR) 10 MG tablet Take 1 tablet (10 mg total) by mouth daily. 11/24/19  Yes Lovena Le, Malena M, DO  furosemide (LASIX) 40 MG tablet Take  1/2 tablets daily (60 mg)   May take additional 20 mg daily for Shortness of breath Patient taking differently: Take 60 mg by mouth daily. May take additional 20 mg daily for Shortness of breath 09/07/19  Yes Branch, Alphonse Guild, MD  HYDROcodone-acetaminophen (NORCO/VICODIN) 5-325 MG tablet One tablet by mouth every six hours as needed for pain. Patient taking differently: Take 1 tablet by mouth every 6 (six) hours as needed (pain.).  11/18/19  Yes Sanjuana Kava, MD  insulin aspart protamine - aspart (NOVOLOG 70/30 MIX) (70-30) 100 UNIT/ML FlexPen Inject 0.7-0.8 mLs (70-80 Units total) into the skin 2 (two) times daily with a meal. When glucose is above 90 and you are eating. Patient taking differently: Inject 70-80 Units into the skin See admin instructions. Inject 70 units subcutaneously in the morning & inject 80 units subcutaneously in the evening. 10/25/19  Yes Nida, Marella Chimes, MD  metFORMIN (GLUCOPHAGE) 500 MG tablet TAKE 1 TABLET(500 MG) BY MOUTH TWICE DAILY WITH A MEAL Patient taking differently: Take 500 mg by mouth in the morning and at bedtime.  06/02/19  Yes Nida, Marella Chimes, MD  metoprolol succinate (TOPROL-XL) 100 MG 24 hr tablet Take 1&1/2 tablets (150 mg) by mouth daily. Take with or immediately following a meal. Patient taking differently: Take 150 mg by mouth daily. Take 1&1/2 tablets (150 mg) by mouth daily. Take with or immediately following a meal. 04/30/19  Yes Branch, Alphonse Guild, MD  NORLYDA 0.35 MG tablet Take 1 tablet by mouth at bedtime.  09/18/16  Yes [provider]  sacubitril-valsartan (ENTRESTO) 97-103 MG Take 1 tablet by mouth 2 (two) times daily. 12/30/18  Yes Bensimhon, Shaune Pascal, MD  spironolactone (ALDACTONE) 25 MG tablet Take 1 tablet (25 mg total) by mouth daily. 11/03/19  Yes Bensimhon, Shaune Pascal, MD  trolamine  salicylate (ASPERCREME) 10 % cream Apply 1 application topically 4 (four) times daily as needed for muscle pain.   Yes [provider]  B-D ULTRAFINE III SHORT PEN 31G X 8 MM MISC USE FOR INSULIN INJECTIONS TWICE DAILY 11/24/19   Cassandria Anger, MD  Blood Glucose Monitoring Suppl (ACCU-CHEK GUIDE) w/Device KIT 1 Piece by Does not apply route as directed. 02/12/19   Cassandria Anger, MD  glipiZIDE (GLUCOTROL) 5 MG tablet TAKE 1 TABLET(5 MG) BY MOUTH TWICE DAILY BEFORE A MEAL 12/02/19   Nida, Marella Chimes, MD  glucose blood test strip 1 each by Other route in the morning, at noon, in the evening, and at bedtime. Use as instructed to check blood sugar four times daily 05/13/19   Cassandria Anger, MD  Lancets [facility] DELICA PLUS [id number]) MISC USE TO TEST BLOOD SUGAR FOUR TIMES DAILY 05/14/19   [provider]  tiZANidine (ZANAFLEX) 2 MG tablet Take 1 tablet (2 mg total) by mouth every 8 (eight) hours  as needed for muscle spasms (caution causes drowsiness). 09/15/19   Elvia Collum M, DO    ALLERGY: Allergies  Allergen Reactions  . Aspartame And Phenylalanine Dermatitis    Social History   Tobacco Use  . Smoking status: Never Smoker  . Smokeless tobacco: Never Used  Substance Use Topics  . Alcohol use: No     Family History  Problem Relation Age of Onset  . Stroke Sister 83       smoker, diabetic, HTN  . Breast cancer Sister   . Cancer Mother        breast  . Breast cancer Mother   . Hypertension Mother   . Diabetes Other   . Heart attack Other   . Chronic Renal Failure Father      ROS   ROS  Exam   There were no vitals filed for this visit. General appearance: WDWN, NAD Eyes: No scleral injection Cardiovascular: Regular rate and rhythm without murmurs, rubs, gallops. No edema or variciosities. Distal pulses normal. Pulmonary: Effort normal, non-labored breathing Musculoskeletal:     Muscle tone upper extremities: Normal    Muscle tone  lower extremities: Normal    Motor exam: Upper Extremities Deltoid Bicep Tricep Grip  Right 5/5 5/5 5/5 5/5  Left 5/5 5/5 5/5 5/5   Lower Extremity IP Quad PF DF EHL  Right 5/5 5/5 5/5 5/5 5/5  Left 5/5 5/5 5/5 5/5 5/5   Neurological Mental Status:    - Patient is awake, alert, oriented to person, place, month, year, and situation    - Patient is able to give a clear and coherent history.    - No signs of aphasia or neglect Cranial Nerves    - II: Visual Fields are full. PERRL    - III/IV/VI: EOMI without ptosis or diploplia.     - V: Facial sensation is grossly normal    - VII: Facial movement is symmetric.     - VIII: hearing is intact to voice    - X: Uvula elevates symmetrically    - XI: Shoulder shrug is symmetric.    - XII: tongue is midline without atrophy or fasciculations.  Sensory: Sensation grossly intact to LT  Results - Imaging/Labs   Results for orders placed or performed during the hospital encounter of 11/30/19 (from the past 48 hour(s))  SARS CORONAVIRUS 2 (TAT 6-24 HRS) Nasopharyngeal Nasopharyngeal Swab     Status: None   Collection Time: 11/30/19  2:28 PM   Specimen: Nasopharyngeal Swab  Result Value Ref Range   SARS Coronavirus 2 NEGATIVE NEGATIVE    Comment: (NOTE) SARS-CoV-2 target nucleic acids are NOT DETECTED.  The SARS-CoV-2 RNA is generally detectable in upper and lower respiratory specimens during the acute phase of infection. Negative results do not preclude SARS-CoV-2 infection, do not rule out co-infections with other pathogens, and should not be used as the sole basis for treatment or other patient management decisions. Negative results must be combined with clinical observations, patient history, and epidemiological information. The expected result is Negative.  Fact Sheet for Patients: SugarRoll.be  Fact Sheet for Healthcare Providers: https://www.woods-mathews.com/  This test is not yet  approved or cleared by the [place] FDA and  has been authorized for detection and/or diagnosis of SARS-CoV-2 by FDA under an Emergency Use Authorization (EUA). This EUA will remain  in effect (meaning this test can be used) for the duration of the COVID-19 declaration under Se ction 564(b)(1) of the Act, 21 U.S.C.  section 360bbb-3(b)(1), unless the authorization is terminated or revoked sooner.  Performed at [facility], [place] [address], [place], [place] [address]     No results found.  IMAGING: MRI of the cervical spine dated 11/15/2019 was personally reviewed.  There is straightening of the normal cervical lordosis.  Primary findings at C6-7 where there is a relatively large right eccentric disc herniation with resultant severe right-sided foraminal stenosis and compression of the exiting right C7 nerve root.  Impression/Plan   49 y.o. female with progressively worsening right-sided neck and arm pain related to large right eccentric disc herniation at C6-7.  She has failed reasonable medical treatment as well as a course of occupational therapy.   We will proceed with anterior cervical decompression fusion at C6-7.  We have received cardiac clearance.  We have reviewed the indications for surgery, the associated risks, benefits and alternatives at length in the office.  All questions today were answered and consent was obtained.  Consuella Lose, MD [facility]

## 2019-12-03 ENCOUNTER — Encounter (HOSPITAL_COMMUNITY): Payer: Self-pay | Admitting: Neurosurgery

## 2019-12-03 ENCOUNTER — Ambulatory Visit (HOSPITAL_COMMUNITY): Payer: 59 | Admitting: Anesthesiology

## 2019-12-03 ENCOUNTER — Ambulatory Visit (HOSPITAL_COMMUNITY): Payer: 59 | Admitting: Physician Assistant

## 2019-12-03 ENCOUNTER — Other Ambulatory Visit: Payer: Self-pay

## 2019-12-03 ENCOUNTER — Inpatient Hospital Stay (HOSPITAL_COMMUNITY)
Admission: RE | Admit: 2019-12-03 | Discharge: 2019-12-04 | DRG: 472 | Disposition: A | Discharge status: Home / Self Care | Payer: 59 | Attending: Neurosurgery | Admitting: Neurosurgery

## 2019-12-03 ENCOUNTER — Ambulatory Visit (HOSPITAL_COMMUNITY): Payer: 59

## 2019-12-03 ENCOUNTER — Encounter (HOSPITAL_COMMUNITY)
Admission: RE | Disposition: A | Discharge status: Home / Self Care | Payer: Self-pay | Source: Home / Self Care | Attending: Neurosurgery

## 2019-12-03 DIAGNOSIS — Z7901 Long term (current) use of anticoagulants: Secondary | ICD-10-CM

## 2019-12-03 DIAGNOSIS — K589 Irritable bowel syndrome without diarrhea: Secondary | ICD-10-CM | POA: Diagnosis present

## 2019-12-03 DIAGNOSIS — L680 Hirsutism: Secondary | ICD-10-CM | POA: Diagnosis present

## 2019-12-03 DIAGNOSIS — Z419 Encounter for procedure for purposes other than remedying health state, unspecified: Secondary | ICD-10-CM

## 2019-12-03 DIAGNOSIS — M4722 Other spondylosis with radiculopathy, cervical region: Secondary | ICD-10-CM | POA: Diagnosis not present

## 2019-12-03 DIAGNOSIS — M1990 Unspecified osteoarthritis, unspecified site: Secondary | ICD-10-CM | POA: Diagnosis present

## 2019-12-03 DIAGNOSIS — M542 Cervicalgia: Secondary | ICD-10-CM | POA: Diagnosis present

## 2019-12-03 DIAGNOSIS — Z20822 Contact with and (suspected) exposure to covid-19: Secondary | ICD-10-CM | POA: Diagnosis present

## 2019-12-03 DIAGNOSIS — Z794 Long term (current) use of insulin: Secondary | ICD-10-CM | POA: Diagnosis not present

## 2019-12-03 DIAGNOSIS — I420 Dilated cardiomyopathy: Secondary | ICD-10-CM | POA: Diagnosis present

## 2019-12-03 DIAGNOSIS — I2510 Atherosclerotic heart disease of native coronary artery without angina pectoris: Secondary | ICD-10-CM | POA: Diagnosis present

## 2019-12-03 DIAGNOSIS — Z79899 Other long term (current) drug therapy: Secondary | ICD-10-CM | POA: Diagnosis not present

## 2019-12-03 DIAGNOSIS — Z888 Allergy status to other drugs, medicaments and biological substances status: Secondary | ICD-10-CM | POA: Diagnosis not present

## 2019-12-03 DIAGNOSIS — G4733 Obstructive sleep apnea (adult) (pediatric): Secondary | ICD-10-CM | POA: Diagnosis not present

## 2019-12-03 DIAGNOSIS — I5042 Chronic combined systolic (congestive) and diastolic (congestive) heart failure: Secondary | ICD-10-CM | POA: Diagnosis present

## 2019-12-03 DIAGNOSIS — Z9102 Food additives allergy status: Secondary | ICD-10-CM | POA: Diagnosis not present

## 2019-12-03 DIAGNOSIS — M5412 Radiculopathy, cervical region: Secondary | ICD-10-CM | POA: Diagnosis present

## 2019-12-03 DIAGNOSIS — E782 Mixed hyperlipidemia: Secondary | ICD-10-CM | POA: Diagnosis present

## 2019-12-03 DIAGNOSIS — F329 Major depressive disorder, single episode, unspecified: Secondary | ICD-10-CM | POA: Diagnosis not present

## 2019-12-03 DIAGNOSIS — M50123 Cervical disc disorder at C6-C7 level with radiculopathy: Secondary | ICD-10-CM | POA: Diagnosis present

## 2019-12-03 DIAGNOSIS — I110 Hypertensive heart disease with heart failure: Secondary | ICD-10-CM | POA: Diagnosis not present

## 2019-12-03 DIAGNOSIS — Z8249 Family history of ischemic heart disease and other diseases of the circulatory system: Secondary | ICD-10-CM

## 2019-12-03 DIAGNOSIS — Z833 Family history of diabetes mellitus: Secondary | ICD-10-CM | POA: Diagnosis not present

## 2019-12-03 DIAGNOSIS — E1165 Type 2 diabetes mellitus with hyperglycemia: Secondary | ICD-10-CM | POA: Diagnosis present

## 2019-12-03 DIAGNOSIS — K219 Gastro-esophageal reflux disease without esophagitis: Secondary | ICD-10-CM | POA: Diagnosis present

## 2019-12-03 DIAGNOSIS — Z95810 Presence of automatic (implantable) cardiac defibrillator: Secondary | ICD-10-CM | POA: Diagnosis not present

## 2019-12-03 HISTORY — PX: ANTERIOR CERVICAL DECOMP/DISCECTOMY FUSION: SHX1161

## 2019-12-03 LAB — POCT PREGNANCY, URINE: Preg Test, Ur: NEGATIVE

## 2019-12-03 LAB — GLUCOSE, CAPILLARY
Glucose-Capillary: 161 mg/dL — ABNORMAL HIGH (ref 70–99)
Glucose-Capillary: 169 mg/dL — ABNORMAL HIGH (ref 70–99)
Glucose-Capillary: 427 mg/dL — ABNORMAL HIGH (ref 70–99)
Glucose-Capillary: 421 mg/dL — ABNORMAL HIGH (ref 70–99)

## 2019-12-03 LAB — ABO/RH: ABO/RH(D): A POS

## 2019-12-03 SURGERY — ANTERIOR CERVICAL DECOMPRESSION/DISCECTOMY FUSION 1 LEVEL
Anesthesia: General

## 2019-12-03 MED ORDER — METFORMIN HCL 500 MG PO TABS: 500.0000 mg | ORAL_TABLET | Freq: Two times a day (BID) | ORAL | Status: DC

## 2019-12-03 MED ORDER — CHLORHEXIDINE GLUCONATE 0.12 % MT SOLN
15.0000 mL | Freq: Once | OROMUCOSAL | Status: AC
  Administered 2019-12-03: 15 mL via OROMUCOSAL

## 2019-12-03 MED ORDER — HYDROXYZINE HCL 50 MG/ML IM SOLN
50.0000 mg | Freq: Four times a day (QID) | INTRAMUSCULAR | Status: DC | PRN
  Administered 2019-12-03: 50 mg via INTRAMUSCULAR
  Filled 2019-12-03: qty 1

## 2019-12-03 MED ORDER — HYDROCODONE-ACETAMINOPHEN 5-325 MG PO TABS
1.0000 | ORAL_TABLET | ORAL | Status: DC | PRN
  Filled 2019-12-03: qty 1

## 2019-12-03 MED ORDER — METHOCARBAMOL 500 MG PO TABS
500.0000 mg | ORAL_TABLET | Freq: Four times a day (QID) | ORAL | Status: DC | PRN
  Administered 2019-12-03: 500 mg via ORAL
  Filled 2019-12-03 (×2): qty 1

## 2019-12-03 MED ORDER — HYDROMORPHONE HCL 1 MG/ML IJ SOLN: 0.5000 mg | INTRAMUSCULAR | Status: DC | PRN

## 2019-12-03 MED ORDER — ACETAMINOPHEN 325 MG PO TABS: 650.0000 mg | ORAL_TABLET | ORAL | Status: DC | PRN

## 2019-12-03 MED ORDER — SODIUM CHLORIDE 0.9% FLUSH: 3.0000 mL | Freq: Two times a day (BID) | INTRAVENOUS | Status: DC

## 2019-12-03 MED ORDER — THROMBIN 5000 UNITS EX SOLR
CUTANEOUS | Status: AC
  Filled 2019-12-03: qty 15000

## 2019-12-03 MED ORDER — ACETAMINOPHEN 650 MG RE SUPP: 650.0000 mg | RECTAL | Status: DC | PRN

## 2019-12-03 MED ORDER — FENTANYL CITRATE (PF) 250 MCG/5ML IJ SOLN
INTRAMUSCULAR | Status: DC | PRN
Start: 2019-12-03 — End: 2019-12-03
  Administered 2019-12-03: 150 ug via INTRAVENOUS
  Administered 2019-12-03 (×3): 50 ug via INTRAVENOUS

## 2019-12-03 MED ORDER — SENNA 8.6 MG PO TABS
1.0000 | ORAL_TABLET | Freq: Two times a day (BID) | ORAL | Status: DC
  Administered 2019-12-03: 8.6 mg via ORAL
  Filled 2019-12-03: qty 1

## 2019-12-03 MED ORDER — GLIPIZIDE 5 MG PO TABS: 5.0000 mg | ORAL_TABLET | Freq: Two times a day (BID) | ORAL | Status: DC

## 2019-12-03 MED ORDER — LACTATED RINGERS IV SOLN: INTRAVENOUS | Status: DC

## 2019-12-03 MED ORDER — LIDOCAINE-EPINEPHRINE 1 %-1:100000 IJ SOLN
INTRAMUSCULAR | Status: AC
  Filled 2019-12-03: qty 1

## 2019-12-03 MED ORDER — METOPROLOL SUCCINATE ER 50 MG PO TB24: 150.0000 mg | ORAL_TABLET | Freq: Every day | ORAL | Status: DC

## 2019-12-03 MED ORDER — SODIUM CHLORIDE 0.9 % IV SOLN
INTRAVENOUS | Status: DC | PRN
  Administered 2019-12-03: 500 mL

## 2019-12-03 MED ORDER — METFORMIN HCL 500 MG PO TABS
500.0000 mg | ORAL_TABLET | Freq: Two times a day (BID) | ORAL | Status: DC
  Administered 2019-12-03 – 2019-12-04 (×2): 500 mg via ORAL
  Filled 2019-12-03 (×2): qty 1

## 2019-12-03 MED ORDER — ONDANSETRON HCL 4 MG/2ML IJ SOLN
INTRAMUSCULAR | Status: AC
  Administered 2019-12-03: 4 mg via INTRAVENOUS
  Filled 2019-12-03: qty 2

## 2019-12-03 MED ORDER — PHENYLEPHRINE HCL-NACL 10-0.9 MG/250ML-% IV SOLN
INTRAVENOUS | Status: DC | PRN
  Administered 2019-12-03: 20 ug/min via INTRAVENOUS

## 2019-12-03 MED ORDER — SODIUM CHLORIDE 0.9 % IV SOLN: INTRAVENOUS | Status: DC

## 2019-12-03 MED ORDER — GLIPIZIDE 5 MG PO TABS
5.0000 mg | ORAL_TABLET | Freq: Two times a day (BID) | ORAL | Status: DC
  Administered 2019-12-03 – 2019-12-04 (×2): 5 mg via ORAL
  Filled 2019-12-03 (×2): qty 1

## 2019-12-03 MED ORDER — FUROSEMIDE 40 MG PO TABS
60.0000 mg | ORAL_TABLET | Freq: Every day | ORAL | Status: DC
  Administered 2019-12-03: 60 mg via ORAL
  Filled 2019-12-03: qty 1

## 2019-12-03 MED ORDER — PROPOFOL 10 MG/ML IV BOLUS
INTRAVENOUS | Status: DC | PRN
  Administered 2019-12-03: 130 mg via INTRAVENOUS

## 2019-12-03 MED ORDER — LIDOCAINE-EPINEPHRINE 1 %-1:100000 IJ SOLN
INTRAMUSCULAR | Status: DC | PRN
  Administered 2019-12-03: 3.5 mL

## 2019-12-03 MED ORDER — METHOCARBAMOL 1000 MG/10ML IJ SOLN
500.0000 mg | Freq: Four times a day (QID) | INTRAVENOUS | Status: DC | PRN
  Filled 2019-12-03: qty 5

## 2019-12-03 MED ORDER — THROMBIN 5000 UNITS EX SOLR
CUTANEOUS | Status: DC | PRN
  Administered 2019-12-03: 10000 [IU] via TOPICAL

## 2019-12-03 MED ORDER — ACETAMINOPHEN 500 MG PO TABS: 1000.0000 mg | ORAL_TABLET | Freq: Once | ORAL | Status: AC

## 2019-12-03 MED ORDER — CEFAZOLIN SODIUM-DEXTROSE 2-4 GM/100ML-% IV SOLN
INTRAVENOUS | Status: AC
  Filled 2019-12-03: qty 100

## 2019-12-03 MED ORDER — MIDAZOLAM HCL 2 MG/2ML IJ SOLN
INTRAMUSCULAR | Status: DC | PRN
  Administered 2019-12-03 (×2): 1 mg via INTRAVENOUS

## 2019-12-03 MED ORDER — ESMOLOL HCL 100 MG/10ML IV SOLN
INTRAVENOUS | Status: DC | PRN
  Administered 2019-12-03: 40 mg via INTRAVENOUS
  Administered 2019-12-03 (×2): 30 mg via INTRAVENOUS

## 2019-12-03 MED ORDER — OXYCODONE HCL 5 MG PO TABS
5.0000 mg | ORAL_TABLET | ORAL | Status: DC | PRN
  Filled 2019-12-03: qty 1

## 2019-12-03 MED ORDER — MENTHOL 3 MG MT LOZG: 1.0000 | LOZENGE | OROMUCOSAL | Status: DC | PRN

## 2019-12-03 MED ORDER — PHENYLEPHRINE 40 MCG/ML (10ML) SYRINGE FOR IV PUSH (FOR BLOOD PRESSURE SUPPORT)
PREFILLED_SYRINGE | INTRAVENOUS | Status: DC | PRN
  Administered 2019-12-03: 120 ug via INTRAVENOUS
  Administered 2019-12-03: 80 ug via INTRAVENOUS

## 2019-12-03 MED ORDER — PHENOL 1.4 % MT LIQD: 1.0000 | OROMUCOSAL | Status: DC | PRN

## 2019-12-03 MED ORDER — SACUBITRIL-VALSARTAN 97-103 MG PO TABS
1.0000 | ORAL_TABLET | Freq: Two times a day (BID) | ORAL | Status: DC
  Administered 2019-12-03: 1 via ORAL
  Filled 2019-12-03 (×2): qty 1

## 2019-12-03 MED ORDER — BISACODYL 10 MG RE SUPP: 10.0000 mg | Freq: Every day | RECTAL | Status: DC | PRN

## 2019-12-03 MED ORDER — DOCUSATE SODIUM 100 MG PO CAPS
100.0000 mg | ORAL_CAPSULE | Freq: Two times a day (BID) | ORAL | Status: DC
  Administered 2019-12-03: 100 mg via ORAL
  Filled 2019-12-03: qty 1

## 2019-12-03 MED ORDER — ESMOLOL HCL 100 MG/10ML IV SOLN
INTRAVENOUS | Status: AC
  Filled 2019-12-03: qty 10

## 2019-12-03 MED ORDER — CHLORHEXIDINE GLUCONATE CLOTH 2 % EX PADS: 6.0000 | MEDICATED_PAD | Freq: Once | CUTANEOUS | Status: DC

## 2019-12-03 MED ORDER — ONDANSETRON HCL 4 MG/2ML IJ SOLN: 4.0000 mg | Freq: Four times a day (QID) | INTRAMUSCULAR | Status: DC | PRN

## 2019-12-03 MED ORDER — ROCURONIUM BROMIDE 10 MG/ML (PF) SYRINGE
PREFILLED_SYRINGE | INTRAVENOUS | Status: DC | PRN
  Administered 2019-12-03: 100 mg via INTRAVENOUS

## 2019-12-03 MED ORDER — HEMOSTATIC AGENTS (NO CHARGE) OPTIME
TOPICAL | Status: DC | PRN
  Administered 2019-12-03: 1 via TOPICAL

## 2019-12-03 MED ORDER — SODIUM CHLORIDE 0.9% FLUSH: 3.0000 mL | INTRAVENOUS | Status: DC | PRN

## 2019-12-03 MED ORDER — PROPOFOL 10 MG/ML IV BOLUS
INTRAVENOUS | Status: AC
  Filled 2019-12-03: qty 20

## 2019-12-03 MED ORDER — ORAL CARE MOUTH RINSE: 15.0000 mL | Freq: Once | OROMUCOSAL | Status: AC

## 2019-12-03 MED ORDER — LIDOCAINE 2% (20 MG/ML) 5 ML SYRINGE
INTRAMUSCULAR | Status: DC | PRN
  Administered 2019-12-03: 100 mg via INTRAVENOUS

## 2019-12-03 MED ORDER — BUPIVACAINE HCL 0.5 % IJ SOLN
INTRAMUSCULAR | Status: DC | PRN
  Administered 2019-12-03: 3.5 mL

## 2019-12-03 MED ORDER — ONDANSETRON HCL 4 MG/2ML IJ SOLN
INTRAMUSCULAR | Status: DC | PRN
  Administered 2019-12-03: 4 mg via INTRAVENOUS

## 2019-12-03 MED ORDER — BUPIVACAINE HCL (PF) 0.5 % IJ SOLN
INTRAMUSCULAR | Status: AC
  Filled 2019-12-03: qty 30

## 2019-12-03 MED ORDER — FENTANYL CITRATE (PF) 250 MCG/5ML IJ SOLN
INTRAMUSCULAR | Status: AC
  Filled 2019-12-03: qty 5

## 2019-12-03 MED ORDER — INSULIN ASPART 100 UNIT/ML ~~LOC~~ SOLN
0.0000 [IU] | Freq: Every day | SUBCUTANEOUS | Status: DC
  Administered 2019-12-03: 5 [IU] via SUBCUTANEOUS

## 2019-12-03 MED ORDER — THROMBIN 5000 UNITS EX SOLR
OROMUCOSAL | Status: DC | PRN
  Administered 2019-12-03: 5 mL via TOPICAL

## 2019-12-03 MED ORDER — FLEET ENEMA 7-19 GM/118ML RE ENEM: 1.0000 | ENEMA | Freq: Once | RECTAL | Status: DC | PRN

## 2019-12-03 MED ORDER — DEXAMETHASONE SODIUM PHOSPHATE 10 MG/ML IJ SOLN
INTRAMUSCULAR | Status: DC | PRN
  Administered 2019-12-03: 10 mg via INTRAVENOUS

## 2019-12-03 MED ORDER — CEFAZOLIN SODIUM-DEXTROSE 2-4 GM/100ML-% IV SOLN
2.0000 g | Freq: Three times a day (TID) | INTRAVENOUS | Status: AC
  Administered 2019-12-03 – 2019-12-04 (×2): 2 g via INTRAVENOUS
  Filled 2019-12-03 (×2): qty 100

## 2019-12-03 MED ORDER — ACETAMINOPHEN 500 MG PO TABS
1000.0000 mg | ORAL_TABLET | Freq: Four times a day (QID) | ORAL | Status: DC
  Administered 2019-12-03 – 2019-12-04 (×3): 1000 mg via ORAL
  Filled 2019-12-03 (×3): qty 2

## 2019-12-03 MED ORDER — SODIUM CHLORIDE 0.9 % IV SOLN: 250.0000 mL | INTRAVENOUS | Status: DC

## 2019-12-03 MED ORDER — MIDAZOLAM HCL 2 MG/2ML IJ SOLN
INTRAMUSCULAR | Status: AC
  Filled 2019-12-03: qty 2

## 2019-12-03 MED ORDER — SPIRONOLACTONE 25 MG PO TABS
25.0000 mg | ORAL_TABLET | Freq: Every day | ORAL | Status: DC
  Administered 2019-12-03: 25 mg via ORAL
  Filled 2019-12-03 (×2): qty 1

## 2019-12-03 MED ORDER — 0.9 % SODIUM CHLORIDE (POUR BTL) OPTIME
TOPICAL | Status: DC | PRN
  Administered 2019-12-03: 1000 mL

## 2019-12-03 MED ORDER — CEFAZOLIN SODIUM-DEXTROSE 2-4 GM/100ML-% IV SOLN
2.0000 g | INTRAVENOUS | Status: AC
  Administered 2019-12-03: 2 g via INTRAVENOUS

## 2019-12-03 MED ORDER — ACETAMINOPHEN 500 MG PO TABS
ORAL_TABLET | ORAL | Status: AC
  Administered 2019-12-03: 1000 mg via ORAL
  Filled 2019-12-03: qty 2

## 2019-12-03 MED ORDER — ATORVASTATIN CALCIUM 10 MG PO TABS: 10.0000 mg | ORAL_TABLET | Freq: Every day | ORAL | Status: DC

## 2019-12-03 MED ORDER — ONDANSETRON HCL 4 MG PO TABS: 4.0000 mg | ORAL_TABLET | Freq: Four times a day (QID) | ORAL | Status: DC | PRN

## 2019-12-03 MED ORDER — INSULIN ASPART 100 UNIT/ML ~~LOC~~ SOLN
0.0000 [IU] | Freq: Three times a day (TID) | SUBCUTANEOUS | Status: DC
  Administered 2019-12-03: 3 [IU] via SUBCUTANEOUS
  Administered 2019-12-04: 8 [IU] via SUBCUTANEOUS

## 2019-12-03 MED ORDER — SENNOSIDES-DOCUSATE SODIUM 8.6-50 MG PO TABS: 1.0000 | ORAL_TABLET | Freq: Every evening | ORAL | Status: DC | PRN

## 2019-12-03 MED ORDER — FENTANYL CITRATE (PF) 100 MCG/2ML IJ SOLN: 25.0000 ug | INTRAMUSCULAR | Status: DC | PRN

## 2019-12-03 SURGICAL SUPPLY — 63 items
ADH SKN CLS APL DERMABOND .7 (GAUZE/BANDAGES/DRESSINGS) ×1
APL SKNCLS STERI-STRIP NONHPOA (GAUZE/BANDAGES/DRESSINGS)
BAG DECANTER FOR FLEXI CONT (MISCELLANEOUS) ×2 IMPLANT
BAND INSRT 18 STRL LF DISP RB (MISCELLANEOUS) ×2
BAND RUBBER #18 3X1/16 STRL (MISCELLANEOUS) ×4 IMPLANT
BENZOIN TINCTURE PRP APPL 2/3 (GAUZE/BANDAGES/DRESSINGS) IMPLANT
BLADE CLIPPER SURG (BLADE) IMPLANT
BLADE SURG 11 STRL SS (BLADE) ×2 IMPLANT
BLADE ULTRA TIP 2M (BLADE) IMPLANT
BUR MATCHSTICK NEURO 3.0 LAGG (BURR) ×2 IMPLANT
CANISTER SUCT 3000ML PPV (MISCELLANEOUS) ×2 IMPLANT
CARTRIDGE OIL MAESTRO DRILL (MISCELLANEOUS) ×1 IMPLANT
COVER WAND RF STERILE (DRAPES) ×1 IMPLANT
DECANTER SPIKE VIAL GLASS SM (MISCELLANEOUS) ×2 IMPLANT
DERMABOND ADVANCED (GAUZE/BANDAGES/DRESSINGS) ×1
DERMABOND ADVANCED .7 DNX12 (GAUZE/BANDAGES/DRESSINGS) ×1 IMPLANT
DEVICE ENDSKLTN TC NANOLCK 6MM (Cage) IMPLANT
DIFFUSER DRILL AIR PNEUMATIC (MISCELLANEOUS) ×2 IMPLANT
DRAPE C-ARM 42X72 X-RAY (DRAPES) ×4 IMPLANT
DRAPE HALF SHEET 40X57 (DRAPES) ×1 IMPLANT
DRAPE LAPAROTOMY 100X72 PEDS (DRAPES) ×2 IMPLANT
DRAPE MICROSCOPE LEICA (MISCELLANEOUS) ×2 IMPLANT
DRSG OPSITE 4X5.5 SM (GAUZE/BANDAGES/DRESSINGS) ×4 IMPLANT
DRSG OPSITE POSTOP 3X4 (GAUZE/BANDAGES/DRESSINGS) ×1 IMPLANT
DURAPREP 6ML APPLICATOR 50/CS (WOUND CARE) ×2 IMPLANT
ELECT COATED BLADE 2.86 ST (ELECTRODE) ×2 IMPLANT
ELECT REM PT RETURN 9FT ADLT (ELECTROSURGICAL) ×2
ELECTRODE REM PT RTRN 9FT ADLT (ELECTROSURGICAL) ×1 IMPLANT
ENDOSKELETON TC NANOLOCK 6MM (Cage) ×2 IMPLANT
GAUZE 4X4 16PLY RFD (DISPOSABLE) IMPLANT
GLOVE BIO SURGEON STRL SZ7.5 (GLOVE) IMPLANT
GLOVE BIOGEL PI IND STRL 7.5 (GLOVE) ×2 IMPLANT
GLOVE BIOGEL PI INDICATOR 7.5 (GLOVE) ×2
GLOVE ECLIPSE 7.0 STRL STRAW (GLOVE) ×2 IMPLANT
GLOVE EXAM NITRILE XL STR (GLOVE) IMPLANT
GOWN STRL REUS W/ TWL LRG LVL3 (GOWN DISPOSABLE) ×2 IMPLANT
GOWN STRL REUS W/ TWL XL LVL3 (GOWN DISPOSABLE) IMPLANT
GOWN STRL REUS W/TWL 2XL LVL3 (GOWN DISPOSABLE) IMPLANT
GOWN STRL REUS W/TWL LRG LVL3 (GOWN DISPOSABLE) ×4
GOWN STRL REUS W/TWL XL LVL3 (GOWN DISPOSABLE)
HEMOSTAT POWDER KIT SURGIFOAM (HEMOSTASIS) ×2 IMPLANT
KIT BASIN OR (CUSTOM PROCEDURE TRAY) ×2 IMPLANT
KIT TURNOVER KIT B (KITS) ×2 IMPLANT
NDL SPNL 22GX3.5 QUINCKE BK (NEEDLE) ×1 IMPLANT
NEEDLE HYPO 22GX1.5 SAFETY (NEEDLE) ×2 IMPLANT
NEEDLE SPNL 22GX3.5 QUINCKE BK (NEEDLE) ×2 IMPLANT
NS IRRIG 1000ML POUR BTL (IV SOLUTION) ×2 IMPLANT
OIL CARTRIDGE MAESTRO DRILL (MISCELLANEOUS) ×2
PACK LAMINECTOMY NEURO (CUSTOM PROCEDURE TRAY) ×2 IMPLANT
PAD ARMBOARD 7.5X6 YLW CONV (MISCELLANEOUS) ×6 IMPLANT
PLATE ZEVO 1LVL 17MM (Plate) ×1 IMPLANT
PUTTY DBF 1CC CORTICAL FIBERS (Putty) ×1 IMPLANT
SCREW 3.5 SELFDRILL 15MM VARI (Screw) ×4 IMPLANT
SPONGE INTESTINAL PEANUT (DISPOSABLE) ×2 IMPLANT
SPONGE SURGIFOAM ABS GEL SZ50 (HEMOSTASIS) ×2 IMPLANT
STAPLER VISISTAT 35W (STAPLE) ×2 IMPLANT
STRIP CLOSURE SKIN 1/2X4 (GAUZE/BANDAGES/DRESSINGS) IMPLANT
SUT VIC AB 3-0 SH 8-18 (SUTURE) ×2 IMPLANT
SUT VICRYL 3-0 RB1 18 ABS (SUTURE) ×3 IMPLANT
TAPE CLOTH 3X10 TAN LF (GAUZE/BANDAGES/DRESSINGS) ×2 IMPLANT
TOWEL GREEN STERILE (TOWEL DISPOSABLE) ×2 IMPLANT
TOWEL GREEN STERILE FF (TOWEL DISPOSABLE) ×2 IMPLANT
WATER STERILE IRR 1000ML POUR (IV SOLUTION) ×2 IMPLANT

## 2019-12-03 NOTE — Transfer of Care (Signed)
Immediate Anesthesia Transfer of Care Note  Patient: Tara Bryant  Procedure(s) Performed: ANTERIOR CERVICAL DECOMPRESSION/DISCECTOMY FUSION CERVICAL SIX- CERVICAL SEVEN (N/A )  Patient Location: PACU  Anesthesia Type:General  Level of Consciousness: awake, alert  and patient cooperative  Airway & Oxygen Therapy: Patient Spontanous Breathing and Patient connected to nasal cannula oxygen  Post-op Assessment: Report given to RN and Post -op Vital signs reviewed and stable  Post vital signs: Reviewed and stable  Last Vitals:  Vitals Value Taken Time  BP 119/101 12/03/19 1535  Temp    Pulse 82 12/03/19 1537  Resp 15 12/03/19 1537  SpO2 98 % 12/03/19 1537  Vitals shown include unvalidated device data.  Last Pain:  Vitals:   12/03/19 1249  TempSrc:   PainSc: 7          Complications: No complications documented.

## 2019-12-03 NOTE — Progress Notes (Signed)
Device turned on by Joey with [employer] at bedside in PACU.

## 2019-12-03 NOTE — Op Note (Signed)
PREOP DIAGNOSIS: Cervical spondylosis with radiculopathy, C6-7  POSTOP DIAGNOSIS: Same  PROCEDURE: 1. Discectomy at C6-7 for decompression of spinal cord and exiting nerve roots  2. Placement of intervertebral biomechanical device Medtronic Titan 51mm Medium lordotic cage 3. Placement of anterior instrumentation consisting of interbody plate and screws - 70VX plate Zevo plate, 79TJ screws  4. Use of morselized bone allograft  5. Arthrodesis C6-7, anterior interbody technique  6. Use of intraoperative microscope  SURGEON: Dr. Consuella Lose, MD  ASSISTANT: Ferne Reus, PA-C  ANESTHESIA: General Endotracheal  EBL: 50cc  SPECIMENS: None  DRAINS: None  COMPLICATIONS: None immediate  CONDITION: Hemodynamically stable to PACU  HISTORY: Tara Bryant is a 49 y.o. y.o. female who initially presented to the outpatient clinic with right-sided neck and arm pain consistent with C7 radiculopathy. MRI demonstrated relatively large right eccentric disc herniation at C6-7 with compression of the right C7 nerve root in the foramen.. Treatment options were discussed including surgical decompression and fusion.  Risks, benefits, and alternatives to surgery as well as the expected postoperative course were all reviewed with the patient.  After all questions were answered, informed consent was obtained.  PROCEDURE IN DETAIL: The patient was brought to the operating room and transferred to the operative table. After induction of general anesthesia, the patient was positioned on the operative table in the supine position with all pressure points meticulously padded. The skin of the neck was then prepped and draped in the usual sterile fashion.  After timeout was conducted, the skin was infiltrated with local anesthetic.  Right sided transverse skin incision was then made sharply and Bovie electrocautery was used to dissect the subcutaneous tissue until the platysma was identified.  Subcutaneous  tissue overlying the platysma was then dissected.  The platysma was then divided in craniocaudal direction. The sternocleidomastoid muscle was then identified and, utilizing natural fascial planes in the neck, the prevertebral fascia was identified and the carotid sheath was retracted laterally and the trachea and esophagus retracted medially. Again using fluoroscopy, the correct disc space was identified. Bovie electrocautery was used to dissect in the subperiosteal plane and elevate the bilateral longus coli muscles. Self-retaining retractors were then placed. At this point, the microscope was draped and brought into the field, and the remainder of the case was done under the microscope using microdissecting technique.  The disc space was incised sharply and rongeurs were used to initially complete a discectomy. The high-speed drill was then used to complete discectomy until the posterior annulus was identified and removed and the posterior longitudinal ligament was identified. Using a nerve hook, the PLL was elevated, and Kerrison rongeurs were used to remove the posterior longitudinal ligament and the ventral thecal sac was identified. Using a combination of curettes and rongeurs, complete decompression of the thecal sac and exiting nerve roots at this level was completed, and verified using micro-nerve hook.  I did note multiple large herniated disc fragments on the right side of the canal extending into the right foramen.  These were removed with a combination of Kerrison punches and ball-tipped dissectors.  Having completed our decompression, attention was turned to placement of the intervertebral device. Trial spacers were used to select a medium with 6 mm lordotic graft. This graft was then filled with morcellized allograft, and inserted under live fluoroscopy.  After placement of the intervertebral device, the above anterior cervical plate was selected, and placed across the interspace. Using a  high-speed drill, the cortex of the cervical vertebral bodies was  punctured, and screws inserted in the C6 and C7. Final fluoroscopic images in AP and lateral projections were taken to confirm good hardware placement.  At this point, after all counts were verified to be correct, meticulous hemostasis was secured using a combination of bipolar electrocautery and passive hemostatics. The platysma muscle was then closed using interrupted 3-0 Vicryl sutures, and the skin was closed with an interrupted 3-0 Vicry subcuticular stitch. Dermabond and sterile dressings were then applied and the drapes removed.  The patient tolerated the procedure well and was extubated in the room and taken to the postanesthesia care unit in stable condition.

## 2019-12-03 NOTE — Anesthesia Procedure Notes (Signed)
Procedure Name: Intubation Date/Time: 12/03/2019 1:48 PM Performed by: Janace Litten, CRNA Pre-anesthesia Checklist: Patient identified, Emergency Drugs available, Suction available and Patient being monitored Patient Re-evaluated:Patient Re-evaluated prior to induction Oxygen Delivery Method: Circle System Utilized Preoxygenation: Pre-oxygenation with 100% oxygen Induction Type: IV induction Ventilation: Mask ventilation without difficulty Laryngoscope Size: Glidescope Grade View: Grade I Tube type: Oral Tube size: 7.0 mm Number of attempts: 1 Airway Equipment and Method: Stylet Placement Confirmation: ETT inserted through vocal cords under direct vision,  positive ETCO2 and breath sounds checked- equal and bilateral Secured at: 21 cm Tube secured with: Tape Dental Injury: Teeth and Oropharynx as per pre-operative assessment  Comments: Elective glidescope due to cervical neuropathies

## 2019-12-03 NOTE — Progress Notes (Signed)
Orthopedic Tech Progress Note Patient Details:  Tara Bryant 01-Jul-1970 [id number]  Ortho Devices Type of Ortho Device: Soft collar Ortho Device/Splint Interventions: Application, Ordered   Post Interventions Patient Tolerated: Well   Chip Boer 12/03/2019, 6:32 PM

## 2019-12-03 NOTE — Anesthesia Procedure Notes (Signed)
Arterial Line Insertion Start/End8/27/2021 1:15 PM Performed by: Janace Litten, CRNA, CRNA  Patient location: Pre-op. Preanesthetic checklist: patient identified, IV checked, risks and benefits discussed, surgical consent and monitors and equipment checked Lidocaine 1% used for infiltration Left, radial was placed Catheter size: 20 G Hand hygiene performed  and maximum sterile barriers used   Attempts: 1 Procedure performed without using ultrasound guided technique. Following insertion, dressing applied and Biopatch. Post procedure assessment: normal  Patient tolerated the procedure well with no immediate complications.

## 2019-12-04 LAB — BASIC METABOLIC PANEL
Anion gap: 11 (ref 5–15)
BUN: 12 mg/dL (ref 6–20)
CO2: 25 mmol/L (ref 22–32)
Calcium: 9.4 mg/dL (ref 8.9–10.3)
Chloride: 101 mmol/L (ref 98–111)
Creatinine, Ser: 0.81 mg/dL (ref 0.44–1.00)
GFR calc Af Amer: >60 mL/min (ref >60)
GFR calc non Af Amer: >60 mL/min (ref >60)
Glucose, Bld: 265 mg/dL — ABNORMAL HIGH (ref 70–99)
Potassium: 3.8 mmol/L (ref 3.5–5.1)
Sodium: 137 mmol/L (ref 135–145)

## 2019-12-04 LAB — CBC
HCT: 37.6 % (ref 36.0–46.0)
Hemoglobin: 12.9 g/dL (ref 12.0–15.0)
MCH: 32.1 pg (ref 26.0–34.0)
MCHC: 34.3 g/dL (ref 30.0–36.0)
MCV: 93.5 fL (ref 80.0–100.0)
Platelets: 227 10*3/uL (ref 150–400)
RBC: 4.02 MIL/uL (ref 3.87–5.11)
RDW: 12 % (ref 11.5–15.5)
WBC: 15.1 10*3/uL — ABNORMAL HIGH (ref 4.0–10.5)
nRBC: 0 % (ref 0.0–0.2)

## 2019-12-04 LAB — PROTIME-INR
INR: 1.1 (ref 0.8–1.2)
Prothrombin Time: 14 seconds (ref 11.4–15.2)

## 2019-12-04 LAB — GLUCOSE, CAPILLARY
Glucose-Capillary: 273 mg/dL — ABNORMAL HIGH (ref 70–99)
Glucose-Capillary: 317 mg/dL — ABNORMAL HIGH (ref 70–99)

## 2019-12-04 LAB — APTT: aPTT: 25 seconds (ref 24–36)

## 2019-12-04 MED ORDER — HYDROCODONE-ACETAMINOPHEN 5-325 MG PO TABS: 1.0000 | ORAL_TABLET | ORAL | 0 refills | Status: DC | PRN

## 2019-12-04 MED ORDER — METHOCARBAMOL 500 MG PO TABS: 500.0000 mg | ORAL_TABLET | Freq: Four times a day (QID) | ORAL | 0 refills | Status: DC | PRN

## 2019-12-04 NOTE — Discharge Instructions (Signed)
Wound Care  You may remove outer bandage after 2 days and shower.  Keep incision open to air. Do not put any creams, lotions, or ointments on incision.  Activity Walk each and every day, increasing distance each day. No lifting greater than 5 lbs.  Avoid excessive neck motion. No driving for 2 weeks; may ride as a passenger locally. Wear neck brace at all times except when showering. Diet Resume your normal diet.  Return to Work Will be discussed at you follow up appointment. Call Your Doctor If Any of These Occur Redness, drainage, or swelling at the wound.  Temperature greater than 101 degrees. Severe pain not relieved by pain medication. Increased difficulty swallowing.  Incision starts to come apart. Follow Up Appt Call today and ask for appointment in 1-2 weeks ([phone]) or for problems.  If you have any hardware placed in your spine, you will need an x-ray before your appointment.   Resume Eliquis on Monday morning

## 2019-12-04 NOTE — Evaluation (Signed)
Physical Therapy Evaluation Patient Details Name: Tara Bryant MRN: [id number] DOB: 07/17/1970 Today's Date: 12/04/2019   History of Present Illness  49 y.o. femaleWho was found to have a large right eccentric disc herniation at C6-7 during workup for progressively worsening right-sided neck and arm pain. Pt has a history of combined systolic and diastolic heart failure, has an implanted AICD, and is also on Eliquis.  She does also have a somewhat poorly controlled type 2 diabetes. Pt underwent C6-7 ACDF on 12/03/2019.  Clinical Impression  Pt presents to PT with deficits in sensation in RUE and some pain noted in neck but mobilizes well. Pt needs supervision for bed mobility and stair negotiation, with cues provided for technique to maintain cervical precautions. PT addresses questions on prognosis for mobility progression and sleeping arrangements in the home. At this time the pt is able to perform all mobility without physical assistance and has sufficient caregiver support. Pt has no further acute PT needs and is encouraged to ambulate out of the room at least 3 times a day. PT recommends no PT or DME needs at this time. Acute PT signing off.    Follow Up Recommendations No PT follow up;Supervision - Intermittent    Equipment Recommendations  None recommended by PT    Recommendations for Other Services       Precautions / Restrictions Precautions Precautions: Cervical Precaution Booklet Issued: Yes (comment) Precaution Comments: PT verbally reviews precautions sheet and enforces precautions during mobility Required Braces or Orthoses: Cervical Brace Cervical Brace: Soft collar;At all times Restrictions Weight Bearing Restrictions: No      Mobility  Bed Mobility Overal bed mobility: Needs Assistance Bed Mobility: Rolling;Sidelying to Sit Rolling: Supervision Sidelying to sit: Supervision       General bed mobility comments: supervision for log roll technique and neck  precautions  Transfers Overall transfer level: Independent                  Ambulation/Gait Ambulation/Gait assistance: Independent Gait Distance (Feet): 300 Feet Assistive device: None Gait Pattern/deviations: WFL(Within Functional Limits) Gait velocity: functional Gait velocity interpretation: 1.31 - 2.62 ft/sec, indicative of limited community ambulator General Gait Details: steady step through gait, no significant balance deviations noted  Stairs Stairs: Yes Stairs assistance: Supervision Stair Management: One rail Right;Step to pattern Number of Stairs: 10    Wheelchair Mobility    Modified Rankin (Stroke Patients Only)       Balance Overall balance assessment: Mild deficits observed, not formally tested                                           Pertinent Vitals/Pain Pain Assessment: Faces Faces Pain Scale: Hurts even more Pain Location: neck Pain Descriptors / Indicators: Sore Pain Intervention(s): Monitored during session    Home Living Family/patient expects to be discharged to:: Private residence Living Arrangements: Children (67 y.o. dtr) Available Help at Discharge: Family;Available PRN/intermittently;Friend(s) Type of Home: House Home Access: Level entry     Home Layout: Two level;Laundry or work area in basement;Able to live on main level with [employer]: None      Prior Function Level of Independence: Independent               Hand Dominance        Extremity/Trunk Assessment   Upper Extremity Assessment Upper Extremity Assessment: RUE deficits/detail RUE Sensation: decreased  light touch    Lower Extremity Assessment Lower Extremity Assessment: Overall WFL for tasks assessed    Cervical / Trunk Assessment Cervical / Trunk Assessment: Other exceptions Cervical / Trunk Exceptions: soft collar  Communication   Communication: No difficulties  Cognition Arousal/Alertness:  Awake/alert Behavior During Therapy: WFL for tasks assessed/performed Overall Cognitive Status: Within Functional Limits for tasks assessed                                        General Comments General comments (skin integrity, edema, etc.): VSS on RA    Exercises     Assessment/Plan    PT Assessment Patent does not need any further PT services  PT Problem List         PT Treatment Interventions      PT Goals (Current goals can be found in the Care Plan section)       Frequency     Barriers to discharge        Co-evaluation               AM-PAC PT "6 Clicks" Mobility  Outcome Measure Help needed turning from your back to your side while in a flat bed without using bedrails?: None Help needed moving from lying on your back to sitting on the side of a flat bed without using bedrails?: None Help needed moving to and from a bed to a chair (including a wheelchair)?: None Help needed standing up from a chair using your arms (e.g., wheelchair or bedside chair)?: None Help needed to walk in hospital room?: None Help needed climbing 3-5 steps with a railing? : None 6 Click Score: 24    End of Session Equipment Utilized During Treatment: Cervical collar Activity Tolerance: Patient tolerated treatment well Patient left: in chair;with call bell/phone within reach;with nursing/sitter in room Nurse Communication: Mobility status      Time: 4932-4199 PT Time Calculation (min) (ACUTE ONLY): 21 min   Charges:   PT Evaluation $PT Eval Low Complexity: [place], PT, DPT Acute Rehabilitation Pager: [phone]   Zenaida Niece 12/04/2019, 9:09 AM

## 2019-12-04 NOTE — Discharge Summary (Signed)
Physician Discharge Summary  [name] MRN: [id number] DOB/AGE: Oct 30, 1970 49 y.o.  Admit date: 12/03/2019 Discharge date: 12/04/2019  Admission Diagnoses:  Discharge Diagnoses:  Active Problems:   Cervical radiculopathy   Discharged Condition: good  Hospital Course: Patient been to the hospital where she went uncomplicated anterior cervical decompression fusion.  Postop and doing very well.  Preoperative neck and upper extremity pain resolved.  Standing walking and voiding without difficulty.  Ready for discharge home.  Consults:   Significant Diagnostic Studies:   Treatments:   Discharge Exam: Blood pressure 117/65, pulse 89, temperature 98.6 F (37 C), temperature source Oral, resp. rate 18, height 5' 7"  (1.702 m), weight 92.2 kg, SpO2 98 %. Awake and alert.  Oriented and appropriate.  Motor and sensory function intact.  Wound clean and dry.  Chest and abdomen benign.  Disposition: Discharge disposition: 01-Home or Self Care        Allergies as of 12/04/2019      Reactions   Aspartame And Phenylalanine Dermatitis      Medication List    TAKE these medications   Accu-Chek Guide w/Device Kit 1 Piece by Does not apply route as directed.   apixaban 5 MG Tabs tablet Commonly known as: Eliquis Take 1 tablet (5 mg total) by mouth 2 (two) times daily.   atorvastatin 10 MG tablet Commonly known as: LIPITOR Take 1 tablet (10 mg total) by mouth daily.   B-D ULTRAFINE III SHORT PEN 31G X 8 MM Misc Generic drug: Insulin Pen Needle USE FOR INSULIN INJECTIONS TWICE DAILY   Entresto 97-103 MG Generic drug: sacubitril-valsartan Take 1 tablet by mouth 2 (two) times daily.   furosemide 40 MG tablet Commonly known as: LASIX Take  1/2 tablets daily (60 mg)   May take additional 20 mg daily for Shortness of breath What changed:   how much to take  how to take this  when to take this  additional instructions   glipiZIDE 5 MG tablet Commonly  known as: GLUCOTROL TAKE 1 TABLET(5 MG) BY MOUTH TWICE DAILY BEFORE A MEAL   glucose blood test strip 1 each by Other route in the morning, at noon, in the evening, and at bedtime. Use as instructed to check blood sugar four times daily   HYDROcodone-acetaminophen 5-325 MG tablet Commonly known as: NORCO/VICODIN Take 1-2 tablets by mouth every 4 (four) hours as needed for moderate pain ((score 4 to 6)). What changed:   how much to take  how to take this  when to take this  reasons to take this  additional instructions   insulin aspart protamine - aspart (70-30) 100 UNIT/ML FlexPen Commonly known as: NOVOLOG 70/30 MIX Inject 0.7-0.8 mLs (70-80 Units total) into the skin 2 (two) times daily with a meal. When glucose is above 90 and you are eating. What changed:   when to take this  additional instructions   metFORMIN 500 MG tablet Commonly known as: GLUCOPHAGE TAKE 1 TABLET(500 MG) BY MOUTH TWICE DAILY WITH A MEAL What changed:   how much to take  how to take this  when to take this  additional instructions   methocarbamol 500 MG tablet Commonly known as: ROBAXIN Take 1 tablet (500 mg total) by mouth every 6 (six) hours as needed for muscle spasms.   metoprolol succinate 100 MG 24 hr tablet Commonly known as: TOPROL-XL Take 1&1/2 tablets (150 mg) by mouth daily. Take with or immediately following a meal. What changed:   how  much to take  how to take this  when to take this   Norlyda 0.35 MG tablet Generic drug: norethindrone Take 1 tablet by mouth at bedtime.   OneTouch Delica Plus [id number] Misc USE TO TEST BLOOD SUGAR FOUR TIMES DAILY   spironolactone 25 MG tablet Commonly known as: ALDACTONE Take 1 tablet (25 mg total) by mouth daily.   tiZANidine 2 MG tablet Commonly known as: ZANAFLEX Take 1 tablet (2 mg total) by mouth every 8 (eight) hours as needed for muscle spasms (caution causes drowsiness).   trolamine salicylate 10 % cream Commonly  known as: ASPERCREME Apply 1 application topically 4 (four) times daily as needed for muscle pain.        Signed: Cooper Render Julyana Woolverton 12/04/2019, 10:04 AM

## 2019-12-04 NOTE — Evaluation (Signed)
Occupational Therapy Evaluation and Discharge Patient Details Name: Tara Bryant MRN: [id number] DOB: July 01, 1970 Today's Date: 12/04/2019    History of Present Illness 49 y.o. femaleWho was found to have a large right eccentric disc herniation at C6-7 during workup for progressively worsening right-sided neck and arm pain. Pt has a history of combined systolic and diastolic heart failure, has an implanted AICD, and is also on Eliquis.  She does also have a somewhat poorly controlled type 2 diabetes. Pt underwent C6-7 ACDF on 12/03/2019.   Clinical Impression   This 49 y/o female presents with the above. PTA pt performing ADL, iADL and mobility tasks independently. Pt currently performing mobility and ADL tasks grossly at supervision level. Educated pt re: brace wear, cervical precautions, safety and compensatory techniques for completing ADL and functional transfers with pt verbalizing and/or return demonstrating good understanding throughout. Pt with no overt UE deficits noted at this time. She reports plans to return home with intermittent assist from daughter and mother. Questions answered throughout session with no further acute OT needs identified. Acute OT to sign off, please re-consult should pt's needs change.     Follow Up Recommendations  No OT follow up;Supervision - Intermittent    Equipment Recommendations  None recommended by OT           Precautions / Restrictions Precautions Precautions: Cervical Precaution Booklet Issued: Yes (comment) Precaution Comments: verbally reviewed precautions sheet and enforces precautions during mobility Required Braces or Orthoses: Cervical Brace Cervical Brace: Soft collar;At all times;Other (comment) (can remove to shower) Restrictions Weight Bearing Restrictions: No      Mobility Bed Mobility Overal bed mobility: Needs Assistance Bed Mobility: Rolling;Sidelying to Sit Rolling: Supervision Sidelying to sit: Supervision        General bed mobility comments: OOB in recliner upon arrival   Transfers Overall transfer level: Modified independent                    Balance Overall balance assessment: Mild deficits observed, not formally tested                                         ADL either performed or assessed with clinical judgement   ADL Overall ADL's : Needs assistance/impaired Eating/Feeding: Modified independent;Sitting   Grooming: Supervision/safety;Standing   Upper Body Bathing: Supervision/ safety;Sitting;Standing   Lower Body Bathing: Supervison/ safety;Sit to/from stand   Upper Body Dressing : Modified independent;Sitting Upper Body Dressing Details (indicate cue type and reason): donning bra and overhead shirt  Lower Body Dressing: Supervision/safety;Sit to/from stand Lower Body Dressing Details (indicate cue type and reason): donning pants/underwear  Toilet Transfer: Supervision/safety;Ambulation   Toileting- Clothing Manipulation and Hygiene: Supervision/safety;Sit to/from Nurse, children's Details (indicate cue type and reason): educated in safe transfer techniques  Functional mobility during ADLs: Supervision/safety       Vision         Perception     Praxis      Pertinent Vitals/Pain Pain Assessment: Faces Faces Pain Scale: Hurts little more Pain Location: neck Pain Descriptors / Indicators: Sore Pain Intervention(s): Monitored during session;Repositioned     Hand Dominance     Extremity/Trunk Assessment Upper Extremity Assessment Upper Extremity Assessment: RUE deficits/detail RUE Sensation: decreased light touch   Lower Extremity Assessment Lower Extremity Assessment: Defer to PT evaluation   Cervical / Trunk Assessment Cervical / Trunk  Assessment: Other exceptions Cervical / Trunk Exceptions: soft collar   Communication Communication Communication: No difficulties   Cognition Arousal/Alertness:  Awake/alert Behavior During Therapy: WFL for tasks assessed/performed Overall Cognitive Status: Within Functional Limits for tasks assessed                                     General Comments  VSS on RA    Exercises     Shoulder Instructions      Home Living Family/patient expects to be discharged to:: Private residence Living Arrangements: Children (71 y.o. dtr) Available Help at Discharge: Family;Available PRN/intermittently;Friend(s) Type of Home: House Home Access: Level entry     Home Layout: Two level;Laundry or work area in basement;Able to live on main level with bedroom/bathroom     Bathroom Shower/Tub: Teacher, early years/pre: Handicapped height     Home Equipment: None          Prior Functioning/Environment Level of Independence: Independent                 OT Problem List: Decreased strength;Decreased activity tolerance;Decreased knowledge of use of DME or AE;Decreased knowledge of precautions;Impaired sensation      OT Treatment/Interventions:      OT Goals(Current goals can be found in the care plan section) Acute Rehab OT Goals Patient Stated Goal: home and return to PTA OT Goal Formulation: All assessment and education complete, DC therapy  OT Frequency:     Barriers to D/C:            Co-evaluation              AM-PAC OT "6 Clicks" Daily Activity     Outcome Measure Help from another person eating meals?: None Help from another person taking care of personal grooming?: A Little Help from another person toileting, which includes using toliet, bedpan, or urinal?: A Little Help from another person bathing (including washing, rinsing, drying)?: A Little Help from another person to put on and taking off regular upper body clothing?: A Little Help from another person to put on and taking off regular lower body clothing?: A Little 6 Click Score: 19   End of Session Equipment Utilized During Treatment:  Cervical collar Nurse Communication: Mobility status  Activity Tolerance: Patient tolerated treatment well Patient left: in chair;with call bell/phone within reach  OT Visit Diagnosis: Other abnormalities of gait and mobility (R26.89)                Time: 4097-3532 OT Time Calculation (min): 33 min Charges:  OT General Charges $OT Visit: 1 Visit OT Evaluation $OT Eval Low Complexity: 1 Low OT Treatments $Self Care/Home Management : 8-22 mins  Lou Cal, OT Acute Rehabilitation Services Pager [phone] Office [phone]   Tara Bryant 12/04/2019, 10:19 AM

## 2019-12-04 NOTE — Progress Notes (Signed)
Patient is discharged from room 3C02 at this time. Alert and in stable condition. IV site d/c'd and instructions read to patient and mother with understanding verbalized and all questions answered. Left unit via wheelchair with all belongings at side.

## 2019-12-05 NOTE — Anesthesia Postprocedure Evaluation (Signed)
Anesthesia Post Note  Patient: Tara Bryant  Procedure(s) Performed: ANTERIOR CERVICAL DECOMPRESSION/DISCECTOMY FUSION CERVICAL SIX- CERVICAL SEVEN (N/A )     Patient location during evaluation: PACU Anesthesia Type: General Level of consciousness: awake and alert Pain management: pain level controlled Vital Signs Assessment: post-procedure vital signs reviewed and stable Respiratory status: spontaneous breathing, nonlabored ventilation, respiratory function stable and patient connected to nasal cannula oxygen Cardiovascular status: blood pressure returned to baseline and stable Postop Assessment: no apparent nausea or vomiting Anesthetic complications: no   No complications documented.  Last Vitals:  Vitals:   12/04/19 0413 12/04/19 0924  BP: (!) 117/58 117/65  Pulse: 74 89  Resp: 20 18  Temp: 36.7 C 37 C  SpO2: 98% 98%    Last Pain:  Vitals:   12/04/19 0924  TempSrc: Oral  PainSc:                  Danialle Dement L Pamela Intrieri

## 2019-12-06 ENCOUNTER — Encounter (HOSPITAL_COMMUNITY): Payer: Self-pay | Admitting: Neurosurgery

## 2020-01-05 ENCOUNTER — Ambulatory Visit: Payer: 59 | Admitting: "Endocrinology

## 2020-01-06 ENCOUNTER — Other Ambulatory Visit: Payer: Self-pay

## 2020-01-06 ENCOUNTER — Other Ambulatory Visit (HOSPITAL_COMMUNITY)
Admission: RE | Admit: 2020-01-06 | Discharge: 2020-01-06 | Disposition: A | Discharge status: Home / Self Care | Payer: 59 | Source: Ambulatory Visit | Attending: "Endocrinology | Admitting: "Endocrinology

## 2020-01-06 DIAGNOSIS — E1165 Type 2 diabetes mellitus with hyperglycemia: Secondary | ICD-10-CM | POA: Diagnosis present

## 2020-01-06 DIAGNOSIS — E782 Mixed hyperlipidemia: Secondary | ICD-10-CM | POA: Diagnosis present

## 2020-01-06 LAB — COMPREHENSIVE METABOLIC PANEL
ALT: 22 U/L (ref 0–44)
AST: 17 U/L (ref 15–41)
Albumin: 4 g/dL (ref 3.5–5.0)
Alkaline Phosphatase: 78 U/L (ref 38–126)
Anion gap: 10 (ref 5–15)
BUN: 15 mg/dL (ref 6–20)
CO2: 24 mmol/L (ref 22–32)
Calcium: 9.2 mg/dL (ref 8.9–10.3)
Chloride: 101 mmol/L (ref 98–111)
Creatinine, Ser: 0.71 mg/dL (ref 0.44–1.00)
GFR calc Af Amer: >60 mL/min (ref >60)
GFR calc non Af Amer: >60 mL/min (ref >60)
Glucose, Bld: 190 mg/dL — ABNORMAL HIGH (ref 70–99)
Potassium: 4.1 mmol/L (ref 3.5–5.1)
Sodium: 135 mmol/L (ref 135–145)
Total Bilirubin: 0.5 mg/dL (ref 0.3–1.2)
Total Protein: 7.6 g/dL (ref 6.5–8.1)

## 2020-01-06 LAB — T4, FREE: Free T4: 0.84 ng/dL (ref 0.61–1.12)

## 2020-01-06 LAB — LIPID PANEL
Cholesterol: 217 mg/dL — ABNORMAL HIGH (ref 0–200)
HDL: 43 mg/dL (ref >40)
LDL Cholesterol: 142 mg/dL — ABNORMAL HIGH (ref 0–99)
Total CHOL/HDL Ratio: 5 RATIO
Triglycerides: 159 mg/dL — ABNORMAL HIGH (ref <150)
VLDL: 32 mg/dL (ref 0–40)

## 2020-01-06 LAB — TSH: TSH: 1.844 u[IU]/mL (ref 0.350–4.500)

## 2020-01-10 ENCOUNTER — Ambulatory Visit: Payer: 59 | Admitting: Cardiology

## 2020-01-13 ENCOUNTER — Encounter: Payer: Self-pay | Admitting: Nurse Practitioner

## 2020-01-13 ENCOUNTER — Other Ambulatory Visit: Payer: Self-pay

## 2020-01-13 ENCOUNTER — Ambulatory Visit (INDEPENDENT_AMBULATORY_CARE_PROVIDER_SITE_OTHER): Payer: 59 | Admitting: Nurse Practitioner

## 2020-01-13 VITALS — BP 128/78 | HR 96 | Ht 67.5 in | Wt 201.8 lb

## 2020-01-13 DIAGNOSIS — E782 Mixed hyperlipidemia: Secondary | ICD-10-CM | POA: Diagnosis not present

## 2020-01-13 DIAGNOSIS — E1165 Type 2 diabetes mellitus with hyperglycemia: Secondary | ICD-10-CM | POA: Diagnosis not present

## 2020-01-13 DIAGNOSIS — I10 Essential (primary) hypertension: Secondary | ICD-10-CM

## 2020-01-13 MED ORDER — INSULIN ASPART PROT & ASPART (70-30 MIX) 100 UNIT/ML PEN: PEN_INJECTOR | SUBCUTANEOUS | 2 refills | Status: DC

## 2020-01-13 NOTE — Progress Notes (Signed)
01/13/2020, 11:02 AM  Endocrinology follow-up note                              Subjective:    [name], female    DOB: [birth year].  Tara Bryant is being seen in follow-up in  management of currently uncontrolled symptomatic diabetes requested by  Erven Colla, DO.   Past Medical History:  Diagnosis Date  . AICD (automatic cardioverter/defibrillator) present   . Arrhythmia   . Arthritis   . CAD (coronary artery disease)    a. 03/2018: cath showing 60% mid-LAD stenosis not significant by FFR.   Marland Kitchen Chronic combined systolic (congestive) and diastolic (congestive) heart failure ([place])    a. 03/2018: found to have a newly reduced EF of 20% by echocardiogram  . Depression    denies  . Diabetes mellitus   . GERD (gastroesophageal reflux disease)    h/o of  . Hypertension   . Microproteinuria   . S/P thyroid biopsy 2020   follow up once a year,   . Sleep apnea    Past Surgical History:  Procedure Laterality Date  . ANTERIOR CERVICAL DECOMP/DISCECTOMY FUSION N/A 12/03/2019   Procedure: ANTERIOR CERVICAL DECOMPRESSION/DISCECTOMY FUSION CERVICAL SIX- CERVICAL SEVEN;  Surgeon: Consuella Lose, MD;  Location: [place] [facility];  Service: Neurosurgery;  Laterality: N/A;  ANTERIOR CERVICAL DECOMPRESSION/DISCECTOMY FUSION CERVICAL SIX- CERVICAL SEVEN  . BACK SURGERY  2010  . BREAST BIOPSY Left 2017   benign  . CARDIAC CATHETERIZATION    . ICD IMPLANT N/A 10/29/2018   Procedure: ICD IMPLANT;  Surgeon: Evans Lance, MD;  Location: [facility];  Service: Cardiovascular;  Laterality: N/A;  . INTRAVASCULAR PRESSURE WIRE/FFR STUDY N/A 03/24/2018   Procedure: INTRAVASCULAR PRESSURE WIRE/FFR STUDY;  Surgeon: Leonie Man, MD;  Location: [facility];  Service: Cardiovascular;  Laterality: N/A;  . NOSE SURGERY    . RIGHT/LEFT HEART CATH AND CORONARY ANGIOGRAPHY N/A 03/24/2018    Procedure: RIGHT/LEFT HEART CATH AND CORONARY ANGIOGRAPHY;  Surgeon: Leonie Man, MD;  Location: [facility];  Service: Cardiovascular;  Laterality: N/A;  . TONSILLECTOMY    . WISDOM TOOTH EXTRACTION     Social History   Socioeconomic History  . Marital status: Married    Spouse name: Not on file  . Number of children: Not on file  . Years of education: Not on file  . Highest education level: Not on file  Occupational History  . Not on file  Tobacco Use  . Smoking status: Never Smoker  . Smokeless tobacco: Never Used  Vaping Use  . Vaping Use: Never used  Substance and Sexual Activity  . Alcohol use: No  . Drug use: No  . Sexual activity: Yes    Birth control/protection: Pill  Other Topics Concern  . Not on file  Social History Narrative  . Not on file   Social Determinants of Health   Financial Resource Strain:   . Difficulty of Paying Living Expenses: Not on file  Food Insecurity:   . Worried About [employer] of  Food in the Last Year: Not on file  . Ran Out of Food in the Last Year: Not on file  Transportation Needs:   . Lack of Transportation (Medical): Not on file  . Lack of Transportation (Non-Medical): Not on file  Physical Activity:   . Days of Exercise per Week: Not on file  . Minutes of Exercise per Session: Not on file  Stress:   . Feeling of Stress : Not on file  Social Connections:   . Frequency of Communication with Friends and Family: Not on file  . Frequency of Social Gatherings with Friends and Family: Not on file  . Attends Religious Services: Not on file  . Active Member of Clubs or Organizations: Not on file  . Attends Archivist Meetings: Not on file  . Marital Status: Not on file   Outpatient Encounter Medications as of 01/13/2020  Medication Sig  . apixaban (ELIQUIS) 5 MG TABS tablet Take 1 tablet (5 mg total) by mouth 2 (two) times daily.  Marland Kitchen atorvastatin (LIPITOR) 10 MG tablet Take 1 tablet (10 mg total) by mouth  daily.  . B-D ULTRAFINE III SHORT PEN 31G X 8 MM MISC USE FOR INSULIN INJECTIONS TWICE DAILY  . Blood Glucose Monitoring Suppl (ACCU-CHEK GUIDE) w/Device KIT 1 Piece by Does not apply route as directed.  . furosemide (LASIX) 40 MG tablet Take  1/2 tablets daily (60 mg)   May take additional 20 mg daily for Shortness of breath (Patient taking differently: Take 60 mg by mouth daily. May take additional 20 mg daily for Shortness of breath)  . glipiZIDE (GLUCOTROL) 5 MG tablet TAKE 1 TABLET(5 MG) BY MOUTH TWICE DAILY BEFORE A MEAL  . glucose blood test strip 1 each by Other route in the morning, at noon, in the evening, and at bedtime. Use as instructed to check blood sugar four times daily  . HYDROcodone-acetaminophen (NORCO/VICODIN) 5-325 MG tablet Take 1-2 tablets by mouth every 4 (four) hours as needed for moderate pain ((score 4 to 6)).  Marland Kitchen insulin aspart protamine - aspart (NOVOLOG 70/30 MIX) (70-30) 100 UNIT/ML FlexPen Inject 75 units with breakfast and 90 units with supper if glucose is above 90 and you are eating.  . Lancets (ONETOUCH DELICA PLUS [id number]) MISC USE TO TEST BLOOD SUGAR FOUR TIMES DAILY  . metFORMIN (GLUCOPHAGE) 500 MG tablet TAKE 1 TABLET(500 MG) BY MOUTH TWICE DAILY WITH A MEAL (Patient taking differently: Take 500 mg by mouth in the morning and at bedtime. )  . methocarbamol (ROBAXIN) 500 MG tablet Take 1 tablet (500 mg total) by mouth every 6 (six) hours as needed for muscle spasms.  . metoprolol succinate (TOPROL-XL) 100 MG 24 hr tablet Take 1&1/2 tablets (150 mg) by mouth daily. Take with or immediately following a meal. (Patient taking differently: Take 150 mg by mouth daily. Take 1&1/2 tablets (150 mg) by mouth daily. Take with or immediately following a meal.)  . NORLYDA 0.35 MG tablet Take 1 tablet by mouth at bedtime.   . sacubitril-valsartan (ENTRESTO) 97-103 MG Take 1 tablet by mouth 2 (two) times daily.  Marland Kitchen spironolactone (ALDACTONE) 25 MG tablet Take 1 tablet (25 mg  total) by mouth daily.  Marland Kitchen tiZANidine (ZANAFLEX) 2 MG tablet Take 1 tablet (2 mg total) by mouth every 8 (eight) hours as needed for muscle spasms (caution causes drowsiness).  . trolamine salicylate (ASPERCREME) 10 % cream Apply 1 application topically 4 (four) times daily as needed for muscle pain.  . [  DISCONTINUED] insulin aspart protamine - aspart (NOVOLOG 70/30 MIX) (70-30) 100 UNIT/ML FlexPen Inject 0.7-0.8 mLs (70-80 Units total) into the skin 2 (two) times daily with a meal. When glucose is above 90 and you are eating. (Patient taking differently: Inject 70-80 Units into the skin See admin instructions. Inject 70 units subcutaneously in the morning & inject 80 units subcutaneously in the evening.)   No facility-administered encounter medications on file as of 01/13/2020.    ALLERGIES: Allergies  Allergen Reactions  . Aspartame And Phenylalanine Dermatitis    VACCINATION STATUS: Immunization History  Administered Date(s) Administered  . Influenza Split 02/02/2013  . Influenza,inj,Quad PF,6+ Mos 01/11/2014, 12/15/2014, 12/15/2015, 01/03/2017, 01/28/2018, 02/03/2019  . Influenza-Unspecified 03/08/2011  . Pneumococcal Polysaccharide-23 03/30/2009  . Td 02/04/2006  . Tdap 02/26/2018    Diabetes She presents for her follow-up diabetic visit. She has type 2 diabetes mellitus. Onset time: She was diagnosed at approximate age of 28 years. Her disease course has been stable. There are no hypoglycemic associated symptoms. Pertinent negatives for hypoglycemia include no confusion, headaches, pallor or seizures. Pertinent negatives for diabetes include no chest pain, no fatigue, no polydipsia, no polyphagia, no polyuria and no weight loss. There are no hypoglycemic complications. Symptoms are stable. Diabetic complications include heart disease. Risk factors for coronary artery disease include diabetes mellitus, dyslipidemia, hypertension, family history and sedentary lifestyle. Current diabetic  treatment includes insulin injections and oral agent (dual therapy). She is compliant with treatment most of the time. Her weight is fluctuating minimally. She is following a generally unhealthy diet. When asked about meal planning, she reported none. She has had a previous visit with a dietitian. She never participates in exercise. Her home blood glucose trend is fluctuating minimally. Her breakfast blood glucose range is generally >200 mg/dl. Her bedtime blood glucose range is generally 180-200 mg/dl. (She presents today with her meter and logs showing above target fasting and postprandial glycemic profile overall.  Her previsit A1C was 7.7%, unchanged from last visit.  Since last visit she has had multiple health issues arise and she recently had surgery on her C-spine (currently recovering well).  She does have some mild episodes of postprandial hypoglycemia, may be associated with inadequate meal intake.  ) An ACE inhibitor/angiotensin II receptor blocker is being taken. She does not see a podiatrist.Eye exam is current.  Hyperlipidemia This is a chronic problem. The current episode started more than 1 year ago. The problem is uncontrolled. Recent lipid tests were reviewed and are high. Exacerbating diseases include diabetes. Factors aggravating her hyperlipidemia include beta blockers and fatty foods. Pertinent negatives include no chest pain, myalgias or shortness of breath. Current antihyperlipidemic treatment includes statins. The current treatment provides no improvement of lipids. Compliance problems include adherence to diet and adherence to exercise.  Risk factors for coronary artery disease include dyslipidemia, diabetes mellitus, hypertension, a sedentary lifestyle and family history.  Hypertension This is a chronic problem. The current episode started more than 1 year ago. The problem has been gradually improving since onset. The problem is controlled. Pertinent negatives include no chest pain,  headaches, palpitations or shortness of breath. There are no associated agents to hypertension. Risk factors for coronary artery disease include diabetes mellitus, dyslipidemia, family history and sedentary lifestyle. Past treatments include beta blockers, ACE inhibitors and diuretics. The current treatment provides significant improvement. There are no compliance problems.  Hypertensive end-organ damage includes CAD/MI and heart failure.     Review of systems  Constitutional: + Minimally fluctuating body  weight,  current Body mass index is 31.14 kg/m. , no fatigue, no subjective hyperthermia, no subjective hypothermia Eyes: no blurry vision, no xerophthalmia ENT: no sore throat, no nodules palpated in throat, no dysphagia/odynophagia, no hoarseness Cardiovascular: no chest pain, some intermittent SOB r/t CHF, no palpitations, no leg swelling Respiratory: no cough, some intermittent shortness of breath r/t CHF Gastrointestinal: no nausea/vomiting/diarrhea Musculoskeletal: no muscle/joint aches Skin: no rashes, no hyperemia, healing surgical incision to right anterior neck Neurological: no tremors, no numbness, no tingling, no dizziness Psychiatric: no depression, no anxiety    Objective:    BP 128/78 (BP Location: Right Arm)   Pulse 96   Ht 5' 7.5" (1.715 m)   Wt 201 lb 12.8 oz (91.5 kg)   BMI 31.14 kg/m   Wt Readings from Last 3 Encounters:  01/13/20 201 lb 12.8 oz (91.5 kg)  12/03/19 203 lb 4.8 oz (92.2 kg)  11/30/19 203 lb 4.8 oz (92.2 kg)    BP Readings from Last 3 Encounters:  01/13/20 128/78  12/04/19 117/65  11/30/19 133/82     Physical Exam- Limited  Constitutional:  Body mass index is 31.14 kg/m. , not in acute distress, mildly anxious state of mind Eyes:  EOMI, no exophthalmos Neck: Supple Thyroid: No gross goiter Cardiovascular: RRR, no murmers, rubs, or gallops, no edema Respiratory: Adequate breathing efforts, no crackles, rales, rhonchi, or  wheezing Musculoskeletal: no gross deformities, strength intact in all four extremities, no gross restriction of joint movements Skin:  no rashes, no hyperemia, healing surgical incision to right anterior neck Neurological: no tremor with outstretched hands   POCT ABI Results 01/13/20   Right ABI:  1.32      Left ABI:  1.31  Right leg systolic / diastolic: 937/16 mmHg Left leg systolic / diastolic: 967/893 mmHg  Arm systolic / diastolic: 810/17 mmHG  Detailed report will be scanned into patient chart.   CMP ( most recent) CMP     Component Value Date/Time   NA 135 01/06/2020 1053   NA 138 04/03/2018 1240   K 4.1 01/06/2020 1053   CL 101 01/06/2020 1053   CO2 24 01/06/2020 1053   GLUCOSE 190 (H) 01/06/2020 1053   BUN 15 01/06/2020 1053   BUN 20 04/03/2018 1240   CREATININE 0.71 01/06/2020 1053   CREATININE 0.74 04/06/2014 0934   CALCIUM 9.2 01/06/2020 1053   PROT 7.6 01/06/2020 1053   PROT 6.5 03/26/2017 0916   ALBUMIN 4.0 01/06/2020 1053   ALBUMIN 4.0 03/26/2017 0916   AST 17 01/06/2020 1053   ALT 22 01/06/2020 1053   ALKPHOS 78 01/06/2020 1053   BILITOT 0.5 01/06/2020 1053   BILITOT 0.4 03/26/2017 0916   GFRNONAA >60 01/06/2020 1053   GFRAA >60 01/06/2020 1053     Diabetic Labs (most recent): Lab Results  Component Value Date   HGBA1C 7.7 (H) 11/30/2019   HGBA1C 7.7 (A) 10/04/2019   HGBA1C 7.7 (A) 06/02/2019     Lipid Panel ( most recent) Lipid Panel     Component Value Date/Time   CHOL 217 (H) 01/06/2020 1052   CHOL 202 (H) 03/26/2017 0916   TRIG 159 (H) 01/06/2020 1052   HDL 43 01/06/2020 1052   HDL 45 03/26/2017 0916   CHOLHDL 5.0 01/06/2020 1052   VLDL 32 01/06/2020 1052   LDLCALC 142 (H) 01/06/2020 1052   LDLCALC 134 (H) 03/26/2017 0916      Assessment & Plan:   1. Uncontrolled type 2 diabetes mellitus with hyperglycemia (  [place])  - Tara Bryant has currently uncontrolled symptomatic type 2 DM since 49 years of age.  She presents  today with her meter and logs showing above target fasting and postprandial glycemic profile overall.  Her previsit A1C was 7.7%, unchanged from last visit.  Since last visit she has had multiple health issues arise and she recently had surgery on her C-spine (currently recovering well).  She does have some mild episodes of postprandial hypoglycemia, may be associated with inadequate meal intake.  her diabetes is complicated by coronary artery disease/CHF and she remains at a high risk for more acute and chronic complications which include CAD, CVA, CKD, retinopathy, and neuropathy. These are all discussed in detail with her.  - Nutritional counseling repeated at each appointment due to patients tendency to fall back in to old habits.  - The patient admits there is a room for improvement in their diet and drink choices. -  Suggestion is made for the patient to avoid simple carbohydrates from their diet including Cakes, Sweet Desserts / Pastries, Ice Cream, Soda (diet and regular), Sweet Tea, Candies, Chips, Cookies, Sweet Pastries,  Store Bought Juices, Alcohol in Excess of  1-2 drinks a day, Artificial Sweeteners, Coffee Creamer, and "Sugar-free" Products. This will help patient to have stable blood glucose profile and potentially avoid unintended weight gain.   - I encouraged the patient to switch to  unprocessed or minimally processed complex starch and increased protein intake (animal or plant source), fruits, and vegetables.   - Patient is advised to stick to a routine mealtimes to eat 3 meals  a day and avoid unnecessary snacks ( to snack only to correct hypoglycemia).  - I have approached her with the following individualized plan to manage diabetes and patient agrees:   -Based on her recent glycemic profile, she will tolerate slight increase in her insulin.  She is advised to increase her Novolog 70/30 to 75 units with breakfast and 90 units with supper.  She is advised to continue Metformin  500 mg twice daily with meals, and Glipizide 5 mg po twice daily with meals.  -I discussed the possibility of adding incretin therapy such as Trulicity which she would benefit from, however, she would like to hold off for now and re-discuss at next visit.  -She is urged to continue monitoring blood glucose at least three times per day, before injecting insulin (at breakfast and supper) and before bed.  She is encouraged to call the clinic if she has readings less than 70 or greater than 300 for 3 tests in a row.  -She will be considered for some incretin therapy on subsequent visits. - Patient specific target  A1c;  LDL, HDL, Triglycerides,  were discussed in detail.  2) Blood Pressure /Hypertension: Her blood pressure is controlled to target.  She is advised to continue Lasix 20 mg po daily, Metoprolol 100 mg po daily, Spironolactone 25 mg daily, and Sacubitril-Valsartan 97-103 mg po twice daily.  She sees Dr. Haroldine Laws for CHF.  3) Lipids/Hyperlipidemia:  Her most recent lipid panel from 01/06/20 shows uncontrolled LDL of 142 and elevated triglycerides of 177.  She reports she quit taking her Lipitor after her surgery because she forgot about it.  I encouraged her to restart her Lipitor 10 mg po daily at bedtime.  Side effects and precautions discussed with her.  4)  Weight/Diet:  Her Body mass index is 31.14 kg/m.-she is a candidate for weight loss.  I discussed with her the  fact that loss of 5 - 10% of her  current body weight will have the most impact on her diabetes management.  CDE Consult will be initiated . Exercise, and detailed carbohydrates information provided  -  detailed on discharge instructions.  5) Chronic Care/Health Maintenance: -she  is on ACEI/ARB medications and statin medications and is encouraged to initiate and continue to follow up with Ophthalmology, Dentist,  Podiatrist at least yearly or according to recommendations, and advised to  stay away from smoking. I have  recommended yearly flu vaccine and pneumonia vaccine at least every 5 years; moderate intensity exercise for up to 150 minutes weekly; and  sleep for at least 7 hours a day.  - she is  advised to maintain close follow up with Erven Colla, DO for primary care needs, as well as her other providers for optimal and coordinated care.   - Time spent on this patient care encounter:  35 min, of which > 50% was spent in  counseling and the rest reviewing her blood glucose logs , discussing her hypoglycemia and hyperglycemia episodes, reviewing her current and  previous labs / studies  ( including abstraction from other facilities) and medications  doses and developing a  long term treatment plan and documenting her care.   Please refer to Patient Instructions for Blood Glucose Monitoring and Insulin/Medications Dosing Guide"  in media tab for additional information. Please  also refer to " Patient Self Inventory" in the Media  tab for reviewed elements of pertinent patient history.  Tara Bryant participated in the discussions, expressed understanding, and voiced agreement with the above plans.  All questions were answered to her satisfaction. she is encouraged to contact clinic should she have any questions or concerns prior to her return visit.  Follow up plan: - Return in about 4 months (around 05/15/2020) for Diabetes follow up, Previsit labs, Virtual visit ok.  Rayetta Pigg, [facility] [address] [place], [place] [address] Phone: [phone] Fax: [phone]  01/13/2020, 11:02 AM  This note was partially dictated with voice recognition software. Similar sounding words can be transcribed inadequately or may not  be corrected upon review.

## 2020-01-13 NOTE — Patient Instructions (Signed)

## 2020-01-21 ENCOUNTER — Ambulatory Visit: Payer: 59 | Admitting: Cardiology

## 2020-01-21 ENCOUNTER — Other Ambulatory Visit: Payer: Self-pay

## 2020-01-21 ENCOUNTER — Other Ambulatory Visit: Payer: Self-pay | Admitting: "Endocrinology

## 2020-01-21 DIAGNOSIS — E1165 Type 2 diabetes mellitus with hyperglycemia: Secondary | ICD-10-CM

## 2020-01-21 MED ORDER — INSULIN ASP PROT & ASP FLEXPEN (70-30) 100 UNIT/ML ~~LOC~~ SUPN: PEN_INJECTOR | SUBCUTANEOUS | 0 refills | Status: DC

## 2020-01-24 ENCOUNTER — Ambulatory Visit (INDEPENDENT_AMBULATORY_CARE_PROVIDER_SITE_OTHER): Payer: 59 | Admitting: Family Medicine

## 2020-01-24 ENCOUNTER — Encounter: Payer: Self-pay | Admitting: Family Medicine

## 2020-01-24 VITALS — HR 121 | Temp 100.5°F | Resp 20

## 2020-01-24 DIAGNOSIS — Z20822 Contact with and (suspected) exposure to covid-19: Secondary | ICD-10-CM

## 2020-01-24 DIAGNOSIS — R059 Cough, unspecified: Secondary | ICD-10-CM | POA: Diagnosis not present

## 2020-01-24 DIAGNOSIS — M545 Low back pain, unspecified: Secondary | ICD-10-CM | POA: Diagnosis not present

## 2020-01-24 NOTE — Progress Notes (Signed)
[name], female    DOB: Jun 23, 1970, 49 y.o.   MRN: [id number]   Chief Complaint  Patient presents with  . Cough   Subjective:    HPI Pt began with cough 3 days ago. Then 2 days ago had sweat, possible temp, no energy, coughing up brownish congestion, and fatigued. Pt states highest temp was 99.5F. Pt also having low back and side pain. Daughter tested positive for covid on 5 days ago via home test and then next day via rapid and was positive.  Has not taken any medications for this.  Has rt mid back pain. No dysuria, hematuria, frequency or urgency.  Has been laying down more due to the uri symptoms.  No trauma or injury to the back.   Medical History Mignon has a past medical history of AICD (automatic cardioverter/defibrillator) present, Arrhythmia, Arthritis, CAD (coronary artery disease), Chronic combined systolic (congestive) and diastolic (congestive) heart failure ([place]), Depression, Diabetes mellitus, GERD (gastroesophageal reflux disease), Hypertension, Microproteinuria, S/P thyroid biopsy (2020), and Sleep apnea.   Outpatient Encounter Medications as of 01/24/2020  Medication Sig  . apixaban (ELIQUIS) 5 MG TABS tablet Take 1 tablet (5 mg total) by mouth 2 (two) times daily.  Marland Kitchen atorvastatin (LIPITOR) 10 MG tablet Take 1 tablet (10 mg total) by mouth daily.  . B-D ULTRAFINE III SHORT PEN 31G X 8 MM MISC USE FOR INSULIN INJECTIONS TWICE DAILY  . Blood Glucose Monitoring Suppl (ACCU-CHEK GUIDE) w/Device KIT 1 Piece by Does not apply route as directed.  . furosemide (LASIX) 40 MG tablet Take  1/2 tablets daily (60 mg)   May take additional 20 mg daily for Shortness of breath (Patient taking differently: Take 60 mg by mouth daily. May take additional 20 mg daily for Shortness of breath)  . glipiZIDE (GLUCOTROL) 5 MG tablet TAKE 1 TABLET(5 MG) BY MOUTH TWICE DAILY BEFORE A MEAL  . glucose blood test strip 1 each by Other route in the morning, at noon, in the  evening, and at bedtime. Use as instructed to check blood sugar four times daily  . HYDROcodone-acetaminophen (NORCO/VICODIN) 5-325 MG tablet Take 1-2 tablets by mouth every 4 (four) hours as needed for moderate pain ((score 4 to 6)).  . Insulin Aspart Prot & Aspart (INSULIN ASP PROT & ASP FLEXPEN) (70-30) 100 UNIT/ML SUPN INJECT 70 TO 90 UNITS INTO THE SKIN TWICE DAILY WITH A MEAL. WHEN GLUCOSE IS ABOVE 90 AND YOU ARE EATING  . Lancets (ONETOUCH DELICA PLUS [id number]) MISC USE TO TEST BLOOD SUGAR FOUR TIMES DAILY  . metFORMIN (GLUCOPHAGE) 500 MG tablet TAKE 1 TABLET(500 MG) BY MOUTH TWICE DAILY WITH A MEAL (Patient taking differently: Take 500 mg by mouth in the morning and at bedtime. )  . methocarbamol (ROBAXIN) 500 MG tablet Take 1 tablet (500 mg total) by mouth every 6 (six) hours as needed for muscle spasms.  . metoprolol succinate (TOPROL-XL) 100 MG 24 hr tablet Take 1&1/2 tablets (150 mg) by mouth daily. Take with or immediately following a meal. (Patient taking differently: Take 150 mg by mouth daily. Take 1&1/2 tablets (150 mg) by mouth daily. Take with or immediately following a meal.)  . NORLYDA 0.35 MG tablet Take 1 tablet by mouth at bedtime.   . sacubitril-valsartan (ENTRESTO) 97-103 MG Take 1 tablet by mouth 2 (two) times daily.  Marland Kitchen spironolactone (ALDACTONE) 25 MG tablet Take 1 tablet (25 mg total) by mouth daily.  Marland Kitchen tiZANidine (ZANAFLEX) 2 MG tablet  Take 1 tablet (2 mg total) by mouth every 8 (eight) hours as needed for muscle spasms (caution causes drowsiness).  . trolamine salicylate (ASPERCREME) 10 % cream Apply 1 application topically 4 (four) times daily as needed for muscle pain.   No facility-administered encounter medications on file as of 01/24/2020.     Review of Systems  Constitutional: Positive for fatigue and fever. Negative for chills.  HENT: Positive for congestion. Negative for ear pain, rhinorrhea, sinus pressure, sinus pain and sore throat.   Eyes: Negative for  pain, discharge and itching.  Respiratory: Positive for cough. Negative for shortness of breath and wheezing.   Gastrointestinal: Negative for constipation, diarrhea, nausea and vomiting.  Genitourinary: Negative for dysuria, frequency, hematuria and urgency.  Musculoskeletal: Positive for back pain.     Vitals Pulse (!) 121   Temp (!) 100.5 F (38.1 C) (Temporal)   Resp 20   SpO2 99%   Objective:   Physical Exam Vitals and nursing note reviewed.  Constitutional:      General: She is not in acute distress.    Appearance: Normal appearance. She is not toxic-appearing.  HENT:     Head: Normocephalic and atraumatic.     Nose: Nose normal. No congestion or rhinorrhea.     Mouth/Throat:     Mouth: Mucous membranes are moist.     Pharynx: Oropharynx is clear. No oropharyngeal exudate or posterior oropharyngeal erythema.  Eyes:     Extraocular Movements: Extraocular movements intact.     Conjunctiva/sclera: Conjunctivae normal.     Pupils: Pupils are equal, round, and reactive to light.  Cardiovascular:     Rate and Rhythm: Regular rhythm. Tachycardia present.     Pulses: Normal pulses.     Heart sounds: Normal heart sounds. No murmur heard.   Pulmonary:     Effort: Pulmonary effort is normal. No respiratory distress.     Breath sounds: No wheezing, rhonchi or rales.  Musculoskeletal:        General: Normal range of motion.     Cervical back: Normal range of motion.     Comments: +rt low back ttp.  Normal rom.  No rash.   Lymphadenopathy:     Cervical: No cervical adenopathy.  Skin:    General: Skin is warm and dry.     Findings: No rash.  Neurological:     General: No focal deficit present.     Mental Status: She is alert and oriented to person, place, and time.     Gait: Gait normal.  Psychiatric:        Behavior: Behavior normal.     Comments: +anxious mood and affect      Assessment and Plan   1. Cough - Novel Coronavirus, NAA ([facility])  2. Close exposure  to COVID-19 virus  3. Acute right-sided low back pain without sciatica   Likely covid illness. Pt close contact over last few days with daughter with positive covid and symptoms. covid test-pending.  Symptomatic treatment, flonase, mucinex, increase fluids. Tylenol prn. Vit c, d, zinc. Call if sob, worsening coughing, hematuria/dysuria, or persistent fever.  Tachycardic, with low grade temp. Advising pt to check hr at home and call if seeing numbers consistently over 110.  Pt very nervous on exam.  Not feeling sob, chest pain. On review of HR, over past year has ranged from upper 80s to 112 bpm at times. Mostly HR in the upper 90s.  Rt lower back pain- tylenol, heat, ice. Rest. Call if  pain worsening.  Will call with results.  Pt in agreement with plan.  F/u prn.

## 2020-01-25 ENCOUNTER — Telehealth: Payer: Self-pay

## 2020-01-25 LAB — NOVEL CORONAVIRUS, NAA: SARS-CoV-2, NAA: DETECTED — AB

## 2020-01-25 LAB — SARS-COV-2, NAA 2 DAY TAT

## 2020-01-25 LAB — SPECIMEN STATUS REPORT

## 2020-01-25 NOTE — Telephone Encounter (Signed)
Yes, please put pt on the infusion hotline. Thx. Dr. Lovena Le

## 2020-01-25 NOTE — Telephone Encounter (Signed)
Pt has called back wanting to be referred for infusion clinic   Pt call back [phone]

## 2020-01-25 NOTE — Telephone Encounter (Signed)
Please advise. Thank you

## 2020-01-26 ENCOUNTER — Other Ambulatory Visit: Payer: Self-pay | Admitting: Physician Assistant

## 2020-01-26 DIAGNOSIS — I5022 Chronic systolic (congestive) heart failure: Secondary | ICD-10-CM

## 2020-01-26 DIAGNOSIS — I2510 Atherosclerotic heart disease of native coronary artery without angina pectoris: Secondary | ICD-10-CM

## 2020-01-26 DIAGNOSIS — G4739 Other sleep apnea: Secondary | ICD-10-CM

## 2020-01-26 DIAGNOSIS — I10 Essential (primary) hypertension: Secondary | ICD-10-CM

## 2020-01-26 DIAGNOSIS — Z794 Long term (current) use of insulin: Secondary | ICD-10-CM

## 2020-01-26 DIAGNOSIS — U071 COVID-19: Secondary | ICD-10-CM

## 2020-01-26 DIAGNOSIS — E119 Type 2 diabetes mellitus without complications: Secondary | ICD-10-CM

## 2020-01-26 NOTE — Progress Notes (Signed)
I connected by phone with Tara Bryant on 01/26/2020 at 11:34 AM to discuss the potential use of a new treatment for mild to moderate COVID-19 viral infection in non-hospitalized patients.  This patient is a 49 y.o. female that meets the FDA criteria for Emergency Use Authorization of COVID monoclonal antibody casirivimab/imdevimab or bamlanivimab/eteseviamb.  Has a (+) direct SARS-CoV-2 viral test result  Has mild or moderate COVID-19   Is NOT hospitalized due to COVID-19  Is within 10 days of symptom onset  Has at least one of the high risk factor(s) for progression to severe COVID-19 and/or hospitalization as defined in EUA.  Specific high risk criteria : BMI > 25, Diabetes and Cardiovascular disease or hypertension   I have spoken and communicated the following to the patient or parent/caregiver regarding COVID monoclonal antibody treatment:  1. FDA has authorized the emergency use for the treatment of mild to moderate COVID-19 in adults and pediatric patients with positive results of direct SARS-CoV-2 viral testing who are 46 years of age and older weighing at least 40 kg, and who are at high risk for progressing to severe COVID-19 and/or hospitalization.  2. The significant known and potential risks and benefits of COVID monoclonal antibody, and the extent to which such potential risks and benefits are unknown.  3. Information on available alternative treatments and the risks and benefits of those alternatives, including clinical trials.  4. Patients treated with COVID monoclonal antibody should continue to self-isolate and use infection control measures (e.g., wear mask, isolate, social distance, avoid sharing personal items, clean and disinfect "high touch" surfaces, and frequent handwashing) according to CDC guidelines.   5. The patient or parent/caregiver has the option to accept or refuse COVID monoclonal antibody treatment.  After reviewing this information with the  patient, the patient has agreed to receive one of the available covid 19 monoclonal antibodies and will be provided an appropriate fact sheet prior to infusion.   Tara Bryant, [place] 01/26/2020 11:34 AM

## 2020-01-26 NOTE — Telephone Encounter (Signed)
Info given to infusion center.

## 2020-01-27 ENCOUNTER — Ambulatory Visit (HOSPITAL_COMMUNITY)
Admission: RE | Admit: 2020-01-27 | Discharge: 2020-01-27 | Disposition: A | Discharge status: Home / Self Care | Payer: 59 | Source: Ambulatory Visit | Attending: Pulmonary Disease | Admitting: Pulmonary Disease

## 2020-01-27 ENCOUNTER — Telehealth (HOSPITAL_COMMUNITY): Payer: Self-pay

## 2020-01-27 DIAGNOSIS — U071 COVID-19: Secondary | ICD-10-CM | POA: Insufficient documentation

## 2020-01-27 MED ORDER — DIPHENHYDRAMINE HCL 50 MG/ML IJ SOLN: 50.0000 mg | Freq: Once | INTRAMUSCULAR | Status: DC | PRN

## 2020-01-27 MED ORDER — EPINEPHRINE 0.3 MG/0.3ML IJ SOAJ: 0.3000 mg | Freq: Once | INTRAMUSCULAR | Status: DC | PRN

## 2020-01-27 MED ORDER — ALBUTEROL SULFATE HFA 108 (90 BASE) MCG/ACT IN AERS: 2.0000 | INHALATION_SPRAY | Freq: Once | RESPIRATORY_TRACT | Status: DC | PRN

## 2020-01-27 MED ORDER — METHYLPREDNISOLONE SODIUM SUCC 125 MG IJ SOLR: 125.0000 mg | Freq: Once | INTRAMUSCULAR | Status: DC | PRN

## 2020-01-27 MED ORDER — SODIUM CHLORIDE 0.9 % IV SOLN: INTRAVENOUS | Status: DC | PRN

## 2020-01-27 MED ORDER — ONDANSETRON HCL 4 MG/2ML IJ SOLN
4.0000 mg | Freq: Once | INTRAMUSCULAR | Status: AC
  Administered 2020-01-27: 4 mg via INTRAVENOUS
  Filled 2020-01-27: qty 2

## 2020-01-27 MED ORDER — FAMOTIDINE IN NACL 20-0.9 MG/50ML-% IV SOLN: 20.0000 mg | Freq: Once | INTRAVENOUS | Status: DC | PRN

## 2020-01-27 MED ORDER — SODIUM CHLORIDE 0.9 % IV SOLN: Freq: Once | INTRAVENOUS | Status: AC

## 2020-01-27 NOTE — Discharge Instructions (Signed)

## 2020-01-27 NOTE — Progress Notes (Signed)
  Diagnosis: COVID-19  Physician: Dr. Wright  Procedure: Covid Infusion Clinic Med: bamlanivimab\etesevimab infusion - Provided patient with bamlanimivab\etesevimab fact sheet for patients, parents and caregivers prior to infusion.  Complications: No immediate complications noted.  Discharge: Discharged home   Tara Bryant J Tara Bryant 01/27/2020  

## 2020-01-27 NOTE — Telephone Encounter (Signed)
Called to Discuss with patient about Covid symptoms and the use of the monoclonal antibody infusion for those with mild to moderate Covid symptoms and at a high risk of hospitalization.     Pt appears to qualify for this infusion due to co-morbid conditions and/or a member of an at-risk group in accordance with the FDA Emergency Use Authorization.    Unable to reach pt, HIPPA complaint message left.

## 2020-01-31 ENCOUNTER — Telehealth: Payer: Self-pay

## 2020-01-31 MED ORDER — AMOXICILLIN 500 MG PO CAPS: 500.0000 mg | ORAL_CAPSULE | Freq: Three times a day (TID) | ORAL | 0 refills | Status: DC

## 2020-01-31 NOTE — Telephone Encounter (Signed)
Any fever, sinus pain in face, sinus drainage from nose?  Pls send pt for xray of the chest.   Thanks,   Dr. Lovena Le

## 2020-01-31 NOTE — Telephone Encounter (Signed)
Patient saw Dr. Lovena Le last week-Covid positive- Had Infusion on Thursday.  Patient is having a lot of congestion, dark brown and tinged with blood.  She thinks she has a sinus infection and wanted to know if we would call in an antibiotic.  Feeling ok, very fatigued, no fevers and no sob.   [facility] on [address]

## 2020-01-31 NOTE — Telephone Encounter (Signed)
Patient states no fever and no SOB but having a lot of nasal congestion and blood tinged and discolored and fatigued. Patient thinks she has developed a sinus infection.

## 2020-01-31 NOTE — Telephone Encounter (Signed)
Patient notified and verbalized understanding. 

## 2020-02-02 ENCOUNTER — Other Ambulatory Visit: Payer: Self-pay

## 2020-02-03 ENCOUNTER — Telehealth: Payer: Self-pay

## 2020-02-03 NOTE — Telephone Encounter (Signed)
Needs appt. Dr. Lovena Le

## 2020-02-03 NOTE — Telephone Encounter (Signed)
Please advise. Thank you

## 2020-02-03 NOTE — Telephone Encounter (Signed)
Patient was seen 10/18 and put on antibiotic. She states still not feeling well  With low grade fever runny nose ,coughing up cold. She states discuss X-ray of her lungs. She has flu shot on 11/11 will she and daughter be able to get this. Please advise

## 2020-02-04 ENCOUNTER — Other Ambulatory Visit: Payer: Self-pay | Admitting: "Endocrinology

## 2020-02-04 ENCOUNTER — Ambulatory Visit (INDEPENDENT_AMBULATORY_CARE_PROVIDER_SITE_OTHER): Payer: 59

## 2020-02-04 DIAGNOSIS — I420 Dilated cardiomyopathy: Secondary | ICD-10-CM | POA: Diagnosis not present

## 2020-02-04 LAB — CUP PACEART REMOTE DEVICE CHECK
Battery Remaining Longevity: 174 mo
Battery Remaining Percentage: 100 %
Brady Statistic RV Percent Paced: 0 %
Date Time Interrogation Session: 20211029044100
HighPow Impedance: 78 Ohm
Implantable Lead Implant Date: 20200723
Implantable Lead Location: 753860
Implantable Lead Model: 293
Implantable Lead Serial Number: 443823
Implantable Pulse Generator Implant Date: 20200723
Lead Channel Impedance Value: 484 Ohm
Lead Channel Setting Pacing Amplitude: 3.5 V
Lead Channel Setting Pacing Pulse Width: 0.4 ms
Lead Channel Setting Sensing Sensitivity: 0.5 mV
Pulse Gen Serial Number: 256025

## 2020-02-04 NOTE — Telephone Encounter (Signed)
Please contact patient to have her set up tent visit. Thank you

## 2020-02-07 ENCOUNTER — Other Ambulatory Visit: Payer: Self-pay

## 2020-02-07 ENCOUNTER — Ambulatory Visit (INDEPENDENT_AMBULATORY_CARE_PROVIDER_SITE_OTHER): Payer: 59 | Admitting: Family Medicine

## 2020-02-07 ENCOUNTER — Encounter: Payer: Self-pay | Admitting: Family Medicine

## 2020-02-07 VITALS — BP 110/70 | HR 96 | Temp 97.8°F | Ht 67.5 in | Wt 200.0 lb

## 2020-02-07 DIAGNOSIS — R053 Chronic cough: Secondary | ICD-10-CM

## 2020-02-07 DIAGNOSIS — U099 Post covid-19 condition, unspecified: Secondary | ICD-10-CM | POA: Diagnosis not present

## 2020-02-07 MED ORDER — BENZONATATE 100 MG PO CAPS: 100.0000 mg | ORAL_CAPSULE | Freq: Two times a day (BID) | ORAL | 0 refills | Status: DC | PRN

## 2020-02-07 NOTE — Telephone Encounter (Signed)
Has appointment today

## 2020-02-07 NOTE — Patient Instructions (Signed)
Benzonatate capsules What is this medicine? BENZONATATE (ben ZOE na tate) is used to treat cough. This medicine may be used for other purposes; ask your health care provider or pharmacist if you have questions. COMMON BRAND NAME(S): Tessalon Perles, Zonatuss What should I tell my health care provider before I take this medicine? They need to know if you have any of these conditions:  kidney or liver disease  an unusual or allergic reaction to benzonatate, anesthetics, other medicines, foods, dyes, or preservatives  pregnant or trying to get pregnant  breast-feeding How should I use this medicine? Take this medicine by mouth with a glass of water. Follow the directions on the prescription label. Avoid breaking, chewing, or sucking the capsule, as this can cause serious side effects. Take your medicine at regular intervals. Do not take your medicine more often than directed. Talk to your pediatrician regarding the use of this medicine in children. While this drug may be prescribed for children as young as 14 years old for selected conditions, precautions do apply. Overdosage: If you think you have taken too much of this medicine contact a poison control center or emergency room at once. NOTE: This medicine is only for you. Do not share this medicine with others. What if I miss a dose? If you miss a dose, take it as soon as you can. If it is almost time for your next dose, take only that dose. Do not take double or extra doses. What may interact with this medicine? Do not take this medicine with any of the following medications:  MAOIs like Carbex, Eldepryl, Marplan, Nardil, and Parnate This list may not describe all possible interactions. Give your health care provider a list of all the medicines, herbs, non-prescription drugs, or dietary supplements you use. Also tell them if you smoke, drink alcohol, or use illegal drugs. Some items may interact with your medicine. What should I watch for  while using this medicine? Tell your doctor if your symptoms do not improve or if they get worse. If you have a high fever, skin rash, or headache, see your health care professional. You may get drowsy or dizzy. Do not drive, use machinery, or do anything that needs mental alertness until you know how this medicine affects you. Do not sit or stand up quickly, especially if you are an older patient. This reduces the risk of dizzy or fainting spells. What side effects may I notice from receiving this medicine? Side effects that you should report to your doctor or health care professional as soon as possible:  allergic reactions like skin rash, itching or hives, swelling of the face, lips, or tongue  breathing problems  chest pain  confusion or hallucinations  irregular heartbeat  numbness of mouth or throat  seizures Side effects that usually do not require medical attention (report to your doctor or health care professional if they continue or are bothersome):  burning feeling in the eyes  constipation  headache  nasal congestion  stomach upset This list may not describe all possible side effects. Call your doctor for medical advice about side effects. You may report side effects to FDA at 1-800-FDA-1088. Where should I keep my medicine? Keep out of the reach of children. Store at room temperature between 15 and 30 degrees C (59 and 86 degrees F). Keep tightly closed. Protect from light and moisture. Throw away any unused medicine after the expiration date. NOTE: This sheet is a summary. It may not cover all possible information.  If you have questions about this medicine, talk to your doctor, pharmacist, or health care provider.  2020 Elsevier/Gold Standard (2007-06-24 14:52:56)

## 2020-02-07 NOTE — Progress Notes (Signed)
[name], female    DOB: 03-Jun-1970, 49 y.o.   MRN: [id number]   Chief Complaint  Patient presents with  . Covid Positive   Subjective:  CC: post covid infection follow-up  Presents today for a post Covid follow-up.  She tested positive on October 18.  Symptoms started on October 15 with coughing.  She is 17 days post symptoms.  On October 21 she received a Covid infusion, she is concerned as she expected to feel much better by today.  On October 25, she was started on antibiotics for sinus infection.  She is still coughing and still congested just not "feeling herself ". patient has a history of heart failure with a low EF. tested positive on oct 18th for covid. Had infusion on the 21st. Still coughing and feeling tired. Taking amoxil.    Medical History Yer has a past medical history of AICD (automatic cardioverter/defibrillator) present, Arrhythmia, Arthritis, CAD (coronary artery disease), Chronic combined systolic (congestive) and diastolic (congestive) heart failure ([place]), Depression, Diabetes mellitus, GERD (gastroesophageal reflux disease), Hypertension, Microproteinuria, S/P thyroid biopsy (2020), and Sleep apnea.   Outpatient Encounter Medications as of 02/07/2020  Medication Sig  . amoxicillin (AMOXIL) 500 MG capsule Take 1 capsule (500 mg total) by mouth 3 (three) times daily.  Marland Kitchen apixaban (ELIQUIS) 5 MG TABS tablet Take 1 tablet (5 mg total) by mouth 2 (two) times daily.  Marland Kitchen atorvastatin (LIPITOR) 10 MG tablet Take 1 tablet (10 mg total) by mouth daily.  . B-D ULTRAFINE III SHORT PEN 31G X 8 MM MISC USE FOR INSULIN INJECTIONS TWICE DAILY  . Blood Glucose Monitoring Suppl (ACCU-CHEK GUIDE) w/Device KIT 1 Piece by Does not apply route as directed.  . furosemide (LASIX) 40 MG tablet Take  1/2 tablets daily (60 mg)   May take additional 20 mg daily for Shortness of breath (Patient taking differently: Take 60 mg by mouth daily. May take additional 20 mg daily for  Shortness of breath)  . glipiZIDE (GLUCOTROL) 5 MG tablet TAKE 1 TABLET(5 MG) BY MOUTH TWICE DAILY BEFORE A MEAL  . HYDROcodone-acetaminophen (NORCO/VICODIN) 5-325 MG tablet Take 1-2 tablets by mouth every 4 (four) hours as needed for moderate pain ((score 4 to 6)).  . Insulin Aspart Prot & Aspart (INSULIN ASP PROT & ASP FLEXPEN) (70-30) 100 UNIT/ML SUPN INJECT 70 TO 90 UNITS INTO THE SKIN TWICE DAILY WITH A MEAL. WHEN GLUCOSE IS ABOVE 90 AND YOU ARE EATING  . Lancets (ONETOUCH DELICA PLUS [id number]) MISC USE TO TEST BLOOD SUGAR FOUR TIMES DAILY  . metFORMIN (GLUCOPHAGE) 500 MG tablet TAKE 1 TABLET(500 MG) BY MOUTH TWICE DAILY WITH A MEAL (Patient taking differently: Take 500 mg by mouth in the morning and at bedtime. )  . methocarbamol (ROBAXIN) 500 MG tablet Take 1 tablet (500 mg total) by mouth every 6 (six) hours as needed for muscle spasms.  . metoprolol succinate (TOPROL-XL) 100 MG 24 hr tablet Take 1&1/2 tablets (150 mg) by mouth daily. Take with or immediately following a meal. (Patient taking differently: Take 150 mg by mouth daily. Take 1&1/2 tablets (150 mg) by mouth daily. Take with or immediately following a meal.)  . NORLYDA 0.35 MG tablet Take 1 tablet by mouth at bedtime.   Glory Rosebush ULTRA test strip USE TO CHECK BLOOD SUGAR EVERY MORNING, AT NOON, IN THE EVENING, EVERY NIGHT AT BEDTIME  . sacubitril-valsartan (ENTRESTO) 97-103 MG Take 1 tablet by mouth 2 (two) times daily.  Marland Kitchen spironolactone (  ALDACTONE) 25 MG tablet Take 1 tablet (25 mg total) by mouth daily.  Marland Kitchen tiZANidine (ZANAFLEX) 2 MG tablet Take 1 tablet (2 mg total) by mouth every 8 (eight) hours as needed for muscle spasms (caution causes drowsiness).  . trolamine salicylate (ASPERCREME) 10 % cream Apply 1 application topically 4 (four) times daily as needed for muscle pain.  . benzonatate (TESSALON) 100 MG capsule Take 1 capsule (100 mg total) by mouth 2 (two) times daily as needed for cough.   No facility-administered  encounter medications on file as of 02/07/2020.     Review of Systems  Constitutional: Positive for fatigue. Negative for chills and fever.  HENT: Positive for congestion. Negative for sinus pressure and sinus pain.   Respiratory: Positive for cough. Negative for shortness of breath.   Cardiovascular: Negative for chest pain.  Gastrointestinal: Negative for abdominal pain.     Vitals BP 110/70   Pulse 96   Temp 97.8 F (36.6 C)   Ht 5' 7.5" (1.715 m)   Wt 200 lb (90.7 kg)   SpO2 100%   BMI 30.86 kg/m   Objective:   Physical Exam Vitals and nursing note reviewed.  Constitutional:      General: She is not in acute distress.    Appearance: Normal appearance. She is not ill-appearing.  HENT:     Right Ear: Tympanic membrane normal.     Left Ear: Tympanic membrane normal.     Nose: Nose normal.     Mouth/Throat:     Mouth: Mucous membranes are moist.     Pharynx: Oropharynx is clear.  Cardiovascular:     Rate and Rhythm: Normal rate and regular rhythm.     Heart sounds: Normal heart sounds.  Pulmonary:     Effort: Pulmonary effort is normal.     Breath sounds: Normal breath sounds.  Skin:    General: Skin is warm and dry.  Neurological:     Mental Status: She is alert and oriented to person, place, and time.  Psychiatric:        Mood and Affect: Mood normal.        Behavior: Behavior normal.        Thought Content: Thought content normal.        Judgment: Judgment normal.      Assessment and Plan   1. Post-COVID chronic cough - benzonatate (TESSALON) 100 MG capsule; Take 1 capsule (100 mg total) by mouth 2 (two) times daily as needed for cough.  Dispense: 20 capsule; Refill: 0   Patient is concerned that she may not have fully recovered from Covid.  She is able to converse with me, with no shortness of breath, no difficulty completing a sentence, her vital signs today are great.  Her oxygen saturation is 100%.  Her lungs are perfectly clear.  She is still  coughing, medication sent to the pharmacy to help with the symptom.  Explained that it could take a few weeks to completely recover from Covid.  I am encouraged by how well she is doing today.  She has a cardiology follow-up this Thursday for her heart failure.  Her lungs are clear she has no peripheral edema.  I tried to reassure her of her progress.  Agrees with plan of care discussed today. Understands warning signs to seek further care: Fever, chest pain, shortness of breath, any significant changes in health status. Understands to follow-up if she does not continue to improve.  It is encouraging how well  she looks today status post Covid infection with her chronic health conditions.  Pecolia Ades, FNP-C 02/07/2020

## 2020-02-08 NOTE — Progress Notes (Signed)
Remote ICD transmission.   

## 2020-02-09 ENCOUNTER — Other Ambulatory Visit: Payer: Self-pay

## 2020-02-09 ENCOUNTER — Telehealth: Payer: Self-pay

## 2020-02-09 DIAGNOSIS — E1165 Type 2 diabetes mellitus with hyperglycemia: Secondary | ICD-10-CM

## 2020-02-09 MED ORDER — INSULIN ASP PROT & ASP FLEXPEN (70-30) 100 UNIT/ML ~~LOC~~ SUPN: PEN_INJECTOR | SUBCUTANEOUS | 0 refills | Status: DC

## 2020-02-09 NOTE — Telephone Encounter (Signed)
UPDATED AND SENT IN

## 2020-02-09 NOTE — Telephone Encounter (Signed)
Patient said that Tara Bryant changed her insulin dosage at last visit and she just wants to make sure the correct RX is at the [facility] on [address]

## 2020-02-09 NOTE — Progress Notes (Signed)
Cardiology Office Note  Date: 02/10/2020   ID: Tara Bryant, DOB 12-02-1970, MRN [id number]  PCP:  Tara Colla, DO  Cardiologist:  Tara Dolly, MD Electrophysiologist:  None   Chief Complaint: Follow-up chronic systolic heart failure  History of Present Illness: Tara Bryant is a 49 y.o. female with a history of chronic systolic heart failure, AICD, CAD, lower extremity pain, noncompaction cardiomyopathy, diabetes, GERD, hypertension, OSA, pulmonary hypertension.  Last encounter with Dr. Harl Bryant 09/07/2019.  Having occasional shortness of breath at times.  Some Bryant edema at times.  Feelings of abdominal distention.  Intermittent dizzy spells when bending over or standing up.  Medical therapy was limited by intermittent dizziness.  No plans to titrate medications at that time.  She was asked to check her sugars the next time she had an episode of dizziness.  She was counseled to take her Lasix 60 mg daily with an additional 20 mg as needed for swelling or shortness of breath.  She was referred to cardiac rehab.  She was encouraged to discuss alternative causes of leg pain with her PCP.  If negative work-up could reconsider ABIs.  Moderate nonobstructive CAD by cath.  No recent chest pain.  She needed follow-up to resolve need for CPAP machine for her OSA.  She had an upcoming appointment with Dr. Elsworth Bryant to address and help her arrange CPAP.  Pain in bilateral lower extremities anteriorly with standing long periods of time.  She is here for follow-up today.  In the interim since last visit she has had cervical neck surgery and Covid infection.  She denies any issues other than some mild dizziness at times but no presyncopal or syncopal episodes.  Denies any significant shortness of breath or exertional angina.  No palpitations or arrhythmias, CVA or TIA-like symptoms, PND, orthopnea, bleeding, claudication-like symptoms but does have sciatic pain.  She will be following up with Dr.  For possible MRI of her lumbar spine.  She admits to skipping some of her Lasix doses recently.  She admits sometimes she does not adhere to medication regimen regarding the Lasix.  She follows with Dr. Haroldine Bryant at heart failure clinic.  She sees Dr. Lovena Bryant for AICD checks.  Past Medical History:  Diagnosis Date   AICD (automatic cardioverter/defibrillator) present    Arrhythmia    Arthritis    CAD (coronary artery disease)    a. 03/2018: cath showing 60% mid-LAD stenosis not significant by FFR.    Chronic combined systolic (congestive) and diastolic (congestive) heart failure ([place])    a. 03/2018: found to have a newly reduced EF of 20% by echocardiogram   Depression    denies   Diabetes mellitus    GERD (gastroesophageal reflux disease)    h/o of   Hypertension    Microproteinuria    S/P thyroid biopsy 2020   follow up once a year,    Sleep apnea     Past Surgical History:  Procedure Laterality Date   ANTERIOR CERVICAL DECOMP/DISCECTOMY FUSION N/A 12/03/2019   Procedure: ANTERIOR CERVICAL DECOMPRESSION/DISCECTOMY FUSION CERVICAL SIX- CERVICAL SEVEN;  Surgeon: Consuella Lose, MD;  Location: [facility];  Service: Neurosurgery;  Laterality: N/A;  ANTERIOR CERVICAL DECOMPRESSION/DISCECTOMY FUSION CERVICAL SIX- CERVICAL SEVEN   BACK SURGERY  2010   BREAST BIOPSY Left 2017   benign   CARDIAC CATHETERIZATION     ICD IMPLANT N/A 10/29/2018   Procedure: ICD IMPLANT;  Surgeon: Evans Lance, MD;  Location: [facility];  Service: Cardiovascular;  Laterality: N/A;   INTRAVASCULAR PRESSURE WIRE/FFR STUDY N/A 03/24/2018   Procedure: INTRAVASCULAR PRESSURE WIRE/FFR STUDY;  Surgeon: Leonie Man, MD;  Location: [place] [facility];  Service: Cardiovascular;  Laterality: N/A;   NOSE SURGERY     RIGHT/LEFT HEART CATH AND CORONARY ANGIOGRAPHY N/A 03/24/2018   Procedure: RIGHT/LEFT HEART CATH AND CORONARY ANGIOGRAPHY;  Surgeon: Leonie Man, MD;  Location: [place] [facility];  Service: Cardiovascular;  Laterality: N/A;   TONSILLECTOMY     WISDOM TOOTH EXTRACTION      Current Outpatient Medications  Medication Sig Dispense Refill   amoxicillin (AMOXIL) 500 MG capsule Take 1 capsule (500 mg total) by mouth 3 (three) times daily. 30 capsule 0   apixaban (ELIQUIS) 5 MG TABS tablet Take 1 tablet (5 mg total) by mouth 2 (two) times daily. 180 tablet 3   atorvastatin (LIPITOR) 10 MG tablet Take 1 tablet (10 mg total) by mouth daily. 90 tablet 1   B-D ULTRAFINE III SHORT PEN 31G X 8 MM MISC USE FOR INSULIN INJECTIONS TWICE DAILY 100 each 3   benzonatate (TESSALON) 100 MG capsule Take 1 capsule (100 mg total) by mouth 2 (two) times daily as needed for cough. 20 capsule 0   Blood Glucose Monitoring Suppl (ACCU-CHEK GUIDE) w/Device KIT 1 Piece by Does not apply route as directed. 1 kit 0   furosemide (LASIX) 40 MG tablet Take  1/2 tablets daily (60 mg)   May take additional 20 mg daily for Shortness of breath (Patient taking differently: Take 60 mg by mouth daily. May take additional 20 mg daily for Shortness of breath) 100 tablet 3   glipiZIDE (GLUCOTROL) 5 MG tablet TAKE 1 TABLET(5 MG) BY MOUTH TWICE DAILY BEFORE A MEAL 180 tablet 0   Insulin Aspart Prot & Aspart (INSULIN ASP PROT & ASP FLEXPEN) (70-30) 100 UNIT/ML SUPN INJECT 75 UNITS AT BREAKFAST AND 90 UNITS AT SUPPER INTO THE SKIN WHEN GLUCOSE IS ABOVE 90 AND YOU ARE EATING 30 mL 0   Lancets (ONETOUCH DELICA PLUS [id number]) MISC USE TO TEST BLOOD SUGAR FOUR TIMES DAILY     metFORMIN (GLUCOPHAGE) 500 MG tablet TAKE 1 TABLET(500 MG) BY MOUTH TWICE DAILY WITH A MEAL (Patient taking differently: Take 500 mg by mouth in the morning and at bedtime. ) 180 tablet 1   metoprolol succinate (TOPROL-XL) 100 MG 24 hr tablet Take 1&1/2 tablets (150 mg) by mouth daily. Take with or immediately following a meal. (Patient taking differently: Take 150 mg by mouth daily. Take 1&1/2 tablets (150 mg) by mouth  daily. Take with or immediately following a meal.) 135 tablet 1   NORLYDA 0.35 MG tablet Take 1 tablet by mouth at bedtime.   12   ONETOUCH ULTRA test strip USE TO CHECK BLOOD SUGAR EVERY MORNING, AT NOON, IN THE EVENING, EVERY NIGHT AT BEDTIME 200 strip 2   sacubitril-valsartan (ENTRESTO) 97-103 MG Take 1 tablet by mouth 2 (two) times daily. 60 tablet 5   spironolactone (ALDACTONE) 25 MG tablet Take 1 tablet (25 mg total) by mouth daily. 90 tablet 3   No current facility-administered medications for this visit.   Allergies:  Aspartame and phenylalanine   Social History: The patient  reports that she has never smoked. She has never used smokeless tobacco. She reports that she does not drink alcohol and does not use drugs.   Family History: The patient's family history includes Breast cancer in her mother and sister; Cancer in  her mother; Chronic Renal Failure in her father; Diabetes in an other family member; Heart attack in an other family member; Hypertension in her mother; Stroke (age of onset: 75) in her sister.   ROS:  Please see the history of present illness. Otherwise, complete review of systems is positive for none.  All other systems are reviewed and negative.   Physical Exam: VS:  BP 116/70    Pulse 90    Ht 5' 7"  (1.702 m)    Wt 202 lb (91.6 kg)    SpO2 99%    BMI 31.64 kg/m , BMI Body mass index is 31.64 kg/m.  Wt Readings from Last 3 Encounters:  02/10/20 202 lb (91.6 kg)  02/07/20 200 lb (90.7 kg)  01/13/20 201 lb 12.8 oz (91.5 kg)    General: Patient appears comfortable at rest.Neck: Supple, no elevated JVP or carotid bruits, no thyromegaly. Lungs: Clear to auscultation, nonlabored breathing at rest. Cardiac: Regular rate and rhythm, no S3 or significant systolic murmur, no pericardial rub. Extremities: No pitting edema, distal pulses 2+. Skin: Warm and dry. Musculoskeletal: No kyphosis. Neuropsychiatric: Alert and oriented x3, affect grossly appropriate.  ECG:   An ECG dated 02/10/2020 was personally reviewed today and demonstrated:  Normal sinus rhythm rate of 92, T wave abnormality, consider inferior ischemia.  Recent Labwork: 03/15/2019: B Natriuretic Peptide 49.6; Magnesium 1.7 12/04/2019: Hemoglobin 12.9; Platelets 227 01/06/2020: ALT 22; AST 17; BUN 15; Creatinine, Ser 0.71; Potassium 4.1; Sodium 135; TSH 1.844     Component Value Date/Time   CHOL 217 (H) 01/06/2020 1052   CHOL 202 (H) 03/26/2017 0916   TRIG 159 (H) 01/06/2020 1052   HDL 43 01/06/2020 1052   HDL 45 03/26/2017 0916   CHOLHDL 5.0 01/06/2020 1052   VLDL 32 01/06/2020 1052   LDLCALC 142 (H) 01/06/2020 1052   LDLCALC 134 (H) 03/26/2017 0916    Other Studies Reviewed Today:  03/2018 echo LVEF 20%, diffuse hypokinesis, cannot eval diastolic function, mild RV dysfunction, PASP 47, small pericardial effusion  03/2018 LHC/RHC  Prox LAD lesion is 55% stenosed. DFR 0.92  Otherwise angiographically normal coronary arteries.  Hemodynamic findings consistent with severe pulmonary hypertension.  LV end diastolic pressure is severely elevated.  SUMMARY:  Moderate single-vessel disease with roughly 60% mid LAD (DFR 0.92 -not physiologically significant)  Otherwise minimal CAD.  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 (Output 4.48, Index 2.2.)   03/2018 cardiac MRI FINDINGS: 1. Moderately dilated left ventricle with mild concentric hypertrophy and severe systolic function (LVEF = 27%). Severe diffuse hypokinesis and paradoxical septal motion. There is no late gadolinium enhancement in the left ventricular myocardium. There is significant hyper trabeculation of the left ventricle with ratio of non-compacted to compacted myocardium > 4:1.  LVEDD: 61 mm  LVESD: 51 mm  LVEDV: 170  ml  LVESV: 124 ml  SV: 46 ml  CO: 4.6 L/min  Myocardial mass: 166 g  2. Normal right ventricular size, thickness and moderate systolic dysfunction (LVEF = 30%). There are no regional wall motion abnormalities.  3. Normal left and right atrial size.  4. Normal size of the aortic root, ascending aorta and pulmonary artery.  5. Trivial mitral and mild tricuspid regurgitation.  6. Normal pericardium. Mild circumferential pericardial effusion, moderate amount inferior to the heart (up to 11 mm).  IMPRESSION: 1. Moderately dilated left ventricle with mild concentric  hypertrophy and severe systolic function (LVEF = 27%). Severe diffuse hypokinesis and paradoxical septal motion. There is no late gadolinium enhancement in the left ventricular myocardium. There is significant hyper trabeculation of the left ventricle with ratio of non-compacted to compacted myocardium > 4:1.  2. Normal right ventricular size, thickness and moderate systolic dysfunction (LVEF = 30%). There are no regional wall motion abnormalities.  3. Normal left and right atrial size.  4. Normal size of the aortic root, ascending aorta and pulmonary artery.  5. Trivial mitral and mild tricuspid regurgitation.  6. Normal pericardium. Mild circumferential pericardial effusion, moderate amount inferior to the heart (up to 11 mm).  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   08/2018 echo IMPRESSIONS   1. The left ventricle has severely reduced systolic function, with an ejection fraction of 25-30%. The cavity size was normal. There is mild concentric left ventricular hypertrophy. Left ventricular diastolic Doppler parameters are consistent with  pseudonormalization. Elevated left ventricular end-diastolic pressure Left ventricular diffuse hypokinesis. 2. The left ventricular apex appears trabeculated. 3. The  right ventricle has mildly reduced systolic function. The cavity was normal. There is mildly increased right ventricular wall thickness. 4. The mitral valve is grossly normal. Mild thickening of the mitral valve leaflet. Mitral valve regurgitation is mild to moderate by color flow Doppler. 5. The tricuspid valve is grossly normal. 6. The aortic valve is grossly normal. Mild thickening of the aortic valve. 7. The aortic root is normal in size and structure.   01/2019 IMPRESSIONS 1.Moderate obstructive sleep apnea syndrome worse during REM sleep is noted with this recording. AutoPap 5-12 as recommended.   Assessment and Plan:  1. Chronic systolic heart failure ([place])   2. Arthralgia of both lower legs   3. OSA (obstructive sleep apnea)    1. Chronic systolic heart failure ([facility]) Denies any significant dyspnea on exertion/shortness of breath.  States she has occasional lower extremity edema but has not been totally compliant with her Lasix therapy.  Weight has been stable around 200-202.  We will increase Toprol-XL to 200 daily.  Heart rate today is 92 and blood pressure is 116/70.  Continue Entresto 97/103 p.o. twice daily.  Continue Lasix 60 mg daily.  May take additional 20 mg daily for shortness of breath.  Continue spironolactone 25 mg p.o. daily.  She states she needs to schedule a follow-up with Dr. Haroldine Bryant soon.      2. Arthralgia of both lower legs Patient states she has sciatic pain in her right leg and will see her doctor and schedule a possible MRI soon per her statement.  3. OSA (obstructive sleep apnea) Patient has not received her CPAP machine.  There appears to be some insurance issues in obtaining the device.  She is to see Dr. Elsworth Bryant soon to reexplore getting the machine.  Medication Adjustments/Labs and Tests Ordered: Current medicines are reviewed at length with the patient today.  Concerns regarding medicines are outlined above.   Disposition: Follow-up with  Dr. Harl Bryant or APP 6 months  Signed, Levell July, NP 02/10/2020 8:53 AM    [facility] [place], [place], [place] [address] Phone: [phone]; Fax: [phone]

## 2020-02-10 ENCOUNTER — Other Ambulatory Visit: Payer: Self-pay

## 2020-02-10 ENCOUNTER — Ambulatory Visit (INDEPENDENT_AMBULATORY_CARE_PROVIDER_SITE_OTHER): Payer: 59 | Admitting: Family Medicine

## 2020-02-10 ENCOUNTER — Encounter: Payer: Self-pay | Admitting: Family Medicine

## 2020-02-10 VITALS — BP 116/70 | HR 90 | Ht 67.0 in | Wt 202.0 lb

## 2020-02-10 DIAGNOSIS — I5022 Chronic systolic (congestive) heart failure: Secondary | ICD-10-CM

## 2020-02-10 DIAGNOSIS — M25561 Pain in right knee: Secondary | ICD-10-CM

## 2020-02-10 DIAGNOSIS — I10 Essential (primary) hypertension: Secondary | ICD-10-CM

## 2020-02-10 DIAGNOSIS — G4733 Obstructive sleep apnea (adult) (pediatric): Secondary | ICD-10-CM

## 2020-02-10 DIAGNOSIS — M25562 Pain in left knee: Secondary | ICD-10-CM

## 2020-02-10 MED ORDER — SPIRONOLACTONE 25 MG PO TABS
25.0000 mg | ORAL_TABLET | Freq: Every day | ORAL | 3 refills | Status: DC
Start: 2020-02-10 — End: 2020-05-03

## 2020-02-10 MED ORDER — METOPROLOL SUCCINATE ER 200 MG PO TB24
200.0000 mg | ORAL_TABLET | Freq: Every day | ORAL | 3 refills | Status: DC
Start: 2020-02-10 — End: 2021-08-27

## 2020-02-10 NOTE — Patient Instructions (Signed)
Medication Instructions:   Increase Toprol XL to 200mg  daily.   Spironolactone refilled today.   Continue all other medications.    Labwork: none  Testing/Procedures: none  Follow-Up: 6 months   Any Other Special Instructions Will Be Listed Below (If Applicable).  If you need a refill on your cardiac medications before your next appointment, please call your pharmacy.

## 2020-02-17 ENCOUNTER — Other Ambulatory Visit: Payer: Self-pay

## 2020-02-17 ENCOUNTER — Other Ambulatory Visit (INDEPENDENT_AMBULATORY_CARE_PROVIDER_SITE_OTHER): Payer: 59 | Admitting: *Deleted

## 2020-02-17 DIAGNOSIS — Z23 Encounter for immunization: Secondary | ICD-10-CM | POA: Diagnosis not present

## 2020-03-08 ENCOUNTER — Other Ambulatory Visit: Payer: Self-pay | Admitting: Nurse Practitioner

## 2020-03-08 ENCOUNTER — Other Ambulatory Visit: Payer: Self-pay | Admitting: "Endocrinology

## 2020-03-08 DIAGNOSIS — E1165 Type 2 diabetes mellitus with hyperglycemia: Secondary | ICD-10-CM

## 2020-04-05 ENCOUNTER — Other Ambulatory Visit (HOSPITAL_COMMUNITY): Payer: Self-pay | Admitting: Internal Medicine

## 2020-04-05 ENCOUNTER — Encounter: Payer: Self-pay | Admitting: Internal Medicine

## 2020-04-05 ENCOUNTER — Ambulatory Visit (INDEPENDENT_AMBULATORY_CARE_PROVIDER_SITE_OTHER): Payer: 59 | Admitting: Internal Medicine

## 2020-04-05 ENCOUNTER — Other Ambulatory Visit: Payer: Self-pay

## 2020-04-05 VITALS — BP 132/94 | HR 108 | Ht 67.0 in | Wt 200.0 lb

## 2020-04-05 DIAGNOSIS — I429 Cardiomyopathy, unspecified: Secondary | ICD-10-CM | POA: Diagnosis not present

## 2020-04-05 DIAGNOSIS — I420 Dilated cardiomyopathy: Secondary | ICD-10-CM

## 2020-04-05 LAB — CUP PACEART INCLINIC DEVICE CHECK
Brady Statistic RV Percent Paced: 1 % — CL
Date Time Interrogation Session: 20211229102715
HighPow Impedance: 77 Ohm
Implantable Lead Implant Date: 20200723
Implantable Lead Location: 753860
Implantable Lead Model: 293
Implantable Lead Serial Number: 443823
Implantable Pulse Generator Implant Date: 20200723
Lead Channel Impedance Value: 510 Ohm
Lead Channel Pacing Threshold Amplitude: 0.6 V
Lead Channel Pacing Threshold Pulse Width: 0.4 ms
Lead Channel Sensing Intrinsic Amplitude: 25 mV
Lead Channel Setting Pacing Amplitude: 3.5 V
Lead Channel Setting Pacing Pulse Width: 0.4 ms
Lead Channel Setting Sensing Sensitivity: 0.5 mV
Pulse Gen Serial Number: 256025

## 2020-04-05 NOTE — Patient Instructions (Signed)
Medication Instructions:  Your physician recommends that you continue on your current medications as directed. Please refer to the Current Medication list given to you today.  *If you need a refill on your cardiac medications before your next appointment, please call your pharmacy*   Lab Work: NONE   If you have labs (blood work) drawn today and your tests are completely normal, you will receive your results only by: . MyChart Message (if you have MyChart) OR . A paper copy in the mail If you have any lab test that is abnormal or we need to change your treatment, we will call you to review the results.   Testing/Procedures: NONE    Follow-Up: At [facility], you and your health needs are our priority.  As part of our continuing mission to provide you with exceptional heart care, we have created designated Provider Care Teams.  These Care Teams include your primary Cardiologist (physician) and Advanced Practice Providers (APPs -  Physician Assistants and Nurse Practitioners) who all work together to provide you with the care you need, when you need it.  We recommend signing up for the patient portal called "MyChart".  Sign up information is provided on this After Visit Summary.  MyChart is used to connect with patients for Virtual Visits (Telemedicine).  Patients are able to view lab/test results, encounter notes, upcoming appointments, etc.  Non-urgent messages can be sent to your provider as well.   To learn more about what you can do with MyChart, go to [email or web address].    Your next appointment:   1 year(s)  The format for your next appointment:   In Person  Provider:   Gregg Taylor, MD   Other Instructions Thank you for choosing [facility]!    

## 2020-04-05 NOTE — Progress Notes (Signed)
HPI [name] returns today for followup. She is a pleasant 49 yo woman with a non-ischemic CM, chronic systolic heart failure, HTN, and obesity, s/p ICD insertion. She has had covid and neck surgery for a pinched nerve in the interim. She denies chest pain or sob or ICD shocks. No edema.  Allergies  Allergen Reactions  . Aspartame And Phenylalanine Dermatitis     Current Outpatient Medications  Medication Sig Dispense Refill  . amoxicillin (AMOXIL) 500 MG capsule Take 1 capsule (500 mg total) by mouth 3 (three) times daily. 30 capsule 0  . apixaban (ELIQUIS) 5 MG TABS tablet Take 1 tablet (5 mg total) by mouth 2 (two) times daily. 180 tablet 3  . atorvastatin (LIPITOR) 10 MG tablet Take 1 tablet (10 mg total) by mouth daily. 90 tablet 1  . B-D ULTRAFINE III SHORT PEN 31G X 8 MM MISC USE FOR INSULIN INJECTIONS TWICE DAILY 100 each 3  . benzonatate (TESSALON) 100 MG capsule Take 1 capsule (100 mg total) by mouth 2 (two) times daily as needed for cough. 20 capsule 0  . Blood Glucose Monitoring Suppl (ACCU-CHEK GUIDE) w/Device KIT 1 Piece by Does not apply route as directed. 1 kit 0  . furosemide (LASIX) 40 MG tablet Take  1/2 tablets daily (60 mg)   May take additional 20 mg daily for Shortness of breath (Patient taking differently: Take 60 mg by mouth daily. May take additional 20 mg daily for Shortness of breath) 100 tablet 3  . glipiZIDE (GLUCOTROL) 5 MG tablet TAKE 1 TABLET(5 MG) BY MOUTH TWICE DAILY BEFORE A MEAL 180 tablet 0  . Insulin Aspart Prot & Aspart (INSULIN ASP PROT & ASP FLEXPEN) (70-30) 100 UNIT/ML SUPN INJECT 75 UNITS INTO THE SKIN AT BREAKFAST AND 90 UNITS AT SUPPER WHEN GLUCOSE IS ABOVE 90 45 mL 0  . Lancets (ONETOUCH DELICA PLUS [id number]) MISC USE TO TEST BLOOD SUGAR FOUR TIMES DAILY    . metFORMIN (GLUCOPHAGE) 500 MG tablet TAKE 1 TABLET(500 MG) BY MOUTH TWICE DAILY WITH A MEAL 180 tablet 0  . metoprolol succinate (TOPROL-XL) 200 MG 24 hr tablet Take 1 tablet  (200 mg total) by mouth daily. 90 tablet 3  . NORLYDA 0.35 MG tablet Take 1 tablet by mouth at bedtime.   12  . ONETOUCH ULTRA test strip USE TO CHECK BLOOD SUGAR EVERY MORNING, AT NOON, IN THE EVENING, EVERY NIGHT AT BEDTIME 200 strip 2  . sacubitril-valsartan (ENTRESTO) 97-103 MG Take 1 tablet by mouth 2 (two) times daily. 60 tablet 5  . spironolactone (ALDACTONE) 25 MG tablet Take 1 tablet (25 mg total) by mouth daily. 90 tablet 3   No current facility-administered medications for this visit.     Past Medical History:  Diagnosis Date  . AICD (automatic cardioverter/defibrillator) present   . Arrhythmia   . Arthritis   . CAD (coronary artery disease)    a. 03/2018: cath showing 60% mid-LAD stenosis not significant by FFR.   Marland Kitchen Chronic combined systolic (congestive) and diastolic (congestive) heart failure ([place])    a. 03/2018: found to have a newly reduced EF of 20% by echocardiogram  . Depression    denies  . Diabetes mellitus   . GERD (gastroesophageal reflux disease)    h/o of  . Hypertension   . Microproteinuria   . S/P thyroid biopsy 2020   follow up once a year,   . Sleep apnea     ROS:   All  systems reviewed and negative except as noted in the HPI.   Past Surgical History:  Procedure Laterality Date  . ANTERIOR CERVICAL DECOMP/DISCECTOMY FUSION N/A 12/03/2019   Procedure: ANTERIOR CERVICAL DECOMPRESSION/DISCECTOMY FUSION CERVICAL SIX- CERVICAL SEVEN;  Surgeon: Consuella Lose, MD;  Location: [facility];  Service: Neurosurgery;  Laterality: N/A;  ANTERIOR CERVICAL DECOMPRESSION/DISCECTOMY FUSION CERVICAL SIX- CERVICAL SEVEN  . BACK SURGERY  2010  . BREAST BIOPSY Left 2017   benign  . CARDIAC CATHETERIZATION    . ICD IMPLANT N/A 10/29/2018   Procedure: ICD IMPLANT;  Surgeon: Evans Lance, MD;  Location: [place] [facility];  Service: Cardiovascular;  Laterality: N/A;  . INTRAVASCULAR PRESSURE WIRE/FFR STUDY N/A 03/24/2018   Procedure: INTRAVASCULAR PRESSURE WIRE/FFR  STUDY;  Surgeon: Leonie Man, MD;  Location: [place] [facility];  Service: Cardiovascular;  Laterality: N/A;  . NOSE SURGERY    . RIGHT/LEFT HEART CATH AND CORONARY ANGIOGRAPHY N/A 03/24/2018   Procedure: RIGHT/LEFT HEART CATH AND CORONARY ANGIOGRAPHY;  Surgeon: Leonie Man, MD;  Location: [place] [facility];  Service: Cardiovascular;  Laterality: N/A;  . TONSILLECTOMY    . WISDOM TOOTH EXTRACTION       Family History  Problem Relation Age of Onset  . Stroke Sister 28       smoker, diabetic, HTN  . Breast cancer Sister   . Cancer Mother        breast  . Breast cancer Mother   . Hypertension Mother   . Diabetes Other   . Heart attack Other   . Chronic Renal Failure Father      Social History   Socioeconomic History  . Marital status: Married    Spouse name: Not on file  . Number of children: Not on file  . Years of education: Not on file  . Highest education level: Not on file  Occupational History  . Not on file  Tobacco Use  . Smoking status: Never Smoker  . Smokeless tobacco: Never Used  Vaping Use  . Vaping Use: Never used  Substance and Sexual Activity  . Alcohol use: No  . Drug use: No  . Sexual activity: Yes    Birth control/protection: Pill  Other Topics Concern  . Not on file  Social History Narrative  . Not on file   Social Determinants of Health   Financial Resource Strain: Not on file  Food Insecurity: Not on file  Transportation Needs: Not on file  Physical Activity: Not on file  Stress: Not on file  Social Connections: Not on file  Intimate Partner Violence: Not on file     BP (!) 132/94   Pulse (!) 108   Ht 5' 7" (1.702 m)   Wt 200 lb (90.7 kg)   SpO2 98%   BMI 31.32 kg/m   Physical Exam:  Well appearing NAD HEENT: Unremarkable Neck:  No JVD, no thyromegally Lymphatics:  No adenopathy Back:  No CVA tenderness Lungs:  Clear with no wheezes HEART:  Regular rate rhythm, no murmurs, no rubs, no clicks, S4 is  present Abd:  soft, positive bowel sounds, no organomegally, no rebound, no guarding Ext:  2 plus pulses, no edema, no cyanosis, no clubbing Skin:  No rashes no nodules Neuro:  CN II through XII intact, motor grossly intact  DEVICE  Normal device function.  See PaceArt for details.   Assess/Plan: 1. Chronic systolic heart failure - she has class 2 symptoms. I encouraged her to increase her physical activity.  2. ICD - her [place] [employer] single chamber ICD is working normally. We will recheck in several months. 3. HTN - her bp is up but she did not take her morning meds. I encouraged her to do so. 4. Obesity - I encouraged her to lose weight.   Carleene Overlie Clee Pandit,MD

## 2020-05-01 ENCOUNTER — Telehealth: Payer: Self-pay | Admitting: "Endocrinology

## 2020-05-01 DIAGNOSIS — E1165 Type 2 diabetes mellitus with hyperglycemia: Secondary | ICD-10-CM

## 2020-05-01 MED ORDER — INSULIN ASP PROT & ASP FLEXPEN (70-30) 100 UNIT/ML ~~LOC~~ SUPN: PEN_INJECTOR | SUBCUTANEOUS | 0 refills | Status: DC

## 2020-05-01 NOTE — Telephone Encounter (Signed)
Pt is calling and requesting a refill on Novolog.   [id number] - [place], [address] Phone:  [phone]  Fax:  [phone]-

## 2020-05-01 NOTE — Telephone Encounter (Signed)
Refill sent.

## 2020-05-02 ENCOUNTER — Other Ambulatory Visit: Payer: Self-pay | Admitting: Nurse Practitioner

## 2020-05-02 MED ORDER — NOVOLIN 70/30 FLEXPEN (70-30) 100 UNIT/ML ~~LOC~~ SUPN: PEN_INJECTOR | SUBCUTANEOUS | 1 refills | Status: DC

## 2020-05-02 NOTE — Addendum Note (Signed)
Addended by: Roosvelt Harps on: 05/02/2020 02:29 PM   Modules accepted: Orders

## 2020-05-02 NOTE — Telephone Encounter (Signed)
Pt made aware Rx for Novolin 70/30 sent in to replace Novolog per [name],NP. Understanding voiced per pt.

## 2020-05-02 NOTE — Telephone Encounter (Signed)
Left a message requesting a return call to the office. 

## 2020-05-02 NOTE — Telephone Encounter (Signed)
Pt called back and states she spoke to her pharmacy and they are needing a prior authorization.

## 2020-05-02 NOTE — Telephone Encounter (Signed)
Pt returning your call

## 2020-05-03 ENCOUNTER — Telehealth (HOSPITAL_COMMUNITY): Payer: Self-pay | Admitting: Pharmacy Technician

## 2020-05-03 ENCOUNTER — Ambulatory Visit (HOSPITAL_COMMUNITY)
Admission: RE | Admit: 2020-05-03 | Discharge: 2020-05-03 | Disposition: A | Discharge status: Home / Self Care | Payer: 59 | Source: Ambulatory Visit | Attending: Internal Medicine | Admitting: Internal Medicine

## 2020-05-03 ENCOUNTER — Encounter (HOSPITAL_COMMUNITY): Payer: Self-pay | Admitting: Internal Medicine

## 2020-05-03 ENCOUNTER — Other Ambulatory Visit: Payer: Self-pay

## 2020-05-03 VITALS — BP 140/90 | HR 102 | Wt 201.4 lb

## 2020-05-03 DIAGNOSIS — Z794 Long term (current) use of insulin: Secondary | ICD-10-CM | POA: Diagnosis not present

## 2020-05-03 DIAGNOSIS — Z833 Family history of diabetes mellitus: Secondary | ICD-10-CM | POA: Diagnosis not present

## 2020-05-03 DIAGNOSIS — G4733 Obstructive sleep apnea (adult) (pediatric): Secondary | ICD-10-CM | POA: Diagnosis not present

## 2020-05-03 DIAGNOSIS — I5022 Chronic systolic (congestive) heart failure: Secondary | ICD-10-CM

## 2020-05-03 DIAGNOSIS — Z95810 Presence of automatic (implantable) cardiac defibrillator: Secondary | ICD-10-CM | POA: Insufficient documentation

## 2020-05-03 DIAGNOSIS — Z7901 Long term (current) use of anticoagulants: Secondary | ICD-10-CM | POA: Insufficient documentation

## 2020-05-03 DIAGNOSIS — Z8249 Family history of ischemic heart disease and other diseases of the circulatory system: Secondary | ICD-10-CM | POA: Diagnosis not present

## 2020-05-03 DIAGNOSIS — I5042 Chronic combined systolic (congestive) and diastolic (congestive) heart failure: Secondary | ICD-10-CM | POA: Insufficient documentation

## 2020-05-03 DIAGNOSIS — I272 Pulmonary hypertension, unspecified: Secondary | ICD-10-CM | POA: Diagnosis not present

## 2020-05-03 DIAGNOSIS — I428 Other cardiomyopathies: Secondary | ICD-10-CM | POA: Insufficient documentation

## 2020-05-03 DIAGNOSIS — I2510 Atherosclerotic heart disease of native coronary artery without angina pectoris: Secondary | ICD-10-CM | POA: Diagnosis not present

## 2020-05-03 DIAGNOSIS — I110 Hypertensive heart disease with heart failure: Secondary | ICD-10-CM | POA: Diagnosis not present

## 2020-05-03 DIAGNOSIS — E119 Type 2 diabetes mellitus without complications: Secondary | ICD-10-CM | POA: Diagnosis not present

## 2020-05-03 DIAGNOSIS — Z79899 Other long term (current) drug therapy: Secondary | ICD-10-CM | POA: Diagnosis not present

## 2020-05-03 LAB — BASIC METABOLIC PANEL
Anion gap: 11 (ref 5–15)
BUN: 11 mg/dL (ref 6–20)
CO2: 21 mmol/L — ABNORMAL LOW (ref 22–32)
Calcium: 9.1 mg/dL (ref 8.9–10.3)
Chloride: 103 mmol/L (ref 98–111)
Creatinine, Ser: 0.74 mg/dL (ref 0.44–1.00)
GFR, Estimated: >60 mL/min (ref >60)
Glucose, Bld: 296 mg/dL — ABNORMAL HIGH (ref 70–99)
Potassium: 4.7 mmol/L (ref 3.5–5.1)
Sodium: 135 mmol/L (ref 135–145)

## 2020-05-03 LAB — CBC
HCT: 40.8 % (ref 36.0–46.0)
Hemoglobin: 14.1 g/dL (ref 12.0–15.0)
MCH: 31.1 pg (ref 26.0–34.0)
MCHC: 34.6 g/dL (ref 30.0–36.0)
MCV: 89.9 fL (ref 80.0–100.0)
Platelets: 186 10*3/uL (ref 150–400)
RBC: 4.54 MIL/uL (ref 3.87–5.11)
RDW: 12.9 % (ref 11.5–15.5)
WBC: 4.5 10*3/uL (ref 4.0–10.5)
nRBC: 0 % (ref 0.0–0.2)

## 2020-05-03 LAB — BRAIN NATRIURETIC PEPTIDE: B Natriuretic Peptide: 73.7 pg/mL (ref 0.0–100.0)

## 2020-05-03 NOTE — Telephone Encounter (Signed)
Patient Advocate Encounter   Received notification from [employer] that prior authorization for Corlan[place] is required.   PA submitted on CoverMyMeds Key [id number]   Status is pending   Will continue to follow.

## 2020-05-03 NOTE — Progress Notes (Signed)
ADVANCED HF CLINIC NOTE  Referring Physician: Dr. 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 Harl Bowie for HF Consultation.   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  Here for routine f/u. Had [place] in 10/21 (unvaccinated). Feels pretty good. No CP or SOB. No edema, orthopnea or PND. Blood sugars running high. Following with Dr. Dorris Fetch. Can do most activities without problem.   ICD interrogation: HL score 0. No VT/AF. Activity 2.2hr/day Mean HR 107 Personally reviewed     Past Medical History:  Diagnosis Date  . AICD (automatic cardioverter/defibrillator) present   . Arrhythmia   . Arthritis   . CAD (coronary artery disease)    a. 03/2018: cath showing 60% mid-LAD stenosis not significant by FFR.   Marland Kitchen Chronic combined systolic (congestive) and diastolic (congestive) heart failure ([place])    a. 03/2018: found to have a newly reduced EF of 20% by echocardiogram  . Depression    denies  . Diabetes mellitus   . GERD (gastroesophageal reflux disease)    h/o of  . Hypertension   . Microproteinuria   . S/P thyroid biopsy 2020   follow up once a year,   . Sleep apnea     Current Outpatient Medications  Medication Sig Dispense Refill  . apixaban (ELIQUIS) 5 MG TABS tablet Take 1 tablet (5 mg total)  by mouth 2 (two) times daily. 180 tablet 3  . atorvastatin (LIPITOR) 10 MG tablet Take 1 tablet (10 mg total) by mouth daily. 90 tablet 1  . B-D ULTRAFINE III SHORT PEN 31G X 8 MM MISC USE FOR INSULIN INJECTIONS TWICE DAILY 100 each 3  . benzonatate (TESSALON) 100 MG capsule Take 1 capsule (100 mg total) by mouth 2 (two) times daily as needed for cough. 20 capsule 0  . Blood Glucose Monitoring Suppl (ACCU-CHEK GUIDE) w/Device KIT 1 Piece by Does not apply route as directed. 1 kit 0  . ENTRESTO 97-103 MG TAKE 1 TABLET BY MOUTH TWICE DAILY 60 tablet 5  . furosemide (LASIX) 40 MG tablet Take  1/2 tablets daily (60 mg)   May take additional 20 mg daily for Shortness of breath (Patient taking differently: Take  1 1/2 tablets daily (60 mg)   May take additional 20 mg daily for Shortness of breath) 100 tablet 3  . glipiZIDE (GLUCOTROL) 5 MG tablet TAKE 1 TABLET(5 MG) BY MOUTH TWICE DAILY BEFORE A MEAL 180 tablet 0  . insulin isophane & regular human (NOVOLIN 70/30 FLEXPEN) (70-30) 100 UNIT/ML KwikPen INJECT 75 UNITS INTO SKIN AT BREAKFAST AND 90 UNITS AT SUPPER WHEN GLUCOSE ABOVE 90 60 mL 1  . Lancets (ONETOUCH DELICA PLUS [id number]) MISC USE TO TEST BLOOD SUGAR FOUR TIMES DAILY    . metFORMIN (GLUCOPHAGE) 500 MG tablet TAKE 1 TABLET(500 MG) BY MOUTH TWICE DAILY WITH A MEAL 180 tablet 0  .  metoprolol succinate (TOPROL-XL) 200 MG 24 hr tablet Take 1 tablet (200 mg total) by mouth daily. 90 tablet 3  . NORLYDA 0.35 MG tablet Take 1 tablet by mouth at bedtime.   12  . ONETOUCH ULTRA test strip USE TO CHECK BLOOD SUGAR EVERY MORNING, AT NOON, IN THE EVENING, EVERY NIGHT AT BEDTIME 200 strip 2  . spironolactone (ALDACTONE) 25 MG tablet Take 12.5 mg by mouth daily.     No current facility-administered medications for this encounter.    Allergies  Allergen Reactions  . Aspartame And Phenylalanine Dermatitis      Social History   Socioeconomic History  . Marital status: Married    Spouse name: Not on  file  . Number of children: Not on file  . Years of education: Not on file  . Highest education level: Not on file  Occupational History  . Not on file  Tobacco Use  . Smoking status: Never Smoker  . Smokeless tobacco: Never Used  Vaping Use  . Vaping Use: Never used  Substance and Sexual Activity  . Alcohol use: No  . Drug use: No  . Sexual activity: Yes    Birth control/protection: Pill  Other Topics Concern  . Not on file  Social History Narrative  . Not on file   Social Determinants of Health   Financial Resource Strain: Not on file  Food Insecurity: Not on file  Transportation Needs: Not on file  Physical Activity: Not on file  Stress: Not on file  Social Connections: Not on file  Intimate Partner Violence: Not on file      Family History  Problem Relation Age of Onset  . Stroke Sister 76       smoker, diabetic, HTN  . Breast cancer Sister   . Cancer Mother        breast  . Breast cancer Mother   . Hypertension Mother   . Diabetes Other   . Heart attack Other   . Chronic Renal Failure Father     Vitals:   05/03/20 1004  BP: 140/90  Pulse: (!) 102  SpO2: 98%  Weight: 91.4 kg (201 lb 6.4 oz)    PHYSICAL EXAM: General:  Well appearing. No resp difficulty HEENT: normal Neck: supple. no JVD. Carotids 2+ bilat; no bruits. No lymphadenopathy or thryomegaly appreciated. Cor: PMI nondisplaced. Regular. Mildly tachy. No rubs, gallops or murmurs. Lungs: clear Abdomen: soft, nontender, nondistended. No hepatosplenomegaly. No bruits or masses. Good bowel sounds. Extremities: no cyanosis, clubbing, rash, tr edema Neuro: alert & orientedx3, cranial nerves grossly intact. moves all 4 extremities w/o difficulty. Affect pleasant  ECG Sinus tach 107 Personally reviewed   ASSESSMENT & PLAN:  1.Chronicsystolic HF/NICM/noncompaction CM - 03/2018 echo LVEF 20% -03/2018 LHC/RHC: moderate nonobstrcutive CAD, mean PA 45, PCWP 31, CI 2.2 - 03/2018 cardiac MRI  consistent with biventricular noncompaction CM, moderate RV dysfunction - setting of low LVEF and noncompaction she was started on eliquis during prior admission at [facility] (due to thrombus risk, no thrombus noted on MRI) - 08/2018 echo: LVEF 25-30%, grade II diastolic dysfunction, mild RV dysfunction - 8/21 EF 25-30% RV ok. Personally reviewed - 10/29/18 ICD implantBoston Scientific  - Remains NYHA II-early III at the worse - Volume status looks good on exam and IC interrogation - Continue Entresto to 97/103 bid - Continue Toprol 200 daily.  - Continue spiro 12.5 daily (didn't tolerate 71m due to dizziness) - Have suggested FIranbut Dr. NDorris Fetchhas told her not  to take. It. Explained that for patients like her benefits far outweightthe risk.  - Will start ivabradine 50m bid for tchycardia - Wil get CPX test for risk stratification - Referred for Genetics testing but couldn't afford. [facility] Has a sister and a brother both who have kids. She will discuss with her brother.  - ICD interrogated as above.  - Labs today  2. Pulmonary hypertension - likely mixed picture  3. CAD - moderate nonobstructive disease by cath - no s/s angia  4. DM2 - discussed SGLT2i again. But she wants to discuss with Dr. NDorris Fetch 5. OSA - Sleep study 10/20 AHI 18 c/w moderate OSA - Followed by Dr. AReather Laurence MD  10:39 AM

## 2020-05-03 NOTE — Addendum Note (Signed)
Encounter addended by: Stanford Scotland, RN on: 05/03/2020 11:12 AM  Actions taken: Order list changed, Diagnosis association updated, Clinical Note Signed, Charge Capture section accepted

## 2020-05-03 NOTE — Progress Notes (Signed)
Medication Samples have been provided to the patient.  Drug name: Corlanor        Strength: 5 mg        Qty: 2  LOT: [id number]  Exp.Date: 7/25  Dosing instructions:  Take 1 tablet  2 x a day  The patient has been instructed regarding the correct time, dose, and frequency of taking this medication, including desired effects and most common side effects.   Juanita Laster Derral Colucci 11:13 AM 05/03/2020

## 2020-05-03 NOTE — Patient Instructions (Addendum)
Start Corlanor 5 mg (1Tablet)Twice daily  Labs done today, your results will be available in MyChart, we will contact you for abnormal readings.  Your physician recommends that you schedule a follow-up appointment in:  4 months  Your physician has recommended that you have a cardiopulmonary stress test (CPX). CPX testing is a non-invasive measurement of heart and lung function. It replaces a traditional treadmill stress test. This type of test provides a tremendous amount of information that relates not only to your present condition but also for future outcomes. This test combines measurements of you ventilation, respiratory gas exchange in the lungs, electrocardiogram (EKG), blood pressure and physical response before, during, and following an exercise protocol.  If you have any questions or concerns before your next appointment please send [place] a message through [place] or call our office at [phone].    TO LEAVE A MESSAGE FOR THE NURSE SELECT OPTION 2, PLEASE LEAVE A MESSAGE INCLUDING: . YOUR NAME . DATE OF BIRTH . CALL BACK NUMBER . REASON FOR CALL**this is important as we prioritize the call backs  [place] AS LONG AS YOU CALL BEFORE 4:00 PM   At the [facility], you and your health needs are our priority. As part of our continuing mission to provide you with exceptional heart care, we have created designated Provider Care Teams. These Care Teams include your primary Cardiologist (physician) and Advanced Practice Providers (APPs- Physician Assistants and Nurse Practitioners) who all work together to provide you with the care you need, when you need it.   You may see any of the following providers on your designated Care Team at your next follow up: Marland Kitchen Dr Glori Bickers . Dr Loralie Champagne . Darrick Grinder, NP . Lyda Jester, [place] . Audry Riles, PharmD   Please be sure to bring in all your medications bottles  to every appointment.

## 2020-05-03 NOTE — Addendum Note (Signed)
Encounter addended by: Stanford Scotland, RN on: 05/03/2020 11:19 AM  Actions taken: Clinical Note Signed

## 2020-05-03 NOTE — Addendum Note (Signed)
Encounter addended by: Stanford Scotland, RN on: 05/03/2020 11:15 AM  Actions taken: Clinical Note Signed

## 2020-05-04 MED ORDER — IVABRADINE HCL 5 MG PO TABS: 5.0000 mg | ORAL_TABLET | Freq: Two times a day (BID) | ORAL | 5 refills | Status: DC

## 2020-05-04 NOTE — Addendum Note (Signed)
Encounter addended by: Stanford Scotland, RN on: 05/04/2020 3:08 PM  Actions taken: Order list changed

## 2020-05-04 NOTE — Telephone Encounter (Signed)
Advanced Heart Failure Patient Advocate Encounter  Prior Authorization for Corlanor has been approved.    PA#  [id number] Effective dates: 05/03/20 through 05/02/21  Patients co-pay is $15 (30 days), $37.50 (90 days)  Asked nursing staff to send 90 day supply in for the patient. Called and left patient VM regarding approval.  Charlann Boxer, CPhT

## 2020-05-05 ENCOUNTER — Ambulatory Visit (INDEPENDENT_AMBULATORY_CARE_PROVIDER_SITE_OTHER): Payer: 59

## 2020-05-05 DIAGNOSIS — I420 Dilated cardiomyopathy: Secondary | ICD-10-CM | POA: Diagnosis not present

## 2020-05-05 LAB — CUP PACEART REMOTE DEVICE CHECK
Battery Remaining Longevity: 150 mo
Battery Remaining Percentage: 100 %
Brady Statistic RV Percent Paced: 0 %
Date Time Interrogation Session: 20220128044100
HighPow Impedance: 76 Ohm
Implantable Lead Implant Date: 20200723
Implantable Lead Location: 753860
Implantable Lead Model: 293
Implantable Lead Serial Number: 443823
Implantable Pulse Generator Implant Date: 20200723
Lead Channel Impedance Value: 480 Ohm
Lead Channel Setting Pacing Amplitude: 3.5 V
Lead Channel Setting Pacing Pulse Width: 0.4 ms
Lead Channel Setting Sensing Sensitivity: 0.5 mV
Pulse Gen Serial Number: 256025

## 2020-05-12 ENCOUNTER — Other Ambulatory Visit: Payer: Self-pay

## 2020-05-12 ENCOUNTER — Other Ambulatory Visit (HOSPITAL_COMMUNITY)
Admission: RE | Admit: 2020-05-12 | Discharge: 2020-05-12 | Disposition: A | Discharge status: Home / Self Care | Payer: 59 | Source: Ambulatory Visit | Attending: Nurse Practitioner | Admitting: Nurse Practitioner

## 2020-05-12 DIAGNOSIS — E1165 Type 2 diabetes mellitus with hyperglycemia: Secondary | ICD-10-CM | POA: Insufficient documentation

## 2020-05-12 LAB — COMPREHENSIVE METABOLIC PANEL
ALT: 26 U/L (ref 0–44)
AST: 18 U/L (ref 15–41)
Albumin: 3.8 g/dL (ref 3.5–5.0)
Alkaline Phosphatase: 63 U/L (ref 38–126)
Anion gap: 9 (ref 5–15)
BUN: 11 mg/dL (ref 6–20)
CO2: 24 mmol/L (ref 22–32)
Calcium: 8.7 mg/dL — ABNORMAL LOW (ref 8.9–10.3)
Chloride: 104 mmol/L (ref 98–111)
Creatinine, Ser: 0.77 mg/dL (ref 0.44–1.00)
GFR, Estimated: >60 mL/min (ref >60)
Glucose, Bld: 196 mg/dL — ABNORMAL HIGH (ref 70–99)
Potassium: 4.5 mmol/L (ref 3.5–5.1)
Sodium: 137 mmol/L (ref 135–145)
Total Bilirubin: 0.6 mg/dL (ref 0.3–1.2)
Total Protein: 7.4 g/dL (ref 6.5–8.1)

## 2020-05-12 LAB — HEMOGLOBIN A1C
Hgb A1c MFr Bld: 8.5 % — ABNORMAL HIGH (ref 4.8–5.6)
Mean Plasma Glucose: 197.25 mg/dL

## 2020-05-13 LAB — MICROALBUMIN / CREATININE URINE RATIO
Creatinine, Urine: 257.3 mg/dL
Microalb Creat Ratio: 8 mg/g creat (ref 0–29)
Microalb, Ur: 21.8 ug/mL — ABNORMAL HIGH

## 2020-05-13 NOTE — Progress Notes (Signed)
Remote ICD transmission.   

## 2020-05-17 ENCOUNTER — Encounter: Payer: Self-pay | Admitting: Nurse Practitioner

## 2020-05-17 ENCOUNTER — Other Ambulatory Visit: Payer: Self-pay

## 2020-05-17 ENCOUNTER — Ambulatory Visit (INDEPENDENT_AMBULATORY_CARE_PROVIDER_SITE_OTHER): Payer: 59 | Admitting: Nurse Practitioner

## 2020-05-17 VITALS — BP 125/84 | HR 87 | Ht 67.0 in | Wt 201.2 lb

## 2020-05-17 DIAGNOSIS — I10 Essential (primary) hypertension: Secondary | ICD-10-CM | POA: Diagnosis not present

## 2020-05-17 DIAGNOSIS — E782 Mixed hyperlipidemia: Secondary | ICD-10-CM

## 2020-05-17 DIAGNOSIS — E1165 Type 2 diabetes mellitus with hyperglycemia: Secondary | ICD-10-CM | POA: Diagnosis not present

## 2020-05-17 MED ORDER — FREESTYLE LITE DEVI: 1.0000 | Freq: Once | 0 refills | Status: AC

## 2020-05-17 MED ORDER — FREESTYLE LITE TEST VI STRP: ORAL_STRIP | 12 refills | Status: DC

## 2020-05-17 NOTE — Patient Instructions (Signed)

## 2020-05-17 NOTE — Progress Notes (Signed)
05/17/2020, 11:10 AM  Endocrinology follow-up note                              Subjective:    [name], female    DOB: Jan 18, 1971.  Tara Bryant is being seen in follow-up in  management of currently uncontrolled symptomatic diabetes requested by  Erven Colla, DO.   Past Medical History:  Diagnosis Date  . AICD (automatic cardioverter/defibrillator) present   . Arrhythmia   . Arthritis   . CAD (coronary artery disease)    a. 03/2018: cath showing 60% mid-LAD stenosis not significant by FFR.   Marland Kitchen Chronic combined systolic (congestive) and diastolic (congestive) heart failure ([place])    a. 03/2018: found to have a newly reduced EF of 20% by echocardiogram  . Depression    denies  . Diabetes mellitus   . GERD (gastroesophageal reflux disease)    h/o of  . Hypertension   . Microproteinuria   . S/P thyroid biopsy 2020   follow up once a year,   . Sleep apnea    Past Surgical History:  Procedure Laterality Date  . ANTERIOR CERVICAL DECOMP/DISCECTOMY FUSION N/A 12/03/2019   Procedure: ANTERIOR CERVICAL DECOMPRESSION/DISCECTOMY FUSION CERVICAL SIX- CERVICAL SEVEN;  Surgeon: Consuella Lose, MD;  Location: [facility];  Service: Neurosurgery;  Laterality: N/A;  ANTERIOR CERVICAL DECOMPRESSION/DISCECTOMY FUSION CERVICAL SIX- CERVICAL SEVEN  . BACK SURGERY  2010  . BREAST BIOPSY Left 2017   benign  . CARDIAC CATHETERIZATION    . ICD IMPLANT N/A 10/29/2018   Procedure: ICD IMPLANT;  Surgeon: Evans Lance, MD;  Location: [facility];  Service: Cardiovascular;  Laterality: N/A;  . INTRAVASCULAR PRESSURE WIRE/FFR STUDY N/A 03/24/2018   Procedure: INTRAVASCULAR PRESSURE WIRE/FFR STUDY;  Surgeon: Leonie Man, MD;  Location: [facility];  Service: Cardiovascular;  Laterality: N/A;  . NOSE SURGERY    . RIGHT/LEFT HEART CATH AND CORONARY ANGIOGRAPHY N/A 03/24/2018    Procedure: RIGHT/LEFT HEART CATH AND CORONARY ANGIOGRAPHY;  Surgeon: Leonie Man, MD;  Location: [facility];  Service: Cardiovascular;  Laterality: N/A;  . TONSILLECTOMY    . WISDOM TOOTH EXTRACTION     Social History   Socioeconomic History  . Marital status: Married    Spouse name: Not on file  . Number of children: Not on file  . Years of education: Not on file  . Highest education level: Not on file  Occupational History  . Not on file  Tobacco Use  . Smoking status: Never Smoker  . Smokeless tobacco: Never Used  Vaping Use  . Vaping Use: Never used  Substance and Sexual Activity  . Alcohol use: No  . Drug use: No  . Sexual activity: Yes    Birth control/protection: Pill  Other Topics Concern  . Not on file  Social History Narrative  . Not on file   Social Determinants of Health   Financial Resource Strain: Not on file  Food Insecurity: Not on file  Transportation Needs: Not on file  Physical Activity: Not on file  Stress: Not on file  Social Connections: Not on file   Outpatient Encounter Medications as of 05/17/2020  Medication Sig  . apixaban (ELIQUIS) 5 MG TABS tablet Take 1 tablet (5 mg total) by mouth 2 (two) times daily.  Marland Kitchen atorvastatin (LIPITOR) 10 MG tablet Take 1 tablet (10 mg total) by mouth daily.  . B-D ULTRAFINE III SHORT PEN 31G X 8 MM MISC USE FOR INSULIN INJECTIONS TWICE DAILY  . benzonatate (TESSALON) 100 MG capsule Take 1 capsule (100 mg total) by mouth 2 (two) times daily as needed for cough.  . Blood Glucose Monitoring Suppl (FREESTYLE LITE) DEVI 1 Device by Does not apply route once for 1 dose.  Marland Kitchen ENTRESTO 97-103 MG TAKE 1 TABLET BY MOUTH TWICE DAILY  . furosemide (LASIX) 40 MG tablet Take  1/2 tablets daily (60 mg)   May take additional 20 mg daily for Shortness of breath (Patient taking differently: Take  1 1/2 tablets daily (60 mg)   May take additional 20 mg daily for Shortness of breath)  . glipiZIDE (GLUCOTROL) 5 MG tablet TAKE  1 TABLET(5 MG) BY MOUTH TWICE DAILY BEFORE A MEAL  . glucose blood (FREESTYLE LITE) test strip Use as instructed  . insulin isophane & regular human (NOVOLIN 70/30 FLEXPEN) (70-30) 100 UNIT/ML KwikPen INJECT 75 UNITS INTO SKIN AT BREAKFAST AND 90 UNITS AT SUPPER WHEN GLUCOSE ABOVE 90  . ivabradine (CORLANOR) 5 MG TABS tablet Take 1 tablet (5 mg total) by mouth 2 (two) times daily with a meal.  . Lancets (ONETOUCH DELICA PLUS [id number]) MISC USE TO TEST BLOOD SUGAR FOUR TIMES DAILY  . metFORMIN (GLUCOPHAGE) 500 MG tablet TAKE 1 TABLET(500 MG) BY MOUTH TWICE DAILY WITH A MEAL  . metoprolol succinate (TOPROL-XL) 200 MG 24 hr tablet Take 1 tablet (200 mg total) by mouth daily.  . NORLYDA 0.35 MG tablet Take 1 tablet by mouth at bedtime.   Marland Kitchen spironolactone (ALDACTONE) 25 MG tablet Take 12.5 mg by mouth daily.  . [DISCONTINUED] Blood Glucose Monitoring Suppl (ACCU-CHEK GUIDE) w/Device KIT 1 Piece by Does not apply route as directed.  . [DISCONTINUED] ONETOUCH ULTRA test strip USE TO CHECK BLOOD SUGAR EVERY MORNING, AT NOON, IN THE EVENING, EVERY NIGHT AT BEDTIME   No facility-administered encounter medications on file as of 05/17/2020.    ALLERGIES: Allergies  Allergen Reactions  . Aspartame And Phenylalanine Dermatitis    VACCINATION STATUS: Immunization History  Administered Date(s) Administered  . Influenza Split 02/02/2013  . Influenza,inj,Quad PF,6+ Mos 01/11/2014, 12/15/2014, 12/15/2015, 01/03/2017, 01/28/2018, 02/03/2019, 02/17/2020  . Influenza-Unspecified 03/08/2011  . Pneumococcal Polysaccharide-23 03/30/2009  . Td 02/04/2006  . Tdap 02/26/2018    Diabetes She presents for her follow-up diabetic visit. She has type 2 diabetes mellitus. Onset time: She was diagnosed at approximate age of 62 years. Her disease course has been worsening. There are no hypoglycemic associated symptoms. Pertinent negatives for hypoglycemia include no confusion, headaches, pallor or seizures. Pertinent  negatives for diabetes include no chest pain, no fatigue, no polydipsia, no polyphagia, no polyuria and no weight loss. There are no hypoglycemic complications. Symptoms are stable. Diabetic complications include heart disease. Risk factors for coronary artery disease include diabetes mellitus, dyslipidemia, hypertension, family history and sedentary lifestyle. Current diabetic treatment includes insulin injections and oral agent (dual therapy). She is compliant with treatment most of the time. Her weight is stable. She is following a generally healthy diet. When asked about meal planning, she reported none. She has had a previous  visit with a dietitian. She never participates in exercise. There is no change in her home blood glucose trend. Her breakfast blood glucose range is generally >200 mg/dl. Her bedtime blood glucose range is generally 180-200 mg/dl. (She presents today with her meter and logs showing worsening glycemic profile overall.  Her previsit A1c was 8.5% on 05/12/20, increasing from last visit of 7.7%.  She reports she had COVID back in October and prior to that she was recovering from cervical spinal surgery.  She reports she just realized that she had not been taking the amount of insulin recommended at last visit.  She does report rare mild hypoglycemia.) An ACE inhibitor/angiotensin II receptor blocker is being taken. She does not see a podiatrist.Eye exam is current.  Hyperlipidemia This is a chronic problem. The current episode started more than 1 year ago. The problem is uncontrolled. Recent lipid tests were reviewed and are high. Exacerbating diseases include diabetes. Factors aggravating her hyperlipidemia include beta blockers and fatty foods. Pertinent negatives include no chest pain, myalgias or shortness of breath. Current antihyperlipidemic treatment includes statins. The current treatment provides no improvement of lipids. Compliance problems include adherence to diet and adherence to  exercise.  Risk factors for coronary artery disease include dyslipidemia, diabetes mellitus, hypertension, a sedentary lifestyle and family history.  Hypertension This is a chronic problem. The current episode started more than 1 year ago. The problem has been gradually improving since onset. The problem is controlled. Pertinent negatives include no chest pain, headaches, palpitations or shortness of breath. There are no associated agents to hypertension. Risk factors for coronary artery disease include diabetes mellitus, dyslipidemia, family history and sedentary lifestyle. Past treatments include beta blockers, diuretics and angiotensin blockers. The current treatment provides significant improvement. There are no compliance problems.  Hypertensive end-organ damage includes CAD/MI and heart failure.     Review of systems  Constitutional: + Minimally fluctuating body weight,  current Body mass index is 31.51 kg/m. , no fatigue, no subjective hyperthermia, no subjective hypothermia Eyes: no blurry vision, no xerophthalmia ENT: no sore throat, no nodules palpated in throat, no dysphagia/odynophagia, no hoarseness Cardiovascular: no chest pain, some intermittent SOB r/t CHF, no palpitations, no leg swelling Respiratory: no cough, some intermittent shortness of breath r/t CHF Gastrointestinal: no nausea/vomiting/diarrhea Musculoskeletal: no muscle/joint aches Skin: no rashes, no hyperemia,  Neurological: no tremors, no numbness, no tingling, no dizziness Psychiatric: no depression, + intermittent anxiety    Objective:    BP 125/84 (BP Location: Left Arm, Patient Position: Sitting)   Pulse 87   Ht _0  (1.702 m)   Wt 201 lb 3.2 oz (91.3 kg)   BMI 31.51 kg/m   Wt Readings from Last 3 Encounters:  05/17/20 201 lb 3.2 oz (91.3 kg)  05/03/20 201 lb 6.4 oz (91.4 kg)  04/05/20 200 lb (90.7 kg)    BP Readings from Last 3 Encounters:  05/17/20 125/84  05/03/20 140/90  04/05/20 (!) 132/94      Physical Exam- Limited  Constitutional:  Body mass index is 31.51 kg/m. , not in acute distress, mildly anxious state of mind Eyes:  EOMI, no exophthalmos Neck: Supple Cardiovascular: RRR, no murmers, rubs, or gallops, no edema Respiratory: Adequate breathing efforts, no crackles, rales, rhonchi, or wheezing Musculoskeletal: no gross deformities, strength intact in all four extremities, no gross restriction of joint movements Skin:  no rashes, no hyperemia Neurological: no tremor with outstretched hands    CMP ( most recent) CMP  Component Value Date/Time   NA 137 05/12/2020 1026   NA 138 04/03/2018 1240   K 4.5 05/12/2020 1026   CL 104 05/12/2020 1026   CO2 24 05/12/2020 1026   GLUCOSE 196 (H) 05/12/2020 1026   BUN 11 05/12/2020 1026   BUN 20 04/03/2018 1240   CREATININE 0.77 05/12/2020 1026   CREATININE 0.74 04/06/2014 0934   CALCIUM 8.7 (L) 05/12/2020 1026   PROT 7.4 05/12/2020 1026   PROT 6.5 03/26/2017 0916   ALBUMIN 3.8 05/12/2020 1026   ALBUMIN 4.0 03/26/2017 0916   AST 18 05/12/2020 1026   ALT 26 05/12/2020 1026   ALKPHOS 63 05/12/2020 1026   BILITOT 0.6 05/12/2020 1026   BILITOT 0.4 03/26/2017 0916   GFRNONAA >60 05/12/2020 1026   GFRAA >60 01/06/2020 1053     Diabetic Labs (most recent): Lab Results  Component Value Date   HGBA1C 8.5 (H) 05/12/2020   HGBA1C 7.7 (H) 11/30/2019   HGBA1C 7.7 (A) 10/04/2019     Lipid Panel ( most recent) Lipid Panel     Component Value Date/Time   CHOL 217 (H) 01/06/2020 1052   CHOL 202 (H) 03/26/2017 0916   TRIG 159 (H) 01/06/2020 1052   HDL 43 01/06/2020 1052   HDL 45 03/26/2017 0916   CHOLHDL 5.0 01/06/2020 1052   VLDL 32 01/06/2020 1052   LDLCALC 142 (H) 01/06/2020 1052   LDLCALC 134 (H) 03/26/2017 0916      Assessment & Plan:   1) Uncontrolled type 2 diabetes mellitus with hyperglycemia ([place])  - Tara Bryant has currently uncontrolled symptomatic type 2 DM since 50 years of age.  She  presents today with her meter and logs showing worsening glycemic profile overall.  Her previsit A1c was 8.5% on 05/12/20, increasing from last visit of 7.7%.  She reports she had COVID back in October and prior to that she was recovering from cervical spinal surgery.  She reports she just realized that she had not been taking the amount of insulin recommended at last visit.  She does report rare mild hypoglycemia.  her diabetes is complicated by coronary artery disease/CHF and she remains at a high risk for more acute and chronic complications which include CAD, CVA, CKD, retinopathy, and neuropathy. These are all discussed in detail with her.  - Nutritional counseling repeated at each appointment due to patients tendency to fall back in to old habits.  - The patient admits there is a room for improvement in their diet and drink choices. -  Suggestion is made for the patient to avoid simple carbohydrates from their diet including Cakes, Sweet Desserts / Pastries, Ice Cream, Soda (diet and regular), Sweet Tea, Candies, Chips, Cookies, Sweet Pastries,  Store Bought Juices, Alcohol in Excess of  1-2 drinks a day, Artificial Sweeteners, Coffee Creamer, and "Sugar-free" Products. This will help patient to have stable blood glucose profile and potentially avoid unintended weight gain.   - I encouraged the patient to switch to  unprocessed or minimally processed complex starch and increased protein intake (animal or plant source), fruits, and vegetables.   - Patient is advised to stick to a routine mealtimes to eat 3 meals  a day and avoid unnecessary snacks ( to snack only to correct hypoglycemia).  - I have approached her with the following individualized plan to manage diabetes and patient agrees:   -Based on her recent glycemic profile, she will tolerate slight increase in her insulin.  She is advised to increase her  Novolog 70/30 to 75 units with breakfast and 90 units with supper (as previously  recommended at previous visit).  She is advised to continue Metformin 500 mg twice daily with meals, and Glipizide 5 mg po twice daily with meals.  -I discussed the possibility of adding incretin therapy such as Trulicity which she would benefit from, however, she would like to hold off for now and re-discuss at next visit.  Dr. Jeffie Pollock, her cardiologist, recommended Wilder Glade or Jardiance due to cardiology benefits from CHF.  I discussed the risks associated with that class of medications and why we typically shy away from those medications.  -She is urged to continue monitoring blood glucose at least three times per day, before injecting insulin (at breakfast and supper) and before bed.  She is encouraged to call the clinic if she has readings less than 70 or greater than 300 for 3 tests in a row.  -She will be considered for some incretin therapy on subsequent visits. - Patient specific target  A1c; LDL, HDL, Triglycerides, were discussed in detail.  2) Blood Pressure /Hypertension: Her blood pressure is controlled to target.  She is advised to continue Lasix 20 mg po daily, Metoprolol 100 mg po daily, Spironolactone 25 mg daily, and Sacubitril-Valsartan 97-103 mg po twice daily.  She sees Dr. Haroldine Laws for CHF.  3) Lipids/Hyperlipidemia:  Her most recent lipid panel from 01/06/20 shows uncontrolled LDL of 142 and elevated triglycerides of 177.  She is advised to continue Lipitor 10 mg po daily at bedtime.  Side effects and precautions discussed with her.  4)  Weight/Diet:  Her Body mass index is 31.51 kg/m.-she is a candidate for weight loss.  I discussed with her the fact that loss of 5 - 10% of her  current body weight will have the most impact on her diabetes management.  CDE Consult will be initiated . Exercise, and detailed carbohydrates information provided  -  detailed on discharge instructions.  5) Chronic Care/Health Maintenance: -she is on ACEI/ARB medications and statin medications  and is encouraged to initiate and continue to follow up with Ophthalmology, Dentist,  Podiatrist at least yearly or according to recommendations, and advised to  stay away from smoking. I have recommended yearly flu vaccine and pneumonia vaccine at least every 5 years; moderate intensity exercise for up to 150 minutes weekly; and  sleep for at least 7 hours a day.  - she is  advised to maintain close follow up with Erven Colla, DO for primary care needs, as well as her other providers for optimal and coordinated care.   - Nutritional counseling repeated at each appointment due to patients tendency to fall back in to old habits.  - The patient admits there is a room for improvement in their diet and drink choices. -  Suggestion is made for the patient to avoid simple carbohydrates from their diet including Cakes, Sweet Desserts / Pastries, Ice Cream, Soda (diet and regular), Sweet Tea, Candies, Chips, Cookies, Sweet Pastries,  Store Bought Juices, Alcohol in Excess of  1-2 drinks a day, Artificial Sweeteners, Coffee Creamer, and "Sugar-free" Products. This will help patient to have stable blood glucose profile and potentially avoid unintended weight gain.   - I encouraged the patient to switch to  unprocessed or minimally processed complex starch and increased protein intake (animal or plant source), fruits, and vegetables.   - Patient is advised to stick to a routine mealtimes to eat 3 meals  a day and avoid  unnecessary snacks ( to snack only to correct hypoglycemia).  Follow up plan: - Return in about 3 months (around 08/14/2020) for Diabetes follow up with A1c in office, No previsit labs, Bring glucometer and logs.  Rayetta Pigg, [facility] [address] [place],  [address] Phone: [phone] Fax: [phone]  05/17/2020, 11:10 AM

## 2020-05-22 ENCOUNTER — Other Ambulatory Visit: Payer: Self-pay | Admitting: "Endocrinology

## 2020-05-29 ENCOUNTER — Ambulatory Visit: Payer: 59 | Admitting: Family Medicine

## 2020-05-30 ENCOUNTER — Ambulatory Visit (HOSPITAL_COMMUNITY): Payer: 59 | Attending: Cardiology

## 2020-05-30 ENCOUNTER — Other Ambulatory Visit: Payer: Self-pay

## 2020-05-30 ENCOUNTER — Other Ambulatory Visit (HOSPITAL_COMMUNITY): Payer: Self-pay | Admitting: *Deleted

## 2020-05-30 DIAGNOSIS — I5022 Chronic systolic (congestive) heart failure: Secondary | ICD-10-CM

## 2020-08-04 ENCOUNTER — Ambulatory Visit (INDEPENDENT_AMBULATORY_CARE_PROVIDER_SITE_OTHER): Payer: 59

## 2020-08-04 DIAGNOSIS — I420 Dilated cardiomyopathy: Secondary | ICD-10-CM

## 2020-08-07 LAB — CUP PACEART REMOTE DEVICE CHECK
Battery Remaining Longevity: 168 mo
Battery Remaining Percentage: 100 %
Brady Statistic RV Percent Paced: 0 %
Date Time Interrogation Session: 20220429044200
HighPow Impedance: 78 Ohm
Implantable Lead Implant Date: 20200723
Implantable Lead Location: 753860
Implantable Lead Model: 293
Implantable Lead Serial Number: 443823
Implantable Pulse Generator Implant Date: 20200723
Lead Channel Impedance Value: 510 Ohm
Lead Channel Setting Pacing Amplitude: 3.5 V
Lead Channel Setting Pacing Pulse Width: 0.4 ms
Lead Channel Setting Sensing Sensitivity: 0.5 mV
Pulse Gen Serial Number: 256025

## 2020-08-11 ENCOUNTER — Other Ambulatory Visit: Payer: Self-pay | Admitting: "Endocrinology

## 2020-08-14 ENCOUNTER — Encounter: Payer: Self-pay | Admitting: Cardiology

## 2020-08-14 ENCOUNTER — Ambulatory Visit (INDEPENDENT_AMBULATORY_CARE_PROVIDER_SITE_OTHER): Payer: 59 | Admitting: Cardiology

## 2020-08-14 VITALS — BP 130/90 | HR 96 | Ht 68.0 in | Wt 202.8 lb

## 2020-08-14 DIAGNOSIS — I5022 Chronic systolic (congestive) heart failure: Secondary | ICD-10-CM | POA: Diagnosis not present

## 2020-08-14 DIAGNOSIS — I2510 Atherosclerotic heart disease of native coronary artery without angina pectoris: Secondary | ICD-10-CM

## 2020-08-14 NOTE — Progress Notes (Signed)
Clinical Summary Ms. Crummie is a 50 y.o.female  seen today for follow up of the following medical problems.  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  - 08/2018 echo: LVEF 25-30%, grade II diastolic dysfunction, mild RV dysfunction - 10/29/18 ICD implant[employer] - followed by CHF clinic, has had close f/u with CHF pharmacy clinic as well. CPX 05/2020  - no recent SOB/DOE. Occasional LE edema at times. Sometimes misses doses of lasix due to schedule - has not started corlanor yet but has at home - from report her endocrinologist has not been in favor of SGLT2is for her     2. Pulmonary hypertension - type II, her mean PA was 45 and PCWP 41 in setting of severel volume overload. Moderate RV dysfunction by MRI  3. CAD - moderate nonobstructive disease by cath  - denies any chest pains   4. Sinus tachcyardia -resolved since last visit. Negative workup. She has had prior visits with sinus tach of unclear etiology. - has not started her corlanor yet.    5. 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 AICD (automatic cardioverter/defibrillator) present   . Arrhythmia   . Arthritis   . CAD (coronary artery disease)    a. 03/2018: cath showing 60% mid-LAD stenosis not significant by FFR.   Marland Kitchen Chronic combined systolic  (congestive) and diastolic (congestive) heart failure ([place])    a. 03/2018: found to have a newly reduced EF of 20% by echocardiogram  . Depression    denies  . Diabetes mellitus   . GERD (gastroesophageal reflux disease)    h/o of  . Hypertension   . Microproteinuria   . S/P thyroid biopsy 2020   follow up once a year,   . Sleep apnea      Allergies  Allergen Reactions  . Aspartame And Phenylalanine Dermatitis     Current Outpatient Medications  Medication Sig Dispense Refill  . apixaban (ELIQUIS) 5 MG TABS tablet Take 1 tablet (5 mg total) by mouth 2 (two) times daily. 180 tablet 3  . atorvastatin (LIPITOR) 10 MG tablet Take 1 tablet (10 mg total) by mouth daily. 90 tablet 1  . B-D ULTRAFINE III SHORT PEN 31G X 8 MM MISC USE FOR INSULIN INJECTIONS TWICE DAILY 100 each 3  . benzonatate (TESSALON) 100 MG capsule Take 1 capsule (100 mg total) by mouth 2 (two) times daily as needed for cough. 20 capsule 0  . ENTRESTO 97-103 MG TAKE 1 TABLET BY MOUTH TWICE DAILY 60 tablet 5  . furosemide (LASIX) 40 MG tablet Take  1/2 tablets daily (60 mg)   May take additional 20 mg daily for Shortness of breath (Patient taking differently: Take  1 1/2 tablets daily (60 mg)   May take additional 20 mg daily for Shortness of breath) 100 tablet 3  . glipiZIDE (  GLUCOTROL) 5 MG tablet TAKE 1 TABLET(5 MG) BY MOUTH TWICE DAILY BEFORE A MEAL 180 tablet 0  . glucose blood (FREESTYLE LITE) test strip Use as instructed 100 each 12  . insulin isophane & regular human (NOVOLIN 70/30 FLEXPEN) (70-30) 100 UNIT/ML KwikPen INJECT 75 UNITS INTO SKIN AT BREAKFAST AND 90 UNITS AT SUPPER WHEN GLUCOSE ABOVE 90 60 mL 1  . ivabradine (CORLANOR) 5 MG TABS tablet Take 1 tablet (5 mg total) by mouth 2 (two) times daily with a meal. 180 tablet 5  . Lancets (ONETOUCH DELICA PLUS [id number]) MISC USE TO TEST BLOOD SUGAR FOUR TIMES DAILY    . metFORMIN (GLUCOPHAGE) 500 MG tablet TAKE 1 TABLET(500 MG) BY MOUTH TWICE DAILY WITH A MEAL  180 tablet 0  . metoprolol succinate (TOPROL-XL) 200 MG 24 hr tablet Take 1 tablet (200 mg total) by mouth daily. 90 tablet 3  . NORLYDA 0.35 MG tablet Take 1 tablet by mouth at bedtime.   12  . spironolactone (ALDACTONE) 25 MG tablet Take 12.5 mg by mouth daily.     No current facility-administered medications for this visit.     Past Surgical History:  Procedure Laterality Date  . ANTERIOR CERVICAL DECOMP/DISCECTOMY FUSION N/A 12/03/2019   Procedure: ANTERIOR CERVICAL DECOMPRESSION/DISCECTOMY FUSION CERVICAL SIX- CERVICAL SEVEN;  Surgeon: Consuella Lose, MD;  Location: [place];  Service: Neurosurgery;  Laterality: N/A;  ANTERIOR CERVICAL DECOMPRESSION/DISCECTOMY FUSION CERVICAL SIX- CERVICAL SEVEN  . BACK SURGERY  2010  . BREAST BIOPSY Left 2017   benign  . CARDIAC CATHETERIZATION    . ICD IMPLANT N/A 10/29/2018   Procedure: ICD IMPLANT;  Surgeon: Evans Lance, MD;  Location: [place] [facility];  Service: Cardiovascular;  Laterality: N/A;  . INTRAVASCULAR PRESSURE WIRE/FFR STUDY N/A 03/24/2018   Procedure: INTRAVASCULAR PRESSURE WIRE/FFR STUDY;  Surgeon: Leonie Man, MD;  Location:  [facility];  Service: Cardiovascular;  Laterality: N/A;  . NOSE SURGERY    . RIGHT/LEFT HEART CATH AND CORONARY ANGIOGRAPHY N/A 03/24/2018   Procedure: RIGHT/LEFT HEART CATH AND CORONARY ANGIOGRAPHY;  Surgeon: Leonie Man, MD;  Location: [place] [facility];  Service: Cardiovascular;  Laterality: N/A;  . TONSILLECTOMY    . WISDOM TOOTH EXTRACTION       Allergies  Allergen Reactions  . Aspartame And Phenylalanine Dermatitis      Family History  Problem Relation Age of Onset  . Stroke Sister 34       smoker, diabetic, HTN  . Breast cancer Sister   . Cancer Mother        breast  . Breast cancer Mother   . Hypertension Mother   . Diabetes Other   . Heart attack Other   . Chronic Renal Failure Father      Social History Ms. Misener reports that she has never smoked.  She has never used smokeless tobacco. Ms. Natividad reports no history of alcohol use.   Review of Systems CONSTITUTIONAL: No weight loss, fever, chills, weakness or fatigue.  HEENT: Eyes: No visual loss, blurred vision, double vision or yellow sclerae.No hearing loss, sneezing, congestion, runny nose or sore throat.  SKIN: No rash or itching.  CARDIOVASCULAR: per hpi RESPIRATORY: No shortness of breath, cough or sputum.  GASTROINTESTINAL: No anorexia, nausea, vomiting or diarrhea. No abdominal pain or blood.  GENITOURINARY: No burning on urination, no polyuria NEUROLOGICAL: No headache, dizziness, syncope, paralysis, ataxia, numbness or tingling in the extremities. No change in bowel or bladder control.  MUSCULOSKELETAL: No  muscle, back pain, joint pain or stiffness.  LYMPHATICS: No enlarged nodes. No history of splenectomy.  PSYCHIATRIC: No history of depression or anxiety.  ENDOCRINOLOGIC: No reports of sweating, cold or heat intolerance. No polyuria or polydipsia.  Marland Kitchen   Physical Examination Today's Vitals   08/14/20 0831  BP: 130/90  Pulse: 96  SpO2: 98%  Weight: 202 lb 12.8 oz (92 kg)  Height: 5\' 8"  (1.727 m)   Body mass index is 30.84 kg/m.  Gen: resting comfortably, no acute distress HEENT: no scleral icterus, pupils equal round and reactive, no palptable cervical adenopathy,  CV: RRR, no m/r/g no jvd Resp: Clear to auscultation bilaterally GI: abdomen is soft, non-tender, non-distended, normal bowel sounds, no hepatosplenomegaly MSK: extremities are warm, no edema.  Skin: warm, no rash Neuro:  no focal deficits Psych: appropriate affect   Diagnostic Studies 03/2018 echo LVEF 20%, diffuse hypokinesis, cannot eval diastolic function, mild RV dysfunction, PASP 47, small pericardial effusion  03/2018 LHC/RHC  Prox LAD lesion is 55% stenosed. DFR 0.92  Otherwise angiographically normal coronary arteries.  Hemodynamic findings consistent with severe pulmonary  hypertension.  LV end diastolic pressure is severely elevated.  SUMMARY:  Moderate single-vessel disease with roughly 60% mid LAD (DFR 0.92 -not physiologically significant)  Otherwise minimal CAD.  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 (Output 4.48, Index 2.2.)   03/2018 cardiac MRI FINDINGS: 1. Moderately dilated left ventricle with mild concentric hypertrophy and severe systolic function (LVEF = 27%). Severe diffuse hypokinesis and paradoxical septal motion. There is no late gadolinium enhancement in the left ventricular myocardium. There is significant hyper trabeculation of the left ventricle with ratio of non-compacted to compacted myocardium > 4:1.  LVEDD: 61 mm  LVESD: 51 mm  LVEDV: 170 ml  LVESV: 124 ml  SV: 46 ml  CO: 4.6 L/min  Myocardial mass: 166 g  2. Normal right ventricular size, thickness and moderate systolic dysfunction (LVEF = 30%). There are no regional wall motion abnormalities.  3. Normal left and right atrial size.  4. Normal size of the aortic root, ascending aorta and pulmonary artery.  5. Trivial mitral and mild tricuspid regurgitation.  6. Normal pericardium. Mild circumferential pericardial effusion, moderate amount inferior to the heart (up to 11 mm).  IMPRESSION: 1. Moderately dilated left ventricle with mild concentric hypertrophy and severe systolic function (LVEF = 27%). Severe diffuse hypokinesis and paradoxical septal motion. There is no late gadolinium enhancement in the left ventricular myocardium. There is significant hyper trabeculation of the left ventricle with ratio of non-compacted to compacted myocardium > 4:1.  2. Normal right ventricular size, thickness and moderate systolic dysfunction (LVEF = 30%). There are no  regional wall motion abnormalities.  3. Normal left and right atrial size.  4. Normal size of the aortic root, ascending aorta and pulmonary artery.  5. Trivial mitral and mild tricuspid regurgitation.  6. Normal pericardium. Mild circumferential pericardial effusion, moderate amount inferior to the heart (up to 11 mm).  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   08/2018 echo IMPRESSIONS   1. The left ventricle has severely reduced systolic function, with an ejection fraction of 25-30%. The cavity size was normal. There is mild concentric left ventricular hypertrophy. Left ventricular diastolic Doppler parameters are consistent with  pseudonormalization. Elevated left ventricular end-diastolic pressure Left ventricular diffuse hypokinesis. 2. The left ventricular  apex appears trabeculated. 3. The right ventricle has mildly reduced systolic function. The cavity was normal. There is mildly increased right ventricular wall thickness. 4. The mitral valve is grossly normal. Mild thickening of the mitral valve leaflet. Mitral valve regurgitation is mild to moderate by color flow Doppler. 5. The tricuspid valve is grossly normal. 6. The aortic valve is grossly normal. Mild thickening of the aortic valve. 7. The aortic root is normal in size and structure.   01/2019 IMPRESSIONS 1.Moderate obstructive sleep apnea syndrome worse during REM sleep is noted with this recording. AutoPap 5-12 as recommended.    Assessment and Plan  1.Chronic systolic HF/Noncompaction cardiomyopathy - NICM, noncompaction CM by MRI - has been on anticoag in setting of noncompaction and low LVEF, continue at this time.She has an ICD - no recent symptoms - encouraged her to start her corlanor, continue to discusse SGLT2i's with her endocrinologist.   2. CAD - no symptoms, continue to monitor     Arnoldo Lenis, M.D.

## 2020-08-14 NOTE — Patient Instructions (Signed)
Your physician recommends that you schedule a follow-up appointment in: 6 MONTHS WITH DR BRANCH  Your physician recommends that you continue on your current medications as directed. Please refer to the Current Medication list given to you today.  Thank you for choosing [facility]!!    

## 2020-08-16 ENCOUNTER — Ambulatory Visit (INDEPENDENT_AMBULATORY_CARE_PROVIDER_SITE_OTHER): Payer: 59 | Admitting: Nurse Practitioner

## 2020-08-16 ENCOUNTER — Other Ambulatory Visit: Payer: Self-pay

## 2020-08-16 ENCOUNTER — Other Ambulatory Visit: Payer: Self-pay | Admitting: Nurse Practitioner

## 2020-08-16 ENCOUNTER — Encounter: Payer: Self-pay | Admitting: Nurse Practitioner

## 2020-08-16 VITALS — BP 125/84 | HR 114 | Ht 67.0 in | Wt 199.0 lb

## 2020-08-16 DIAGNOSIS — E559 Vitamin D deficiency, unspecified: Secondary | ICD-10-CM

## 2020-08-16 DIAGNOSIS — I10 Essential (primary) hypertension: Secondary | ICD-10-CM | POA: Diagnosis not present

## 2020-08-16 DIAGNOSIS — E782 Mixed hyperlipidemia: Secondary | ICD-10-CM

## 2020-08-16 DIAGNOSIS — E1165 Type 2 diabetes mellitus with hyperglycemia: Secondary | ICD-10-CM | POA: Diagnosis not present

## 2020-08-16 LAB — POCT GLYCOSYLATED HEMOGLOBIN (HGB A1C): Hemoglobin A1C: 9.3 % — AB (ref 4.0–5.6)

## 2020-08-16 MED ORDER — METFORMIN HCL 500 MG PO TABS: 1000.0000 mg | ORAL_TABLET | Freq: Two times a day (BID) | ORAL | 3 refills | Status: DC

## 2020-08-16 NOTE — Progress Notes (Signed)
08/16/2020, 12:19 PM  Endocrinology follow-up note                              Subjective:    [name], female    DOB: [birth year].  Tara Bryant is being seen in follow-up in  management of currently uncontrolled symptomatic diabetes requested by  Erven Colla, DO.   Past Medical History:  Diagnosis Date  . AICD (automatic cardioverter/defibrillator) present   . Arrhythmia   . Arthritis   . CAD (coronary artery disease)    a. 03/2018: cath showing 60% mid-LAD stenosis not significant by FFR.   Marland Kitchen Chronic combined systolic (congestive) and diastolic (congestive) heart failure ([place])    a. 03/2018: found to have a newly reduced EF of 20% by echocardiogram  . Depression    denies  . Diabetes mellitus   . GERD (gastroesophageal reflux disease)    h/o of  . Hypertension   . Microproteinuria   . S/P thyroid biopsy 2020   follow up once a year,   . Sleep apnea    Past Surgical History:  Procedure Laterality Date  . ANTERIOR CERVICAL DECOMP/DISCECTOMY FUSION N/A 12/03/2019   Procedure: ANTERIOR CERVICAL DECOMPRESSION/DISCECTOMY FUSION CERVICAL SIX- CERVICAL SEVEN;  Surgeon: Consuella Lose, MD;  Location: [facility];  Service: Neurosurgery;  Laterality: N/A;  ANTERIOR CERVICAL DECOMPRESSION/DISCECTOMY FUSION CERVICAL SIX- CERVICAL SEVEN  . BACK SURGERY  2010  . BREAST BIOPSY Left 2017   benign  . CARDIAC CATHETERIZATION    . ICD IMPLANT N/A 10/29/2018   Procedure: ICD IMPLANT;  Surgeon: Evans Lance, MD;  Location: [facility];  Service: Cardiovascular;  Laterality: N/A;  . INTRAVASCULAR PRESSURE WIRE/FFR STUDY N/A 03/24/2018   Procedure: INTRAVASCULAR PRESSURE WIRE/FFR STUDY;  Surgeon: Leonie Man, MD;  Location: [facility];  Service: Cardiovascular;  Laterality: N/A;  . NOSE SURGERY    . RIGHT/LEFT HEART CATH AND CORONARY ANGIOGRAPHY N/A 03/24/2018    Procedure: RIGHT/LEFT HEART CATH AND CORONARY ANGIOGRAPHY;  Surgeon: Leonie Man, MD;  Location: [facility];  Service: Cardiovascular;  Laterality: N/A;  . TONSILLECTOMY    . WISDOM TOOTH EXTRACTION     Social History   Socioeconomic History  . Marital status: Married    Spouse name: Not on file  . Number of children: Not on file  . Years of education: Not on file  . Highest education level: Not on file  Occupational History  . Not on file  Tobacco Use  . Smoking status: Never Smoker  . Smokeless tobacco: Never Used  Vaping Use  . Vaping Use: Never used  Substance and Sexual Activity  . Alcohol use: No  . Drug use: No  . Sexual activity: Yes    Birth control/protection: Pill  Other Topics Concern  . Not on file  Social History Narrative  . Not on file   Social Determinants of Health   Financial Resource Strain: Not on file  Food Insecurity: Not on file  Transportation Needs: Not on file  Physical Activity: Not on file  Stress: Not on file  Social Connections: Not on file   Outpatient Encounter Medications as of 08/16/2020  Medication Sig  . apixaban (ELIQUIS) 5 MG TABS tablet Take 1 tablet (5 mg total) by mouth 2 (two) times daily.  Marland Kitchen atorvastatin (LIPITOR) 10 MG tablet Take 1 tablet (10 mg total) by mouth daily.  . B-D ULTRAFINE III SHORT PEN 31G X 8 MM MISC USE FOR INSULIN INJECTIONS TWICE DAILY  . benzonatate (TESSALON) 100 MG capsule Take 1 capsule (100 mg total) by mouth 2 (two) times daily as needed for cough.  Marland Kitchen ENTRESTO 97-103 MG TAKE 1 TABLET BY MOUTH TWICE DAILY  . furosemide (LASIX) 40 MG tablet Take  1/2 tablets daily (60 mg)   May take additional 20 mg daily for Shortness of breath (Patient taking differently: Take  1 1/2 tablets daily (60 mg)   May take additional 20 mg daily for Shortness of breath)  . glipiZIDE (GLUCOTROL) 5 MG tablet TAKE 1 TABLET(5 MG) BY MOUTH TWICE DAILY BEFORE A MEAL  . glucose blood (FREESTYLE LITE) test strip Use as  instructed  . insulin isophane & regular human (NOVOLIN 70/30 FLEXPEN) (70-30) 100 UNIT/ML KwikPen INJECT 75 UNITS INTO THE SKIN AT BREAKFAST AND 90 UNITS AT DINNER WHEN GLUCOSE IS ABOVE 90  . ivabradine (CORLANOR) 5 MG TABS tablet Take 1 tablet (5 mg total) by mouth 2 (two) times daily with a meal. (Patient not taking: No sig reported)  . Lancets (ONETOUCH DELICA PLUS GLOVFI43P) MISC USE TO TEST BLOOD SUGAR FOUR TIMES DAILY  . metFORMIN (GLUCOPHAGE) 500 MG tablet Take 2 tablets (1,000 mg total) by mouth 2 (two) times daily with a meal.  . metoprolol succinate (TOPROL-XL) 200 MG 24 hr tablet Take 1 tablet (200 mg total) by mouth daily.  . NORLYDA 0.35 MG tablet Take 1 tablet by mouth at bedtime.   Marland Kitchen spironolactone (ALDACTONE) 25 MG tablet Take 12.5 mg by mouth daily.  . [DISCONTINUED] insulin isophane & regular human (NOVOLIN 70/30 FLEXPEN) (70-30) 100 UNIT/ML KwikPen INJECT 75 UNITS INTO SKIN AT BREAKFAST AND 90 UNITS AT SUPPER WHEN GLUCOSE ABOVE 90 (Patient not taking: Reported on 08/14/2020)  . [DISCONTINUED] insulin isophane & regular human (NOVOLIN 70/30 FLEXPEN) (70-30) 100 UNIT/ML KwikPen INJECT 75 UNITS INTO THE SKIN AT BREAKFAST AND 90 UNITS AT DINNER WHEN GLUCOSE IS ABOVE 90  . [DISCONTINUED] metFORMIN (GLUCOPHAGE) 500 MG tablet TAKE 1 TABLET(500 MG) BY MOUTH TWICE DAILY WITH A MEAL   No facility-administered encounter medications on file as of 08/16/2020.    ALLERGIES: Allergies  Allergen Reactions  . Aspartame And Phenylalanine Dermatitis    VACCINATION STATUS: Immunization History  Administered Date(s) Administered  . Influenza Split 02/02/2013  . Influenza,inj,Quad PF,6+ Mos 01/11/2014, 12/15/2014, 12/15/2015, 01/03/2017, 01/28/2018, 02/03/2019, 02/17/2020  . Influenza-Unspecified 03/08/2011  . Pneumococcal Polysaccharide-23 03/30/2009  . Td 02/04/2006  . Tdap 02/26/2018    Diabetes She presents for her follow-up diabetic visit. She has type 2 diabetes mellitus. Onset  time: She was diagnosed at approximate age of 50 years. Her disease course has been worsening. Hypoglycemia symptoms include tremors. Pertinent negatives for hypoglycemia include no confusion, headaches, pallor or seizures. Pertinent negatives for diabetes include no chest pain, no fatigue, no polydipsia, no polyphagia, no polyuria and no weight loss. There are no hypoglycemic complications. Symptoms are stable. Diabetic complications include heart disease. Risk factors for coronary artery disease include diabetes mellitus, dyslipidemia, hypertension, family history and sedentary lifestyle. Current diabetic treatment includes insulin injections and oral  agent (dual therapy). She is compliant with treatment some of the time (says she forgets to take her insulin from time to time). Her weight is fluctuating minimally. She is following a generally healthy diet. When asked about meal planning, she reported none. She has had a previous visit with a dietitian. She never participates in exercise. Her home blood glucose trend is increasing steadily. Her breakfast blood glucose range is generally >200 mg/dl. Her bedtime blood glucose range is generally >200 mg/dl. (She presents today with her meter and logs showing above target fasting and postprandial glycemic profile.  Her POCT A1c today is 9.3%, worsening from previous visit of 8.5%.  She reports symptoms of low glucose around lunch time but does not check it when she has those symptoms.  She admitted she forgets to take her insulin from time to time during her busy schedule.  There are no significant episodes of hypoglycemia documented.  She still reports increased stress levels which have interrupted her eating and physical activity.) An ACE inhibitor/angiotensin II receptor blocker is being taken. She does not see a podiatrist.Eye exam is current.  Hyperlipidemia This is a chronic problem. The current episode started more than 1 year ago. The problem is uncontrolled.  Recent lipid tests were reviewed and are high. Exacerbating diseases include diabetes. Factors aggravating her hyperlipidemia include beta blockers and fatty foods. Pertinent negatives include no chest pain, myalgias or shortness of breath. Current antihyperlipidemic treatment includes statins. The current treatment provides no improvement of lipids. Compliance problems include adherence to diet and adherence to exercise.  Risk factors for coronary artery disease include dyslipidemia, diabetes mellitus, hypertension, a sedentary lifestyle and family history.  Hypertension This is a chronic problem. The current episode started more than 1 year ago. The problem has been gradually improving since onset. The problem is controlled. Pertinent negatives include no chest pain, headaches, palpitations or shortness of breath. There are no associated agents to hypertension. Risk factors for coronary artery disease include diabetes mellitus, dyslipidemia, family history and sedentary lifestyle. Past treatments include beta blockers, diuretics and angiotensin blockers. The current treatment provides significant improvement. There are no compliance problems.  Hypertensive end-organ damage includes CAD/MI and heart failure.     Review of systems  Constitutional: + Minimally fluctuating body weight,  current Body mass index is 31.17 kg/m. , no fatigue, no subjective hyperthermia, no subjective hypothermia Eyes: no blurry vision, no xerophthalmia ENT: no sore throat, no nodules palpated in throat, no dysphagia/odynophagia, no hoarseness Cardiovascular: no chest pain, some intermittent SOB r/t CHF, no palpitations, no leg swelling Respiratory: no cough, some intermittent shortness of breath r/t CHF Gastrointestinal: no nausea/vomiting/diarrhea Musculoskeletal: no muscle/joint aches Skin: no rashes, no hyperemia,  Neurological: no tremors, no numbness, no tingling, no dizziness Psychiatric: no depression, +  intermittent anxiety    Objective:    BP 125/84   Pulse (!) 114   Ht 5\' 7"  (1.702 m)   Wt 199 lb (90.3 kg)   BMI 31.17 kg/m   Wt Readings from Last 3 Encounters:  08/16/20 199 lb (90.3 kg)  08/14/20 202 lb 12.8 oz (92 kg)  05/17/20 201 lb 3.2 oz (91.3 kg)    BP Readings from Last 3 Encounters:  08/16/20 125/84  08/14/20 130/90  05/17/20 125/84      Physical Exam- Limited  Constitutional:  Body mass index is 31.17 kg/m. , not in acute distress, anxious state of mind Eyes:  EOMI, no exophthalmos Neck: Supple Cardiovascular: RRR, no murmers, rubs, or  gallops, no edema Respiratory: Adequate breathing efforts, no crackles, rales, rhonchi, or wheezing Musculoskeletal: no gross deformities, strength intact in all four extremities, no gross restriction of joint movements Skin:  no rashes, no hyperemia Neurological: no tremor with outstretched hands    CMP ( most recent) CMP     Component Value Date/Time   NA 137 05/12/2020 1026   NA 138 04/03/2018 1240   K 4.5 05/12/2020 1026   CL 104 05/12/2020 1026   CO2 24 05/12/2020 1026   GLUCOSE 196 (H) 05/12/2020 1026   BUN 11 05/12/2020 1026   BUN 20 04/03/2018 1240   CREATININE 0.77 05/12/2020 1026   CREATININE 0.74 04/06/2014 0934   CALCIUM 8.7 (L) 05/12/2020 1026   PROT 7.4 05/12/2020 1026   PROT 6.5 03/26/2017 0916   ALBUMIN 3.8 05/12/2020 1026   ALBUMIN 4.0 03/26/2017 0916   AST 18 05/12/2020 1026   ALT 26 05/12/2020 1026   ALKPHOS 63 05/12/2020 1026   BILITOT 0.6 05/12/2020 1026   BILITOT 0.4 03/26/2017 0916   GFRNONAA >60 05/12/2020 1026   GFRAA >60 01/06/2020 1053     Diabetic Labs (most recent): Lab Results  Component Value Date   HGBA1C 9.3 (A) 08/16/2020   HGBA1C 8.5 (H) 05/12/2020   HGBA1C 7.7 (H) 11/30/2019     Lipid Panel ( most recent) Lipid Panel     Component Value Date/Time   CHOL 217 (H) 01/06/2020 1052   CHOL 202 (H) 03/26/2017 0916   TRIG 159 (H) 01/06/2020 1052   HDL 43  01/06/2020 1052   HDL 45 03/26/2017 0916   CHOLHDL 5.0 01/06/2020 1052   VLDL 32 01/06/2020 1052   LDLCALC 142 (H) 01/06/2020 1052   LDLCALC 134 (H) 03/26/2017 0916      Assessment & Plan:   1) Uncontrolled type 2 diabetes mellitus with hyperglycemia ([place])  - [name] has currently uncontrolled symptomatic type 2 DM since 50 years of age.  She presents today with her meter and logs showing above target fasting and postprandial glycemic profile.  Her POCT A1c today is 9.3%, worsening from previous visit of 8.5%.  She reports symptoms of low glucose around lunch time but does not check it when she has those symptoms.  She admitted she forgets to take her insulin from time to time during her busy schedule.  There are no significant episodes of hypoglycemia documented.  She still reports increased stress levels which have interrupted her eating and physical activity.   her diabetes is complicated by coronary artery disease/CHF and she remains at a high risk for more acute and chronic complications which include CAD, CVA, CKD, retinopathy, and neuropathy. These are all discussed in detail with her.  - Nutritional counseling repeated at each appointment due to patients tendency to fall back in to old habits.  - The patient admits there is a room for improvement in their diet and drink choices. -  Suggestion is made for the patient to avoid simple carbohydrates from their diet including Cakes, Sweet Desserts / Pastries, Ice Cream, Soda (diet and regular), Sweet Tea, Candies, Chips, Cookies, Sweet Pastries, Store Bought Juices, Alcohol in Excess of 1-2 drinks a day, Artificial Sweeteners, Coffee Creamer, and "Sugar-free" Products. This will help patient to have stable blood glucose profile and potentially avoid unintended weight gain.   - I encouraged the patient to switch to unprocessed or minimally processed complex starch and increased protein intake (animal or plant source), fruits, and  vegetables.   -  Patient is advised to stick to a routine mealtimes to eat 3 meals a day and avoid unnecessary snacks (to snack only to correct hypoglycemia).  - I have approached her with the following individualized plan to manage diabetes and patient agrees:   -She is advised to be more consistent with taking her insulin.  She is advised to increase her Metformin to 1000 mg po twice daily with meals, continue Glipizide 5 mg po twice daily with meals, and continue her current insulin regimen of Novolog 70/30 75 units with breakfast and 90 units with supper, if glucose is above 90 and she is eating.    -I discussed the possibility of adding incretin therapy such as Trulicity which she would benefit from, however, she would like to hold off for now and re-discuss at next visit.  Dr. Jeffie Pollock, her cardiologist, recommended Wilder Glade or Jardiance due to cardiology benefits from CHF.  I discussed the risks associated with that class of medications and why we typically shy away from those medications.  She has a follow up with Dr. Jeffie Pollock in the next few weeks and will discuss with him.  -She is urged to continue monitoring blood glucose at least three times per day, before injecting insulin (at breakfast and supper) and before bed.  She is encouraged to call the clinic if she has readings less than 70 or greater than 300 for 3 tests in a row.  - Patient specific target  A1c; LDL, HDL, Triglycerides, were discussed in detail.  2) Blood Pressure /Hypertension: Her blood pressure is controlled to target.  She is advised to continue Lasix 20 mg po daily, Metoprolol 100 mg po daily, Spironolactone 25 mg daily, and Sacubitril-Valsartan 97-103 mg po twice daily.  She sees Dr. Haroldine Laws for CHF.  3) Lipids/Hyperlipidemia:  Her most recent lipid panel from 01/06/20 shows uncontrolled LDL of 142 and elevated triglycerides of 177.  She is advised to continue Lipitor 10 mg po daily at bedtime.  Side effects and  precautions discussed with her.  4)  Weight/Diet:  Her Body mass index is 31.17 kg/m.-she is a candidate for weight loss.  I discussed with her the fact that loss of 5 - 10% of her  current body weight will have the most impact on her diabetes management.  CDE Consult will be initiated . Exercise, and detailed carbohydrates information provided  -  detailed on discharge instructions.  5) Chronic Care/Health Maintenance: -she is on ACEI/ARB medications and statin medications and is encouraged to initiate and continue to follow up with Ophthalmology, Dentist,  Podiatrist at least yearly or according to recommendations, and advised to  stay away from smoking. I have recommended yearly flu vaccine and pneumonia vaccine at least every 5 years; moderate intensity exercise for up to 150 minutes weekly; and  sleep for at least 7 hours a day.  - she is  advised to maintain close follow up with Erven Colla, DO for primary care needs, as well as her other providers for optimal and coordinated care.     I spent 40 minutes in the care of the patient today including review of labs from [place], Lipids, Thyroid Function, Hematology (current and previous including abstractions from other facilities); face-to-face time discussing  her blood glucose readings/logs, discussing hypoglycemia and hyperglycemia episodes and symptoms, medications doses, her options of short and long term treatment based on the latest standards of care / guidelines;  discussion about incorporating lifestyle medicine;  and documenting the encounter.  Please refer to Patient Instructions for Blood Glucose Monitoring and Insulin/Medications Dosing Guide"  in media tab for additional information. Please  also refer to " Patient Self Inventory" in the Media  tab for reviewed elements of pertinent patient history.  [name] participated in the discussions, expressed understanding, and voiced agreement with the above plans.  All  questions were answered to her satisfaction. she is encouraged to contact clinic should she have any questions or concerns prior to her return visit.    Follow up plan: - Return in about 3 months (around 11/16/2020).  Rayetta Pigg, [facility] [address] [place], [place] [address] Phone: [phone] Fax: [phone]  08/16/2020, 12:19 PM

## 2020-08-16 NOTE — Patient Instructions (Signed)

## 2020-08-24 NOTE — Progress Notes (Signed)
Remote ICD transmission.   

## 2020-08-31 ENCOUNTER — Encounter (HOSPITAL_COMMUNITY): Payer: Self-pay | Admitting: Internal Medicine

## 2020-08-31 ENCOUNTER — Ambulatory Visit (HOSPITAL_COMMUNITY)
Admission: RE | Admit: 2020-08-31 | Discharge: 2020-08-31 | Disposition: A | Discharge status: Home / Self Care | Payer: 59 | Source: Ambulatory Visit | Attending: Internal Medicine | Admitting: Internal Medicine

## 2020-08-31 ENCOUNTER — Other Ambulatory Visit: Payer: Self-pay

## 2020-08-31 ENCOUNTER — Other Ambulatory Visit (HOSPITAL_COMMUNITY): Payer: Self-pay

## 2020-08-31 VITALS — BP 128/80 | HR 88

## 2020-08-31 DIAGNOSIS — Z4502 Encounter for adjustment and management of automatic implantable cardiac defibrillator: Secondary | ICD-10-CM | POA: Insufficient documentation

## 2020-08-31 DIAGNOSIS — Z95810 Presence of automatic (implantable) cardiac defibrillator: Secondary | ICD-10-CM | POA: Insufficient documentation

## 2020-08-31 DIAGNOSIS — G4733 Obstructive sleep apnea (adult) (pediatric): Secondary | ICD-10-CM | POA: Diagnosis not present

## 2020-08-31 DIAGNOSIS — I272 Pulmonary hypertension, unspecified: Secondary | ICD-10-CM | POA: Diagnosis not present

## 2020-08-31 DIAGNOSIS — I428 Other cardiomyopathies: Secondary | ICD-10-CM | POA: Insufficient documentation

## 2020-08-31 DIAGNOSIS — I5022 Chronic systolic (congestive) heart failure: Secondary | ICD-10-CM | POA: Diagnosis not present

## 2020-08-31 DIAGNOSIS — I2510 Atherosclerotic heart disease of native coronary artery without angina pectoris: Secondary | ICD-10-CM | POA: Diagnosis not present

## 2020-08-31 DIAGNOSIS — Z79899 Other long term (current) drug therapy: Secondary | ICD-10-CM | POA: Insufficient documentation

## 2020-08-31 DIAGNOSIS — E119 Type 2 diabetes mellitus without complications: Secondary | ICD-10-CM | POA: Diagnosis not present

## 2020-08-31 DIAGNOSIS — Z7901 Long term (current) use of anticoagulants: Secondary | ICD-10-CM | POA: Insufficient documentation

## 2020-08-31 DIAGNOSIS — Z8249 Family history of ischemic heart disease and other diseases of the circulatory system: Secondary | ICD-10-CM | POA: Insufficient documentation

## 2020-08-31 DIAGNOSIS — Z794 Long term (current) use of insulin: Secondary | ICD-10-CM | POA: Insufficient documentation

## 2020-08-31 DIAGNOSIS — I110 Hypertensive heart disease with heart failure: Secondary | ICD-10-CM | POA: Diagnosis not present

## 2020-08-31 LAB — BASIC METABOLIC PANEL
Anion gap: 9 (ref 5–15)
BUN: 13 mg/dL (ref 6–20)
CO2: 23 mmol/L (ref 22–32)
Calcium: 8.9 mg/dL (ref 8.9–10.3)
Chloride: 103 mmol/L (ref 98–111)
Creatinine, Ser: 0.76 mg/dL (ref 0.44–1.00)
GFR, Estimated: >60 mL/min (ref >60)
Glucose, Bld: 216 mg/dL — ABNORMAL HIGH (ref 70–99)
Potassium: 4.4 mmol/L (ref 3.5–5.1)
Sodium: 135 mmol/L (ref 135–145)

## 2020-08-31 LAB — BRAIN NATRIURETIC PEPTIDE: B Natriuretic Peptide: 97.9 pg/mL (ref 0.0–100.0)

## 2020-08-31 MED ORDER — DAPAGLIFLOZIN PROPANEDIOL 10 MG PO TABS: 10.0000 mg | ORAL_TABLET | Freq: Every day | ORAL | 1 refills | Status: DC

## 2020-08-31 NOTE — Patient Instructions (Addendum)
Increase  Lasix to 60 mg Twice daily  x 2 days then back to previous dose  Start Farxiga 10 mg(1 tablet) daily  Labs done today, your results will be available in MyChart, we will contact you for abnormal readings.   If you have any questions or concerns before your next appointment please send [place] a message through [place] or call our office at [phone].    TO LEAVE A MESSAGE FOR THE NURSE SELECT OPTION 2, PLEASE LEAVE A MESSAGE INCLUDING: . YOUR NAME . DATE OF BIRTH . CALL BACK NUMBER . REASON FOR CALL**this is important as we prioritize the call backs  [place] AS LONG AS YOU CALL BEFORE 4:00 PM  At the [facility], you and your health needs are our priority. As part of our continuing mission to provide you with exceptional heart care, we have created designated Provider Care Teams. These Care Teams include your primary Cardiologist (physician) and Advanced Practice Providers (APPs- Physician Assistants and Nurse Practitioners) who all work together to provide you with the care you need, when you need it.   You may see any of the following providers on your designated Care Team at your next follow up: Marland Kitchen Dr Glori Bickers . Dr Loralie Champagne . Dr Vickki Muff . Darrick Grinder, NP . Lyda Jester, [place] . Audry Riles, PharmD   Please be sure to bring in all your medications bottles to every appointment.

## 2020-08-31 NOTE — Progress Notes (Signed)
ADVANCED HF CLINIC NOTE  Referring Physician: Dr. 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 Harl Bowie for HF Consultation.   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  Here for routine f/u. Says she has been doing well. Not sleeping very well. Under a lot of stress. Says over past few days has not followed her diet well eating hot dogs and sausage. Weight up 5 pounds Denies SOB, orthopnea or PND. No edema.   ICD interrogation: HL score 0 -> 33. No VT/AF. Activity 1.7hr/day Mean HR 107 -> 100 Personally reviewed  CPX 2/22  FVC 2.82 (74%)    FEV1 2.31 (76%)     FEV1/FVC 82 (102%)     MVV 94 (91%)      Resting HR: 101 Standing HR: 102 Peak HR: 148  (88% age predicted max HR)  BP rest: 134/80 Standing BP: 128/76 BP peak: 196/64  Peak VO2: 21.8 (105% predicted peak VO2)  VE/VCO2 slope: 40  OUES: 1.87  Peak RER: 1.06  Ventilatory Threshold: 17.3 (83% predicted or 79%measured peak VO2)  VE/MVV: 88%  O2pulse: 14  (127% predicted O2pulse)    Past Medical History:  Diagnosis Date  . AICD (automatic cardioverter/defibrillator) present   . Arrhythmia   . Arthritis   . CAD (coronary artery disease)    a. 03/2018: cath showing 60% mid-LAD stenosis not significant by FFR.   Marland Kitchen Chronic  combined systolic (congestive) and diastolic (congestive) heart failure ([place])    a. 03/2018: found to have a newly reduced EF of 20% by echocardiogram  . Depression    denies  . Diabetes mellitus   . GERD (gastroesophageal reflux disease)    h/o of  . Hypertension   . Microproteinuria   . S/P thyroid biopsy 2020   follow up once a year,   . Sleep apnea     Current Outpatient Medications  Medication Sig Dispense Refill  . apixaban (ELIQUIS) 5 MG TABS tablet Take 1 tablet (5 mg total) by mouth 2 (two) times daily. 180 tablet 3  . atorvastatin (LIPITOR) 10 MG tablet Take 1 tablet (10 mg total) by mouth daily. 90 tablet 1  . B-D ULTRAFINE III SHORT PEN 31G X 8 MM MISC USE FOR INSULIN INJECTIONS TWICE DAILY 100 each 3  . benzonatate (TESSALON) 100 MG capsule Take 1 capsule (100 mg total) by mouth 2 (two) times daily as needed for cough. 20 capsule 0  . ENTRESTO 97-103 MG TAKE 1 TABLET BY MOUTH TWICE DAILY 60 tablet 5  . furosemide (LASIX) 40 MG tablet Take 60 mg by mouth daily. Can Do additional 20 mg for SOB    . glipiZIDE (GLUCOTROL) 5 MG tablet TAKE 1 TABLET(5 MG) BY MOUTH TWICE DAILY BEFORE A MEAL 180 tablet 0  . glucose blood (FREESTYLE LITE) test strip Use as instructed 100 each 12  .  insulin isophane & regular human (NOVOLIN 70/30 FLEXPEN) (70-30) 100 UNIT/ML KwikPen INJECT 75 UNITS INTO THE SKIN AT BREAKFAST AND 90 UNITS AT DINNER WHEN GLUCOSE IS ABOVE 90 60 mL 1  . ivabradine (CORLANOR) 5 MG TABS tablet Take 1 tablet (5 mg total) by mouth 2 (two) times daily with a meal. 180 tablet 5  . Lancets (ONETOUCH DELICA PLUS [id number]) MISC USE TO TEST BLOOD SUGAR FOUR TIMES DAILY    . metFORMIN (GLUCOPHAGE) 500 MG tablet Take 2 tablets (1,000 mg total) by mouth 2 (two) times daily with a meal. 360 tablet 3  . metoprolol succinate (TOPROL-XL) 200 MG 24 hr tablet Take 1 tablet (200 mg total) by mouth daily. 90 tablet 3  . NORLYDA 0.35 MG tablet Take 1 tablet by mouth at bedtime.   12  .  spironolactone (ALDACTONE) 25 MG tablet Take 12.5 mg by mouth daily.     No current facility-administered medications for this encounter.    Allergies  Allergen Reactions  . Aspartame And Phenylalanine Dermatitis      Social History   Socioeconomic History  . Marital status: Married    Spouse name: Not on file  . Number of children: Not on file  . Years of education: Not on file  . Highest education level: Not on file  Occupational History  . Not on file  Tobacco Use  . Smoking status: Never Smoker  . Smokeless tobacco: Never Used  Vaping Use  . Vaping Use: Never used  Substance and Sexual Activity  . Alcohol use: No  . Drug use: No  . Sexual activity: Yes    Birth control/protection: Pill  Other Topics Concern  . Not on file  Social History Narrative  . Not on file   Social Determinants of Health   Financial Resource Strain: Not on file  Food Insecurity: Not on file  Transportation Needs: Not on file  Physical Activity: Not on file  Stress: Not on file  Social Connections: Not on file  Intimate Partner Violence: Not on file      Family History  Problem Relation Age of Onset  . Stroke Sister 58       smoker, diabetic, HTN  . Breast cancer Sister   . Cancer Mother        breast  . Breast cancer Mother   . Hypertension Mother   . Diabetes Other   . Heart attack Other   . Chronic Renal Failure Father     Vitals:   08/31/20 1016  BP: 128/80  Pulse: 88  SpO2: 98%    PHYSICAL EXAM: General:  Well appearing. No resp difficulty HEENT: normal Neck: supple. No obvious JVD. Carotids 2+ bilat; no bruits. No lymphadenopathy or thryomegaly appreciated. Cor: PMI nondisplaced. Regular rate & rhythm. No rubs, gallops or murmurs. Lungs: clear Abdomen: soft, nontender, nondistended. No hepatosplenomegaly. No bruits or masses. Good bowel sounds. Extremities: no cyanosis, clubbing, rash, tr-1+ edema Neuro: alert & orientedx3, cranial nerves grossly intact.  moves all 4 extremities w/o difficulty. 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 Personally reviewed - 10/29/18 ICD implant[employer]  - CPX 3/22 pVO2: 21.8 (105% predicted peak VO2) slope 40 RER: 1.06 VE/MVV: 88%  - Remains NYHA II-early III at the worst - ICD interrogated personally Score 33. Volume up No VT/AF.  - Doubles lasix to 60 bid for 2 days. Watch intake. Enroll in [facility] clinic with Sharman Cheek RN - Continue Entresto to 97/103 bid - Continue Toprol 200 daily.  - Continue spiro 12.5 daily (didn't tolerate 25mg  due to dizziness) - Start Farxiga 10  - Referred for Genetics testing but couldn't afford. Has one daughter and had echo at [facility] which was ok. Has a sister and a brother both who have kids. She will discuss with her brother.   2. Pulmonary hypertension - likely mixed picture  3. CAD - moderate nonobstructive disease by cath - no s/s angina   4. DM2 - start Farxiga 10  5. OSA - Sleep study 10/20 AHI 18 c/w moderate OSA - Followed by Dr. Reather Laurence, MD  10:51 AM

## 2020-09-06 ENCOUNTER — Telehealth: Payer: Self-pay

## 2020-09-06 NOTE — Telephone Encounter (Signed)
Patient referred to [facility] clinic by Dr [name].  Attempted ICM intro call and left message with phone number for return call.

## 2020-09-06 NOTE — Telephone Encounter (Signed)
-----   Message from Stanford Scotland, RN sent at 08/31/2020  2:20 PM EDT ----- Dr. Haroldine Laws would like you to follow this patient in the device clinic. She has a Product/process development scientist. Thank you.      Madlyn Frankel RN [facility]

## 2020-09-13 NOTE — Telephone Encounter (Signed)
Spoke with patient and provided ICM intro.  She agreed to monthly ICM follow up.  She took extra furosemide as instructed by Dr Haroldine Laws at 08/31/2020 for fluid accumulation and feels better.  She is physically doing fine by going through a very stressful home situation at this time.  She will be moving to her mother's home in the next 30 days.  Advised scheduled ICM remote transmission for 09/25/2020 and will follow up with her at that time.  She has ICM direct number and encouraged to call for fluid symptoms or concerns.

## 2020-09-13 NOTE — Telephone Encounter (Signed)
Attempted ICM intro Call to patient and left message with phone number to return call.

## 2020-09-25 ENCOUNTER — Ambulatory Visit (INDEPENDENT_AMBULATORY_CARE_PROVIDER_SITE_OTHER): Payer: 59

## 2020-09-25 DIAGNOSIS — Z95810 Presence of automatic (implantable) cardiac defibrillator: Secondary | ICD-10-CM | POA: Diagnosis not present

## 2020-09-25 DIAGNOSIS — I5022 Chronic systolic (congestive) heart failure: Secondary | ICD-10-CM | POA: Diagnosis not present

## 2020-09-26 ENCOUNTER — Telehealth: Payer: Self-pay

## 2020-09-26 NOTE — Progress Notes (Signed)
EPIC Encounter for ICM Monitoring  Patient Name: Tara Bryant is a 50 y.o. female Date: 09/26/2020 Primary Care Physican: Erven Colla, DO Primary Cardiologist: [place] Electrophysiologist: Lovena Le 08/16/2020 Weight: 199 lbs     1st ICM Remote Transmission.  Attempted call to patient and unable to reach.  Left message to return call. Transmission reviewed.    09/24/2020 HeartLogic Heart Failure Index is 32 suggesting fluid accumulation starting 09/15/2020.  Prescribed:  Furosemide 40 mg Take 60 mg by mouth daily. Can Do additional 20 mg for SOB Spironolactone 25 mg take 0.5 tablet (12.5 mg total) by mouth daily.  Recommendations: Unable to reach.  Copy sent to Dr Haroldine Laws for review and recommendations if needed.  Follow-up plan: ICM clinic phone appointment on 10/02/2020 to recheck fluid levels.   91 day device clinic remote transmission 11/03/2020.             EP/Cardiology next office visit: 10/03/2020 with [facility]         Copy of ICM check sent to Dr. Lovena Le.  3 Month Trend    8 Day Data Trend                        Rosalene Billings, RN 09/26/2020 7:49 AM

## 2020-09-26 NOTE — Progress Notes (Signed)
Received call back from patient.   She is taking Furosemide about 3 times a week due to her moving from her home to her mothers.  She denies any fluid symptoms at this time.  Encouraged her take Furosemide as prescribed if possible to help with fluid accumulation.  Advised report sent to Dr Haroldine Laws and if any recommendations will call back.

## 2020-09-26 NOTE — Telephone Encounter (Signed)
Remote ICM transmission received.  Attempted call to patient regarding ICM remote transmission and left message to return call   

## 2020-10-02 ENCOUNTER — Telehealth: Payer: Self-pay

## 2020-10-02 ENCOUNTER — Ambulatory Visit (INDEPENDENT_AMBULATORY_CARE_PROVIDER_SITE_OTHER): Payer: 59

## 2020-10-02 DIAGNOSIS — I5022 Chronic systolic (congestive) heart failure: Secondary | ICD-10-CM

## 2020-10-02 DIAGNOSIS — Z95810 Presence of automatic (implantable) cardiac defibrillator: Secondary | ICD-10-CM

## 2020-10-02 NOTE — Progress Notes (Signed)
EPIC Encounter for ICM Monitoring  Patient Name: Tara Bryant is a 50 y.o. female Date: 10/02/2020 Primary Care Physican: Erven Colla, DO Primary Cardiologist: [place] Electrophysiologist: Lovena Le 08/16/2020 Weight: 199 lbs                                Attempted call to patient and unable to reach.  Left message to return call. Transmission reviewed.   10/01/2020 HeartLogic Heart Failure Index decreased from 32 to 4 suggesting fluid levels returned to normal after taking Furosemide as prescribed.   Prescribed: Furosemide 40 mg Take 60 mg by mouth daily. Can Do additional 20 mg for SOB Spironolactone 25 mg take 0.5 tablet (12.5 mg total) by mouth daily.   Recommendations:  Unable to reach.     Follow-up plan: ICM clinic phone appointment on 11/06/2020.   91 day device clinic remote transmission 11/03/2020.              EP/Cardiology next office visit: 10/03/2020 with [facility]          Copy of ICM check sent to Dr. Lovena Le.  3 month ICM trend: 10/01/2020.    8 day trend:       Rosalene Billings, RN 10/02/2020 3:36 PM

## 2020-10-02 NOTE — Telephone Encounter (Signed)
Remote ICM transmission received.  Attempted call to patient regarding ICM remote transmission and left message to return call   

## 2020-10-03 ENCOUNTER — Encounter (HOSPITAL_COMMUNITY): Payer: 59

## 2020-10-03 NOTE — Progress Notes (Signed)
Spoke with patient.  She is doing well and remote transmission reviewed.  Advised to continue taking Furosemide as ordered.  She is in the process of moving and will be using her cell phone for future calls.  No changes and encouraged to call if experiencing any fluid symptoms.

## 2020-11-03 ENCOUNTER — Ambulatory Visit (INDEPENDENT_AMBULATORY_CARE_PROVIDER_SITE_OTHER): Payer: 59

## 2020-11-03 DIAGNOSIS — I428 Other cardiomyopathies: Secondary | ICD-10-CM

## 2020-11-04 LAB — CUP PACEART REMOTE DEVICE CHECK
Battery Remaining Longevity: 162 mo
Battery Remaining Percentage: 100 %
Brady Statistic RV Percent Paced: 0 %
Date Time Interrogation Session: 20220729044100
HighPow Impedance: 78 Ohm
Implantable Lead Implant Date: 20200723
Implantable Lead Location: 753860
Implantable Lead Model: 293
Implantable Lead Serial Number: 443823
Implantable Pulse Generator Implant Date: 20200723
Lead Channel Impedance Value: 538 Ohm
Lead Channel Setting Pacing Amplitude: 3.5 V
Lead Channel Setting Pacing Pulse Width: 0.4 ms
Lead Channel Setting Sensing Sensitivity: 0.5 mV
Pulse Gen Serial Number: 256025

## 2020-11-04 IMAGING — MR MR CERVICAL SPINE W/O CM
4 of 5 series · 18 of 48 positions shown · non-contrast
Comparison: Prior radiograph from 09/28/2019.

CLINICAL DATA: Initial evaluation for chronic neck pain.

EXAM:
MRI CERVICAL SPINE WITHOUT CONTRAST
TECHNIQUE: Multiplanar, multisequence MR imaging of the cervical spine was
performed. No intravenous contrast was administered.

[Series 3: T2 · sagittal · 3.0mm · 0.43mm/px · 7 of 16 slices shown (1 of 2)]
[im 1/16]
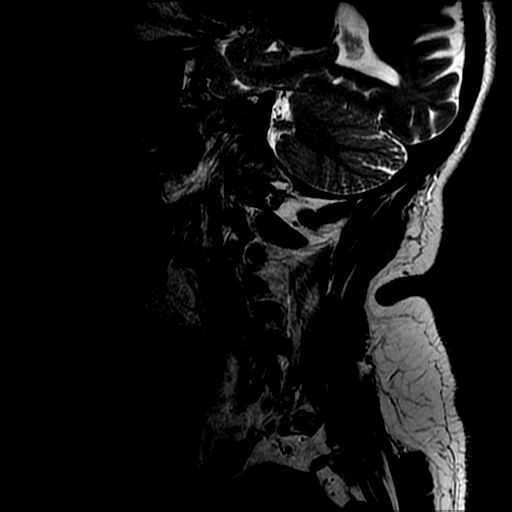
[im 3/16]
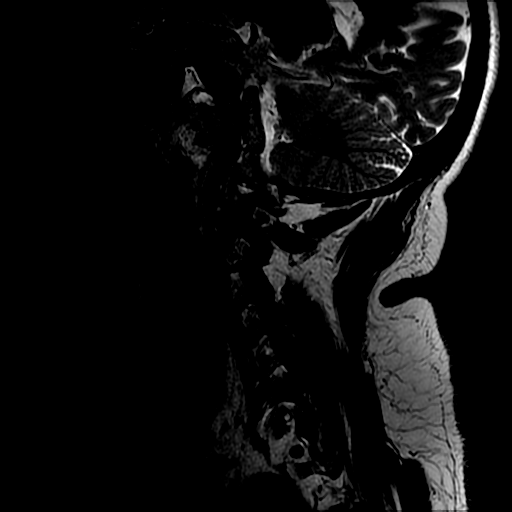
[im 6/16]
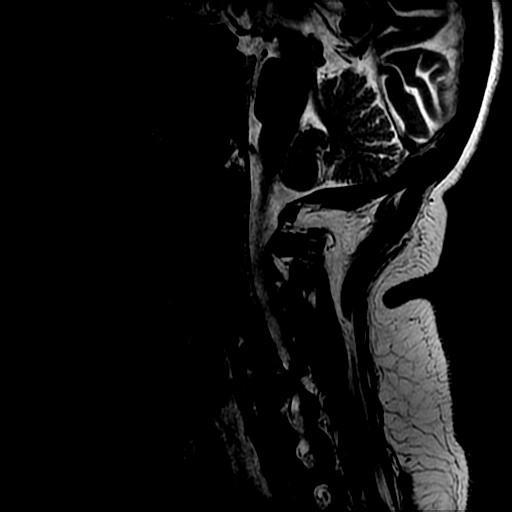
[im 8/16]
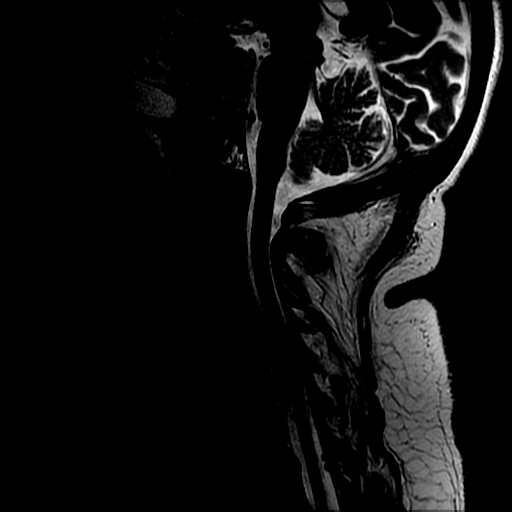
[im 11/16]
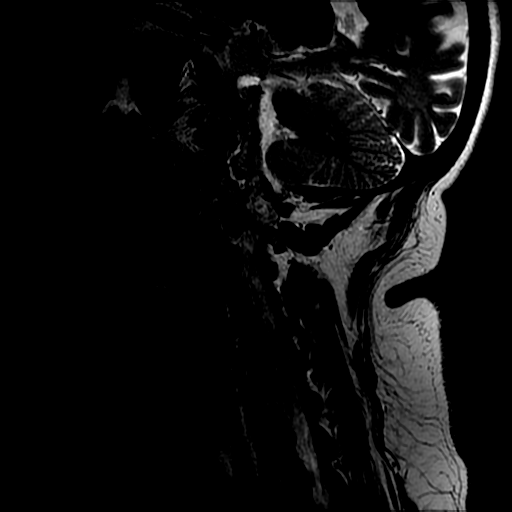
[im 13/16]
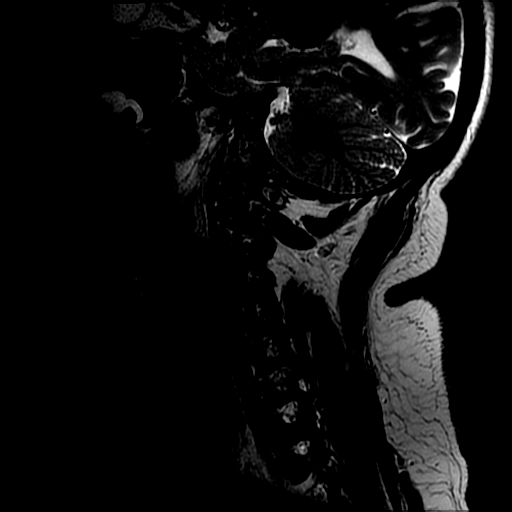
[im 16/16]
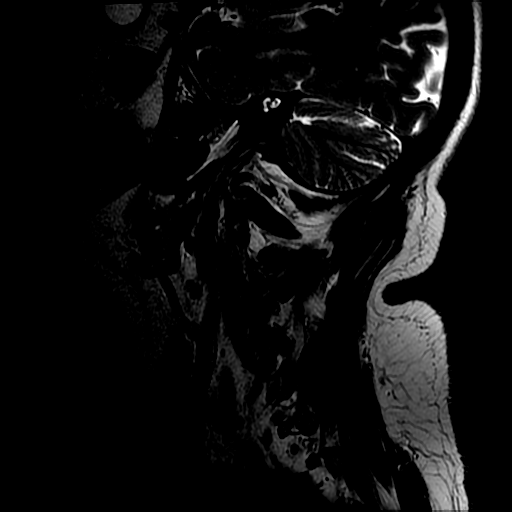

[Series 4: sag ir · sagittal · 3.0mm · 0.43mm/px · 3 of 16 slices shown]
[im 3/16]
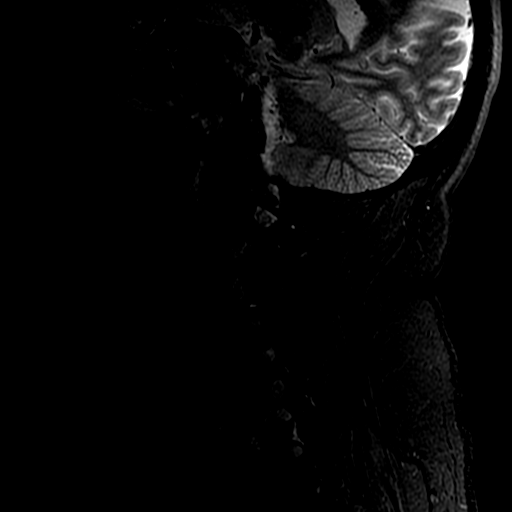
[im 8/16]
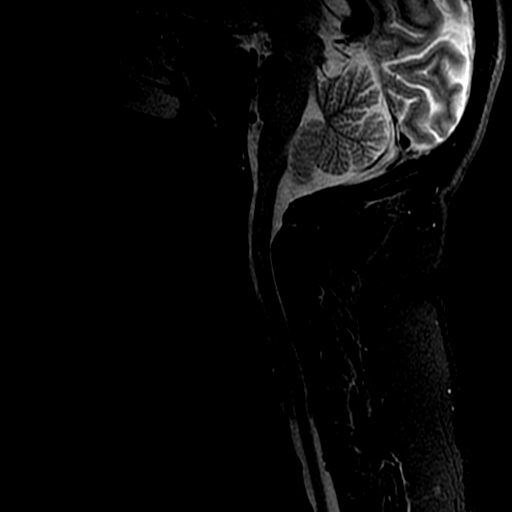
[im 13/16]
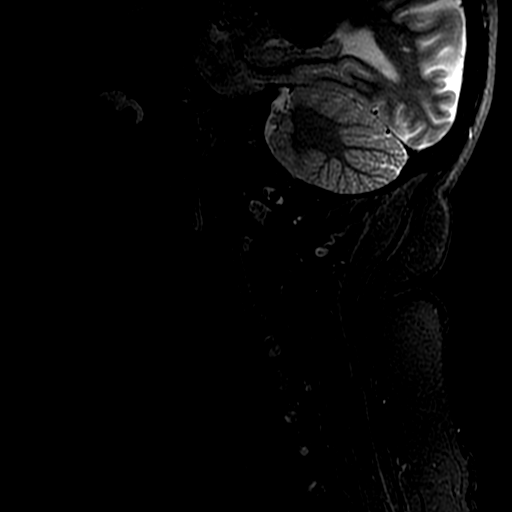

[Series 5: T1 · sagittal · 3.0mm · 0.43mm/px · 3 of 16 slices shown]
[im 3/16]
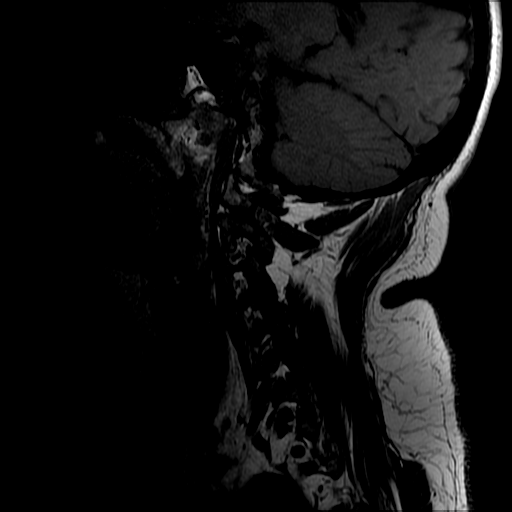
[im 9/16]
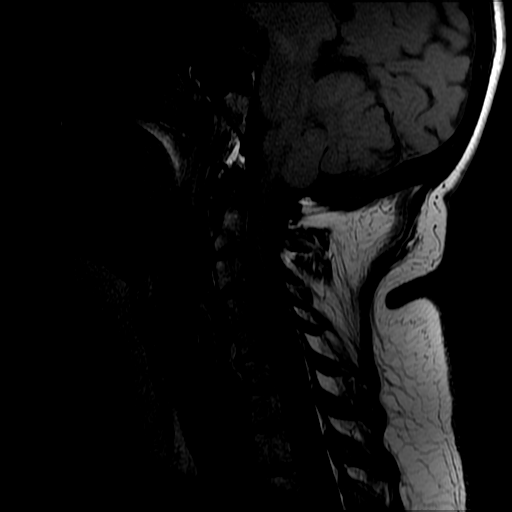
[im 13/16]
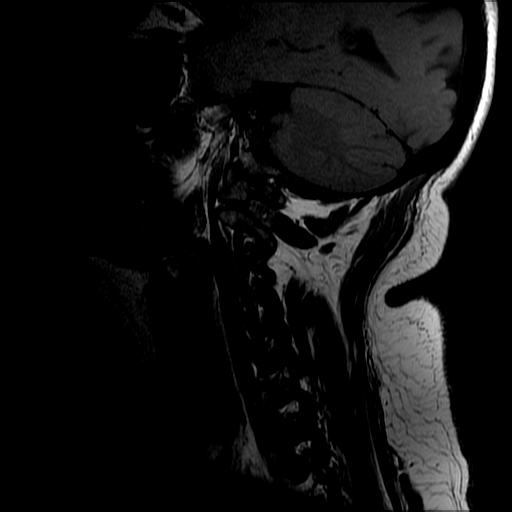

[Series 7: T2 · axial · 3.0mm · 0.39mm/px · z∈[-53,+8]mm · 5 of 27 slices shown (2 of 2)]
[im 1/27]
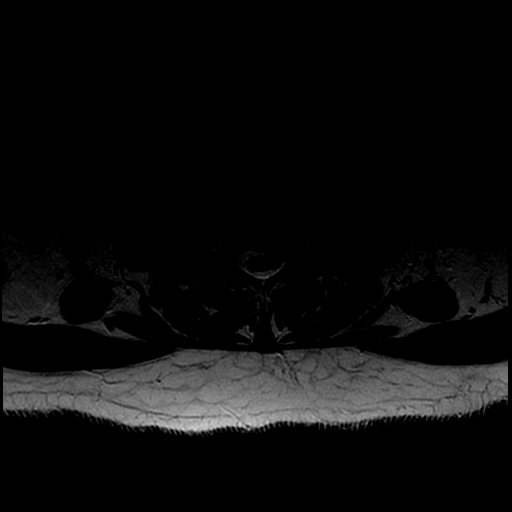
[im 5/27]
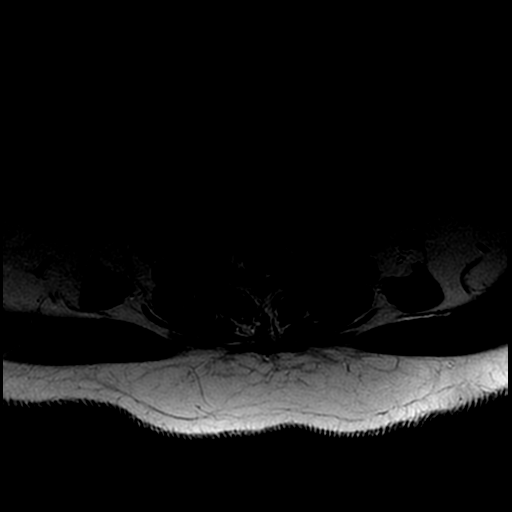
[im 9/27]
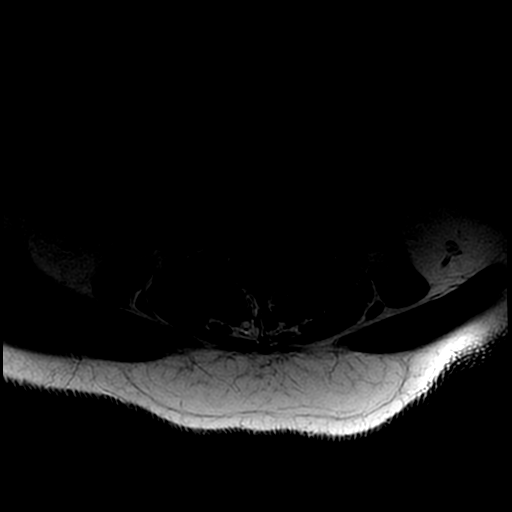
[im 14/27]
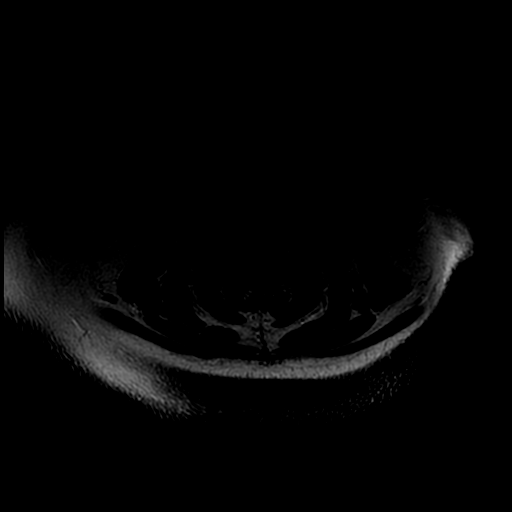
[im 22/27]
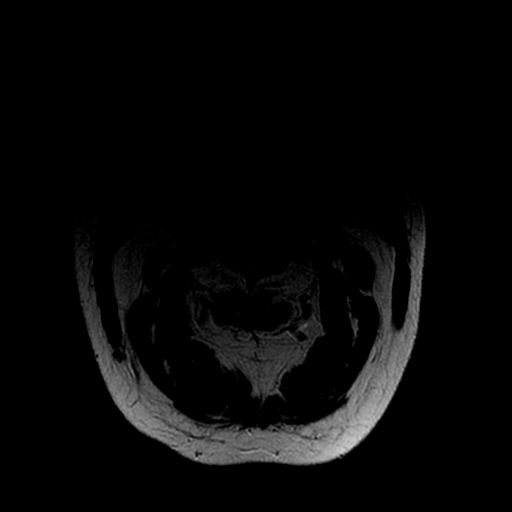

[18 of 48 positions shown; findings below may reference images not displayed]

FINDINGS: Alignment: Straightening with reversal of the normal cervical
lordosis. No listhesis.

Vertebrae: Vertebral body height maintained without evidence for
acute or chronic fracture. Bone marrow signal intensity within
normal limits without discrete or worrisome osseous lesions. No
abnormal marrow edema.

Cord: Signal intensity within the cervical spinal cord is normal.

Posterior Fossa, vertebral arteries, paraspinal tissues: Visualized
brain and posterior fossa within normal limits. Craniocervical
junction normal. Paraspinous and prevertebral soft tissues within
normal limits. Normal intravascular flow voids seen within the
vertebral arteries bilaterally. 4 mm T2 hyperintense nodule noted
within the right lobe of thyroid (series 7, image 15). Per history,
patient has recently undergone thyroid ultrasound and biopsy.

Disc levels:

C2-C3: Unremarkable.

C3-C4:  Unremarkable.

C4-C5: Broad-based right paracentral disc protrusion flattens and
partially faces the ventral thecal sac, greater on the right. Mild
spinal stenosis with mild flattening of the ventral spinal cord.
Foramina remain patent.

C5-C6: Diffuse disc bulge, asymmetric to the right. Broad-based
posterior right paracentral disc protrusion flattens and partially
effaces the ventral thecal sac. Secondary mild spinal stenosis with
mild cord flattening. Foramina remain patent.

C6-C7: Diffuse disc bulge with bilateral uncovertebral hypertrophy.
Superimposed moderate-sized right subarticular to foraminal disc
herniation (series 6, image 21). Protruding disc largely effaces the
right ventral and lateral thecal sac with resultant mild spinal
stenosis. Severe right C7 foraminal narrowing. Left neural foramina
remains patent.

C7-T1:  Unremarkable.

Visualized upper thoracic spine demonstrates minimal disc bulge at
T1-2 without stenosis.
IMPRESSION: 1. Moderate-sized right subarticular to foraminal disc herniation at
C6-7 with resultant mild spinal stenosis, with severe right C7
foraminal narrowing.
2. Right paracentral disc protrusions at C4-5 and C5-6 with
resultant mild spinal stenosis and cord flattening.

## 2020-11-06 ENCOUNTER — Ambulatory Visit (INDEPENDENT_AMBULATORY_CARE_PROVIDER_SITE_OTHER): Payer: 59

## 2020-11-06 DIAGNOSIS — I5022 Chronic systolic (congestive) heart failure: Secondary | ICD-10-CM

## 2020-11-06 DIAGNOSIS — Z95810 Presence of automatic (implantable) cardiac defibrillator: Secondary | ICD-10-CM | POA: Diagnosis not present

## 2020-11-08 ENCOUNTER — Telehealth: Payer: Self-pay

## 2020-11-08 NOTE — Progress Notes (Signed)
EPIC Encounter for ICM Monitoring  Patient Name: Tara Bryant is a 50 y.o. female Date: 11/08/2020 Primary Care Physican: Erven Colla, DO Primary Cardiologist: [place] Electrophysiologist: Lovena Le 08/16/2020 Weight: 199 lbs                                Attempted call to patient and unable to reach.  Left message to return call. Transmission reviewed.   HeartLogic Heart Failure Index 0 suggesting normal fluid levels.   Prescribed: Furosemide 40 mg Take 60 mg by mouth daily. Can Do additional 20 mg for SOB Spironolactone 25 mg take 0.5 tablet (12.5 mg total) by mouth daily.   Recommendations:  Unable to reach.     Follow-up plan: ICM clinic phone appointment on 12/18/2020.   91 day device clinic remote transmission 02/02/2021.              EP/Cardiology next office visit: 10/03/2020 with [facility] was canceled and needs to reschedule.          Copy of ICM check sent to Dr. Lovena Le.  3 Month Trend    8 Day Data Trend          Rosalene Billings, RN 11/08/2020 10:48 AM

## 2020-11-08 NOTE — Progress Notes (Signed)
Spoke with patient and heart failure questions reviewed.  Pt asymptomatic for fluid accumulation and feeling well.  No changes and encouraged to call if experiencing any fluid symptoms.

## 2020-11-08 NOTE — Telephone Encounter (Signed)
Remote ICM transmission received.  Attempted call to patient regarding ICM remote transmission and left message per DPR to return call.   

## 2020-11-10 ENCOUNTER — Other Ambulatory Visit (HOSPITAL_COMMUNITY)
Admission: RE | Admit: 2020-11-10 | Discharge: 2020-11-10 | Disposition: A | Discharge status: Home / Self Care | Payer: 59 | Source: Ambulatory Visit | Attending: Nurse Practitioner | Admitting: Nurse Practitioner

## 2020-11-10 ENCOUNTER — Other Ambulatory Visit: Payer: Self-pay

## 2020-11-10 DIAGNOSIS — E559 Vitamin D deficiency, unspecified: Secondary | ICD-10-CM | POA: Diagnosis present

## 2020-11-10 DIAGNOSIS — E1165 Type 2 diabetes mellitus with hyperglycemia: Secondary | ICD-10-CM | POA: Diagnosis present

## 2020-11-10 LAB — COMPREHENSIVE METABOLIC PANEL
ALT: 18 U/L (ref 0–44)
AST: 14 U/L — ABNORMAL LOW (ref 15–41)
Albumin: 4 g/dL (ref 3.5–5.0)
Alkaline Phosphatase: 73 U/L (ref 38–126)
Anion gap: 10 (ref 5–15)
BUN: 15 mg/dL (ref 6–20)
CO2: 21 mmol/L — ABNORMAL LOW (ref 22–32)
Calcium: 8.2 mg/dL — ABNORMAL LOW (ref 8.9–10.3)
Chloride: 100 mmol/L (ref 98–111)
Creatinine, Ser: 0.73 mg/dL (ref 0.44–1.00)
GFR, Estimated: >60 mL/min (ref >60)
Glucose, Bld: 387 mg/dL — ABNORMAL HIGH (ref 70–99)
Potassium: 3.8 mmol/L (ref 3.5–5.1)
Sodium: 131 mmol/L — ABNORMAL LOW (ref 135–145)
Total Bilirubin: 0.8 mg/dL (ref 0.3–1.2)
Total Protein: 7.5 g/dL (ref 6.5–8.1)

## 2020-11-10 LAB — TSH: TSH: 2.585 u[IU]/mL (ref 0.350–4.500)

## 2020-11-10 LAB — LIPID PANEL
Cholesterol: 219 mg/dL — ABNORMAL HIGH (ref 0–200)
HDL: 32 mg/dL — ABNORMAL LOW (ref >40)
LDL Cholesterol: 145 mg/dL — ABNORMAL HIGH (ref 0–99)
Total CHOL/HDL Ratio: 6.8 RATIO
Triglycerides: 208 mg/dL — ABNORMAL HIGH (ref <150)
VLDL: 42 mg/dL — ABNORMAL HIGH (ref 0–40)

## 2020-11-10 LAB — T4, FREE: Free T4: 1.06 ng/dL (ref 0.61–1.12)

## 2020-11-10 LAB — VITAMIN D 25 HYDROXY (VIT D DEFICIENCY, FRACTURES): Vit D, 25-Hydroxy: 25.18 ng/mL — ABNORMAL LOW (ref 30–100)

## 2020-11-13 ENCOUNTER — Other Ambulatory Visit: Payer: Self-pay | Admitting: "Endocrinology

## 2020-11-13 DIAGNOSIS — E1165 Type 2 diabetes mellitus with hyperglycemia: Secondary | ICD-10-CM

## 2020-11-16 ENCOUNTER — Ambulatory Visit: Payer: 59 | Admitting: Nurse Practitioner

## 2020-11-20 ENCOUNTER — Other Ambulatory Visit: Payer: Self-pay

## 2020-11-20 ENCOUNTER — Telehealth: Payer: Self-pay | Admitting: Family Medicine

## 2020-11-20 ENCOUNTER — Telehealth (INDEPENDENT_AMBULATORY_CARE_PROVIDER_SITE_OTHER): Payer: 59 | Admitting: Family Medicine

## 2020-11-20 DIAGNOSIS — U071 COVID-19: Secondary | ICD-10-CM

## 2020-11-20 NOTE — Progress Notes (Addendum)
Virtual Visit via Telephone Note  I connected with Tara Bryant on 12/11/20 at 10:40 AM EDT by telephone and verified that I am speaking with the correct person using two identifiers.  Location: Patient: home Provider: office   I discussed the limitations, risks, security and privacy concerns of performing an evaluation and management service by telephone and the availability of in person appointments. I also discussed with the patient that there may be a patient responsible charge related to this service. The patient expressed understanding and agreed to proceed.     [name], female    DOB: 10-02-1970, 50 y.o.   MRN: [id number]   Chief Complaint  Patient presents with   Covid Positive   Subjective:    HPI  Positive 9 days ago.  Pt was COVID positive on 11/11/20. Has not tested since to see if she is still positive. Having ongoing yellow sinus drainage. History of CHF. Pt states she was having breathing issues last week but believes it was due to heat and being stuffy.   Has some sinus drainage. No central air and has ceiling fan and has increased heat this week due to moving. Not using anything otc. Coughing occ yellow- to greenish. Not checked to see if has a fever.  Medical History Tannah has a past medical history of AICD (automatic cardioverter/defibrillator) present, Arrhythmia, Arthritis, CAD (coronary artery disease), Chronic combined systolic (congestive) and diastolic (congestive) heart failure ([place]), Depression, Diabetes mellitus, GERD (gastroesophageal reflux disease), Hypertension, Microproteinuria, S/P thyroid biopsy (2020), and Sleep apnea.   Outpatient Encounter Medications as of 11/20/2020  Medication Sig   apixaban (ELIQUIS) 5 MG TABS tablet Take 1 tablet (5 mg total) by mouth 2 (two) times daily.   atorvastatin (LIPITOR) 10 MG tablet Take 1 tablet (10 mg total) by mouth daily.   B-D ULTRAFINE III SHORT PEN 31G X 8 MM MISC USE FOR  INSULIN INJECTIONS TWICE DAILY   benzonatate (TESSALON) 100 MG capsule Take 1 capsule (100 mg total) by mouth 2 (two) times daily as needed for cough.   dapagliflozin propanediol (FARXIGA) 10 MG TABS tablet Take 1 tablet (10 mg total) by mouth daily.   ENTRESTO 97-103 MG TAKE 1 TABLET BY MOUTH TWICE DAILY   furosemide (LASIX) 40 MG tablet Take 60 mg by mouth daily. Can Do additional 20 mg for SOB   glipiZIDE (GLUCOTROL) 5 MG tablet TAKE 1 TABLET(5 MG) BY MOUTH TWICE DAILY BEFORE A MEAL   glucose blood (FREESTYLE LITE) test strip Use as instructed   ivabradine (CORLANOR) 5 MG TABS tablet Take 1 tablet (5 mg total) by mouth 2 (two) times daily with a meal.   Lancets (ONETOUCH DELICA PLUS 123XX123) MISC USE TO TEST BLOOD SUGAR FOUR TIMES DAILY   metFORMIN (GLUCOPHAGE) 500 MG tablet Take 2 tablets (1,000 mg total) by mouth 2 (two) times daily with a meal.   metoprolol succinate (TOPROL-XL) 200 MG 24 hr tablet Take 1 tablet (200 mg total) by mouth daily.   NORLYDA 0.35 MG tablet Take 1 tablet by mouth at bedtime.    NOVOLIN 70/30 KWIKPEN (70-30) 100 UNIT/ML KwikPen INJECT 75 UNITS INTO THE SKIN AT BREAKFAST AND 90 UNITS AT DINNER WHEN GLUCOSE IS ABOVE 90   spironolactone (ALDACTONE) 25 MG tablet Take 12.5 mg by mouth daily.   No facility-administered encounter medications on file as of 11/20/2020.     Review of Systems  Constitutional:  Negative for chills and fever.  HENT:  Positive  for rhinorrhea. Negative for congestion and sore throat.   Respiratory:  Positive for cough. Negative for shortness of breath and wheezing.   Cardiovascular:  Negative for chest pain and leg swelling.  Gastrointestinal:  Negative for abdominal pain, diarrhea, nausea and vomiting.  Genitourinary:  Negative for dysuria and frequency.  Musculoskeletal:  Negative for arthralgias and back pain.  Skin:  Negative for rash.  Neurological:  Negative for dizziness, weakness and headaches.    Vitals There were no vitals  taken for this visit.  Objective:   Physical Exam No PE due to phone visit.  Assessment and Plan   1. COVID-19  -pt to do symptomatic treatment. -pt call or rto if worsening symptoms.  Cont to increase fluids.    No follow-ups on file.     Follow Up Instructions:    I discussed the assessment and treatment plan with the patient. The patient was provided an opportunity to ask questions and all were answered. The patient agreed with the plan and demonstrated an understanding of the instructions.   The patient was advised to call back or seek an in-person evaluation if the symptoms worsen or if the condition fails to improve as anticipated.  I provided  13 minutes of non-face-to-face time during this encounter.

## 2020-11-20 NOTE — Telephone Encounter (Signed)
Ms. tywanda, lehnert are scheduled for a virtual visit with your provider today.    Just as we do with appointments in the office, we must obtain your consent to participate.  Your consent will be active for this visit and any virtual visit you may have with one of our providers in the next 365 days.    If you have a MyChart account, I can also send a copy of this consent to you electronically.  All virtual visits are billed to your insurance company just like a traditional visit in the office.  As this is a virtual visit, video technology does not allow for your provider to perform a traditional examination.  This may limit your provider's ability to fully assess your condition.  If your provider identifies any concerns that need to be evaluated in person or the need to arrange testing such as labs, EKG, etc, we will make arrangements to do so.    Although advances in technology are sophisticated, we cannot ensure that it will always work on either your end or our end.  If the connection with a video visit is poor, we may have to switch to a telephone visit.  With either a video or telephone visit, we are not always able to ensure that we have a secure connection.   I need to obtain your verbal consent now.   Are you willing to proceed with your visit today?   MAIZEY HELIN has provided verbal consent on 11/20/2020 for a virtual visit (video or telephone).   Vicente Males, LPN [id number]  [id number]

## 2020-11-29 NOTE — Progress Notes (Signed)
Remote ICD transmission.   

## 2020-12-05 ENCOUNTER — Ambulatory Visit: Payer: 59 | Admitting: Nurse Practitioner

## 2020-12-11 ENCOUNTER — Encounter: Payer: Self-pay | Admitting: Family Medicine

## 2020-12-12 ENCOUNTER — Encounter: Payer: Self-pay | Admitting: Nurse Practitioner

## 2020-12-12 ENCOUNTER — Ambulatory Visit (INDEPENDENT_AMBULATORY_CARE_PROVIDER_SITE_OTHER): Payer: 59 | Admitting: Nurse Practitioner

## 2020-12-12 ENCOUNTER — Other Ambulatory Visit: Payer: Self-pay

## 2020-12-12 VITALS — BP 123/81 | HR 88 | Ht 67.5 in | Wt 188.0 lb

## 2020-12-12 DIAGNOSIS — E1165 Type 2 diabetes mellitus with hyperglycemia: Secondary | ICD-10-CM | POA: Diagnosis not present

## 2020-12-12 LAB — POCT GLYCOSYLATED HEMOGLOBIN (HGB A1C): Hemoglobin A1C: 12.5 % — AB (ref 4.0–5.6)

## 2020-12-12 NOTE — Progress Notes (Signed)
12/12/2020, 12:16 PM  Endocrinology follow-up note                              Subjective:    Patient ID: PEYTAN FELGAR, female    DOB: 1970-11-30.  Tara Bryant is being seen in follow-up in  management of currently uncontrolled symptomatic diabetes requested by  Erven Colla, DO.   Past Medical History:  Diagnosis Date   AICD (automatic cardioverter/defibrillator) present    Arrhythmia    Arthritis    CAD (coronary artery disease)    a. 03/2018: cath showing 60% mid-LAD stenosis not significant by FFR.    Chronic combined systolic (congestive) and diastolic (congestive) heart failure ([place])    a. 03/2018: found to have a newly reduced EF of 20% by echocardiogram   Depression    denies   Diabetes mellitus    GERD (gastroesophageal reflux disease)    h/o of   Hypertension    Microproteinuria    S/P thyroid biopsy 2020   follow up once a year,    Sleep apnea    Past Surgical History:  Procedure Laterality Date   ANTERIOR CERVICAL DECOMP/DISCECTOMY FUSION N/A 12/03/2019   Procedure: ANTERIOR CERVICAL DECOMPRESSION/DISCECTOMY FUSION CERVICAL SIX- CERVICAL SEVEN;  Surgeon: Consuella Lose, MD;  Location: [facility];  Service: Neurosurgery;  Laterality: N/A;  ANTERIOR CERVICAL DECOMPRESSION/DISCECTOMY FUSION CERVICAL SIX- CERVICAL SEVEN   BACK SURGERY  2010   BREAST BIOPSY Left 2017   benign   CARDIAC CATHETERIZATION     ICD IMPLANT N/A 10/29/2018   Procedure: ICD IMPLANT;  Surgeon: Evans Lance, MD;  Location: [facility];  Service: Cardiovascular;  Laterality: N/A;   INTRAVASCULAR PRESSURE WIRE/FFR STUDY N/A 03/24/2018   Procedure: INTRAVASCULAR PRESSURE WIRE/FFR STUDY;  Surgeon: Leonie Man, MD;  Location: [facility];  Service: Cardiovascular;  Laterality: N/A;   NOSE SURGERY     RIGHT/LEFT HEART CATH AND CORONARY ANGIOGRAPHY N/A 03/24/2018   Procedure: RIGHT/LEFT HEART  CATH AND CORONARY ANGIOGRAPHY;  Surgeon: Leonie Man, MD;  Location: [facility];  Service: Cardiovascular;  Laterality: N/A;   TONSILLECTOMY     WISDOM TOOTH EXTRACTION     Social History   Socioeconomic History   Marital status: Married    Spouse name: Not on file   Number of children: Not on file   Years of education: Not on file   Highest education level: Not on file  Occupational History   Not on file  Tobacco Use   Smoking status: Never   Smokeless tobacco: Never  Vaping Use   Vaping Use: Never used  Substance and Sexual Activity   Alcohol use: No   Drug use: No   Sexual activity: Yes    Birth control/protection: Pill  Other Topics Concern   Not on file  Social History Narrative   Not on file   Social Determinants of Health   Financial Resource Strain: Not on file  Food Insecurity: Not on file  Transportation Needs: Not on file  Physical Activity: Not on file  Stress: Not  on file  Social Connections: Not on file   Outpatient Encounter Medications as of 12/12/2020  Medication Sig   apixaban (ELIQUIS) 5 MG TABS tablet Take 1 tablet (5 mg total) by mouth 2 (two) times daily.   atorvastatin (LIPITOR) 10 MG tablet Take 1 tablet (10 mg total) by mouth daily.   benzonatate (TESSALON) 100 MG capsule Take 1 capsule (100 mg total) by mouth 2 (two) times daily as needed for cough.   ENTRESTO 97-103 MG TAKE 1 TABLET BY MOUTH TWICE DAILY   furosemide (LASIX) 40 MG tablet Take 60 mg by mouth daily. Can Do additional 20 mg for SOB   glipiZIDE (GLUCOTROL) 5 MG tablet TAKE 1 TABLET(5 MG) BY MOUTH TWICE DAILY BEFORE A MEAL   ivabradine (CORLANOR) 5 MG TABS tablet Take 1 tablet (5 mg total) by mouth 2 (two) times daily with a meal.   metFORMIN (GLUCOPHAGE) 500 MG tablet Take 2 tablets (1,000 mg total) by mouth 2 (two) times daily with a meal.   metoprolol succinate (TOPROL-XL) 200 MG 24 hr tablet Take 1 tablet (200 mg total) by mouth daily.   NORLYDA 0.35 MG tablet Take  1 tablet by mouth at bedtime.    NOVOLIN 70/30 KWIKPEN (70-30) 100 UNIT/ML KwikPen INJECT 75 UNITS INTO THE SKIN AT BREAKFAST AND 90 UNITS AT DINNER WHEN GLUCOSE IS ABOVE 90   spironolactone (ALDACTONE) 25 MG tablet Take 12.5 mg by mouth daily.   B-D ULTRAFINE III SHORT PEN 31G X 8 MM MISC USE FOR INSULIN INJECTIONS TWICE DAILY   dapagliflozin propanediol (FARXIGA) 10 MG TABS tablet Take 1 tablet (10 mg total) by mouth daily. (Patient not taking: Reported on 12/12/2020)   glucose blood (FREESTYLE LITE) test strip Use as instructed   Lancets (ONETOUCH DELICA PLUS 123XX123) MISC USE TO TEST BLOOD SUGAR FOUR TIMES DAILY   No facility-administered encounter medications on file as of 12/12/2020.    ALLERGIES: Allergies  Allergen Reactions   Aspartame And Phenylalanine Dermatitis    VACCINATION STATUS: Immunization History  Administered Date(s) Administered   Influenza Split 02/02/2013   Influenza,inj,Quad PF,6+ Mos 01/11/2014, 12/15/2014, 12/15/2015, 01/03/2017, 01/28/2018, 02/03/2019, 02/17/2020   Influenza-Unspecified 03/08/2011   Pneumococcal Polysaccharide-23 03/30/2009   Td 02/04/2006   Tdap 02/26/2018    Diabetes She presents for her follow-up diabetic visit. She has type 2 diabetes mellitus. Onset time: She was diagnosed at approximate age of 50 years. Her disease course has been worsening. There are no hypoglycemic associated symptoms. Pertinent negatives for hypoglycemia include no confusion, headaches, pallor, seizures or tremors. Associated symptoms include fatigue, polydipsia, polyuria and weight loss. Pertinent negatives for diabetes include no chest pain and no polyphagia. (Vaginal yeast infection) There are no hypoglycemic complications. Symptoms are worsening. Diabetic complications include heart disease. Risk factors for coronary artery disease include diabetes mellitus, dyslipidemia, hypertension, family history, sedentary lifestyle and stress. Current diabetic treatment  includes insulin injections and oral agent (dual therapy). She is compliant with treatment some of the time (says she forgets to take her insulin from time to time). Her weight is decreasing rapidly. She is following a generally healthy diet. When asked about meal planning, she reported none. She has had a previous visit with a dietitian. She never participates in exercise. Her home blood glucose trend is increasing steadily. Her overall blood glucose range is >200 mg/dl. (She presents today with her meter, no logs, showing inconsistent monitoring pattern.  Her POCT A1c today is 12.5%, increasing from last visit of 9.3%.  She  reports a great amount of stress lately (was evicted from her house, she and her teenage daughter moved in her with her mom, been battling vaginal yeast infection, had money troubles, etc) which has impacted her diabetes management.  She did see her cardiologist recently who prescribed Farxiga for her but she hasn't started taking it yet, wanted to check with [place] first.) An ACE inhibitor/angiotensin II receptor blocker is being taken. She does not see a podiatrist.Eye exam is current.  Hyperlipidemia This is a chronic problem. The current episode started more than 1 year ago. The problem is uncontrolled. Recent lipid tests were reviewed and are high. Exacerbating diseases include diabetes. Factors aggravating her hyperlipidemia include beta blockers and fatty foods. Pertinent negatives include no chest pain, myalgias or shortness of breath. Current antihyperlipidemic treatment includes statins. The current treatment provides no improvement of lipids. Compliance problems include adherence to diet and adherence to exercise.  Risk factors for coronary artery disease include dyslipidemia, diabetes mellitus, hypertension, a sedentary lifestyle and family history.  Hypertension This is a chronic problem. The current episode started more than 1 year ago. The problem has been gradually improving  since onset. The problem is controlled. Pertinent negatives include no chest pain, headaches, palpitations or shortness of breath. There are no associated agents to hypertension. Risk factors for coronary artery disease include diabetes mellitus, dyslipidemia, family history and sedentary lifestyle. Past treatments include beta blockers, diuretics and angiotensin blockers. The current treatment provides significant improvement. There are no compliance problems.  Hypertensive end-organ damage includes CAD/MI and heart failure.    Review of systems  Constitutional: + rapidly decreasing body weight-unintentional,  current Body mass index is 29.01 kg/m. , no fatigue, no subjective hyperthermia, no subjective hypothermia Eyes: no blurry vision, no xerophthalmia ENT: no sore throat, no nodules palpated in throat, no dysphagia/odynophagia, no hoarseness Cardiovascular: no chest pain, some intermittent SOB r/t CHF, no palpitations, no leg swelling Respiratory: no cough, some intermittent shortness of breath r/t CHF Gastrointestinal: no nausea/vomiting/diarrhea Musculoskeletal: no muscle/joint aches Skin: no rashes, no hyperemia,  Neurological: no tremors, no numbness, no tingling, no dizziness Psychiatric: no depression, + intermittent anxiety    Objective:    BP 123/81   Pulse 88   Ht 5' 7.5" (1.715 m)   Wt 188 lb (85.3 kg)   BMI 29.01 kg/m   Wt Readings from Last 3 Encounters:  12/12/20 188 lb (85.3 kg)  08/16/20 199 lb (90.3 kg)  08/14/20 202 lb 12.8 oz (92 kg)    BP Readings from Last 3 Encounters:  12/12/20 123/81  08/31/20 128/80  08/16/20 125/84      Physical Exam- Limited  Constitutional:  Body mass index is 29.01 kg/m. , not in acute distress, anxious state of mind Eyes:  EOMI, no exophthalmos Neck: Supple Cardiovascular: RRR, no murmurs, rubs, or gallops, no edema Respiratory: Adequate breathing efforts, no crackles, rales, rhonchi, or wheezing Musculoskeletal: no  gross deformities, strength intact in all four extremities, no gross restriction of joint movements Skin:  no rashes, no hyperemia Neurological: no tremor with outstretched hands    CMP ( most recent) CMP     Component Value Date/Time   NA 131 (L) 11/10/2020 1006   NA 138 04/03/2018 1240   K 3.8 11/10/2020 1006   CL 100 11/10/2020 1006   CO2 21 (L) 11/10/2020 1006   GLUCOSE 387 (H) 11/10/2020 1006   BUN 15 11/10/2020 1006   BUN 20 04/03/2018 1240   CREATININE 0.73 11/10/2020 1006  CREATININE 0.74 04/06/2014 0934   CALCIUM 8.2 (L) 11/10/2020 1006   PROT 7.5 11/10/2020 1006   PROT 6.5 03/26/2017 0916   ALBUMIN 4.0 11/10/2020 1006   ALBUMIN 4.0 03/26/2017 0916   AST 14 (L) 11/10/2020 1006   ALT 18 11/10/2020 1006   ALKPHOS 73 11/10/2020 1006   BILITOT 0.8 11/10/2020 1006   BILITOT 0.4 03/26/2017 0916   GFRNONAA >60 11/10/2020 1006   GFRAA >60 01/06/2020 1053     Diabetic Labs (most recent): Lab Results  Component Value Date   HGBA1C 12.5 (A) 12/12/2020   HGBA1C 9.3 (A) 08/16/2020   HGBA1C 8.5 (H) 05/12/2020     Lipid Panel ( most recent) Lipid Panel     Component Value Date/Time   CHOL 219 (H) 11/10/2020 1006   CHOL 202 (H) 03/26/2017 0916   TRIG 208 (H) 11/10/2020 1006   HDL 32 (L) 11/10/2020 1006   HDL 45 03/26/2017 0916   CHOLHDL 6.8 11/10/2020 1006   VLDL 42 (H) 11/10/2020 1006   LDLCALC 145 (H) 11/10/2020 1006   LDLCALC 134 (H) 03/26/2017 0916      Assessment & Plan:   1) Uncontrolled type 2 diabetes mellitus with hyperglycemia ([place])  - [name] has currently uncontrolled symptomatic type 2 DM since 50 years of age.  She presents today with her meter, no logs, showing inconsistent monitoring pattern.  Her POCT A1c today is 12.5%, increasing from last visit of 9.3%.  She reports a great amount of stress lately (was evicted from her house, she and her teenage daughter moved in her with her mom, been battling vaginal yeast infection, had money  troubles, etc) which has impacted her diabetes management.  She did see her cardiologist recently who prescribed Farxiga for her but she hasn't started taking it yet, wanted to check with [place] first.  her diabetes is complicated by coronary artery disease/CHF and she remains at a high risk for more acute and chronic complications which include CAD, CVA, CKD, retinopathy, and neuropathy. These are all discussed in detail with her.  - Nutritional counseling repeated at each appointment due to patients tendency to fall back in to old habits.  - The patient admits there is a room for improvement in their diet and drink choices. -  Suggestion is made for the patient to avoid simple carbohydrates from their diet including Cakes, Sweet Desserts / Pastries, Ice Cream, Soda (diet and regular), Sweet Tea, Candies, Chips, Cookies, Sweet Pastries, Store Bought Juices, Alcohol in Excess of 1-2 drinks a day, Artificial Sweeteners, Coffee Creamer, and "Sugar-free" Products. This will help patient to have stable blood glucose profile and potentially avoid unintended weight gain.   - I encouraged the patient to switch to unprocessed or minimally processed complex starch and increased protein intake (animal or plant source), fruits, and vegetables.   - Patient is advised to stick to a routine mealtimes to eat 3 meals a day and avoid unnecessary snacks (to snack only to correct hypoglycemia).  - I have approached her with the following individualized plan to manage diabetes and patient agrees:   -She is advised to be more consistent with taking her insulin.    -She is advised to continue current regimen of Novolog 70/30 75 units with breakfast and 90 units with supper if glucose is above 90 and she is eatiing.  She can also continue Metformin 1000 mg po twice daily with meals and Glipizide 5 mg po twice daily with meals.  She is  advised to start the [place] as prescribed by her cardiologist which will help her diabetes  management and also her CHF.  She is aware that this medication can increase her risk of vaginal yeast infections and UTIs and that she will have to contact her cardiologist for refills.    -She is urged to continue monitoring blood glucose at least three times per day, before injecting insulin (at breakfast and supper) and before bed.  She is encouraged to call the clinic if she has readings less than 70 or greater than 300 for 3 tests in a row.  - Patient specific target  A1c; LDL, HDL, Triglycerides, were discussed in detail.  2) Blood Pressure /Hypertension: Her blood pressure is controlled to target.  She is advised to continue Lasix 20 mg po daily, Metoprolol 100 mg po daily, Spironolactone 25 mg daily, and Sacubitril-Valsartan 97-103 mg po twice daily.  She sees Dr. Haroldine Laws for CHF.  3) Lipids/Hyperlipidemia:  Her most recent lipid panel from 11/10/20 shows uncontrolled LDL of 145 and elevated triglycerides of 208.  She is advised to continue Lipitor 10 mg po daily at bedtime.  Side effects and precautions discussed with her.  4)  Weight/Diet:  Her Body mass index is 29.01 kg/m.-she is a candidate for mild weight loss.  I discussed with her the fact that loss of 5 - 10% of her  current body weight will have the most impact on her diabetes management.  CDE Consult will be initiated . Exercise, and detailed carbohydrates information provided  -  detailed on discharge instructions.  5) Chronic Care/Health Maintenance: -she is on ACEI/ARB medications and statin medications and is encouraged to initiate and continue to follow up with Ophthalmology, Dentist,  Podiatrist at least yearly or according to recommendations, and advised to  stay away from smoking. I have recommended yearly flu vaccine and pneumonia vaccine at least every 5 years; moderate intensity exercise for up to 150 minutes weekly; and  sleep for at least 7 hours a day.  - she is  advised to maintain close follow up with Erven Colla, DO for primary care needs, as well as her other providers for optimal and coordinated care.       I spent 40 minutes in the care of the patient today including review of labs from [facility], Lipids, Thyroid Function, Hematology (current and previous including abstractions from other facilities); face-to-face time discussing  her blood glucose readings/logs, discussing hypoglycemia and hyperglycemia episodes and symptoms, medications doses, her options of short and long term treatment based on the latest standards of care / guidelines;  discussion about incorporating lifestyle medicine;  and documenting the encounter.    Please refer to Patient Instructions for Blood Glucose Monitoring and Insulin/Medications Dosing Guide"  in media tab for additional information. Please  also refer to " Patient Self Inventory" in the Media  tab for reviewed elements of pertinent patient history.  [name] participated in the discussions, expressed understanding, and voiced agreement with the above plans.  All questions were answered to her satisfaction. she is encouraged to contact clinic should she have any questions or concerns prior to her return visit.    Follow up plan: - Return in about 3 months (around 03/13/2021) for Diabetes F/U with A1c in office, No previsit labs, Bring meter and logs; bring logs by in 2 weeks.  Rayetta Pigg, [facility] [address] [place], [place] [address] Phone: [phone] Fax: [phone]  12/12/2020, 12:16  PM

## 2020-12-12 NOTE — Patient Instructions (Signed)

## 2020-12-13 ENCOUNTER — Other Ambulatory Visit: Payer: Self-pay | Admitting: Family Medicine

## 2020-12-13 DIAGNOSIS — Z1231 Encounter for screening mammogram for malignant neoplasm of breast: Secondary | ICD-10-CM

## 2020-12-18 ENCOUNTER — Ambulatory Visit (INDEPENDENT_AMBULATORY_CARE_PROVIDER_SITE_OTHER): Payer: 59

## 2020-12-18 DIAGNOSIS — I5022 Chronic systolic (congestive) heart failure: Secondary | ICD-10-CM

## 2020-12-18 DIAGNOSIS — Z95810 Presence of automatic (implantable) cardiac defibrillator: Secondary | ICD-10-CM

## 2020-12-19 ENCOUNTER — Telehealth: Payer: Self-pay | Admitting: Nurse Practitioner

## 2020-12-19 NOTE — Telephone Encounter (Signed)
Patient was seen on 12/12/2020 and her Hemoglobin A1c was elevated. Please advise.

## 2020-12-19 NOTE — Telephone Encounter (Signed)
Pt states since the weekend she has been noticing blurred vision at times. As in when she is texting or reading song book at church. She wants to know if this could be coming from her sugar.

## 2020-12-19 NOTE — Telephone Encounter (Signed)
Absolutely, high glucose readings can cause blurred vision!  This should be temporary and correct itself once we gain better control of diabetes.

## 2020-12-19 NOTE — Progress Notes (Signed)
EPIC Encounter for ICM Monitoring  Patient Name: Tara Bryant is a 50 y.o. female Date: 12/19/2020 Primary Care Physican: Erven Colla, DO Primary Cardiologist: [place] Electrophysiologist: Lovena Le 08/16/2020 Weight: 199 lbs                                Attempted call to patient and unable to reach.  Left message to return call. Transmission reviewed.   HeartLogic Heart Failure Index 3 suggesting normal fluid levels.  Thoracic impedance is trending down.    Prescribed: Furosemide 40 mg Take 60 mg by mouth daily. Can Do additional 20 mg for SOB Spironolactone 25 mg take 0.5 tablet (12.5 mg total) by mouth daily.   Recommendations:  Unable to reach.     Follow-up plan: ICM clinic phone appointment on 01/22/2021.   91 day device clinic remote transmission 02/02/2021.              EP/Cardiology next office visit: 10/03/2020 with [facility] was canceled and needs to reschedule.          Copy of ICM check sent to Dr. Lovena Le.  3 Month Trend    8 Day Data Trend          Rosalene Billings, RN 12/19/2020 3:15 PM

## 2020-12-20 NOTE — Telephone Encounter (Signed)
Called patient and gave her the message. I advised for patient to reach out to her eye doctor also as she stated that she has not seen them in over 2 years. Patient also stated that she has had 2 low BG readings in the 50's and I advised her to check her blood sugar before bedtime to help prevent the low readings during the night. Patient verbalized an understanding.

## 2020-12-20 NOTE — Progress Notes (Signed)
Spoke with patient and heart failure questions reviewed.  Pt asymptomatic for fluid accumulation and feeling well.  Last office weight is 188 lbs and she does not have a scale at home.  No changes and encouraged to call if experiencing any fluid symptoms.

## 2020-12-28 ENCOUNTER — Other Ambulatory Visit: Payer: Self-pay | Admitting: "Endocrinology

## 2021-01-18 ENCOUNTER — Ambulatory Visit: Payer: 59

## 2021-01-22 ENCOUNTER — Telehealth: Payer: Self-pay

## 2021-01-22 ENCOUNTER — Ambulatory Visit (INDEPENDENT_AMBULATORY_CARE_PROVIDER_SITE_OTHER): Payer: 59

## 2021-01-22 DIAGNOSIS — Z95810 Presence of automatic (implantable) cardiac defibrillator: Secondary | ICD-10-CM

## 2021-01-22 DIAGNOSIS — I5022 Chronic systolic (congestive) heart failure: Secondary | ICD-10-CM | POA: Diagnosis not present

## 2021-01-22 NOTE — Progress Notes (Signed)
EPIC Encounter for ICM Monitoring  Patient Name: Tara Bryant is a 50 y.o. female Date: 01/22/2021 Primary Care Physican: Erven Colla, DO Primary Cardiologist: [place] Electrophysiologist: Lovena Le 01/23/2021 Office Weight: 188 lbs                                Attempted call to patient and unable to reach.  Left message to return call. Transmission reviewed.   HeartLogic Heart Failure Index 48 suggesting possible ongoing fluid accumulation since 9/15 with highest index of 50 on 10/8.     Prescribed: Furosemide 40 mg Take 60 mg by mouth daily. Can Do additional 20 mg for SOB Spironolactone 25 mg take 0.5 tablet (12.5 mg total) by mouth daily.   Recommendations:  Unable to reach.  Will to take additional Furosemide as prescribed if patient is reached.    Follow-up plan: ICM clinic phone appointment on 01/29/2021 to recheck fluid levels.   91 day device clinic remote transmission 02/02/2021.              EP/Cardiology next office visit:  02/12/2021 with Dr Harl Bowie.   10/03/2020 with [facility] was canceled and needs to reschedule.            Copy of ICM check sent to Dr. Lovena Le.  Will send to Dr Haroldine Laws for review if patient is reached.  3 Month Trend    8 Day Data Trend          Rosalene Billings, RN 01/22/2021 2:06 PM

## 2021-01-22 NOTE — Telephone Encounter (Signed)
Remote ICM transmission received.  Attempted call to patient regarding ICM remote transmission and left message to return call   

## 2021-01-23 NOTE — Progress Notes (Signed)
Spoke with patient and heart failure questions reviewed.  Pt asymptomatic for fluid accumulation and feeling fine.   Her mother, who she lives with does cook but is probably foods high in salt.  Discussed limiting salt intake to 2000 mg daily.   She reports missing Furosemide dosage about 2 - 3 days a week.   Currently prescribed:  Furosemide 40 mg Take 1.5 tablets (60 mg total) by mouth daily. Can Do additional 20 mg for SOB  Recommended to take: Furosemide 40 mg 2 tablets (80 mg total) for next 3 days only.  After 3rd day return to 60 mg daily.  Encouraged not to skip or miss medication dosages.   Copy sent to Dr Vaughan Browner for review and further recommendations if needed.

## 2021-01-25 ENCOUNTER — Telehealth: Payer: Self-pay | Admitting: Family Medicine

## 2021-01-25 DIAGNOSIS — Z1211 Encounter for screening for malignant neoplasm of colon: Secondary | ICD-10-CM

## 2021-01-25 NOTE — Telephone Encounter (Signed)
Patient is wanting to setup colonoscopy to be done will she need to be seen first by provider.

## 2021-01-26 NOTE — Telephone Encounter (Signed)
Referral ordered in Epic. Left message to return call to notify patient. 

## 2021-01-26 NOTE — Telephone Encounter (Signed)
Nilda Simmer, NP    No visit necessary if this is for routine screening colonoscopy. We just need to put in referral. Thanks.

## 2021-01-26 NOTE — Progress Notes (Signed)
Returned patient call per voice mail request.  She said she was unsure how to take the Furosemide after we spoke and googled online how to take the medication.  She followed the advise of a random person online and took Furosemide 40 mg bid instead of taking 80 mg daily x 3 days as instructed at [facility] call.  She took extra for 2 days.  Advised to call back if there is any confusion regarding her medication since taking the advice of random stranger online can be very dangerous.  Advised to take the Furosemide as prescribed 1.5 tablets = 60mg  daily. She verbalized understanding.

## 2021-01-29 ENCOUNTER — Ambulatory Visit (INDEPENDENT_AMBULATORY_CARE_PROVIDER_SITE_OTHER): Payer: 59

## 2021-01-29 DIAGNOSIS — Z95810 Presence of automatic (implantable) cardiac defibrillator: Secondary | ICD-10-CM

## 2021-01-29 DIAGNOSIS — I5022 Chronic systolic (congestive) heart failure: Secondary | ICD-10-CM

## 2021-01-30 ENCOUNTER — Telehealth: Payer: Self-pay

## 2021-01-30 NOTE — Progress Notes (Signed)
EPIC Encounter for ICM Monitoring  Patient Name: Tara Bryant is a 50 y.o. female Date: 01/30/2021 Primary Care Physican: Coral Spikes, DO Primary Cardiologist: [place] Electrophysiologist: Lovena Le 01/23/2021 Office Weight: 188 lbs                                Attempted call to patient and unable to reach.  Left message to return call. Transmission reviewed.   HeartLogic Heart Failure Index decreased to 20 (was 48) suggesting fluid levels returning to baseline normal after taking Furosemide 80 mg daily.     Prescribed: Furosemide 40 mg Take 60 mg by mouth daily. Can Do additional 20 mg for SOB Spironolactone 25 mg take 0.5 tablet (12.5 mg total) by mouth daily.   Recommendations:  Unable to reach.     Follow-up plan: ICM clinic phone appointment on 02/06/2021 to recheck fluid levels.   91 day device clinic remote transmission 02/02/2021.              EP/Cardiology next office visit:  02/12/2021 with Dr Harl Bowie.   10/03/2020 with [facility] was canceled and needs to reschedule.            Copy of ICM check sent to Dr. Lovena Le.    3 Month Trend    8 Day Data Trend          Rosalene Billings, RN 01/30/2021 12:11 PM

## 2021-01-30 NOTE — Telephone Encounter (Signed)
Remote ICM transmission received.  Attempted call to patient regarding ICM remote transmission and left message to return call   

## 2021-01-31 NOTE — Progress Notes (Signed)
Spoke with patient and heart failure questions reviewed.  Pt asymptomatic for fluid accumulation and feeling well.  Transmission reviewed and advised of fluid level improvement.  Encouraged to take Furosemide as prescribed to help fluid levels return to normal and avoid foods that are salty.  Recheck fluid levels 11/1.

## 2021-02-02 ENCOUNTER — Ambulatory Visit (INDEPENDENT_AMBULATORY_CARE_PROVIDER_SITE_OTHER): Payer: 59

## 2021-02-02 DIAGNOSIS — I428 Other cardiomyopathies: Secondary | ICD-10-CM | POA: Diagnosis not present

## 2021-02-02 LAB — CUP PACEART REMOTE DEVICE CHECK
Battery Remaining Longevity: 156 mo
Battery Remaining Percentage: 100 %
Brady Statistic RV Percent Paced: 0 %
Date Time Interrogation Session: 20221028044100
HighPow Impedance: 81 Ohm
Implantable Lead Implant Date: 20200723
Implantable Lead Location: 753860
Implantable Lead Model: 293
Implantable Lead Serial Number: 443823
Implantable Pulse Generator Implant Date: 20200723
Lead Channel Impedance Value: 500 Ohm
Lead Channel Setting Pacing Amplitude: 3.5 V
Lead Channel Setting Pacing Pulse Width: 0.4 ms
Lead Channel Setting Sensing Sensitivity: 0.5 mV
Pulse Gen Serial Number: 256025

## 2021-02-06 ENCOUNTER — Ambulatory Visit (INDEPENDENT_AMBULATORY_CARE_PROVIDER_SITE_OTHER): Payer: 59

## 2021-02-06 DIAGNOSIS — Z95810 Presence of automatic (implantable) cardiac defibrillator: Secondary | ICD-10-CM

## 2021-02-06 DIAGNOSIS — I5022 Chronic systolic (congestive) heart failure: Secondary | ICD-10-CM

## 2021-02-06 NOTE — Progress Notes (Signed)
EPIC Encounter for ICM Monitoring  Patient Name: Tara Bryant is a 50 y.o. female Date: 02/06/2021 Primary Care Physican: Coral Spikes, DO Primary Cardiologist: Bensimhon Electrophysiologist: Lovena Le 01/23/2021 Office Weight: 188 lbs                                Spoke with patient and heart failure questions reviewed.  Pt asymptomatic for fluid accumulation and feeling well.   [employer] Heart Failure Index decreased to 14 suggesting fluid levels returned to normal.     Prescribed: Furosemide 40 mg Take 60 mg by mouth daily. Can Do additional 20 mg for SOB Spironolactone 25 mg take 0.5 tablet (12.5 mg total) by mouth daily.   Recommendations:  No changes and encouraged to call if experiencing any fluid symptoms.   Follow-up plan: ICM clinic phone appointment on 03/12/2021.   91 day device clinic remote transmission 02/02/2021.              EP/Cardiology next office visit:  02/12/2021 with Dr Harl Bowie.   10/03/2020 with [facility] PA was canceled and needs to reschedule.            Copy of ICM check sent to Dr. Lovena Le.    3 Month Trend    8 Day Data Trend          Rosalene Billings, RN 02/06/2021 8:47 AM

## 2021-02-08 NOTE — Telephone Encounter (Signed)
Patient notified

## 2021-02-12 ENCOUNTER — Encounter: Payer: Self-pay | Admitting: Cardiology

## 2021-02-12 ENCOUNTER — Ambulatory Visit (INDEPENDENT_AMBULATORY_CARE_PROVIDER_SITE_OTHER): Payer: 59 | Admitting: Cardiology

## 2021-02-12 VITALS — BP 124/76 | HR 94 | Ht 67.5 in | Wt 195.4 lb

## 2021-02-12 DIAGNOSIS — I2510 Atherosclerotic heart disease of native coronary artery without angina pectoris: Secondary | ICD-10-CM | POA: Diagnosis not present

## 2021-02-12 DIAGNOSIS — I5022 Chronic systolic (congestive) heart failure: Secondary | ICD-10-CM

## 2021-02-12 DIAGNOSIS — G4733 Obstructive sleep apnea (adult) (pediatric): Secondary | ICD-10-CM

## 2021-02-12 NOTE — Patient Instructions (Signed)

## 2021-02-12 NOTE — Progress Notes (Signed)
Remote ICD transmission.   

## 2021-02-12 NOTE — Progress Notes (Signed)
Clinical Summary Tara Bryant is a 50 y.o.femaleseen today for follow up of the following medical problems.      1. 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    - 08/2018 echo: LVEF 25-30%, grade II diastolic dysfunction, mild RV dysfunction - 10/29/18 ICD implant [employer]  - followed by CHF clinic, has had close f/u with CHF pharmacy clinic as well. CPX 05/2020   - no recent SOB/DOE. Occasional LE edema at times. Sometimes misses doses of lasix due to schedule - has not started corlanor yet but has at home - from report her endocrinologist has not been in favor of SGLT2is for her    11/2019 echo LVEF 25-30%  - no SOB/DOE. Mixed compliance with meds. Has not started farxiga, has had some concerns about increased risk for yeast infections.      2. Pulmonary hypertension - type II, her mean PA was 45 and PCWP 41 in setting of severel volume overload. Moderate RV dysfunction by MRI   3. CAD - moderate nonobstructive disease by cath    - no recent chest pains.      4. Sinus tachcyardia -  She has had prior visits with sinus tach of unclear etiology.       5. 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 Daughter is 71 graduating high  school this year, interested in CNA program and ultimately nursing.    Past Medical History:  Diagnosis Date   AICD (automatic cardioverter/defibrillator) present    Arrhythmia    Arthritis    CAD (coronary artery disease)    a. 03/2018: cath showing 60% mid-LAD stenosis not significant by FFR.    Chronic combined systolic (congestive) and diastolic (congestive) heart failure ([place])    a. 03/2018: found to have a newly reduced EF of 20% by echocardiogram   Depression    denies   Diabetes mellitus    GERD (gastroesophageal reflux disease)    h/o of   Hypertension    Microproteinuria    S/P thyroid biopsy 2020   follow up once a year,    Sleep apnea      Allergies  Allergen Reactions   Aspartame And Phenylalanine Dermatitis     Current Outpatient Medications  Medication Sig Dispense Refill   apixaban (ELIQUIS) 5 MG TABS tablet Take 1 tablet (5 mg total) by mouth 2 (two) times daily. 180 tablet 3   atorvastatin (LIPITOR) 10 MG tablet Take 1 tablet (10 mg total) by mouth daily. 90 tablet 1   B-D ULTRAFINE III SHORT PEN 31G X 8 MM MISC USE TO INJECT INSULIN PEN TWICE DAILY 100 each 3   benzonatate (TESSALON) 100 MG capsule Take  1 capsule (100 mg total) by mouth 2 (two) times daily as needed for cough. 20 capsule 0   dapagliflozin propanediol (FARXIGA) 10 MG TABS tablet Take 1 tablet (10 mg total) by mouth daily. (Patient not taking: Reported on 12/12/2020) 90 tablet 1   ENTRESTO 97-103 MG TAKE 1 TABLET BY MOUTH TWICE DAILY 60 tablet 5   furosemide (LASIX) 40 MG tablet Take 60 mg by mouth daily. Can Do additional 20 mg for SOB     glipiZIDE (GLUCOTROL) 5 MG tablet TAKE 1 TABLET(5 MG) BY MOUTH TWICE DAILY BEFORE A MEAL 180 tablet 0   glucose blood (FREESTYLE LITE) test strip Use as instructed 100 each 12   ivabradine (CORLANOR) 5 MG TABS tablet Take 1 tablet (5 mg total) by mouth 2 (two) times daily with a meal. 180 tablet 5   Lancets (ONETOUCH DELICA PLUS [id number]) MISC USE TO TEST  BLOOD SUGAR FOUR TIMES DAILY     metFORMIN (GLUCOPHAGE) 500 MG tablet Take 2 tablets (1,000 mg total) by mouth 2 (two) times daily with a meal. 360 tablet 3   metoprolol succinate (TOPROL-XL) 200 MG 24 hr tablet Take 1 tablet (200 mg total) by mouth daily. 90 tablet 3   NORLYDA 0.35 MG tablet Take 1 tablet by mouth at bedtime.   12   NOVOLIN 70/30 KWIKPEN (70-30) 100 UNIT/ML KwikPen INJECT 75 UNITS INTO THE SKIN AT BREAKFAST AND 90 UNITS AT DINNER WHEN GLUCOSE IS ABOVE 90 60 mL 1   spironolactone (ALDACTONE) 25 MG tablet Take 12.5 mg by mouth daily.     No current facility-administered medications for this visit.     Past Surgical History:  Procedure Laterality Date   ANTERIOR CERVICAL DECOMP/DISCECTOMY FUSION N/A 12/03/2019   Procedure: ANTERIOR CERVICAL DECOMPRESSION/DISCECTOMY FUSION CERVICAL SIX- CERVICAL SEVEN;  Surgeon: Consuella Lose, MD;  Location: [place];  Service: Neurosurgery;  Laterality: N/A;  ANTERIOR CERVICAL DECOMPRESSION/DISCECTOMY FUSION CERVICAL SIX- CERVICAL SEVEN   BACK SURGERY  2010   BREAST BIOPSY Left 2017   benign   CARDIAC CATHETERIZATION     ICD IMPLANT N/A 10/29/2018   Procedure: ICD IMPLANT;  Surgeon: Evans Lance, MD;  Location: [place] [facility];  Service: Cardiovascular;  Laterality: N/A;   INTRAVASCULAR PRESSURE WIRE/FFR STUDY N/A 03/24/2018   Procedure: INTRAVASCULAR PRESSURE WIRE/FFR STUDY;  Surgeon: Leonie Man, MD;  Location: [place] [facility];  Service: Cardiovascular;  Laterality: N/A;   NOSE SURGERY     RIGHT/LEFT HEART CATH AND CORONARY ANGIOGRAPHY N/A 03/24/2018   Procedure: RIGHT/LEFT HEART CATH AND CORONARY ANGIOGRAPHY;  Surgeon: Leonie Man, MD;  Location: [place] [facility];  Service: Cardiovascular;  Laterality: N/A;   TONSILLECTOMY     WISDOM TOOTH EXTRACTION       Allergies  Allergen Reactions   Aspartame And Phenylalanine Dermatitis      Family History  Problem Relation Age of Onset   Stroke Sister 94        smoker, diabetic, HTN   Breast cancer Sister    Cancer Mother        breast   Breast cancer Mother    Hypertension Mother    Diabetes Other    Heart attack Other    Chronic Renal Failure Father      Social History Ms. Macapagal reports that she has never smoked. She has never used smokeless tobacco. Ms. Fischler reports no history of alcohol use.   Review of Systems CONSTITUTIONAL: No weight loss, fever, chills, weakness  or fatigue.  HEENT: Eyes: No visual loss, blurred vision, double vision or yellow sclerae.No hearing loss, sneezing, congestion, runny nose or sore throat.  SKIN: No rash or itching.  CARDIOVASCULAR: per hpi RESPIRATORY: No shortness of breath, cough or sputum.  GASTROINTESTINAL: No anorexia, nausea, vomiting or diarrhea. No abdominal pain or blood.  GENITOURINARY: No burning on urination, no polyuria NEUROLOGICAL: No headache, dizziness, syncope, paralysis, ataxia, numbness or tingling in the extremities. No change in bowel or bladder control.  MUSCULOSKELETAL: No muscle, back pain, joint pain or stiffness.  LYMPHATICS: No enlarged nodes. No history of splenectomy.  PSYCHIATRIC: No history of depression or anxiety.  ENDOCRINOLOGIC: No reports of sweating, cold or heat intolerance. No polyuria or polydipsia.  Marland Kitchen   Physical Examination Today's Vitals   02/12/21 0836  BP: 124/76  Pulse: 94  SpO2: 98%  Weight: 195 lb 6.4 oz (88.6 kg)  Height: 5' 7.5" (1.715 m)   Body mass index is 30.15 kg/m.   Gen: resting comfortably, no acute distress HEENT: no scleral icterus, pupils equal round and reactive, no palptable cervical adenopathy,  CV: RRR, no mr/g no jvd Resp: Clear to auscultation bilaterally GI: abdomen is soft, non-tender, non-distended, normal bowel sounds, no hepatosplenomegaly MSK: extremities are warm, no edema.  Skin: warm, no rash Neuro:  no focal deficits Psych: appropriate affect   Diagnostic Studies  03/2018 echo LVEF 20%, diffuse  hypokinesis, cannot eval diastolic function, mild RV dysfunction, PASP 47, small pericardial effusion   03/2018 LHC/RHC Prox LAD lesion is 55% stenosed. DFR 0.92 Otherwise angiographically normal coronary arteries. Hemodynamic findings consistent with severe pulmonary hypertension. LV end diastolic pressure is severely elevated.   SUMMARY: Moderate single-vessel disease with roughly 60% mid LAD (DFR 0.92 -not physiologically significant) Otherwise minimal CAD. 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  (Output 4.48, Index 2.2.)     03/2018 cardiac MRI FINDINGS: 1. Moderately dilated left ventricle with mild concentric hypertrophy and severe systolic function (LVEF = 27%). Severe diffuse hypokinesis and paradoxical septal motion. There is no late gadolinium enhancement in the left ventricular myocardium. There is significant hyper trabeculation of the left ventricle with ratio of non-compacted to compacted myocardium > 4:1.   LVEDD: 61 mm   LVESD: 51 mm   LVEDV: 170 ml   LVESV: 124 ml   SV: 46 ml   CO: 4.6 L/min   Myocardial mass: 166 g   2. Normal right ventricular size, thickness and moderate systolic dysfunction (LVEF = 30%). There are no regional wall motion abnormalities.   3.  Normal left and right atrial size.   4. Normal size of the aortic root, ascending aorta and pulmonary artery.   5.  Trivial mitral and mild tricuspid regurgitation.   6. Normal pericardium. Mild circumferential pericardial effusion, moderate amount inferior to the heart (up to 11 mm).   IMPRESSION: 1. Moderately dilated left ventricle with mild concentric hypertrophy and severe systolic function (LVEF = 27%). Severe diffuse hypokinesis and paradoxical septal motion. There is no late gadolinium enhancement in the  left ventricular myocardium. There is significant hyper trabeculation of the left ventricle with ratio of non-compacted to compacted myocardium > 4:1.   2. Normal right ventricular size, thickness and moderate systolic dysfunction (LVEF = 30%). There are no regional wall motion abnormalities.   3.  Normal left and right atrial size.   4. Normal size of  the aortic root, ascending aorta and pulmonary artery.   5.  Trivial mitral and mild tricuspid regurgitation.   6. Normal pericardium. Mild circumferential pericardial effusion, moderate amount inferior to the heart (up to 11 mm).   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     08/2018 echo IMPRESSIONS      1. The left ventricle has severely reduced systolic function, with an ejection fraction of 25-30%. The cavity size was normal. There is mild concentric left ventricular hypertrophy. Left ventricular diastolic Doppler parameters are consistent with  pseudonormalization. Elevated left ventricular end-diastolic pressure Left ventricular diffuse hypokinesis.  2. The left ventricular apex appears trabeculated.  3. The right ventricle has mildly reduced systolic function. The cavity was normal. There is mildly increased right ventricular wall thickness.  4. The mitral valve is grossly normal. Mild thickening of the mitral valve leaflet. Mitral valve regurgitation is mild to moderate by color flow Doppler.  5. The tricuspid valve is grossly normal.  6. The aortic valve is grossly normal. Mild thickening of the aortic valve.  7. The aortic root is normal in size and structure.     01/2019 IMPRESSIONS 1.  Moderate obstructive sleep apnea syndrome worse during REM sleep is noted with this recording.  AutoPap 5-12 as recommended.   Assessment and Plan  1. Chronic systolic HF/Noncompaction cardiomyopathy - NICM, noncompaction CM by MRI - has been on anticoag in  setting of noncompaction and low LVEF. She has an ICD - denies any symptosm, appears euvolemic - encouraged her to start farxiga that has been previously prescribed.    2. CAD -no symptoms, continue current meds   3. OSA - encouraged to reach out to Dr Bari Mantis office to obtain her cpap.      Arnoldo Lenis, M.D

## 2021-02-14 ENCOUNTER — Encounter: Payer: Self-pay | Admitting: Gastroenterology

## 2021-02-16 ENCOUNTER — Other Ambulatory Visit: Payer: Self-pay

## 2021-02-16 ENCOUNTER — Other Ambulatory Visit (INDEPENDENT_AMBULATORY_CARE_PROVIDER_SITE_OTHER): Payer: 59 | Admitting: *Deleted

## 2021-02-16 DIAGNOSIS — Z23 Encounter for immunization: Secondary | ICD-10-CM | POA: Diagnosis not present

## 2021-02-20 LAB — HM DIABETES EYE EXAM

## 2021-02-21 ENCOUNTER — Ambulatory Visit
Admission: RE | Admit: 2021-02-21 | Discharge: 2021-02-21 | Disposition: A | Discharge status: Home / Self Care | Payer: 59 | Source: Ambulatory Visit | Attending: Family Medicine | Admitting: Family Medicine

## 2021-02-21 ENCOUNTER — Other Ambulatory Visit: Payer: Self-pay

## 2021-02-21 DIAGNOSIS — Z1231 Encounter for screening mammogram for malignant neoplasm of breast: Secondary | ICD-10-CM

## 2021-03-07 ENCOUNTER — Ambulatory Visit (INDEPENDENT_AMBULATORY_CARE_PROVIDER_SITE_OTHER): Payer: 59 | Admitting: Gastroenterology

## 2021-03-07 ENCOUNTER — Encounter: Payer: Self-pay | Admitting: Gastroenterology

## 2021-03-07 VITALS — BP 124/70 | HR 106 | Ht 68.0 in | Wt 196.0 lb

## 2021-03-07 DIAGNOSIS — Z9229 Personal history of other drug therapy: Secondary | ICD-10-CM | POA: Diagnosis not present

## 2021-03-07 DIAGNOSIS — Z1212 Encounter for screening for malignant neoplasm of rectum: Secondary | ICD-10-CM

## 2021-03-07 DIAGNOSIS — Z1211 Encounter for screening for malignant neoplasm of colon: Secondary | ICD-10-CM

## 2021-03-07 DIAGNOSIS — I428 Other cardiomyopathies: Secondary | ICD-10-CM | POA: Diagnosis not present

## 2021-03-07 DIAGNOSIS — Z7901 Long term (current) use of anticoagulants: Secondary | ICD-10-CM

## 2021-03-07 NOTE — Patient Instructions (Signed)
If you are age 51 or older, your body mass index should be between 23-30. Your Body mass index is 29.8 kg/m. If this is out of the aforementioned range listed, please consider follow up with your Primary Care Provider.  If you are age 62 or younger, your body mass index should be between 19-25. Your Body mass index is 29.8 kg/m. If this is out of the aformentioned range listed, please consider follow up with your Primary Care Provider.   We will call to schedule your Colonoscopy at the hospital.  The [facility] providers would like to encourage you to use [facility] to communicate with providers for non-urgent requests or questions.  Due to long hold times on the telephone, sending your provider a message by [facility] may be a faster and more efficient way to get a response.  Please allow 48 business hours for a response.  Please remember that this is for non-urgent requests.   It was a pleasure to see you today!  Thank you for trusting me with your gastrointestinal care!    Scott E.Candis Schatz, MD

## 2021-03-07 NOTE — Progress Notes (Signed)
HPI : Tara Bryant is a very pleasant 50 year old female with a history of nonischemic cardiomyopathy status post AICD placement and on chronic anticoagulation for ventricular thrombus prophylaxis referred to [place] by Dr. Thersa Salt for colon cancer screening.  Patient reports regular bowel movements, usually 1 to 3/day.  She denies problems with abdominal pain, constipation or diarrhea.  She has bright red blood per rectum intermittently for many years.  The blood is sometimes on the stool as well as on the toilet paper.  The last time she saw any blood was probably about 4 months ago.  She denies other symptoms accompanying the hematochezia, such as pain with passage of stool or wiping or sensation of perianal swelling or prolapse.  She reports having GERD symptoms in the remote past and required medications, but she has been off medications for several years and any GERD symptoms. She denies any family history of GI malignancy. Her physical activity is limited by her severe cardiomyopathy, but she states that she would be able to go up 2 flights of stairs without stopping.  She has occasional swelling in her ankles, but denies orthopnea. She underwent cervical spine surgery last year, which was uncomplicated.  She held her Eliquis in the perioperative setting.   Past Medical History:  Diagnosis Date   AICD (automatic cardioverter/defibrillator) present    Arrhythmia    Arthritis    CAD (coronary artery disease)    a. 03/2018: cath showing 60% mid-LAD stenosis not significant by FFR.    Chronic combined systolic (congestive) and diastolic (congestive) heart failure ([place])    a. 03/2018: found to have a newly reduced EF of 20% by echocardiogram   Depression    denies   Diabetes mellitus    GERD (gastroesophageal reflux disease)    h/o of   Hypertension    Microproteinuria    S/P thyroid biopsy 2020   follow up once a year,    Sleep apnea      Past Surgical History:  Procedure  Laterality Date   ANTERIOR CERVICAL DECOMP/DISCECTOMY FUSION N/A 12/03/2019   Procedure: ANTERIOR CERVICAL DECOMPRESSION/DISCECTOMY FUSION CERVICAL SIX- CERVICAL SEVEN;  Surgeon: Consuella Lose, MD;  Location: [place];  Service: Neurosurgery;  Laterality: N/A;  ANTERIOR CERVICAL DECOMPRESSION/DISCECTOMY FUSION CERVICAL SIX- CERVICAL SEVEN   BACK SURGERY  2010   BREAST BIOPSY Left 2017   benign   CARDIAC CATHETERIZATION     ICD IMPLANT N/A 10/29/2018   Procedure: ICD IMPLANT;  Surgeon: Evans Lance, MD;  Location: [place] [facility];  Service: Cardiovascular;  Laterality: N/A;   INTRAVASCULAR PRESSURE WIRE/FFR STUDY N/A 03/24/2018   Procedure: INTRAVASCULAR PRESSURE WIRE/FFR STUDY;  Surgeon: Leonie Man, MD;  Location: [place] [facility];  Service: Cardiovascular;  Laterality: N/A;   NOSE SURGERY     RIGHT/LEFT HEART CATH AND CORONARY ANGIOGRAPHY N/A 03/24/2018   Procedure: RIGHT/LEFT HEART CATH AND CORONARY ANGIOGRAPHY;  Surgeon: Leonie Man, MD;  Location: [place] [facility];  Service: Cardiovascular;  Laterality: N/A;   TONSILLECTOMY     WISDOM TOOTH EXTRACTION     Family History  Problem Relation Age of Onset   Breast cancer Mother    Hypertension Mother    Chronic Renal Failure Father    Stroke Sister 48       smoker   Breast cancer Sister    Diabetes Sister    Hypertension Sister    Heart attack Paternal Grandmother    Diabetes Maternal Aunt  Colon cancer Neg Hx    Social History   Tobacco Use   Smoking status: Never   Smokeless tobacco: Never  Vaping Use   Vaping Use: Never used  Substance Use Topics   Alcohol use: No   Drug use: No   Current Outpatient Medications  Medication Sig Dispense Refill   apixaban (ELIQUIS) 5 MG TABS tablet Take 1 tablet (5 mg total) by mouth 2 (two) times daily. 180 tablet 3   atorvastatin (LIPITOR) 10 MG tablet Take 1 tablet (10 mg total) by mouth daily. 90 tablet 1   B-D ULTRAFINE III SHORT PEN 31G X 8 MM MISC USE TO  INJECT INSULIN PEN TWICE DAILY 100 each 3   benzonatate (TESSALON) 100 MG capsule Take 1 capsule (100 mg total) by mouth 2 (two) times daily as needed for cough. 20 capsule 0   ENTRESTO 97-103 MG TAKE 1 TABLET BY MOUTH TWICE DAILY 60 tablet 5   furosemide (LASIX) 40 MG tablet Take 60 mg by mouth daily. Can Do additional 20 mg for SOB     glipiZIDE (GLUCOTROL) 5 MG tablet TAKE 1 TABLET(5 MG) BY MOUTH TWICE DAILY BEFORE A MEAL 180 tablet 0   glucose blood (FREESTYLE LITE) test strip Use as instructed 100 each 12   ivabradine (CORLANOR) 5 MG TABS tablet Take 1 tablet (5 mg total) by mouth 2 (two) times daily with a meal. 180 tablet 5   Lancets (ONETOUCH DELICA PLUS [id number]) MISC USE TO TEST BLOOD SUGAR FOUR TIMES DAILY     metFORMIN (GLUCOPHAGE) 500 MG tablet Take 2 tablets (1,000 mg total) by mouth 2 (two) times daily with a meal. 360 tablet 3   metoprolol succinate (TOPROL-XL) 200 MG 24 hr tablet Take 1 tablet (200 mg total) by mouth daily. 90 tablet 3   NORLYDA 0.35 MG tablet Take 1 tablet by mouth at bedtime.   12   NOVOLIN 70/30 KWIKPEN (70-30) 100 UNIT/ML KwikPen INJECT 75 UNITS INTO THE SKIN AT BREAKFAST AND 90 UNITS AT DINNER WHEN GLUCOSE IS ABOVE 90 60 mL 1   spironolactone (ALDACTONE) 25 MG tablet Take 12.5 mg by mouth daily.     dapagliflozin propanediol (FARXIGA) 10 MG TABS tablet Take 1 tablet (10 mg total) by mouth daily. (Patient not taking: Reported on 03/07/2021) 90 tablet 1   No current facility-administered medications for this visit.   Allergies  Allergen Reactions   Aspartame And Phenylalanine Dermatitis     Review of Systems: All systems reviewed and negative except where noted in HPI.    MM 3D SCREEN BREAST BILATERAL  Result Date: 02/21/2021 CLINICAL DATA:  Screening. EXAM: DIGITAL SCREENING BILATERAL MAMMOGRAM WITH TOMOSYNTHESIS AND CAD TECHNIQUE: Bilateral screening digital craniocaudal and mediolateral oblique mammograms were obtained. Bilateral screening  digital breast tomosynthesis was performed. The images were evaluated with computer-aided detection. COMPARISON:  Previous exam(s). ACR Breast Density Category b: There are scattered areas of fibroglandular density. FINDINGS: There are no findings suspicious for malignancy. IMPRESSION: No mammographic evidence of malignancy. A result letter of this screening mammogram will be mailed directly to the patient. RECOMMENDATION: Screening mammogram in one year. (Code:SM-B-01Y) BI-RADS CATEGORY  1: Negative. Electronically Signed   By: Abelardo Diesel M.D.   On: 02/21/2021 14:04    Physical Exam: BP 124/70   Pulse (!) 106   Ht 5\' 8"  (1.727 m)   Wt 196 lb (88.9 kg)   BMI 29.80 kg/m  Constitutional: Pleasant,well-developed, Caucasian female in no acute distress. HEENT: Normocephalic and  atraumatic. Conjunctivae are normal. No scleral icterus. Cardiovascular: Normal rate, regular rhythm.  Pulmonary/chest: Effort normal and breath sounds normal. No wheezing, rales or rhonchi.  AICD palpable upper left chest wall Abdominal: Soft, nondistended, nontender. Bowel sounds active throughout. There are no masses palpable. No hepatomegaly. Extremities: no edema Neurological: Alert and oriented to person place and time. Skin: Skin is warm and dry. No rashes noted. Psychiatric: Normal mood and affect. Behavior is normal.  CBC    Component Value Date/Time   WBC 4.5 05/03/2020 1236   RBC 4.54 05/03/2020 1236   HGB 14.1 05/03/2020 1236   HGB 13.8 04/03/2018 1240   HCT 40.8 05/03/2020 1236   HCT 41.5 04/03/2018 1240   PLT 186 05/03/2020 1236   PLT 304 04/03/2018 1240   MCV 89.9 05/03/2020 1236   MCV 89 04/03/2018 1240   MCH 31.1 05/03/2020 1236   MCHC 34.6 05/03/2020 1236   RDW 12.9 05/03/2020 1236   RDW 13.0 04/03/2018 1240   LYMPHSABS 2.2 03/23/2018 1031   LYMPHSABS 2.7 03/12/2018 1043   MONOABS 0.4 03/23/2018 1031   EOSABS 0.2 03/23/2018 1031   EOSABS 0.1 03/12/2018 1043   BASOSABS 0.0 03/23/2018  1031   BASOSABS 0.1 03/12/2018 1043    CMP     Component Value Date/Time   NA 131 (L) 11/10/2020 1006   NA 138 04/03/2018 1240   K 3.8 11/10/2020 1006   CL 100 11/10/2020 1006   CO2 21 (L) 11/10/2020 1006   GLUCOSE 387 (H) 11/10/2020 1006   BUN 15 11/10/2020 1006   BUN 20 04/03/2018 1240   CREATININE 0.73 11/10/2020 1006   CREATININE 0.74 04/06/2014 0934   CALCIUM 8.2 (L) 11/10/2020 1006   PROT 7.5 11/10/2020 1006   PROT 6.5 03/26/2017 0916   ALBUMIN 4.0 11/10/2020 1006   ALBUMIN 4.0 03/26/2017 0916   AST 14 (L) 11/10/2020 1006   ALT 18 11/10/2020 1006   ALKPHOS 73 11/10/2020 1006   BILITOT 0.8 11/10/2020 1006   BILITOT 0.4 03/26/2017 0916   GFRNONAA >60 11/10/2020 1006   GFRAA >60 01/06/2020 1053     ASSESSMENT AND PLAN: 50 year old female with nonischemic cardiomyopathy with AICD due for initial average risk colon cancer screening.  Although she has increased perioperative risk due to her cardiomyopathy, I do not think that a stool based screening test would be best for her, given her history of recurrent hematochezia.,  Her functional status is fairly good, with minimal symptoms from her heart failure.  I think the risks of a colonoscopy are far outweighed by potential benefits.  The patient was in agreement.  We will plan for a screening colonoscopy in the hospital setting.  plan to hold her Eliquis the day before her procedure.  We will send a letter to her cardiologist to confirm that this is a reasonable risk.  As I currently have no hospital slots available to book, we will need to contact the patient when the slots become available.  Colon cancer screening - Screening colonoscopy, hospital setting, date to be determined - Hold Eliquis 1 day prior to procedure  The details, risks (including bleeding, perforation, infection, missed lesions, medication reactions and possible hospitalization or surgery if complications occur), benefits, and alternatives to colonoscopy with  possible biopsy and possible polypectomy were discussed with the patient and she consents to proceed.    Md Smola E. Candis Schatz, MD [facility]   CC:  Coral Spikes, DO

## 2021-03-12 ENCOUNTER — Ambulatory Visit (INDEPENDENT_AMBULATORY_CARE_PROVIDER_SITE_OTHER): Payer: 59

## 2021-03-12 DIAGNOSIS — Z95810 Presence of automatic (implantable) cardiac defibrillator: Secondary | ICD-10-CM

## 2021-03-12 DIAGNOSIS — I5022 Chronic systolic (congestive) heart failure: Secondary | ICD-10-CM

## 2021-03-14 ENCOUNTER — Ambulatory Visit (INDEPENDENT_AMBULATORY_CARE_PROVIDER_SITE_OTHER): Payer: 59 | Admitting: Nurse Practitioner

## 2021-03-14 ENCOUNTER — Encounter: Payer: Self-pay | Admitting: Nurse Practitioner

## 2021-03-14 ENCOUNTER — Other Ambulatory Visit: Payer: Self-pay

## 2021-03-14 VITALS — BP 110/73 | HR 105 | Ht 67.5 in | Wt 198.0 lb

## 2021-03-14 DIAGNOSIS — E559 Vitamin D deficiency, unspecified: Secondary | ICD-10-CM | POA: Diagnosis not present

## 2021-03-14 DIAGNOSIS — I10 Essential (primary) hypertension: Secondary | ICD-10-CM

## 2021-03-14 DIAGNOSIS — E782 Mixed hyperlipidemia: Secondary | ICD-10-CM

## 2021-03-14 DIAGNOSIS — E1165 Type 2 diabetes mellitus with hyperglycemia: Secondary | ICD-10-CM | POA: Diagnosis not present

## 2021-03-14 LAB — POCT GLYCOSYLATED HEMOGLOBIN (HGB A1C): HbA1c, POC (controlled diabetic range): 8.3 % — AB (ref 0.0–7.0)

## 2021-03-14 MED ORDER — METFORMIN HCL 500 MG PO TABS: 500.0000 mg | ORAL_TABLET | Freq: Two times a day (BID) | ORAL | 3 refills | Status: DC

## 2021-03-14 MED ORDER — NOVOLIN 70/30 FLEXPEN (70-30) 100 UNIT/ML ~~LOC~~ SUPN
PEN_INJECTOR | SUBCUTANEOUS | 1 refills | Status: DC
Start: 2021-03-14 — End: 2021-07-10

## 2021-03-14 NOTE — Patient Instructions (Signed)

## 2021-03-14 NOTE — Progress Notes (Signed)
03/14/2021, 11:38 AM  Endocrinology follow-up note                              Subjective:    [name], female    DOB: 09-Dec-1970.  Tara Bryant is being seen in follow-up in  management of currently uncontrolled symptomatic diabetes requested by  Coral Spikes, DO.   Past Medical History:  Diagnosis Date   AICD (automatic cardioverter/defibrillator) present    Arrhythmia    Arthritis    CAD (coronary artery disease)    a. 03/2018: cath showing 60% mid-LAD stenosis not significant by FFR.    Chronic combined systolic (congestive) and diastolic (congestive) heart failure ([place])    a. 03/2018: found to have a newly reduced EF of 20% by echocardiogram   Depression    denies   Diabetes mellitus    GERD (gastroesophageal reflux disease)    h/o of   Hypertension    Microproteinuria    S/P thyroid biopsy 2020   follow up once a year,    Sleep apnea    Past Surgical History:  Procedure Laterality Date   ANTERIOR CERVICAL DECOMP/DISCECTOMY FUSION N/A 12/03/2019   Procedure: ANTERIOR CERVICAL DECOMPRESSION/DISCECTOMY FUSION CERVICAL SIX- CERVICAL SEVEN;  Surgeon: Consuella Lose, MD;  Location: [facility];  Service: Neurosurgery;  Laterality: N/A;  ANTERIOR CERVICAL DECOMPRESSION/DISCECTOMY FUSION CERVICAL SIX- CERVICAL SEVEN   BACK SURGERY  2010   BREAST BIOPSY Left 2017   benign   CARDIAC CATHETERIZATION     ICD IMPLANT N/A 10/29/2018   Procedure: ICD IMPLANT;  Surgeon: Evans Lance, MD;  Location: [facility];  Service: Cardiovascular;  Laterality: N/A;   INTRAVASCULAR PRESSURE WIRE/FFR STUDY N/A 03/24/2018   Procedure: INTRAVASCULAR PRESSURE WIRE/FFR STUDY;  Surgeon: Leonie Man, MD;  Location: [facility];  Service: Cardiovascular;  Laterality: N/A;   NOSE SURGERY     RIGHT/LEFT HEART CATH AND CORONARY ANGIOGRAPHY N/A 03/24/2018   Procedure: RIGHT/LEFT HEART  CATH AND CORONARY ANGIOGRAPHY;  Surgeon: Leonie Man, MD;  Location: [facility];  Service: Cardiovascular;  Laterality: N/A;   TONSILLECTOMY     TOOTH EXTRACTION     September 2022   WISDOM TOOTH EXTRACTION     Social History   Socioeconomic History   Marital status: Divorced    Spouse name: Not on file   Number of children: 1   Years of education: Not on file   Highest education level: Not on file  Occupational History   Occupation: Home maker  Tobacco Use   Smoking status: Never   Smokeless tobacco: Never  Vaping Use   Vaping Use: Never used  Substance and Sexual Activity   Alcohol use: No   Drug use: No   Sexual activity: Yes    Birth control/protection: Pill  Other Topics Concern   Not on file  Social History Narrative   Not on file   Social Determinants of Health   Financial Resource Strain: Not on file  Food Insecurity: Not on file  Transportation Needs: Not on file  Physical Activity: Not on file  Stress: Not on file  Social Connections: Not on file   Outpatient Encounter Medications as of 03/14/2021  Medication Sig   apixaban (ELIQUIS) 5 MG TABS tablet Take 1 tablet (5 mg total) by mouth 2 (two) times daily.   atorvastatin (LIPITOR) 10 MG tablet Take 1 tablet (10 mg total) by mouth daily.   B-D ULTRAFINE III SHORT PEN 31G X 8 MM MISC USE TO INJECT INSULIN PEN TWICE DAILY   benzonatate (TESSALON) 100 MG capsule Take 1 capsule (100 mg total) by mouth 2 (two) times daily as needed for cough.   ENTRESTO 97-103 MG TAKE 1 TABLET BY MOUTH TWICE DAILY   furosemide (LASIX) 40 MG tablet Take 60 mg by mouth daily. Can Do additional 20 mg for SOB   glipiZIDE (GLUCOTROL) 5 MG tablet TAKE 1 TABLET(5 MG) BY MOUTH TWICE DAILY BEFORE A MEAL   glucose blood (FREESTYLE LITE) test strip Use as instructed   ivabradine (CORLANOR) 5 MG TABS tablet Take 1 tablet (5 mg total) by mouth 2 (two) times daily with a meal.   Lancets (ONETOUCH DELICA PLUS [id number]) MISC USE TO  TEST BLOOD SUGAR FOUR TIMES DAILY   metoprolol succinate (TOPROL-XL) 200 MG 24 hr tablet Take 1 tablet (200 mg total) by mouth daily.   NORLYDA 0.35 MG tablet Take 1 tablet by mouth at bedtime.    spironolactone (ALDACTONE) 25 MG tablet Take 12.5 mg by mouth daily.   [DISCONTINUED] metFORMIN (GLUCOPHAGE) 500 MG tablet Take 2 tablets (1,000 mg total) by mouth 2 (two) times daily with a meal. (Patient taking differently: Take 1,000 mg by mouth daily with breakfast.)   [DISCONTINUED] NOVOLIN 70/30 KWIKPEN (70-30) 100 UNIT/ML KwikPen INJECT 75 UNITS INTO THE SKIN AT BREAKFAST AND 90 UNITS AT DINNER WHEN GLUCOSE IS ABOVE 90   dapagliflozin propanediol (FARXIGA) 10 MG TABS tablet Take 1 tablet (10 mg total) by mouth daily. (Patient not taking: Reported on 03/07/2021)   insulin isophane & regular human KwikPen (NOVOLIN 70/30 KWIKPEN) (70-30) 100 UNIT/ML KwikPen 70 units with breakfast and 90 units with supper if glucose is above 90 and she is eating   metFORMIN (GLUCOPHAGE) 500 MG tablet Take 1 tablet (500 mg total) by mouth 2 (two) times daily with a meal.   No facility-administered encounter medications on file as of 03/14/2021.    ALLERGIES: Allergies  Allergen Reactions   Aspartame And Phenylalanine Dermatitis    VACCINATION STATUS: Immunization History  Administered Date(s) Administered   Influenza Split 02/02/2013   Influenza,inj,Quad PF,6+ Mos 01/11/2014, 12/15/2014, 12/15/2015, 01/03/2017, 01/28/2018, 02/03/2019, 02/17/2020, 02/16/2021   Influenza-Unspecified 03/08/2011   Pneumococcal Polysaccharide-23 03/30/2009   Td 02/04/2006   Tdap 02/26/2018    Diabetes She presents for her follow-up diabetic visit. She has type 2 diabetes mellitus. Onset time: She was diagnosed at approximate age of 50 years. Her disease course has been improving. Hypoglycemia symptoms include nervousness/anxiousness, sweats and tremors. Pertinent negatives for hypoglycemia include no confusion, headaches, pallor  or seizures. Associated symptoms include fatigue, visual change and weight loss. Pertinent negatives for diabetes include no chest pain, no polydipsia, no polyphagia and no polyuria. (Vaginal yeast infection) There are no hypoglycemic complications. Symptoms are improving. Diabetic complications include heart disease. Risk factors for coronary artery disease include diabetes mellitus, dyslipidemia, hypertension, family history, sedentary lifestyle and stress. Current diabetic treatment includes insulin injections and oral agent (dual therapy). She is compliant with treatment most of the time. Her weight is decreasing rapidly. She  is following a generally healthy diet. When asked about meal planning, she reported none. She has had a previous visit with a dietitian. She never participates in exercise. Her home blood glucose trend is decreasing steadily. Her overall blood glucose range is >200 mg/dl. (She presents today with her meter and logs showing improved, yet still above target fasting and postprandial glycemic profile.  Her POCT A1c today is 8.3%, improving drastically from last visit of 12.5%.  She has still not started taking her Wilder Glade that the cardiologist prescribed as she has an ongoing problem with yeast infections.  She has also been only taking 500 mg of her Metformin twice daily.  She says her vision temporarily worsened due to the sudden drop in glucose and that is why she reduced her dose of Metformin.  She did have a period of time where her glucose would drop before lunch but that has subsided without med changes.  She does report significant stress, living with mother.) An ACE inhibitor/angiotensin II receptor blocker is being taken. She does not see a podiatrist.Eye exam is current.  Hyperlipidemia This is a chronic problem. The current episode started more than 1 year ago. The problem is uncontrolled. Recent lipid tests were reviewed and are high. Exacerbating diseases include diabetes.  Factors aggravating her hyperlipidemia include beta blockers and fatty foods. Pertinent negatives include no chest pain, myalgias or shortness of breath. Current antihyperlipidemic treatment includes statins. The current treatment provides no improvement of lipids. Compliance problems include adherence to diet and adherence to exercise.  Risk factors for coronary artery disease include dyslipidemia, diabetes mellitus, hypertension, a sedentary lifestyle and family history.  Hypertension This is a chronic problem. The current episode started more than 1 year ago. The problem has been gradually improving since onset. The problem is controlled. Associated symptoms include sweats. Pertinent negatives include no chest pain, headaches, palpitations or shortness of breath. There are no associated agents to hypertension. Risk factors for coronary artery disease include diabetes mellitus, dyslipidemia, family history and sedentary lifestyle. Past treatments include beta blockers, diuretics and angiotensin blockers. The current treatment provides significant improvement. There are no compliance problems.  Hypertensive end-organ damage includes CAD/MI and heart failure.    Review of systems  Constitutional: + stable body weight,  current Body mass index is 30.55 kg/m. , no fatigue, no subjective hyperthermia, no subjective hypothermia Eyes: no blurry vision, no xerophthalmia ENT: no sore throat, no nodules palpated in throat, no dysphagia/odynophagia, no hoarseness Cardiovascular: no chest pain, some intermittent SOB r/t CHF, no palpitations, no leg swelling Respiratory: no cough, some intermittent shortness of breath r/t CHF Gastrointestinal: no nausea/vomiting/diarrhea Musculoskeletal: no muscle/joint aches Skin: no rashes, no hyperemia,  Neurological: no tremors, no numbness, no tingling, no dizziness Psychiatric: no depression, + intermittent anxiety    Objective:    BP 110/73   Pulse (!) 105   Ht  5' 7.5" (1.715 m)   Wt 198 lb (89.8 kg)   BMI 30.55 kg/m   Wt Readings from Last 3 Encounters:  03/14/21 198 lb (89.8 kg)  03/07/21 196 lb (88.9 kg)  02/12/21 195 lb 6.4 oz (88.6 kg)    BP Readings from Last 3 Encounters:  03/14/21 110/73  03/07/21 124/70  02/12/21 124/76      Physical Exam- Limited  Constitutional:  Body mass index is 30.55 kg/m. , not in acute distress, anxious state of mind Eyes:  EOMI, no exophthalmos Neck: Supple Cardiovascular: RRR, no murmurs, rubs, or gallops, no edema Respiratory: Adequate  breathing efforts, no crackles, rales, rhonchi, or wheezing Musculoskeletal: no gross deformities, strength intact in all four extremities, no gross restriction of joint movements Skin:  no rashes, no hyperemia Neurological: no tremor with outstretched hands    CMP ( most recent) CMP     Component Value Date/Time   NA 131 (L) 11/10/2020 1006   NA 138 04/03/2018 1240   K 3.8 11/10/2020 1006   CL 100 11/10/2020 1006   CO2 21 (L) 11/10/2020 1006   GLUCOSE 387 (H) 11/10/2020 1006   BUN 15 11/10/2020 1006   BUN 20 04/03/2018 1240   CREATININE 0.73 11/10/2020 1006   CREATININE 0.74 04/06/2014 0934   CALCIUM 8.2 (L) 11/10/2020 1006   PROT 7.5 11/10/2020 1006   PROT 6.5 03/26/2017 0916   ALBUMIN 4.0 11/10/2020 1006   ALBUMIN 4.0 03/26/2017 0916   AST 14 (L) 11/10/2020 1006   ALT 18 11/10/2020 1006   ALKPHOS 73 11/10/2020 1006   BILITOT 0.8 11/10/2020 1006   BILITOT 0.4 03/26/2017 0916   GFRNONAA >60 11/10/2020 1006   GFRAA >60 01/06/2020 1053     Diabetic Labs (most recent): Lab Results  Component Value Date   HGBA1C 8.3 (A) 03/14/2021   HGBA1C 12.5 (A) 12/12/2020   HGBA1C 9.3 (A) 08/16/2020     Lipid Panel ( most recent) Lipid Panel     Component Value Date/Time   CHOL 219 (H) 11/10/2020 1006   CHOL 202 (H) 03/26/2017 0916   TRIG 208 (H) 11/10/2020 1006   HDL 32 (L) 11/10/2020 1006   HDL 45 03/26/2017 0916   CHOLHDL 6.8 11/10/2020 1006    VLDL 42 (H) 11/10/2020 1006   LDLCALC 145 (H) 11/10/2020 1006   LDLCALC 134 (H) 03/26/2017 0916      Assessment & Plan:   1) Uncontrolled type 2 diabetes mellitus with hyperglycemia ([place])  - Tara Bryant has currently uncontrolled symptomatic type 2 DM since 50 years of age.  She presents today with her meter and logs showing improved, yet still above target fasting and postprandial glycemic profile.  Her POCT A1c today is 8.3%, improving drastically from last visit of 12.5%.  She has still not started taking her Wilder Glade that the cardiologist prescribed as she has an ongoing problem with yeast infections.  She has also been only taking 500 mg of her Metformin twice daily.  She says her vision temporarily worsened due to the sudden drop in glucose and that is why she reduced her dose of Metformin.  She did have a period of time where her glucose would drop before lunch but that has subsided without med changes.  She does report significant stress, living with mother.  her diabetes is complicated by coronary artery disease/CHF and she remains at a high risk for more acute and chronic complications which include CAD, CVA, CKD, retinopathy, and neuropathy. These are all discussed in detail with her.  - Nutritional counseling repeated at each appointment due to patients tendency to fall back in to old habits.  - The patient admits there is a room for improvement in their diet and drink choices. -  Suggestion is made for the patient to avoid simple carbohydrates from their diet including Cakes, Sweet Desserts / Pastries, Ice Cream, Soda (diet and regular), Sweet Tea, Candies, Chips, Cookies, Sweet Pastries, Store Bought Juices, Alcohol in Excess of 1-2 drinks a day, Artificial Sweeteners, Coffee Creamer, and "Sugar-free" Products. This will help patient to have stable blood glucose profile and potentially avoid  unintended weight gain.   - I encouraged the patient to switch to unprocessed or  minimally processed complex starch and increased protein intake (animal or plant source), fruits, and vegetables.   - Patient is advised to stick to a routine mealtimes to eat 3 meals a day and avoid unnecessary snacks (to snack only to correct hypoglycemia).  - I have approached her with the following individualized plan to manage diabetes and patient agrees:   -She has been more consistent with taking her insulin and it has helped tremendously.  -She is advised to continue current regimen of Novolog 70/30 70 units with breakfast and 90 units with supper if glucose is above 90 and she is eatiing.  She can also continue lower dose of Metformin 500 mg po twice daily with meals and Glipizide 5 mg po twice daily with meals.  Now that her vision is better, she can start to increase her Metformin slowly (100 mg with breakfast and 500 mg with supper for a few weeks) to help prevent worsening of her vision.  -She is still hesitant to start the Farxiga due to her ongoing problems with vaginal yeast.  She has discussed this with her cardiologist as well.  -She is urged to continue monitoring blood glucose at least three times per day, before injecting insulin (at breakfast and supper) and before bed.  She is encouraged to call the clinic if she has readings less than 70 or greater than 300 for 3 tests in a row.  - Patient specific target  A1c; LDL, HDL, Triglycerides, were discussed in detail.  2) Blood Pressure /Hypertension: Her blood pressure is controlled to target.  She is advised to continue Lasix 60 mg po daily, Metoprolol 200 mg po daily, and Spironolactone 12.5 mg daily.  She sees Dr. Haroldine Laws for CHF.  3) Lipids/Hyperlipidemia:  Her most recent lipid panel from 11/10/20 shows uncontrolled LDL of 145 and elevated triglycerides of 208.  She is advised to continue Lipitor 10 mg po daily at bedtime.  Side effects and precautions discussed with her.  4)  Weight/Diet:  Her Body mass index is 30.55  kg/m.-she is a candidate for mild weight loss.  I discussed with her the fact that loss of 5 - 10% of her  current body weight will have the most impact on her diabetes management.  CDE Consult will be initiated . Exercise, and detailed carbohydrates information provided  -  detailed on discharge instructions.  5) Chronic Care/Health Maintenance: -she is on ACEI/ARB medications and statin medications and is encouraged to initiate and continue to follow up with Ophthalmology, Dentist,  Podiatrist at least yearly or according to recommendations, and advised to  stay away from smoking. I have recommended yearly flu vaccine and pneumonia vaccine at least every 5 years; moderate intensity exercise for up to 150 minutes weekly; and  sleep for at least 7 hours a day.  - she is advised to maintain close follow up with Coral Spikes, DO for primary care needs, as well as her other providers for optimal and coordinated care.       I spent 45 minutes in the care of the patient today including review of labs from [facility], Lipids, Thyroid Function, Hematology (current and previous including abstractions from other facilities); face-to-face time discussing  her blood glucose readings/logs, discussing hypoglycemia and hyperglycemia episodes and symptoms, medications doses, her options of short and long term treatment based on the latest standards of care / guidelines;  discussion about  incorporating lifestyle medicine;  and documenting the encounter.    Please refer to Patient Instructions for Blood Glucose Monitoring and Insulin/Medications Dosing Guide"  in media tab for additional information. Please  also refer to " Patient Self Inventory" in the Media  tab for reviewed elements of pertinent patient history.  Tara Bryant participated in the discussions, expressed understanding, and voiced agreement with the above plans.  All questions were answered to her satisfaction. she is encouraged to contact clinic  should she have any questions or concerns prior to her return visit.    Follow up plan: - Return in about 3 months (around 06/12/2021) for Diabetes F/U with A1c in office, No previsit labs, Bring meter and logs.  Rayetta Pigg, [facility] [address] [place], [place] [address] Phone: [phone] Fax: [phone]  03/14/2021, 11:38 AM

## 2021-03-16 ENCOUNTER — Telehealth: Payer: Self-pay

## 2021-03-16 NOTE — Progress Notes (Signed)
EPIC Encounter for ICM Monitoring  Patient Name: Tara Bryant is a 50 y.o. female Date: 03/16/2021 Primary Care Physican: Coral Spikes, DO Primary Cardiologist: Bensimhon Electrophysiologist: Lovena Le 01/23/2021 Office Weight: 188 lbs                                Attempted call to patient and unable to reach.  Left detailed message per DPR regarding transmission. Transmission reviewed.    [employer] Heart Failure Index 0 suggesting normal fluid levels.     Prescribed: Furosemide 40 mg Take 60 mg by mouth daily. Can Do additional 20 mg for SOB Spironolactone 25 mg take 0.5 tablet (12.5 mg total) by mouth daily.   Recommendations:  Left voice mail with ICM number and encouraged to call if experiencing any fluid symptoms.   Follow-up plan: ICM clinic phone appointment on 04/16/2021.   91 day device clinic remote transmission 05/04/2021.              EP/Cardiology next office visit:  04/12/2021 with Dr Lovena Le.   10/03/2020 with [facility] PA was canceled and needs to reschedule.            Copy of ICM check sent to Dr. Lovena Le.    3 Month Trend    8 Day Data Trend          Rosalene Billings, RN 03/16/2021 4:28 PM

## 2021-03-16 NOTE — Telephone Encounter (Signed)
Remote ICM transmission received.  Attempted call to patient regarding ICM remote transmission and left detailed message per DPR.  Advised to return call for any fluid symptoms or questions. Next ICM remote transmission scheduled 04/16/2021.    

## 2021-04-12 ENCOUNTER — Encounter: Payer: Self-pay | Admitting: Internal Medicine

## 2021-04-12 ENCOUNTER — Other Ambulatory Visit: Payer: Self-pay

## 2021-04-12 ENCOUNTER — Ambulatory Visit (INDEPENDENT_AMBULATORY_CARE_PROVIDER_SITE_OTHER): Payer: 59 | Admitting: Internal Medicine

## 2021-04-12 VITALS — BP 114/62 | HR 102 | Ht 68.0 in | Wt 200.2 lb

## 2021-04-12 DIAGNOSIS — I10 Essential (primary) hypertension: Secondary | ICD-10-CM | POA: Diagnosis not present

## 2021-04-12 DIAGNOSIS — Z4502 Encounter for adjustment and management of automatic implantable cardiac defibrillator: Secondary | ICD-10-CM

## 2021-04-12 NOTE — Patient Instructions (Signed)
Medication Instructions:  Your physician recommends that you continue on your current medications as directed. Please refer to the Current Medication list given to you today.  *If you need a refill on your cardiac medications before your next appointment, please call your pharmacy*   Lab Work: NONE   If you have labs (blood work) drawn today and your tests are completely normal, you will receive your results only by: . MyChart Message (if you have MyChart) OR . A paper copy in the mail If you have any lab test that is abnormal or we need to change your treatment, we will call you to review the results.   Testing/Procedures: NONE    Follow-Up: At [facility], you and your health needs are our priority.  As part of our continuing mission to provide you with exceptional heart care, we have created designated Provider Care Teams.  These Care Teams include your primary Cardiologist (physician) and Advanced Practice Providers (APPs -  Physician Assistants and Nurse Practitioners) who all work together to provide you with the care you need, when you need it.  We recommend signing up for the patient portal called "MyChart".  Sign up information is provided on this After Visit Summary.  MyChart is used to connect with patients for Virtual Visits (Telemedicine).  Patients are able to view lab/test results, encounter notes, upcoming appointments, etc.  Non-urgent messages can be sent to your provider as well.   To learn more about what you can do with MyChart, go to [email or web address].    Your next appointment:   1 year(s)  The format for your next appointment:   In Person  Provider:   Gregg Taylor, MD   Other Instructions Thank you for choosing [facility]!    

## 2021-04-12 NOTE — Progress Notes (Signed)
HPI Tara Bryant returns today for followup. She is a pleasant 51 yo woman with a non-ischemic CM, chronic systolic heart failure, HTN, and obesity, s/p ICD insertion. She has been under more stress as she has had to move in with her mother. She denies chest pain or syncope or ICD shocks. No edema. She has class 2 CHF symptoms. She admits to being sedentary. Allergies  Allergen Reactions   Aspartame And Phenylalanine Dermatitis     Current Outpatient Medications  Medication Sig Dispense Refill   apixaban (ELIQUIS) 5 MG TABS tablet Take 1 tablet (5 mg total) by mouth 2 (two) times daily. 180 tablet 3   atorvastatin (LIPITOR) 10 MG tablet Take 1 tablet (10 mg total) by mouth daily. 90 tablet 1   B-D ULTRAFINE III SHORT PEN 31G X 8 MM MISC USE TO INJECT INSULIN PEN TWICE DAILY 100 each 3   benzonatate (TESSALON) 100 MG capsule Take 1 capsule (100 mg total) by mouth 2 (two) times daily as needed for cough. 20 capsule 0   ENTRESTO 97-103 MG TAKE 1 TABLET BY MOUTH TWICE DAILY 60 tablet 5   furosemide (LASIX) 40 MG tablet Take 60 mg by mouth daily. Can Do additional 20 mg for SOB     glipiZIDE (GLUCOTROL) 5 MG tablet TAKE 1 TABLET(5 MG) BY MOUTH TWICE DAILY BEFORE A MEAL 180 tablet 0   glucose blood (FREESTYLE LITE) test strip Use as instructed 100 each 12   insulin isophane & regular human KwikPen (NOVOLIN 70/30 KWIKPEN) (70-30) 100 UNIT/ML KwikPen 70 units with breakfast and 90 units with supper if glucose is above 90 and she is eating 60 mL 1   ivabradine (CORLANOR) 5 MG TABS tablet Take 1 tablet (5 mg total) by mouth 2 (two) times daily with a meal. 180 tablet 5   Lancets (ONETOUCH DELICA PLUS [id number]) MISC USE TO TEST BLOOD SUGAR FOUR TIMES DAILY     metFORMIN (GLUCOPHAGE) 500 MG tablet Take 1 tablet (500 mg total) by mouth 2 (two) times daily with a meal. 360 tablet 3   metoprolol succinate (TOPROL-XL) 200 MG 24 hr tablet Take 1 tablet (200 mg total) by mouth daily. 90 tablet 3    NORLYDA 0.35 MG tablet Take 1 tablet by mouth at bedtime.   12   spironolactone (ALDACTONE) 25 MG tablet Take 12.5 mg by mouth daily.     dapagliflozin propanediol (FARXIGA) 10 MG TABS tablet Take 1 tablet (10 mg total) by mouth daily. (Patient not taking: Reported on 04/12/2021) 90 tablet 1   No current facility-administered medications for this visit.     Past Medical History:  Diagnosis Date   AICD (automatic cardioverter/defibrillator) present    Arrhythmia    Arthritis    CAD (coronary artery disease)    a. 03/2018: cath showing 60% mid-LAD stenosis not significant by FFR.    Chronic combined systolic (congestive) and diastolic (congestive) heart failure ([facility])    a. 03/2018: found to have a newly reduced EF of 20% by echocardiogram   Depression    denies   Diabetes mellitus    GERD (gastroesophageal reflux disease)    h/o of   Hypertension    Microproteinuria    S/P thyroid biopsy 2020   follow up once a year,    Sleep apnea     ROS:   All systems reviewed and negative except as noted in the HPI.   Past Surgical History:  Procedure Laterality Date  ANTERIOR CERVICAL DECOMP/DISCECTOMY FUSION N/A 12/03/2019   Procedure: ANTERIOR CERVICAL DECOMPRESSION/DISCECTOMY FUSION CERVICAL SIX- CERVICAL SEVEN;  Surgeon: Consuella Lose, MD;  Location: [facility];  Service: Neurosurgery;  Laterality: N/A;  ANTERIOR CERVICAL DECOMPRESSION/DISCECTOMY FUSION CERVICAL SIX- CERVICAL SEVEN   BACK SURGERY  2010   BREAST BIOPSY Left 2017   benign   CARDIAC CATHETERIZATION     ICD IMPLANT N/A 10/29/2018   Procedure: ICD IMPLANT;  Surgeon: Evans Lance, MD;  Location: [facility];  Service: Cardiovascular;  Laterality: N/A;   INTRAVASCULAR PRESSURE WIRE/FFR STUDY N/A 03/24/2018   Procedure: INTRAVASCULAR PRESSURE WIRE/FFR STUDY;  Surgeon: Leonie Man, MD;  Location: [facility];  Service: Cardiovascular;  Laterality: N/A;   NOSE SURGERY     RIGHT/LEFT HEART CATH AND  CORONARY ANGIOGRAPHY N/A 03/24/2018   Procedure: RIGHT/LEFT HEART CATH AND CORONARY ANGIOGRAPHY;  Surgeon: Leonie Man, MD;  Location: [facility];  Service: Cardiovascular;  Laterality: N/A;   TONSILLECTOMY     TOOTH EXTRACTION     September 2022   WISDOM TOOTH EXTRACTION       Family History  Problem Relation Age of Onset   Breast cancer Mother    Hypertension Mother    Chronic Renal Failure Father    Stroke Sister 40       smoker   Breast cancer Sister    Diabetes Sister    Hypertension Sister    Heart attack Paternal Grandmother    Diabetes Maternal Aunt    Colon cancer Neg Hx      Social History   Socioeconomic History   Marital status: Divorced    Spouse name: Not on file   Number of children: 1   Years of education: Not on file   Highest education level: Not on file  Occupational History   Occupation: Home maker  Tobacco Use   Smoking status: Never   Smokeless tobacco: Never  Vaping Use   Vaping Use: Never used  Substance and Sexual Activity   Alcohol use: No   Drug use: No   Sexual activity: Yes    Birth control/protection: Pill  Other Topics Concern   Not on file  Social History Narrative   Not on file   Social Determinants of Health   Financial Resource Strain: Not on file  Food Insecurity: Not on file  Transportation Needs: Not on file  Physical Activity: Not on file  Stress: Not on file  Social Connections: Not on file  Intimate Partner Violence: Not on file     Ht 5\' 8"  (1.727 m)    Wt 200 lb 3.2 oz (90.8 kg)    BMI 30.44 kg/m   Physical Exam:  Overweight appearing NAD HEENT: Unremarkable Neck:  No JVD, no thyromegally Lymphatics:  No adenopathy Back:  No CVA tenderness Lungs:  Clear with no wheezes HEART:  Regular rate rhythm, no murmurs, no rubs, no clicks Abd:  soft, positive bowel sounds, no organomegally, no rebound, no guarding Ext:  2 plus pulses, no edema, no cyanosis, no clubbing Skin:  No rashes no  nodules Neuro:  CN II through XII intact, motor grossly intact  DEVICE  Normal device function.  See PaceArt for details.   Assess/Plan:  1. Chronic systolic heart failure - she has class 2 symptoms. I encouraged her to increase her physical activity. 2. ICD - her [employer] single chamber ICD is working normally. We will recheck in several months. 3. HTN - her  bp is up but she did not take her morning meds. I encouraged her to do so. 4. Obesity - I encouraged her to lose weight.    Carleene Overlie Schylar Wuebker,MD

## 2021-04-16 ENCOUNTER — Ambulatory Visit (INDEPENDENT_AMBULATORY_CARE_PROVIDER_SITE_OTHER): Payer: 59

## 2021-04-16 DIAGNOSIS — I5022 Chronic systolic (congestive) heart failure: Secondary | ICD-10-CM | POA: Diagnosis not present

## 2021-04-16 DIAGNOSIS — Z95810 Presence of automatic (implantable) cardiac defibrillator: Secondary | ICD-10-CM

## 2021-04-18 NOTE — Progress Notes (Signed)
EPIC Encounter for ICM Monitoring  Patient Name: SYNIA DOUGLASS is a 51 y.o. female Date: 04/18/2021 Primary Care Physican: Coral Spikes, DO Primary Cardiologist: Bensimhon Electrophysiologist: Lovena Le 01/23/2021 Office Weight: 188 lbs                                Transmission reviewed.    HeartLogic Heart Failure Index 6 suggesting normal fluid levels.     Prescribed: Furosemide 40 mg Take 60 mg by mouth daily. Can Do additional 20 mg for SOB Spironolactone 25 mg take 0.5 tablet (12.5 mg total) by mouth daily.   Recommendations:  No changes.   Follow-up plan: ICM clinic phone appointment on 05/21/2021.   91 day device clinic remote transmission 05/04/2021.              EP/Cardiology next office visit:  04/12/2021 with Dr Lovena Le.   10/03/2020 with [facility] was canceled and needs to reschedule.            Copy of ICM check sent to Dr. Lovena Le.    3 Month Trend    8 Day Data Trend          Rosalene Billings, RN 04/18/2021 8:53 AM

## 2021-05-04 ENCOUNTER — Ambulatory Visit (INDEPENDENT_AMBULATORY_CARE_PROVIDER_SITE_OTHER): Payer: 59

## 2021-05-04 DIAGNOSIS — I428 Other cardiomyopathies: Secondary | ICD-10-CM | POA: Diagnosis not present

## 2021-05-04 LAB — CUP PACEART REMOTE DEVICE CHECK
Battery Remaining Longevity: 156 mo
Battery Remaining Percentage: 100 %
Brady Statistic RV Percent Paced: 0 %
Date Time Interrogation Session: 20230127044100
HighPow Impedance: 80 Ohm
Implantable Lead Implant Date: 20200723
Implantable Lead Location: 753860
Implantable Lead Model: 293
Implantable Lead Serial Number: 443823
Implantable Pulse Generator Implant Date: 20200723
Lead Channel Impedance Value: 515 Ohm
Lead Channel Setting Pacing Amplitude: 3.5 V
Lead Channel Setting Pacing Pulse Width: 0.4 ms
Lead Channel Setting Sensing Sensitivity: 0.5 mV
Pulse Gen Serial Number: 256025

## 2021-05-14 NOTE — Progress Notes (Signed)
Remote ICD transmission.   

## 2021-05-21 ENCOUNTER — Ambulatory Visit (INDEPENDENT_AMBULATORY_CARE_PROVIDER_SITE_OTHER): Payer: 59

## 2021-05-21 DIAGNOSIS — I5022 Chronic systolic (congestive) heart failure: Secondary | ICD-10-CM | POA: Diagnosis not present

## 2021-05-21 DIAGNOSIS — Z95810 Presence of automatic (implantable) cardiac defibrillator: Secondary | ICD-10-CM

## 2021-05-24 ENCOUNTER — Telehealth: Payer: Self-pay

## 2021-05-24 NOTE — Progress Notes (Signed)
EPIC Encounter for ICM Monitoring  Patient Name: Tara Bryant is a 51 y.o. female Date: 05/24/2021 Primary Care Physican: Coral Spikes, DO Primary Cardiologist: Branch/Bensimhon Electrophysiologist: Lovena Le 04/12/2021 Office Weight: 200 lbs                                Attempted call to patient and unable to reach.  Left message to return call. Transmission reviewed.    HeartLogic Heart Failure Index 0 suggesting normal fluid levels.     Prescribed: Furosemide 40 mg Take 60 mg by mouth daily. Can Do additional 20 mg for SOB Spironolactone 25 mg take 0.5 tablet (12.5 mg total) by mouth daily.   Recommendations:  No changes.   Follow-up plan: ICM clinic phone appointment on 06/25/2021.   91 day device clinic remote transmission 08/03/2021.              EP/Cardiology next office visit: Recall 04/12/2022 with Dr Lovena Le.  Recall 08/11/2021 with Dr Harl Bowie.    10/03/2020 with [facility] was canceled and needs to reschedule.            Copy of ICM check sent to Dr. Lovena Le.  3 Month Trend    8 Day Data Trend          Rosalene Billings, RN 05/24/2021 1:59 PM

## 2021-05-24 NOTE — Telephone Encounter (Signed)
Remote ICM transmission received.  Attempted call to patient regarding ICM remote transmission and left message per DPR to return call.   

## 2021-06-12 ENCOUNTER — Ambulatory Visit: Payer: 59 | Admitting: Nurse Practitioner

## 2021-06-18 ENCOUNTER — Other Ambulatory Visit: Payer: Self-pay | Admitting: Nurse Practitioner

## 2021-06-19 ENCOUNTER — Telehealth: Payer: Self-pay | Admitting: Nurse Practitioner

## 2021-06-19 MED ORDER — CONTOUR TEST VI STRP: ORAL_STRIP | 12 refills | Status: DC

## 2021-06-19 MED ORDER — CONTOUR NEXT MONITOR W/DEVICE KIT: PACK | 0 refills | Status: DC

## 2021-06-19 NOTE — Telephone Encounter (Signed)
Pt called and since her insurance changed for 2023 she is needing a new brand of meter and test strip. ? ?She is needing the contour meter and contour test strips.  ? ?[id number] - [place], [place] - [address] AT [address] Phone:  [phone]  ?Fax:  [phone]  ?  ? ?

## 2021-06-25 ENCOUNTER — Telehealth: Payer: Self-pay

## 2021-06-25 ENCOUNTER — Ambulatory Visit (INDEPENDENT_AMBULATORY_CARE_PROVIDER_SITE_OTHER): Payer: 59

## 2021-06-25 DIAGNOSIS — I5022 Chronic systolic (congestive) heart failure: Secondary | ICD-10-CM | POA: Diagnosis not present

## 2021-06-25 DIAGNOSIS — Z95810 Presence of automatic (implantable) cardiac defibrillator: Secondary | ICD-10-CM | POA: Diagnosis not present

## 2021-06-25 NOTE — Progress Notes (Signed)
EPIC Encounter for ICM Monitoring ? ?Patient Name: Tara Bryant is a 51 y.o. female ?Date: 06/25/2021 ?Primary Care Physican: Coral Spikes, DO ?Primary Cardiologist: Branch/Bensimhon ?Electrophysiologist: Lovena Le ?04/12/2021 Office Weight: 200 lbs ?                             ?  ?Attempted call to patient and unable to reach, mail box is full. Transmission reviewed.  ?  ?HeartLogic Heart Failure Index 26 suggesting possible fluid accumulation starting 3/2.  Index trending back toward normal baseline and thoracic impedance trending up suggesting possible decrease in fluid accumulation.  ?  ?Prescribed: ?Furosemide 40 mg Take 60 mg by mouth daily. Can Do additional 20 mg for SOB ?Spironolactone 25 mg take 0.5 tablet (12.5 mg total) by mouth daily. ?  ?Recommendations: Unable to reach.   ?  ?Follow-up plan: ICM clinic phone appointment on 07/03/2021 to recheck fluid levels.   91 day device clinic remote transmission 08/03/2021.     ?  ?       EP/Cardiology next office visit: Recall 04/12/2022 with Dr Lovena Le.  Recall 08/11/2021 with Dr Harl Bowie.    10/03/2020 with [facility] was canceled and needs to reschedule.   ?  ?       Copy of ICM check sent to Dr. Lovena Le.  Will send to Dr Haroldine Laws for review if patient is reached.  ? ?3 Month Trend ? ? ? ?8 Day Data Trend ?       ? ? ?Rosalene Billings, RN ?06/25/2021 ?8:29 AM ?

## 2021-06-25 NOTE — Telephone Encounter (Signed)
Remote ICM transmission received.  Attempted call to patient regarding ICM remote transmission and mail box is full. 

## 2021-06-29 NOTE — Progress Notes (Addendum)
Spoke with patient and heart failure questions reviewed.  Pt reports swelling of feet.  Does not weigh at home, no scale.  She does not take Furosemide daily as prescribed and tries to take it several times a week.  Advised to take extra 20 mg tablet as prescribed which would equal 80 mg x 3 days.  After 3rd day should return to taking 60 mg daily.  She verbalized understanding.  Will recheck fluid levels 3/28.   Copy sent to Dr Harl Bowie for review.   ?

## 2021-07-03 ENCOUNTER — Ambulatory Visit (INDEPENDENT_AMBULATORY_CARE_PROVIDER_SITE_OTHER): Payer: 59

## 2021-07-03 DIAGNOSIS — Z95810 Presence of automatic (implantable) cardiac defibrillator: Secondary | ICD-10-CM

## 2021-07-03 DIAGNOSIS — I5022 Chronic systolic (congestive) heart failure: Secondary | ICD-10-CM

## 2021-07-03 NOTE — Progress Notes (Signed)
EPIC Encounter for ICM Monitoring ? ?Patient Name: Tara Bryant is a 51 y.o. female ?Date: 07/03/2021 ?Primary Care Physican: Coral Spikes, DO ?Primary Cardiologist: Branch/Bensimhon ?Electrophysiologist: Lovena Le ?04/12/2021 Office Weight: 200 lbs ?                             ?  ?Spoke with patient and heart failure questions reviewed.  Pt asymptomatic for fluid accumulation.  Reports feeling well at this time and voices no complaints.  ?  ?HeartLogic Heart Failure Index 1 suggesting fluid levels returned to normal after taking extra Furosemide.   ?  ?Prescribed: ?Furosemide 40 mg Take 60 mg by mouth daily. Can Do additional 20 mg for SOB ?Spironolactone 25 mg take 0.5 tablet (12.5 mg total) by mouth daily. ?  ?Recommendations: No changes and encouraged to call if experiencing any fluid symptoms.  ?  ?Follow-up plan: ICM clinic phone appointment on 07/30/2021.   91 day device clinic remote transmission 08/03/2021.     ?  ?       EP/Cardiology next office visit: Recall 04/12/2022 with Dr Lovena Le.  Recall 08/11/2021 with Dr Harl Bowie.    10/03/2020 with [facility] was canceled and needs to reschedule.   ?  ?       Copy of ICM check sent to Dr. Lovena Le. ? ?3 Month Trend ? ? ? ?8 Day Data Trend ?       ? ? ?Rosalene Billings, RN ?07/03/2021 ?8:59 AM ?

## 2021-07-10 ENCOUNTER — Ambulatory Visit (INDEPENDENT_AMBULATORY_CARE_PROVIDER_SITE_OTHER): Payer: 59 | Admitting: Nurse Practitioner

## 2021-07-10 ENCOUNTER — Encounter: Payer: Self-pay | Admitting: Nurse Practitioner

## 2021-07-10 VITALS — BP 124/82 | HR 102 | Ht 67.5 in | Wt 201.2 lb

## 2021-07-10 DIAGNOSIS — E1165 Type 2 diabetes mellitus with hyperglycemia: Secondary | ICD-10-CM | POA: Diagnosis not present

## 2021-07-10 DIAGNOSIS — E559 Vitamin D deficiency, unspecified: Secondary | ICD-10-CM

## 2021-07-10 DIAGNOSIS — E782 Mixed hyperlipidemia: Secondary | ICD-10-CM

## 2021-07-10 DIAGNOSIS — I10 Essential (primary) hypertension: Secondary | ICD-10-CM | POA: Diagnosis not present

## 2021-07-10 LAB — POCT GLYCOSYLATED HEMOGLOBIN (HGB A1C): HbA1c, POC (controlled diabetic range): 9.7 % — AB (ref 0.0–7.0)

## 2021-07-10 MED ORDER — GLIPIZIDE 5 MG PO TABS: 5.0000 mg | ORAL_TABLET | Freq: Two times a day (BID) | ORAL | 3 refills | Status: DC

## 2021-07-10 MED ORDER — NOVOLIN 70/30 FLEXPEN (70-30) 100 UNIT/ML ~~LOC~~ SUPN: PEN_INJECTOR | SUBCUTANEOUS | 3 refills | Status: DC

## 2021-07-10 NOTE — Progress Notes (Signed)
?                                                   ?  ? ?    07/10/2021, 11:57 AM ? ?Endocrinology follow-up note ?                           ? ? ?Subjective:  ? ? [name], female    DOB: 07-Aug-1970.  ?Tara Bryant is being seen in follow-up in  management of currently uncontrolled symptomatic diabetes requested by  Coral Spikes, DO. ? ? ?Past Medical History:  ?Diagnosis Date  ? AICD (automatic cardioverter/defibrillator) present   ? Arrhythmia   ? Arthritis   ? CAD (coronary artery disease)   ? a. 03/2018: cath showing 60% mid-LAD stenosis not significant by FFR.   ? Chronic combined systolic (congestive) and diastolic (congestive) heart failure ([facility])   ? a. 03/2018: found to have a newly reduced EF of 20% by echocardiogram  ? Depression   ? denies  ? Diabetes mellitus   ? GERD (gastroesophageal reflux disease)   ? h/o of  ? Hypertension   ? Microproteinuria   ? S/P thyroid biopsy 2020  ? follow up once a year,   ? Sleep apnea   ? ?Past Surgical History:  ?Procedure Laterality Date  ? ANTERIOR CERVICAL DECOMP/DISCECTOMY FUSION N/A 12/03/2019  ? Procedure: ANTERIOR CERVICAL DECOMPRESSION/DISCECTOMY FUSION CERVICAL SIX- CERVICAL SEVEN;  Surgeon: Consuella Lose, MD;  Location: [facility];  Service: Neurosurgery;  Laterality: N/A;  ANTERIOR CERVICAL DECOMPRESSION/DISCECTOMY FUSION CERVICAL SIX- CERVICAL SEVEN  ? BACK SURGERY  2010  ? BREAST BIOPSY Left 2017  ? benign  ? CARDIAC CATHETERIZATION    ? ICD IMPLANT N/A 10/29/2018  ? Procedure: ICD IMPLANT;  Surgeon: Evans Lance, MD;  Location: [facility];  Service: Cardiovascular;  Laterality: N/A;  ? INTRAVASCULAR PRESSURE WIRE/FFR STUDY N/A 03/24/2018  ? Procedure: INTRAVASCULAR PRESSURE WIRE/FFR STUDY;  Surgeon: Leonie Man, MD;  Location: [facility];  Service: Cardiovascular;  Laterality: N/A;  ? NOSE SURGERY    ? RIGHT/LEFT HEART CATH AND CORONARY ANGIOGRAPHY N/A 03/24/2018  ? Procedure: RIGHT/LEFT HEART  CATH AND CORONARY ANGIOGRAPHY;  Surgeon: Leonie Man, MD;  Location: [facility];  Service: Cardiovascular;  Laterality: N/A;  ? TONSILLECTOMY    ? TOOTH EXTRACTION    ? September 2022  ? WISDOM TOOTH EXTRACTION    ? ?Social History  ? ?Socioeconomic History  ? Marital status: Divorced  ?  Spouse name: Not on file  ? Number of children: 1  ? Years of education: Not on file  ? Highest education level: Not on file  ?Occupational History  ? Occupation: Home maker  ?Tobacco Use  ? Smoking status: Never  ? Smokeless tobacco: Never  ?Vaping Use  ? Vaping Use: Never used  ?Substance and Sexual Activity  ? Alcohol use: No  ? Drug use: No  ? Sexual activity: Yes  ?  Birth control/protection: Pill  ?Other Topics Concern  ? Not on file  ?Social History Narrative  ? Not on file  ? ?Social Determinants of Health  ? ?Financial Resource Strain: Not on file  ?Food Insecurity: Not on file  ?Transportation Needs: Not on file  ?  Physical Activity: Not on file  ?Stress: Not on file  ?Social Connections: Not on file  ? ?Outpatient Encounter Medications as of 07/10/2021  ?Medication Sig  ? apixaban (ELIQUIS) 5 MG TABS tablet Take 1 tablet (5 mg total) by mouth 2 (two) times daily.  ? atorvastatin (LIPITOR) 10 MG tablet Take 1 tablet (10 mg total) by mouth daily.  ? B-D ULTRAFINE III SHORT PEN 31G X 8 MM MISC USE TO INJECT INSULIN PEN TWICE DAILY  ? benzonatate (TESSALON) 100 MG capsule Take 1 capsule (100 mg total) by mouth 2 (two) times daily as needed for cough.  ? Blood Glucose Monitoring Suppl (CONTOUR NEXT MONITOR) w/Device KIT Use to monitor glucose 3 times daily as instructed  ? ENTRESTO 97-103 MG TAKE 1 TABLET BY MOUTH TWICE DAILY  ? furosemide (LASIX) 40 MG tablet Take 60 mg by mouth daily. Can Do additional 20 mg for SOB  ? glucose blood (CONTOUR TEST) test strip Use as instructed to monitor glucose 3 times daily as instructed  ? ivabradine (CORLANOR) 5 MG TABS tablet Take 1 tablet (5 mg total) by mouth 2 (two) times  daily with a meal.  ? Lancets (ONETOUCH DELICA PLUS [id number]) MISC USE TO TEST BLOOD SUGAR FOUR TIMES DAILY  ? metFORMIN (GLUCOPHAGE) 500 MG tablet Take 1 tablet (500 mg total) by mouth 2 (two) times daily with a meal.  ? metoprolol succinate (TOPROL-XL) 200 MG 24 hr tablet Take 1 tablet (200 mg total) by mouth daily.  ? NORLYDA 0.35 MG tablet Take 1 tablet by mouth at bedtime.   ? spironolactone (ALDACTONE) 25 MG tablet Take 12.5 mg by mouth daily.  ? [DISCONTINUED] glipiZIDE (GLUCOTROL) 5 MG tablet TAKE 1 TABLET(5 MG) BY MOUTH TWICE DAILY BEFORE A MEAL  ? [DISCONTINUED] insulin isophane & regular human KwikPen (NOVOLIN 70/30 KWIKPEN) (70-30) 100 UNIT/ML KwikPen 70 units with breakfast and 90 units with supper if glucose is above 90 and she is eating  ? dapagliflozin propanediol (FARXIGA) 10 MG TABS tablet Take 1 tablet (10 mg total) by mouth daily. (Patient not taking: Reported on 04/12/2021)  ? glipiZIDE (GLUCOTROL) 5 MG tablet Take 1 tablet (5 mg total) by mouth 2 (two) times daily before a meal.  ? insulin isophane & regular human KwikPen (NOVOLIN 70/30 KWIKPEN) (70-30) 100 UNIT/ML KwikPen 75 units with breakfast and 90 units with supper if glucose is above 90 and she is eating  ? ?No facility-administered encounter medications on file as of 07/10/2021.  ? ? ?ALLERGIES: ?Allergies  ?Allergen Reactions  ? Aspartame And Phenylalanine Dermatitis  ? ? ?VACCINATION STATUS: ?Immunization History  ?Administered Date(s) Administered  ? Influenza Split 02/02/2013  ? Influenza,inj,Quad PF,6+ Mos 01/11/2014, 12/15/2014, 12/15/2015, 01/03/2017, 01/28/2018, 02/03/2019, 02/17/2020, 02/16/2021  ? Influenza-Unspecified 03/08/2011  ? Pneumococcal Polysaccharide-23 03/30/2009  ? Td 02/04/2006  ? Tdap 02/26/2018  ? ? ?Diabetes ?She presents for her follow-up diabetic visit. She has type 2 diabetes mellitus. Onset time: She was diagnosed at approximate age of 89 years. Her disease course has been worsening. There are no hypoglycemic  associated symptoms. Pertinent negatives for hypoglycemia include no confusion, headaches, pallor or seizures. Associated symptoms include fatigue and visual change. Pertinent negatives for diabetes include no chest pain, no polydipsia, no polyphagia and no polyuria. (Vaginal yeast infection) There are no hypoglycemic complications. Symptoms are stable. Diabetic complications include heart disease. Risk factors for coronary artery disease include diabetes mellitus, dyslipidemia, hypertension, family history, sedentary lifestyle and stress. Current diabetic treatment includes insulin injections  and oral agent (dual therapy). She is compliant with treatment some of the time (often missed her second dose of Metformin and Glipizide). Her weight is decreasing rapidly. She is following a generally healthy diet. When asked about meal planning, she reported none. She has had a previous visit with a dietitian. She never participates in exercise. Her home blood glucose trend is increasing steadily. Her breakfast blood glucose range is generally >200 mg/dl. Her dinner blood glucose range is generally >200 mg/dl. Her overall blood glucose range is >200 mg/dl. (She presents today with her meter and logs showing above target glycemic profile overall.  Her POCT A1c today is 9.7%, worsening from previous visit of 8.3%.  She reports she often forgets to take her second dose of Metformin and Glipizide during the day and reports she still has great amount of stress which is contributing to her inability to control chronic diseases.  Analysis of her meter shows 7-day average of 288, 14-day average of 248, 30-day average of 264, and 90-day average of 264.) An ACE inhibitor/angiotensin II receptor blocker is being taken. She does not see a podiatrist.Eye exam is current.  ?Hyperlipidemia ?This is a chronic problem. The current episode started more than 1 year ago. The problem is uncontrolled. Recent lipid tests were reviewed and are  high. Exacerbating diseases include diabetes. Factors aggravating her hyperlipidemia include beta blockers and fatty foods. Pertinent negatives include no chest pain, myalgias or shortness of breath. Curren

## 2021-07-10 NOTE — Patient Instructions (Signed)

## 2021-08-03 ENCOUNTER — Ambulatory Visit (INDEPENDENT_AMBULATORY_CARE_PROVIDER_SITE_OTHER): Payer: 59

## 2021-08-03 DIAGNOSIS — I5022 Chronic systolic (congestive) heart failure: Secondary | ICD-10-CM

## 2021-08-03 DIAGNOSIS — I428 Other cardiomyopathies: Secondary | ICD-10-CM | POA: Diagnosis not present

## 2021-08-03 LAB — CUP PACEART REMOTE DEVICE CHECK
Battery Remaining Longevity: 150 mo
Battery Remaining Percentage: 100 %
Brady Statistic RV Percent Paced: 0 %
Date Time Interrogation Session: 20230428044100
HighPow Impedance: 77 Ohm
Implantable Lead Implant Date: 20200723
Implantable Lead Location: 753860
Implantable Lead Model: 293
Implantable Lead Serial Number: 443823
Implantable Pulse Generator Implant Date: 20200723
Lead Channel Impedance Value: 496 Ohm
Lead Channel Setting Pacing Amplitude: 3.5 V
Lead Channel Setting Pacing Pulse Width: 0.4 ms
Lead Channel Setting Sensing Sensitivity: 0.5 mV
Pulse Gen Serial Number: 256025

## 2021-08-07 ENCOUNTER — Ambulatory Visit (INDEPENDENT_AMBULATORY_CARE_PROVIDER_SITE_OTHER): Payer: 59

## 2021-08-07 DIAGNOSIS — Z95810 Presence of automatic (implantable) cardiac defibrillator: Secondary | ICD-10-CM | POA: Diagnosis not present

## 2021-08-07 DIAGNOSIS — I5022 Chronic systolic (congestive) heart failure: Secondary | ICD-10-CM

## 2021-08-08 ENCOUNTER — Telehealth: Payer: Self-pay

## 2021-08-08 NOTE — Telephone Encounter (Signed)
Remote ICM transmission received.  Attempted call to patient regarding ICM remote transmission and left detailed message per DPR to return call.   

## 2021-08-08 NOTE — Progress Notes (Signed)
EPIC Encounter for ICM Monitoring ? ?Patient Name: Tara Bryant is a 52 y.o. female ?Date: 08/08/2021 ?Primary Care Physican: Coral Spikes, DO ?Primary Cardiologist: Branch/Bensimhon ?Electrophysiologist: Lovena Le ?04/12/2021 Office Weight: 200 lbs ?                             ?  ?Attempted call to patient and unable to reach.  Left message to return call. Transmission reviewed.  ?  ?HeartLogic Heart Failure Index 46 suggesting possible fluid accumulation starting 4/25.   ?  ?Prescribed: ?Furosemide 40 mg Take 60 mg by mouth daily. Can Do additional 20 mg for SOB ?Spironolactone 25 mg take 0.5 tablet (12.5 mg total) by mouth daily. ?  ?Recommendations:  Unable to reach.   ?  ?Follow-up plan: ICM clinic phone appointment on 08/14/2021 to recheck fluid levels.   91 day device clinic remote transmission 08/03/2021.     ?  ?       EP/Cardiology next office visit: Recall 04/12/2022 with Dr Lovena Le.  Recall 08/11/2021 with Dr Harl Bowie.    Last HF clinic visit was 08/31/2020.   ?  ?       Copy of ICM check sent to Dr. Lovena Le.   Will send to Dr Haroldine Laws for review if patient is reached. ? ?3 Month Trend ? ? ? ? ?8 Day Data Trend ?       ? ? ?Rosalene Billings, RN ?08/08/2021 ?3:03 PM ?

## 2021-08-10 NOTE — Progress Notes (Signed)
Spoke with patient and heart failure questions reviewed.  Pt has bloating in abdomen and denies LLE or SOB.  No scales at home.  She is does not take Furosemide daily as prescribed and took dosage 3 days in past week.  She ate a lot of ham and hot dogs in the past week.   Advised eating foods high in salt and not taking diuretic is more than likely contributing to fluid accumulation.  Explained the importance of following low salt diet and taking medication as prescribed.  Advised to take extra 20 mg with prescribed dosage x 2 days and then return to prescribed dosage and taking daily as prescribed.  Will recheck fluid levels on 5/9.   ? ?Copy sent to Dr Haroldine Laws for review.   ?

## 2021-08-14 ENCOUNTER — Ambulatory Visit (INDEPENDENT_AMBULATORY_CARE_PROVIDER_SITE_OTHER): Payer: 59

## 2021-08-14 ENCOUNTER — Telehealth: Payer: Self-pay

## 2021-08-14 DIAGNOSIS — I5022 Chronic systolic (congestive) heart failure: Secondary | ICD-10-CM

## 2021-08-14 DIAGNOSIS — Z95810 Presence of automatic (implantable) cardiac defibrillator: Secondary | ICD-10-CM

## 2021-08-14 NOTE — Progress Notes (Signed)
EPIC Encounter for ICM Monitoring ? ?Patient Name: Tara Bryant is a 51 y.o. female ?Date: 08/14/2021 ?Primary Care Physican: Coral Spikes, DO ?Primary Cardiologist: Branch/Bensimhon ?Electrophysiologist: Lovena Le ?04/12/2021 Office Weight: 200 lbs (not weighing at home) ?                             ?  ?Spoke with patient and heart failure questions reviewed.  Pt asymptomatic for fluid accumulation.  She did not take extra 20 mg Furosemide last week as recommended but did take regular 60 mg dosage daily without skipping any days. ?  ?HeartLogic Heart Failure Index increased from 46 to 52 suggesting possible fluid accumulation starting 4/25.  Thoracic impedance is trending up. ?  ?Prescribed: ?Furosemide 40 mg Take 60 mg by mouth daily. Can Do additional 20 mg for SOB ?Spironolactone 25 mg take 0.5 tablet (12.5 mg total) by mouth daily. ?  ?Recommendations:  Advised to take Furosemide 80 mg x 2 days only and return to 60 mg daly after 2nd day.  Advised to limit salt intake.  ?  ?Follow-up plan: ICM clinic phone appointment on 08/21/2021 to recheck fluid levels.   91 day device clinic remote transmission 11/02/2021.     ?  ?       EP/Cardiology next office visit: Recall 04/12/2022 with Dr Lovena Le.  Recall 08/11/2021 with Dr Harl Bowie.    Last HF clinic visit was 08/31/2020.   ?  ?       Copy of ICM check sent to Dr. Lovena Le.   Sent to Dr Haroldine Laws for review and recommendations if needed.  ? ?3 month ICM trend: 08/13/2021. ? ? ? ?8 day trend:  ? ? ? ?Rosalene Billings, RN ?08/14/2021 ?7:38 AM ? ?

## 2021-08-14 NOTE — Telephone Encounter (Signed)
Remote ICM transmission received.  Attempted call to patient regarding ICM remote transmission and left message to return call   

## 2021-08-20 NOTE — Progress Notes (Signed)
Remote ICD transmission.   

## 2021-08-21 ENCOUNTER — Ambulatory Visit (INDEPENDENT_AMBULATORY_CARE_PROVIDER_SITE_OTHER): Payer: 59

## 2021-08-21 DIAGNOSIS — I5022 Chronic systolic (congestive) heart failure: Secondary | ICD-10-CM

## 2021-08-21 DIAGNOSIS — Z95810 Presence of automatic (implantable) cardiac defibrillator: Secondary | ICD-10-CM

## 2021-08-21 NOTE — Progress Notes (Signed)
Bensimhon, Shaune Pascal, MD  Tyshell Ramberg Panda, RN ?Thanks. Let me know if not coming down. If so, we can try a dose of metolazone 2.5 with KCL 40.    ? ?Best -dan  ?

## 2021-08-21 NOTE — Progress Notes (Addendum)
EPIC Encounter for ICM Monitoring ? ?Patient Name: Tara Bryant is a 51 y.o. female ?Date: 08/21/2021 ?Primary Care Physican: Coral Spikes, DO ?Primary Cardiologist: Branch/Bensimhon ?Electrophysiologist: Lovena Le ?04/12/2021 Office Weight: 200 lbs (not weighing at home) ?                             ?  ?Spoke with patient and heart failure questions reviewed.  Pt asymptomatic for fluid accumulation.  ?  ?HeartLogic Heart Failure Index decreased from 52 to 22 suggesting fluid levels returning to normal.  Thoracic impedance improving and trending up. ?  ?Prescribed: ?Furosemide 40 mg Take 60 mg by mouth daily. Can Do additional 20 mg for SOB ?Spironolactone 25 mg take 0.5 tablet (12.5 mg total) by mouth daily. ?  ?Recommendations:  Advised to take Furosemide 80 mg x additional 2 days only and return to 60 mg daly after 2nd day.  Advised to limit salt and fluid intake.  ?  ?Follow-up plan: ICM clinic phone appointment on 08/24/2021 to recheck fluid levels.   91 day device clinic remote transmission 11/02/2021.     ?  ?       EP/Cardiology next office visit: Recall 04/12/2022 with Dr Lovena Le.  08/27/2021 with Dr Harl Bowie.   Advised to contact HF clinic to schedule overdue appointment.  Last visit was 08/31/2020.   ?  ?       Copy of ICM check sent to Dr. Lovena Le.  Sent to Dr Haroldine Laws as Juluis Rainier that HF Index is coming down.   ? ?3 Month Trend ? ? ? ?8 Day Data Trend ?       ? ? ?Rosalene Billings, RN ?08/21/2021 ?9:13 AM ?

## 2021-08-24 NOTE — Progress Notes (Signed)
No ICM remote transmission received for 08/24/2021 and next ICM transmission scheduled for 08/28/2021.

## 2021-08-27 ENCOUNTER — Encounter: Payer: Self-pay | Admitting: Cardiology

## 2021-08-27 ENCOUNTER — Ambulatory Visit: Payer: 59 | Admitting: Cardiology

## 2021-08-27 VITALS — BP 122/72 | HR 66 | Ht 67.5 in | Wt 197.0 lb

## 2021-08-27 DIAGNOSIS — I5022 Chronic systolic (congestive) heart failure: Secondary | ICD-10-CM | POA: Diagnosis not present

## 2021-08-27 DIAGNOSIS — I2510 Atherosclerotic heart disease of native coronary artery without angina pectoris: Secondary | ICD-10-CM

## 2021-08-27 MED ORDER — METOPROLOL SUCCINATE ER 25 MG PO TB24
25.0000 mg | ORAL_TABLET | Freq: Every day | ORAL | 6 refills | Status: DC
Start: 2021-08-27 — End: 2021-10-16

## 2021-08-27 MED ORDER — SACUBITRIL-VALSARTAN 24-26 MG PO TABS: 1.0000 | ORAL_TABLET | Freq: Two times a day (BID) | ORAL | 6 refills | Status: DC

## 2021-08-27 NOTE — Progress Notes (Signed)
Clinical Summary Ms. Teska is a 51 y.o.female seen today for follow up of the following medical problems.        1. 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    - 08/2018 echo: LVEF 25-30%, grade II diastolic dysfunction, mild RV dysfunction - 10/29/18 ICD implant [employer]  - followed by CHF clinic, has had close f/u with CHF pharmacy clinic as well. CPX 05/2020   11/2019 echo LVEF 25-30%   - had some recent elevated fluid levels by ICD check, on repeat had improved - has not started farxiga, wanted to see gyn first about recent issues with yeast infections - has stopped home meds, recent move and significant social stress related to separation. Working to get back on meds now.      2. Pulmonary hypertension - type II, her mean PA was 45 and PCWP 41 in setting of severel volume overload. Moderate RV dysfunction by MRI   3. CAD - moderate nonobstructive disease by cath    - no symptoms.      4. Sinus tachcyardia -  She has had prior visits with sinus tach of unclear etiology.        5. OSA - moderate OSA by 01/2019 sleep study - followed by Dr Elsworth Soho - never got cpap machine, has been dealing with several social stressors   6. 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 Daughter is 39 graduating high school this year, interested in CNA program and  ultimately nursing.      Past Medical History:  Diagnosis Date   AICD (automatic cardioverter/defibrillator) present    Arrhythmia    Arthritis    CAD (coronary artery disease)    a. 03/2018: cath showing 60% mid-LAD stenosis not significant by FFR.    Chronic combined systolic (congestive) and diastolic (congestive) heart failure ([place])    a. 03/2018: found to have a newly reduced EF of 20% by echocardiogram   Depression    denies   Diabetes mellitus    GERD (gastroesophageal reflux disease)    h/o of   Hypertension    Microproteinuria    S/P thyroid biopsy 2020   follow up once a year,    Sleep apnea      Allergies  Allergen Reactions   Aspartame And Phenylalanine Dermatitis     Current Outpatient Medications  Medication Sig Dispense Refill   apixaban (ELIQUIS) 5 MG TABS tablet Take 1 tablet (5 mg total) by mouth 2 (two) times daily. 180 tablet 3   atorvastatin (LIPITOR) 10 MG tablet Take 1 tablet (10 mg total) by mouth daily. 90 tablet 1   B-D ULTRAFINE III SHORT PEN 31G X 8 MM MISC USE TO INJECT INSULIN PEN TWICE DAILY 100 each 3   benzonatate (TESSALON) 100 MG capsule Take 1 capsule (100 mg total)  by mouth 2 (two) times daily as needed for cough. 20 capsule 0   Blood Glucose Monitoring Suppl (CONTOUR NEXT MONITOR) w/Device KIT Use to monitor glucose 3 times daily as instructed 1 kit 0   dapagliflozin propanediol (FARXIGA) 10 MG TABS tablet Take 1 tablet (10 mg total) by mouth daily. (Patient not taking: Reported on 04/12/2021) 90 tablet 1   ENTRESTO 97-103 MG TAKE 1 TABLET BY MOUTH TWICE DAILY 60 tablet 5   furosemide (LASIX) 40 MG tablet Take 60 mg by mouth daily. Can Do additional 20 mg for SOB     glipiZIDE (GLUCOTROL) 5 MG tablet Take 1 tablet (5 mg total) by mouth 2 (two) times daily before a meal. 180 tablet 3   glucose blood (CONTOUR TEST) test strip Use as instructed to monitor glucose 3 times daily as instructed 200 each 12   insulin isophane & regular human  KwikPen (NOVOLIN 70/30 KWIKPEN) (70-30) 100 UNIT/ML KwikPen 75 units with breakfast and 90 units with supper if glucose is above 90 and she is eating 60 mL 3   ivabradine (CORLANOR) 5 MG TABS tablet Take 1 tablet (5 mg total) by mouth 2 (two) times daily with a meal. 180 tablet 5   Lancets (ONETOUCH DELICA PLUS [id number]) MISC USE TO TEST BLOOD SUGAR FOUR TIMES DAILY     metFORMIN (GLUCOPHAGE) 500 MG tablet Take 1 tablet (500 mg total) by mouth 2 (two) times daily with a meal. 360 tablet 3   metoprolol succinate (TOPROL-XL) 200 MG 24 hr tablet Take 1 tablet (200 mg total) by mouth daily. 90 tablet 3   NORLYDA 0.35 MG tablet Take 1 tablet by mouth at bedtime.   12   spironolactone (ALDACTONE) 25 MG tablet Take 12.5 mg by mouth daily.     No current facility-administered medications for this visit.     Past Surgical History:  Procedure Laterality Date   ANTERIOR CERVICAL DECOMP/DISCECTOMY FUSION N/A 12/03/2019   Procedure: ANTERIOR CERVICAL DECOMPRESSION/DISCECTOMY FUSION CERVICAL SIX- CERVICAL SEVEN;  Surgeon: Consuella Lose, MD;  Location: [place];  Service: Neurosurgery;  Laterality: N/A;  ANTERIOR CERVICAL DECOMPRESSION/DISCECTOMY FUSION CERVICAL SIX- CERVICAL SEVEN   BACK SURGERY  2010   BREAST BIOPSY Left 2017   benign   CARDIAC CATHETERIZATION     ICD IMPLANT N/A 10/29/2018   Procedure: ICD IMPLANT;  Surgeon: Evans Lance, MD;  Location: [place] [facility];  Service: Cardiovascular;  Laterality: N/A;   INTRAVASCULAR PRESSURE WIRE/FFR STUDY N/A 03/24/2018   Procedure: INTRAVASCULAR PRESSURE WIRE/FFR STUDY;  Surgeon: Leonie Man, MD;  Location: [place] [facility];  Service: Cardiovascular;  Laterality: N/A;   NOSE SURGERY     RIGHT/LEFT HEART CATH AND CORONARY ANGIOGRAPHY N/A 03/24/2018   Procedure: RIGHT/LEFT HEART CATH AND CORONARY ANGIOGRAPHY;  Surgeon: Leonie Man, MD;  Location: [place] [facility];  Service: Cardiovascular;  Laterality: N/A;   TONSILLECTOMY      TOOTH EXTRACTION     September 2022   WISDOM TOOTH EXTRACTION       Allergies  Allergen Reactions   Aspartame And Phenylalanine Dermatitis      Family History  Problem Relation Age of Onset   Breast cancer Mother    Hypertension Mother    Chronic Renal Failure Father    Stroke Sister 69       smoker   Breast cancer Sister    Diabetes Sister    Hypertension Sister    Heart attack Paternal Grandmother  Diabetes Maternal Aunt    Colon cancer Neg Hx      Social History Ms. Mcgowen reports that she has never smoked. She has never used smokeless tobacco. Ms. Dumais reports no history of alcohol use.   Review of Systems CONSTITUTIONAL: No weight loss, fever, chills, weakness or fatigue.  HEENT: Eyes: No visual loss, blurred vision, double vision or yellow sclerae.No hearing loss, sneezing, congestion, runny nose or sore throat.  SKIN: No rash or itching.  CARDIOVASCULAR: per hpi RESPIRATORY: No shortness of breath, cough or sputum.  GASTROINTESTINAL: No anorexia, nausea, vomiting or diarrhea. No abdominal pain or blood.  GENITOURINARY: No burning on urination, no polyuria NEUROLOGICAL: No headache, dizziness, syncope, paralysis, ataxia, numbness or tingling in the extremities. No change in bowel or bladder control.  MUSCULOSKELETAL: No muscle, back pain, joint pain or stiffness.  LYMPHATICS: No enlarged nodes. No history of splenectomy.  PSYCHIATRIC: No history of depression or anxiety.  ENDOCRINOLOGIC: No reports of sweating, cold or heat intolerance. No polyuria or polydipsia.  Marland Kitchen   Physical Examination Today's Vitals   08/27/21 0841  BP: 122/72  Pulse: 66  SpO2: 98%  Weight: 197 lb (89.4 kg)  Height: 5' 7.5" (1.715 m)   Body mass index is 30.4 kg/m.  Gen: resting comfortably, no acute distress HEENT: no scleral icterus, pupils equal round and reactive, no palptable cervical adenopathy,  CV: RRR, no m/r/g no jvd Resp: Clear to auscultation  bilaterally GI: abdomen is soft, non-tender, non-distended, normal bowel sounds, no hepatosplenomegaly MSK: extremities are warm, no edema.  Skin: warm, no rash Neuro:  no focal deficits Psych: appropriate affect   Diagnostic Studies 03/2018 echo LVEF 20%, diffuse hypokinesis, cannot eval diastolic function, mild RV dysfunction, PASP 47, small pericardial effusion   03/2018 LHC/RHC Prox LAD lesion is 55% stenosed. DFR 0.92 Otherwise angiographically normal coronary arteries. Hemodynamic findings consistent with severe pulmonary hypertension. LV end diastolic pressure is severely elevated.   SUMMARY: Moderate single-vessel disease with roughly 60% mid LAD (DFR 0.92 -not physiologically significant) Otherwise minimal CAD. 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  (Output 4.48, Index 2.2.)     03/2018 cardiac MRI FINDINGS: 1. Moderately dilated left ventricle with mild concentric hypertrophy and severe systolic function (LVEF = 27%). Severe diffuse hypokinesis and paradoxical septal motion. There is no late gadolinium enhancement in the left ventricular myocardium. There is significant hyper trabeculation of the left ventricle with ratio of non-compacted to compacted myocardium > 4:1.   LVEDD: 61 mm   LVESD: 51 mm   LVEDV: 170 ml   LVESV: 124 ml   SV: 46 ml   CO: 4.6 L/min   Myocardial mass: 166 g   2. Normal right ventricular size, thickness and moderate systolic dysfunction (LVEF = 30%). There are no regional wall motion abnormalities.   3.  Normal left and right atrial size.   4. Normal size of the aortic root, ascending aorta and pulmonary artery.   5.  Trivial mitral and mild tricuspid regurgitation.   6. Normal pericardium. Mild circumferential pericardial effusion, moderate amount  inferior to the heart (up to 11 mm).   IMPRESSION: 1. Moderately dilated left ventricle with mild concentric hypertrophy and severe systolic function (LVEF = 27%). Severe diffuse hypokinesis and paradoxical septal motion. There is no late gadolinium enhancement in the left ventricular myocardium. There is significant hyper trabeculation of the left ventricle  with ratio of non-compacted to compacted myocardium > 4:1.   2. Normal right ventricular size, thickness and moderate systolic dysfunction (LVEF = 30%). There are no regional wall motion abnormalities.   3.  Normal left and right atrial size.   4. Normal size of the aortic root, ascending aorta and pulmonary artery.   5.  Trivial mitral and mild tricuspid regurgitation.   6. Normal pericardium. Mild circumferential pericardial effusion, moderate amount inferior to the heart (up to 11 mm).   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     08/2018 echo IMPRESSIONS      1. The left ventricle has severely reduced systolic function, with an ejection fraction of 25-30%. The cavity size was normal. There is mild concentric left ventricular hypertrophy. Left ventricular diastolic Doppler parameters are consistent with  pseudonormalization. Elevated left ventricular end-diastolic pressure Left ventricular diffuse hypokinesis.  2. The left ventricular apex appears trabeculated.  3. The right ventricle has mildly reduced systolic function. The cavity was normal. There is mildly increased right ventricular wall thickness.  4. The mitral valve is grossly normal. Mild thickening of the mitral valve leaflet. Mitral valve regurgitation is mild to moderate by color flow Doppler.  5. The tricuspid valve is grossly normal.  6. The aortic valve is grossly normal. Mild thickening of the aortic valve.  7. The aortic root is normal in size and structure.      01/2019 IMPRESSIONS 1.  Moderate obstructive sleep apnea syndrome worse during REM sleep is noted with this recording.  AutoPap 5-12 as recommended.    Assessment and Plan   1. Chronic systolic HF/Noncompaction cardiomyopathy - NICM, noncompaction CM by MRI - has been on anticoag in setting of noncompaction and low LVEF. She has an ICD - she stopped her home meds, overwhelmed with stress related to separation and moving out of her house in with her home - restart toprol but at 30m daily, restart entresto at lower dose 24/257mbid. Titrate as tolerated. HR 65 today, has not started colranor, hold for now as we titrate beta blocker initially.  - will need to reestablish with Dr BeHaroldine Lawsnce back on prior regimen   2. CAD -no symptoms, continue current meds     3. OSA - she is not ready yet to pursue cpap, discuss again at f/u.    F/u 6 weeks, titrate torpol and entresto.   JoArnoldo LenisM.D.

## 2021-08-27 NOTE — Patient Instructions (Addendum)
Medication Instructions:  Decrease Entresto to 24/'26mg'$  twice a day  Decrease Toprol to '25mg'$  daily  Stop Corlanor (Ivabradine) Continue all other medications.     Labwork: none  Testing/Procedures: none  Follow-Up: 6 weeks  Any Other Special Instructions Will Be Listed Below (If Applicable).   If you need a refill on your cardiac medications before your next appointment, please call your pharmacy.

## 2021-08-28 ENCOUNTER — Other Ambulatory Visit: Payer: Self-pay | Admitting: "Endocrinology

## 2021-08-28 ENCOUNTER — Ambulatory Visit (INDEPENDENT_AMBULATORY_CARE_PROVIDER_SITE_OTHER): Payer: 59

## 2021-08-28 DIAGNOSIS — I5022 Chronic systolic (congestive) heart failure: Secondary | ICD-10-CM

## 2021-08-28 DIAGNOSIS — Z95810 Presence of automatic (implantable) cardiac defibrillator: Secondary | ICD-10-CM

## 2021-08-28 NOTE — Progress Notes (Signed)
EPIC Encounter for ICM Monitoring  Patient Name: Tara Bryant is a 51 y.o. female Date: 08/28/2021 Primary Care Physican: Coral Spikes, DO Primary Cardiologist: Branch/Bensimhon Electrophysiologist: Lovena Le 08/27/2021 Office Weight: 197 lbs (not weighing at home)                                Spoke with patient and heart failure questions reviewed.  Pt asymptomatic for fluid accumulation.   She had OV with Dr Harl Bowie yesterday, 5/22.   HeartLogic Heart Failure Index 11 suggesting fluid levels returned to normal after taking extra Furosemide.  Thoracic impedance improving and trending up.   Prescribed: Furosemide 40 mg Take 60 mg by mouth daily. Can Do additional 20 mg for SOB Spironolactone 25 mg take 0.5 tablet (12.5 mg total) by mouth daily.   Recommendations:  Recommendation to limit salt intake to 2000 mg daily and fluid intake to 64 oz daily.  Encouraged to call if experiencing any fluid symptoms.    Follow-up plan: ICM clinic phone appointment on 09/17/2021.   91 day device clinic remote transmission 11/02/2021.              EP/Cardiology next office visit: Recall 04/12/2022 with Dr Lovena Le.  10/19/2021 with Dr Harl Bowie.             Copy of ICM check sent to Dr. Lovena Le and Dr Harl Bowie as Juluis Rainier.  3 Month Trend    8 Day Data Trend          Rosalene Billings, RN 08/28/2021 8:30 AM

## 2021-09-04 ENCOUNTER — Telehealth: Payer: Self-pay | Admitting: Cardiology

## 2021-09-04 MED ORDER — FUROSEMIDE 40 MG PO TABS
40.0000 mg | ORAL_TABLET | Freq: Every day | ORAL | 3 refills | Status: DC
Start: 2021-09-04 — End: 2022-08-26

## 2021-09-04 MED ORDER — FUROSEMIDE 20 MG PO TABS: 20.0000 mg | ORAL_TABLET | Freq: Every day | ORAL | 3 refills | Status: DC

## 2021-09-04 NOTE — Telephone Encounter (Signed)
*  STAT* If patient is at the pharmacy, call can be transferred to refill team.   1. Which medications need to be refilled? (please list name of each medication and dose if known)  furosemide (LASIX) 40 MG tablet furosemide (LASIX) 20 MG tablet   2. Which pharmacy/location (including street and city if local pharmacy) is medication to be sent to? [id number] - [place], [address] [address]  3. Do they need a 30 day or 90 day supply?  90 day supply  Patient is requesting an additional Furosemide 20 MG for a total of 60 MG daily.

## 2021-09-14 ENCOUNTER — Ambulatory Visit (INDEPENDENT_AMBULATORY_CARE_PROVIDER_SITE_OTHER): Payer: 59 | Admitting: Family Medicine

## 2021-09-14 VITALS — BP 137/82 | HR 97 | Temp 98.1°F | Ht 67.5 in | Wt 199.0 lb

## 2021-09-14 DIAGNOSIS — W57XXXA Bitten or stung by nonvenomous insect and other nonvenomous arthropods, initial encounter: Secondary | ICD-10-CM

## 2021-09-14 DIAGNOSIS — T148XXA Other injury of unspecified body region, initial encounter: Secondary | ICD-10-CM

## 2021-09-14 DIAGNOSIS — S20469A Insect bite (nonvenomous) of unspecified back wall of thorax, initial encounter: Secondary | ICD-10-CM

## 2021-09-14 NOTE — Patient Instructions (Signed)
Keep a close eye on the area.  Call with concerns.  Take care  Dr. Lacinda Axon

## 2021-09-15 DIAGNOSIS — T148XXA Other injury of unspecified body region, initial encounter: Secondary | ICD-10-CM | POA: Insufficient documentation

## 2021-09-15 DIAGNOSIS — S20469A Insect bite (nonvenomous) of unspecified back wall of thorax, initial encounter: Secondary | ICD-10-CM | POA: Insufficient documentation

## 2021-09-15 DIAGNOSIS — W57XXXA Bitten or stung by nonvenomous insect and other nonvenomous arthropods, initial encounter: Secondary | ICD-10-CM | POA: Insufficient documentation

## 2021-09-15 NOTE — Progress Notes (Signed)
Subjective:  [name], female    DOB: 1970/10/21  Age: 51 y.o. MRN: [id number]  CC: Chief Complaint  Patient presents with   multiple tick bites     One 2 weeks ago , and one this week  Places on upper/ mid back and right hip - redness and itch     HPI:  51 year old female presents for evaluation of the above.   Patient report recent tick bite (2 weeks ago and then 1 this week). Most recent bite located in the midline of the lower thoracic spine. Patient states that her daughter attempted to remove tick this week and believe that some of it remained and was not removed. Mild redness. Mild itching. No fever. No other associated symptoms. No other complaints.   Patient Active Problem List   Diagnosis Date Noted   Foreign body in skin 09/15/2021   Tick bite of back wall of thorax 09/15/2021   Cervical radiculopathy 12/03/2019   OSA (obstructive sleep apnea) 73/22/0254   Chronic systolic heart failure ([place]) 02/04/2019   ICD (implantable cardioverter-defibrillator) in place 02/04/2019   Uncontrolled type 2 diabetes mellitus with hyperglycemia ([place]) 06/10/2018   Mixed hyperlipidemia 06/10/2018   Dilated cardiomyopathy ([place]): EF 20%, global hypokinesis. 03/23/2018   Irritable bowel syndrome 09/01/2014   Hypertriglyceridemia, essential 03/08/2014   Venous stasis of lower extremity 12/01/2012   Depression 12/01/2012   Essential hypertension, benign 07/30/2012    Social Hx   Social History   Socioeconomic History   Marital status: Divorced    Spouse name: Not on file   Number of children: 1   Years of education: Not on file   Highest education level: Not on file  Occupational History   Occupation: Home maker  Tobacco Use   Smoking status: Never   Smokeless tobacco: Never  Vaping Use   Vaping Use: Never used  Substance and Sexual Activity   Alcohol use: No   Drug use: No   Sexual activity: Yes    Birth control/protection: Pill  Other Topics Concern    Not on file  Social History Narrative   Not on file   Social Determinants of Health   Financial Resource Strain: Not on file  Food Insecurity: Not on file  Transportation Needs: Not on file  Physical Activity: Not on file  Stress: Not on file  Social Connections: Not on file    Review of Systems Per HPI  Objective:  BP 137/82   Pulse 97   Temp 98.1 F (36.7 C)   Ht 5' 7.5" (1.715 m)   Wt 199 lb (90.3 kg)   SpO2 99%   BMI 30.71 kg/m      09/14/2021    1:42 PM 08/27/2021    8:41 AM 07/10/2021   10:35 AM  BP/Weight  Systolic BP 270 623 762  Diastolic BP 82 72 82  Wt. (Lbs) 199 197 201.2  BMI 30.71 kg/m2 30.4 kg/m2 31.05 kg/m2    Physical Exam Vitals and nursing note reviewed.  Constitutional:      General: She is not in acute distress.    Appearance: Normal appearance. She is not ill-appearing.  HENT:     Head: Normocephalic and atraumatic.  Pulmonary:     Effort: Pulmonary effort is normal. No respiratory distress.  Skin:         Comments: Small area of erythema with eschar. There is a visible dark area which is palpable as well (concern for foreign  body).  Neurological:     Mental Status: She is alert.  Psychiatric:        Mood and Affect: Mood normal.        Behavior: Behavior normal.     Lab Results  Component Value Date   WBC 4.5 05/03/2020   HGB 14.1 05/03/2020   HCT 40.8 05/03/2020   PLT 186 05/03/2020   GLUCOSE 387 (H) 11/10/2020   CHOL 219 (H) 11/10/2020   TRIG 208 (H) 11/10/2020   HDL 32 (L) 11/10/2020   LDLCALC 145 (H) 11/10/2020   ALT 18 11/10/2020   AST 14 (L) 11/10/2020   NA 131 (L) 11/10/2020   K 3.8 11/10/2020   CL 100 11/10/2020   CREATININE 0.73 11/10/2020   BUN 15 11/10/2020   CO2 21 (L) 11/10/2020   TSH 2.585 11/10/2020   INR 1.1 12/04/2019   HGBA1C 9.7 (A) 07/10/2021   MICROALBUR 21.8 (H) 05/12/2020   Procedure:  Removal of foreign body Area was cleansed with alcohol and betadine. Local anesthesia with 1%  Lidocaine. Forceps and Scapel (#11) used to remove small black foreign body. No incision was made (just blunt probing). No significant blood loss. Patient tolerated the procedure without difficulty.   Assessment & Plan:   Problem List Items Addressed This Visit       Musculoskeletal and Integument   Foreign body in skin - Primary    Small foreign body removed. See procedure.       Tick bite of back wall of thorax    No evidence of infection. Advised to keep clean.      [place]

## 2021-09-15 NOTE — Assessment & Plan Note (Signed)
Small foreign body removed. See procedure.

## 2021-09-15 NOTE — Assessment & Plan Note (Signed)
No evidence of infection. Advised to keep clean.

## 2021-09-17 ENCOUNTER — Ambulatory Visit (INDEPENDENT_AMBULATORY_CARE_PROVIDER_SITE_OTHER): Payer: 59

## 2021-09-17 DIAGNOSIS — I5022 Chronic systolic (congestive) heart failure: Secondary | ICD-10-CM | POA: Diagnosis not present

## 2021-09-17 DIAGNOSIS — Z95810 Presence of automatic (implantable) cardiac defibrillator: Secondary | ICD-10-CM

## 2021-09-19 ENCOUNTER — Telehealth: Payer: Self-pay

## 2021-09-19 ENCOUNTER — Encounter: Payer: Self-pay | Admitting: Family Medicine

## 2021-09-19 ENCOUNTER — Ambulatory Visit (INDEPENDENT_AMBULATORY_CARE_PROVIDER_SITE_OTHER): Payer: 59 | Admitting: Family Medicine

## 2021-09-19 VITALS — BP 134/76 | HR 91 | Temp 97.9°F | Wt 198.6 lb

## 2021-09-19 DIAGNOSIS — I5022 Chronic systolic (congestive) heart failure: Secondary | ICD-10-CM | POA: Diagnosis not present

## 2021-09-19 DIAGNOSIS — E1165 Type 2 diabetes mellitus with hyperglycemia: Secondary | ICD-10-CM

## 2021-09-19 DIAGNOSIS — F419 Anxiety disorder, unspecified: Secondary | ICD-10-CM

## 2021-09-19 DIAGNOSIS — I2510 Atherosclerotic heart disease of native coronary artery without angina pectoris: Secondary | ICD-10-CM | POA: Diagnosis not present

## 2021-09-19 DIAGNOSIS — E782 Mixed hyperlipidemia: Secondary | ICD-10-CM

## 2021-09-19 DIAGNOSIS — I10 Essential (primary) hypertension: Secondary | ICD-10-CM

## 2021-09-19 DIAGNOSIS — F32A Depression, unspecified: Secondary | ICD-10-CM | POA: Insufficient documentation

## 2021-09-19 MED ORDER — SERTRALINE HCL 50 MG PO TABS: 50.0000 mg | ORAL_TABLET | Freq: Every day | ORAL | 1 refills | Status: DC

## 2021-09-19 NOTE — Assessment & Plan Note (Signed)
Continue close follow-up with endocrinology.  Continue current medications.

## 2021-09-19 NOTE — Assessment & Plan Note (Signed)
Planning on lipid panel at follow-up.  Continue Lipitor.  Will need dose increase if lipids are not at goal.

## 2021-09-19 NOTE — Progress Notes (Signed)
Attempted return call to patient and unable to reach.  .  

## 2021-09-19 NOTE — Progress Notes (Signed)
EPIC Encounter for ICM Monitoring  Patient Name: Tara Bryant is a 51 y.o. female Date: 09/19/2021 Primary Care Physican: Coral Spikes, DO Primary Cardiologist: Branch/Bensimhon Electrophysiologist: Lovena Le 08/27/2021 Office Weight: 197 lbs (not weighing at home)                                Attempted call to patient and unable to reach.  Left message to return call. Transmission reviewed.    HeartLogic Heart Failure Index rising from 0 to 6 overnight and thoracic impedance trending down suggesting the possibility fluid may be starting to accumulate.   Prescribed: Furosemide 40 mg Take 60 mg by mouth daily. Can Do additional 20 mg for SOB Spironolactone 25 mg take 0.5 tablet (12.5 mg total) by mouth daily.   Recommendations:  Unable to reach.      Follow-up plan: ICM clinic phone appointment on 10/01/2021 to recheck fluid levels.   91 day device clinic remote transmission 11/02/2021.              EP/Cardiology next office visit: Recall 04/12/2022 with Dr Lovena Le.  10/19/2021 with Dr Harl Bowie.             Copy of ICM check sent to Dr. Lovena Le  3 Month Trend    [place], RN 09/19/2021 8:10 AM

## 2021-09-19 NOTE — Progress Notes (Signed)
Spoke with patient and heart failure questions reviewed.  Pt asymptomatic for fluid accumulation.   Transmission reviewed and explained the report suggesting she is starting to retain fluid on Monday.  She ate restaurant foods over the weekend and missed taking Furosemide dosage yesterday.   Advised to return to low salt diet and not to skip Furosemide dosages. She verbalized understanding.  Will recheck fluid levels o6/26.

## 2021-09-19 NOTE — Assessment & Plan Note (Signed)
Advised close follow-up.   I agree with recommendation to start SGLT2.

## 2021-09-19 NOTE — Assessment & Plan Note (Signed)
Referral placed for counseling/therapy.  Starting on Zoloft.

## 2021-09-19 NOTE — Telephone Encounter (Signed)
Remote ICM transmission received.  Attempted call to patient regarding ICM remote transmission and left message to return call   

## 2021-09-19 NOTE — Patient Instructions (Signed)
Medication as prescribed.  Follow up in 3 months.  Take care  Dr. Pixie Burgener 

## 2021-09-19 NOTE — Assessment & Plan Note (Signed)
Stable.  Continue current medications.

## 2021-09-19 NOTE — Progress Notes (Signed)
Subjective:  [name], female    DOB: [birth year]  Age: 51 y.o. MRN: [id number]  CC: Chief Complaint  Patient presents with   Follow-up    Pt seen Tara Bryant (endo) previously and was told she may have anxiety. Pt states she has mentioned this to previous doctors. Pt states she has had major depression-along with stress from finances, relationships, etc.     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  CAD/CHF ICD in place. Follows with cardiology.  Needs to get back in with Dr. Haroldine Laws. Patient did not start Wilder Glade due to concerns regarding yeast vaginitis as well as concerns given by her endocrinologist. Patient states that she seems to be stable.  No significant shortness of breath.  No current chest pain. She recently stopped her medications but has now restarted.  She is back on spironolactone, Entresto, and metoprolol.  Type 2 diabetes Followed by Endo.  Uncontrolled. She is on Novolin 70/30, metformin, and glipizide.  I think she would benefit from addition of SGLT2. Needs foot exam.  Patient reports that she is concerned about her circulation.  Hypertension Stable on current medications.  Hyperlipidemia Despite statin therapy has been not at goal.  She is currently on atorvastatin 10 mg daily.  Will need labs in the near future to reassess.  Will likely need increase in dosing.  Anxiety Patient reports that she has been told previously that she likely suffers from anxiety.  She has had issues with depression in the past as well.  Patient reports that she has been through a lot particular over the past year.  She has been evicted from her home and has had to move back in with her mother.  She has  gone through divorce.  She is suffering from multiple medical issues.  She states that she recently was in a relationship and this ended.   Patient Active Problem List   Diagnosis Date Noted   CAD (coronary artery disease) 09/19/2021   Anxiety and depression 09/19/2021   Cervical radiculopathy 12/03/2019   OSA (obstructive sleep apnea) 25/36/6440   Chronic systolic heart failure ([place]) 02/04/2019   ICD (implantable cardioverter-defibrillator) in place 02/04/2019   Uncontrolled type 2 diabetes mellitus with hyperglycemia ([place]) 06/10/2018   Mixed hyperlipidemia 06/10/2018   Essential hypertension, benign 07/30/2012    Social Hx   Social History   Socioeconomic History   Marital status: Divorced    Spouse name: Not on file   Number of children: 1   Years of education: Not on file   Highest education level: Not on file  Occupational History   Occupation: Home maker  Tobacco Use   Smoking status: Never   Smokeless tobacco: Never  Vaping Use   Vaping Use: Never used  Substance and Sexual Activity   Alcohol use: No   Drug use: No   Sexual activity: Yes    Birth control/protection: Pill  Other Topics Concern   Not on file  Social History Narrative   Not on file   Social Determinants of Health   Financial Resource Strain: Not on file  Food Insecurity: Not on file  Transportation Needs: Not on file  Physical Activity: Not on file  Stress: Not on file  Social Connections: Not on file    Review of Systems Per HPI  Objective:  BP 134/76   Pulse 91   Temp 97.9 F (36.6 C)   Wt 198 lb 9.6 oz (90.1 kg)   SpO2 99%   BMI 30.65 kg/m      09/19/2021    9:22 AM 09/14/2021    1:42 PM 08/27/2021    8:41 AM  BP/Weight  Systolic BP 389 373 428  Diastolic BP 76 82 72  Wt. (Lbs) 198.6 199 197  BMI 30.65 kg/m2 30.71 kg/m2 30.4 kg/m2    Physical Exam  Lab Results  Component Value Date   WBC 4.5 05/03/2020   HGB 14.1 05/03/2020   HCT 40.8 05/03/2020   PLT 186  05/03/2020   GLUCOSE 387 (H) 11/10/2020   CHOL 219 (H) 11/10/2020   TRIG 208 (H) 11/10/2020   HDL 32 (L) 11/10/2020   LDLCALC 145 (H) 11/10/2020   ALT 18 11/10/2020   AST 14 (L) 11/10/2020   NA 131 (L) 11/10/2020   K 3.8 11/10/2020   CL 100 11/10/2020   CREATININE 0.73 11/10/2020   BUN 15 11/10/2020   CO2 21 (L) 11/10/2020   TSH 2.585 11/10/2020   INR 1.1 12/04/2019   HGBA1C 9.7 (A) 07/10/2021   MICROALBUR 21.8 (H) 05/12/2020     Assessment & Plan:   Problem List Items Addressed This Visit       Cardiovascular and Mediastinum   Essential hypertension, benign (Chronic)    Stable.  Continue current medications.      CAD (coronary artery disease)   Chronic systolic heart failure ([place]) - Primary    Advised close follow-up.   I agree with recommendation to start SGLT2.        Endocrine   Uncontrolled type 2 diabetes mellitus with hyperglycemia ([place])    Continue close follow-up with endocrinology.  Continue current medications.        Other   Anxiety and depression    Referral placed for counseling/therapy.  Starting on Zoloft.      Relevant Medications   sertraline (ZOLOFT) 50 MG tablet   Other Relevant Orders   Ambulatory referral to [facility]   Mixed hyperlipidemia    Planning on lipid panel at follow-up.  Continue Lipitor.  Will need dose increase if lipids are not at goal.       Meds ordered this encounter  Medications   sertraline (ZOLOFT) 50 MG tablet    Sig: Take 1 tablet (50 mg total) by mouth daily.    Dispense:  90 tablet    Refill:  1    Follow-up:  Return in about 3 months (around 12/20/2021).  [place]

## 2021-10-01 ENCOUNTER — Ambulatory Visit (INDEPENDENT_AMBULATORY_CARE_PROVIDER_SITE_OTHER): Payer: 59

## 2021-10-01 DIAGNOSIS — Z95810 Presence of automatic (implantable) cardiac defibrillator: Secondary | ICD-10-CM

## 2021-10-01 DIAGNOSIS — I5022 Chronic systolic (congestive) heart failure: Secondary | ICD-10-CM

## 2021-10-02 ENCOUNTER — Other Ambulatory Visit (HOSPITAL_COMMUNITY)
Admission: RE | Admit: 2021-10-02 | Discharge: 2021-10-02 | Disposition: A | Discharge status: Home / Self Care | Payer: 59 | Attending: Nurse Practitioner | Admitting: Nurse Practitioner

## 2021-10-02 DIAGNOSIS — E559 Vitamin D deficiency, unspecified: Secondary | ICD-10-CM | POA: Insufficient documentation

## 2021-10-02 DIAGNOSIS — E1165 Type 2 diabetes mellitus with hyperglycemia: Secondary | ICD-10-CM | POA: Diagnosis present

## 2021-10-02 LAB — COMPREHENSIVE METABOLIC PANEL
ALT: 25 U/L (ref 0–44)
AST: 20 U/L (ref 15–41)
Albumin: 3.9 g/dL (ref 3.5–5.0)
Alkaline Phosphatase: 63 U/L (ref 38–126)
Anion gap: 12 (ref 5–15)
BUN: 15 mg/dL (ref 6–20)
CO2: 23 mmol/L (ref 22–32)
Calcium: 8.7 mg/dL — ABNORMAL LOW (ref 8.9–10.3)
Chloride: 100 mmol/L (ref 98–111)
Creatinine, Ser: 0.99 mg/dL (ref 0.44–1.00)
GFR, Estimated: >60 mL/min (ref >60)
Glucose, Bld: 395 mg/dL — ABNORMAL HIGH (ref 70–99)
Potassium: 3.5 mmol/L (ref 3.5–5.1)
Sodium: 135 mmol/L (ref 135–145)
Total Bilirubin: 0.8 mg/dL (ref 0.3–1.2)
Total Protein: 7.3 g/dL (ref 6.5–8.1)

## 2021-10-02 LAB — LIPID PANEL
Cholesterol: 207 mg/dL — ABNORMAL HIGH (ref 0–200)
HDL: 39 mg/dL — ABNORMAL LOW (ref >40)
LDL Cholesterol: 138 mg/dL — ABNORMAL HIGH (ref 0–99)
Total CHOL/HDL Ratio: 5.3 RATIO
Triglycerides: 148 mg/dL (ref <150)
VLDL: 30 mg/dL (ref 0–40)

## 2021-10-02 LAB — TSH: TSH: 1.652 u[IU]/mL (ref 0.350–4.500)

## 2021-10-02 LAB — VITAMIN D 25 HYDROXY (VIT D DEFICIENCY, FRACTURES): Vit D, 25-Hydroxy: 32.58 ng/mL (ref 30–100)

## 2021-10-02 LAB — T4, FREE: Free T4: 1.06 ng/dL (ref 0.61–1.12)

## 2021-10-15 ENCOUNTER — Ambulatory Visit (INDEPENDENT_AMBULATORY_CARE_PROVIDER_SITE_OTHER): Payer: 59 | Admitting: Nurse Practitioner

## 2021-10-15 ENCOUNTER — Encounter: Payer: Self-pay | Admitting: Nurse Practitioner

## 2021-10-15 VITALS — BP 118/81 | HR 89 | Ht 67.5 in | Wt 190.0 lb

## 2021-10-15 DIAGNOSIS — E782 Mixed hyperlipidemia: Secondary | ICD-10-CM | POA: Diagnosis not present

## 2021-10-15 DIAGNOSIS — I10 Essential (primary) hypertension: Secondary | ICD-10-CM

## 2021-10-15 DIAGNOSIS — E1165 Type 2 diabetes mellitus with hyperglycemia: Secondary | ICD-10-CM | POA: Diagnosis not present

## 2021-10-15 DIAGNOSIS — E559 Vitamin D deficiency, unspecified: Secondary | ICD-10-CM

## 2021-10-15 LAB — POCT GLYCOSYLATED HEMOGLOBIN (HGB A1C): HbA1c POC (<> result, manual entry): 9.9 % (ref 4.0–5.6)

## 2021-10-15 MED ORDER — METFORMIN HCL 500 MG PO TABS: 500.0000 mg | ORAL_TABLET | Freq: Two times a day (BID) | ORAL | 3 refills | Status: DC

## 2021-10-15 MED ORDER — DEXCOM G7 SENSOR MISC: 1.0000 | 3 refills | Status: DC

## 2021-10-15 MED ORDER — NOVOLIN 70/30 FLEXPEN (70-30) 100 UNIT/ML ~~LOC~~ SUPN: PEN_INJECTOR | SUBCUTANEOUS | 3 refills | Status: DC

## 2021-10-15 MED ORDER — GLIPIZIDE 5 MG PO TABS: 5.0000 mg | ORAL_TABLET | Freq: Two times a day (BID) | ORAL | 3 refills | Status: DC

## 2021-10-15 MED ORDER — DEXCOM G7 RECEIVER DEVI: 1.0000 | Freq: Once | 0 refills | Status: AC

## 2021-10-15 NOTE — Progress Notes (Signed)
10/15/2021, 11:16 AM  Endocrinology follow-up note                              Subjective:    [name], female    DOB: Oct 18, 1970.  Tara Bryant is being seen in follow-up in  management of currently uncontrolled symptomatic diabetes requested by  Coral Spikes, DO.   Past Medical History:  Diagnosis Date   AICD (automatic cardioverter/defibrillator) present    Arrhythmia    Arthritis    CAD (coronary artery disease)    a. 03/2018: cath showing 60% mid-LAD stenosis not significant by FFR.    Chronic combined systolic (congestive) and diastolic (congestive) heart failure ([place])    a. 03/2018: found to have a newly reduced EF of 20% by echocardiogram   Depression    denies   Diabetes mellitus    GERD (gastroesophageal reflux disease)    h/o of   Hypertension    Microproteinuria    S/P thyroid biopsy 2020   follow up once a year,    Sleep apnea    Past Surgical History:  Procedure Laterality Date   ANTERIOR CERVICAL DECOMP/DISCECTOMY FUSION N/A 12/03/2019   Procedure: ANTERIOR CERVICAL DECOMPRESSION/DISCECTOMY FUSION CERVICAL SIX- CERVICAL SEVEN;  Surgeon: Consuella Lose, MD;  Location: [facility];  Service: Neurosurgery;  Laterality: N/A;  ANTERIOR CERVICAL DECOMPRESSION/DISCECTOMY FUSION CERVICAL SIX- CERVICAL SEVEN   BACK SURGERY  2010   BREAST BIOPSY Left 2017   benign   CARDIAC CATHETERIZATION     ICD IMPLANT N/A 10/29/2018   Procedure: ICD IMPLANT;  Surgeon: Evans Lance, MD;  Location: [facility];  Service: Cardiovascular;  Laterality: N/A;   INTRAVASCULAR PRESSURE WIRE/FFR STUDY N/A 03/24/2018   Procedure: INTRAVASCULAR PRESSURE WIRE/FFR STUDY;  Surgeon: Leonie Man, MD;  Location: [facility];  Service: Cardiovascular;  Laterality: N/A;   NOSE SURGERY     RIGHT/LEFT HEART CATH AND CORONARY ANGIOGRAPHY N/A 03/24/2018   Procedure: RIGHT/LEFT HEART  CATH AND CORONARY ANGIOGRAPHY;  Surgeon: Leonie Man, MD;  Location: [facility];  Service: Cardiovascular;  Laterality: N/A;   TONSILLECTOMY     TOOTH EXTRACTION     September 2022   WISDOM TOOTH EXTRACTION     Social History   Socioeconomic History   Marital status: Divorced    Spouse name: Not on file   Number of children: 1   Years of education: Not on file   Highest education level: Not on file  Occupational History   Occupation: Home maker  Tobacco Use   Smoking status: Never   Smokeless tobacco: Never  Vaping Use   Vaping Use: Never used  Substance and Sexual Activity   Alcohol use: No   Drug use: No   Sexual activity: Yes    Birth control/protection: Pill  Other Topics Concern   Not on file  Social History Narrative   Not on file   Social Determinants of Health   Financial Resource Strain: Not on file  Food Insecurity: Not on file  Transportation Needs: Not on file  Physical Activity: Not on file  Stress: Not on file  Social Connections: Not on file   Outpatient Encounter Medications as of 10/15/2021  Medication Sig   Continuous Blood Gluc Receiver (DEXCOM G7 RECEIVER) DEVI 1 Device by Does not apply route once for 1 dose.   Continuous Blood Gluc Sensor (DEXCOM G7 SENSOR) MISC Inject 1 Application into the skin as directed. Change sensor every 10 days as directed.   apixaban (ELIQUIS) 5 MG TABS tablet Take 1 tablet (5 mg total) by mouth 2 (two) times daily.   atorvastatin (LIPITOR) 10 MG tablet Take 1 tablet (10 mg total) by mouth daily.   B-D ULTRAFINE III SHORT PEN 31G X 8 MM MISC USE TO INJECT INSULIN TWICE DAILY   benzonatate (TESSALON) 100 MG capsule Take 1 capsule (100 mg total) by mouth 2 (two) times daily as needed for cough.   Blood Glucose Monitoring Suppl (CONTOUR NEXT MONITOR) w/Device KIT Use to monitor glucose 3 times daily as instructed   furosemide (LASIX) 20 MG tablet Take 1 tablet (20 mg total) by mouth daily. With 40 mg tablet to  equal 60 mg daily. Can Do additional 20 mg daily for SOB as needed   furosemide (LASIX) 40 MG tablet Take 1 tablet (40 mg total) by mouth daily. With 20 mg tablet to equal 60 mg daily. Can Do additional 20 mg daily for SOB as needed   glipiZIDE (GLUCOTROL) 5 MG tablet Take 1 tablet (5 mg total) by mouth 2 (two) times daily before a meal.   glucose blood (CONTOUR TEST) test strip Use as instructed to monitor glucose 3 times daily as instructed   insulin isophane & regular human KwikPen (NOVOLIN 70/30 KWIKPEN) (70-30) 100 UNIT/ML KwikPen 75 units with breakfast and 90 units with supper if glucose is above 90 and she is eating   Lancets (ONETOUCH DELICA PLUS [id number]) MISC USE TO TEST BLOOD SUGAR FOUR TIMES DAILY   metFORMIN (GLUCOPHAGE) 500 MG tablet Take 1 tablet (500 mg total) by mouth 2 (two) times daily with a meal.   metoprolol (TOPROL-XL) 25 MG 24 hr tablet Take 1 tablet (25 mg total) by mouth daily.   NORLYDA 0.35 MG tablet Take 1 tablet by mouth at bedtime.    sacubitril-valsartan (ENTRESTO) 24-26 MG Take 1 tablet by mouth 2 (two) times daily.   sertraline (ZOLOFT) 50 MG tablet Take 1 tablet (50 mg total) by mouth daily.   spironolactone (ALDACTONE) 25 MG tablet Take 12.5 mg by mouth daily.   [DISCONTINUED] glipiZIDE (GLUCOTROL) 5 MG tablet Take 1 tablet (5 mg total) by mouth 2 (two) times daily before a meal.   [DISCONTINUED] insulin isophane & regular human KwikPen (NOVOLIN 70/30 KWIKPEN) (70-30) 100 UNIT/ML KwikPen 75 units with breakfast and 90 units with supper if glucose is above 90 and she is eating   [DISCONTINUED] metFORMIN (GLUCOPHAGE) 500 MG tablet Take 1 tablet (500 mg total) by mouth 2 (two) times daily with a meal.   No facility-administered encounter medications on file as of 10/15/2021.    ALLERGIES: Allergies  Allergen Reactions   Aspartame And Phenylalanine Dermatitis    VACCINATION STATUS: Immunization History  Administered Date(s) Administered   Influenza Split  02/02/2013   Influenza,inj,Quad PF,6+ Mos 01/11/2014, 12/15/2014, 12/15/2015, 01/03/2017, 01/28/2018, 02/03/2019, 02/17/2020, 02/16/2021   Influenza-Unspecified 03/08/2011   Pneumococcal Polysaccharide-23 03/30/2009   Td 02/04/2006   Tdap 02/26/2018    Diabetes She presents for her follow-up diabetic visit. She has type 2 diabetes mellitus. Onset time:  She was diagnosed at approximate age of 29 years. Her disease course has been fluctuating. There are no hypoglycemic associated symptoms. Pertinent negatives for hypoglycemia include no confusion, headaches, pallor or seizures. Associated symptoms include fatigue, visual change and weight loss. Pertinent negatives for diabetes include no chest pain, no polydipsia, no polyphagia and no polyuria. (Vaginal yeast infection) There are no hypoglycemic complications. Symptoms are stable. Diabetic complications include heart disease. Risk factors for coronary artery disease include diabetes mellitus, dyslipidemia, hypertension, family history, sedentary lifestyle and stress. Current diabetic treatment includes insulin injections and oral agent (dual therapy). She is compliant with treatment some of the time (often missed her second dose of Metformin and Glipizide). Her weight is decreasing steadily. She is following a generally unhealthy diet. When asked about meal planning, she reported none. She has had a previous visit with a dietitian. She never participates in exercise. Her home blood glucose trend is increasing steadily. Her breakfast blood glucose range is generally >200 mg/dl. Her dinner blood glucose range is generally >200 mg/dl. Her overall blood glucose range is >200 mg/dl. (She presents today with her meter and logs showing significant hyperglycemia overall.  Her POCT A1c today is 9.9%, increasing from last visit of 9.7%.  Analysis of her meters shows 7-day average of 316, 14-day average of 318, 30-day average of 306, 90-day average of 264.  She admits  she continues to forget to take her second dose of Metformin and Glipizide and has not started the Farxiga as recommended by her cardiologist.  She recent got bad news that her long time boyfriend has been cheating on her and she notes she has been stressed to the max.) An ACE inhibitor/angiotensin II receptor blocker is being taken. She does not see a podiatrist.Eye exam is current.  Hyperlipidemia This is a chronic problem. The current episode started more than 1 year ago. The problem is uncontrolled. Recent lipid tests were reviewed and are high. Exacerbating diseases include diabetes. Factors aggravating her hyperlipidemia include beta blockers and fatty foods. Pertinent negatives include no chest pain, myalgias or shortness of breath. Current antihyperlipidemic treatment includes statins. Compliance problems include adherence to diet and adherence to exercise.  Risk factors for coronary artery disease include dyslipidemia, diabetes mellitus, hypertension, a sedentary lifestyle and family history.  Hypertension This is a chronic problem. The current episode started more than 1 year ago. The problem has been gradually improving since onset. The problem is controlled. Pertinent negatives include no chest pain, headaches, palpitations or shortness of breath. There are no associated agents to hypertension. Risk factors for coronary artery disease include diabetes mellitus, dyslipidemia, family history and sedentary lifestyle. Past treatments include beta blockers, diuretics and angiotensin blockers. The current treatment provides significant improvement. There are no compliance problems.  Hypertensive end-organ damage includes CAD/MI and heart failure.     Review of systems  Constitutional: + steadily decreasing body weight,  current Body mass index is 29.32 kg/m. , no fatigue, no subjective hyperthermia, no subjective hypothermia Eyes: no blurry vision, no xerophthalmia ENT: no sore throat, no nodules  palpated in throat, no dysphagia/odynophagia, no hoarseness Cardiovascular: no chest pain, some intermittent SOB r/t CHF, no palpitations, no leg swelling Respiratory: no cough, some intermittent shortness of breath r/t CHF Gastrointestinal: no nausea/vomiting/diarrhea Genitourinary: + vaginal itching- being treated for vaginal yeast by OBGYN Musculoskeletal: no muscle/joint aches Skin: no rashes, no hyperemia,  Neurological: no tremors, no numbness, no tingling, no dizziness Psychiatric: + depression, + intermittent anxiety, has had great amount   of stress, recently started on Zoloft by PCP    Objective:    BP 118/81   Pulse 89   Ht 5' 7.5" (1.715 m)   Wt 190 lb (86.2 kg)   BMI 29.32 kg/m   Wt Readings from Last 3 Encounters:  10/15/21 190 lb (86.2 kg)  09/19/21 198 lb 9.6 oz (90.1 kg)  09/14/21 199 lb (90.3 kg)    BP Readings from Last 3 Encounters:  10/15/21 118/81  09/19/21 134/76  09/14/21 137/82     Physical Exam- Limited  Constitutional:  Body mass index is 29.32 kg/m. , not in acute distress, anxious, depressed state of mind, avoids eye contact Eyes:  EOMI, no exophthalmos Neck: Supple Musculoskeletal: no gross deformities, strength intact in all four extremities, no gross restriction of joint movements Skin:  no rashes, no hyperemia Neurological: no tremor with outstretched hands    CMP ( most recent) CMP     Component Value Date/Time   NA 135 10/02/2021 1034   NA 138 04/03/2018 1240   K 3.5 10/02/2021 1034   CL 100 10/02/2021 1034   CO2 23 10/02/2021 1034   GLUCOSE 395 (H) 10/02/2021 1034   BUN 15 10/02/2021 1034   BUN 20 04/03/2018 1240   CREATININE 0.99 10/02/2021 1034   CREATININE 0.74 04/06/2014 0934   CALCIUM 8.7 (L) 10/02/2021 1034   PROT 7.3 10/02/2021 1034   PROT 6.5 03/26/2017 0916   ALBUMIN 3.9 10/02/2021 1034   ALBUMIN 4.0 03/26/2017 0916   AST 20 10/02/2021 1034   ALT 25 10/02/2021 1034   ALKPHOS 63 10/02/2021 1034   BILITOT 0.8  10/02/2021 1034   BILITOT 0.4 03/26/2017 0916   GFRNONAA >60 10/02/2021 1034   GFRAA >60 01/06/2020 1053     Diabetic Labs (most recent): Lab Results  Component Value Date   HGBA1C 9.9 10/15/2021   HGBA1C 9.7 (A) 07/10/2021   HGBA1C 8.3 (A) 03/14/2021   MICROALBUR 21.8 (H) 05/12/2020   MICROALBUR 33.1 (H) 09/29/2018   MICROALBUR 4.3 (H) 04/06/2014     Lipid Panel ( most recent) Lipid Panel     Component Value Date/Time   CHOL 207 (H) 10/02/2021 1034   CHOL 202 (H) 03/26/2017 0916   TRIG 148 10/02/2021 1034   HDL 39 (L) 10/02/2021 1034   HDL 45 03/26/2017 0916   CHOLHDL 5.3 10/02/2021 1034   VLDL 30 10/02/2021 1034   LDLCALC 138 (H) 10/02/2021 1034   LDLCALC 134 (H) 03/26/2017 0916      Assessment & Plan:   1) Uncontrolled type 2 diabetes mellitus with hyperglycemia (HCC)  - Cherith S Brereton has currently uncontrolled symptomatic type 2 DM since 51 years of age.  She presents today with her meter and logs showing significant hyperglycemia overall.  Her POCT A1c today is 9.9%, increasing from last visit of 9.7%.  Analysis of her meters shows 7-day average of 316, 14-day average of 318, 30-day average of 306, 90-day average of 264.  She admits she continues to forget to take her second dose of Metformin and Glipizide and has not started the Farxiga as recommended by her cardiologist.  She recent got bad news that her long time boyfriend has been cheating on her and she notes she has been stressed to the max.  her diabetes is complicated by coronary artery disease/CHF and she remains at a high risk for more acute and chronic complications which include CAD, CVA, CKD, retinopathy, and neuropathy. These are all discussed in detail with   her.  - Nutritional counseling repeated at each appointment due to patients tendency to fall back in to old habits.  - The patient admits there is a room for improvement in their diet and drink choices. -  Suggestion is made for the patient to  avoid simple carbohydrates from their diet including Cakes, Sweet Desserts / Pastries, Ice Cream, Soda (diet and regular), Sweet Tea, Candies, Chips, Cookies, Sweet Pastries, Store Bought Juices, Alcohol in Excess of 1-2 drinks a day, Artificial Sweeteners, Coffee Creamer, and "Sugar-free" Products. This will help patient to have stable blood glucose profile and potentially avoid unintended weight gain.   - I encouraged the patient to switch to unprocessed or minimally processed complex starch and increased protein intake (animal or plant source), fruits, and vegetables.   - Patient is advised to stick to a routine mealtimes to eat 3 meals a day and avoid unnecessary snacks (to snack only to correct hypoglycemia).  - I have approached her with the following individualized plan to manage diabetes and patient agrees:   -She has been more consistent with taking her insulin and it has helped tremendously.  -She is advised to continue current regimen of Novolog 70/30 75 units with breakfast and 90 units with supper if glucose is above 90 and she is eatiing.  She is advised to be more consistent with taking her second dose of Metformin (1000 mg po twice daily with meals) and Glipizide (5 mg po twice daily with meals).  I did encourage her to start the Farxiga as recommended by her cardiologist now that her yeast infection is being treated.  -She is urged to continue monitoring blood glucose at least three times per day, before injecting insulin (at breakfast and supper) and before bed.  She is encouraged to call the clinic if she has readings less than 70 or greater than 300 for 3 tests in a row.  I did send in for CGM Dexcom G7 to her pharmacy.  She notes her insurance should be changing in the next month or so.  - Patient specific target  A1c; LDL, HDL, Triglycerides, were discussed in detail.  2) Blood Pressure /Hypertension: Her blood pressure is controlled to target.  She is advised to continue Lasix  60 mg po daily, Metoprolol 200 mg po daily, and Spironolactone 12.5 mg daily.  She sees Dr. Bensimhon for CHF.  3) Lipids/Hyperlipidemia:  Her most recent lipid panel from 10/02/21 shows uncontrolled LDL of 138.  She is advised to continue Lipitor 10 mg po daily at bedtime.  Side effects and precautions discussed with her.    4)  Weight/Diet:  Her Body mass index is 29.32 kg/m.-she is a candidate for mild weight loss.  I discussed with her the fact that loss of 5 - 10% of her  current body weight will have the most impact on her diabetes management.  CDE Consult will be initiated . Exercise, and detailed carbohydrates information provided  -  detailed on discharge instructions.  5) Chronic Care/Health Maintenance: -she is on ACEI/ARB medications and statin medications and is encouraged to initiate and continue to follow up with Ophthalmology, Dentist,  Podiatrist at least yearly or according to recommendations, and advised to  stay away from smoking. I have recommended yearly flu vaccine and pneumonia vaccine at least every 5 years; moderate intensity exercise for up to 150 minutes weekly; and  sleep for at least 7 hours a day.  - she is advised to maintain close follow up with   Cook, Jayce G, DO for primary care needs, as well as her other providers for optimal and coordinated care.      I spent 40 minutes in the care of the patient today including review of labs from CMP, Lipids, Thyroid Function, Hematology (current and previous including abstractions from other facilities); face-to-face time discussing  her blood glucose readings/logs, discussing hypoglycemia and hyperglycemia episodes and symptoms, medications doses, her options of short and long term treatment based on the latest standards of care / guidelines;  discussion about incorporating lifestyle medicine;  and documenting the encounter. Risk reduction counseling performed per USPSTF guidelines to reduce obesity and cardiovascular risk  factors.     Please refer to Patient Instructions for Blood Glucose Monitoring and Insulin/Medications Dosing Guide"  in media tab for additional information. Please  also refer to " Patient Self Inventory" in the Media  tab for reviewed elements of pertinent patient history.  Hellena S Tarquinio participated in the discussions, expressed understanding, and voiced agreement with the above plans.  All questions were answered to her satisfaction. she is encouraged to contact clinic should she have any questions or concerns prior to her return visit.    Follow up plan: - Return in about 4 months (around 02/15/2022) for Diabetes F/U with A1c in office, No previsit labs, Bring meter and logs.  [name], FNP-BC [facility] [address] , [place] [address] Phone: [phone] Fax: [phone]  10/15/2021, 11:16 AM    

## 2021-10-16 ENCOUNTER — Ambulatory Visit: Payer: 59 | Admitting: Cardiology

## 2021-10-16 ENCOUNTER — Encounter: Payer: Self-pay | Admitting: Cardiology

## 2021-10-16 VITALS — BP 128/88 | HR 82 | Ht 67.5 in | Wt 193.8 lb

## 2021-10-16 DIAGNOSIS — I2510 Atherosclerotic heart disease of native coronary artery without angina pectoris: Secondary | ICD-10-CM | POA: Diagnosis not present

## 2021-10-16 DIAGNOSIS — I5022 Chronic systolic (congestive) heart failure: Secondary | ICD-10-CM

## 2021-10-16 MED ORDER — SACUBITRIL-VALSARTAN 49-51 MG PO TABS: 1.0000 | ORAL_TABLET | Freq: Two times a day (BID) | ORAL | 6 refills | Status: DC

## 2021-10-16 MED ORDER — METOPROLOL SUCCINATE ER 50 MG PO TB24: 50.0000 mg | ORAL_TABLET | Freq: Every day | ORAL | 6 refills | Status: DC

## 2021-10-16 NOTE — Patient Instructions (Signed)
Medication Instructions:  Increase Toprol XL to '50mg'$  daily Increase Entresto to 49/'51mg'$  twice a day   Continue all other medications.     Labwork: none  Testing/Procedures: none  Follow-Up: 6  weeks  Any Other Special Instructions Will Be Listed Below (If Applicable).   If you need a refill on your cardiac medications before your next appointment, please call your pharmacy.

## 2021-10-16 NOTE — Progress Notes (Signed)
Clinical Summary Ms. Lantry is a 51 y.o.female seen today for follow up of the following medical problems.        1. 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    - 08/2018 echo: LVEF 25-30%, grade II diastolic dysfunction, mild RV dysfunction - 10/29/18 ICD implant [employer]  - followed by CHF clinic, has had close f/u with CHF pharmacy clinic as well. CPX 05/2020   11/2019 echo LVEF 25-30%  - has not started farxiga, wanted to see gyn first about recent issues with yeast infections - has stopped home meds, recent move and significant social stress related to separation. Working to get back on meds now.  - last visit restarted toprol 25, entrest 24/69m bid.  - no recent edema, no SOB/DOE - completing treatment for yeast infections - taking lasix 615mdaily.        2. Pulmonary hypertension - type II, her mean PA was 45 and PCWP 41 in setting of severel volume overload. Moderate RV dysfunction by MRI   3. CAD - moderate nonobstructive disease by cath    - some recent chest pains, mid to left chest. Can vary in character. Burning like pain, can occur at rest. Lasts just a few seconds. No other associated symptoms. No associated with symptoms. 2 episodes over the last month.  - has been walking regularly, up to 4 miles without symptoms. Used to be on PPI but no longer on.      4. Sinus tachcyardia -  She has had prior visits with sinus tach of unclear etiology.         5. OSA - moderate OSA by 01/2019 sleep study - followed by Dr AlElsworth Soho never got cpap machine, has been dealing with several social stressors   6. DM2  F0llowed by endo  7. Hyperlipidemia - 10/02/21 TC 207 TG 148 HDL 39 LDL 138 - just restarted her statin.        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 Daughter is 1720raduating high school this year, interested in CNA program and ultimately nursing.  Past Medical History:  Diagnosis Date   AICD (automatic cardioverter/defibrillator) present    Arrhythmia    Arthritis    CAD (coronary artery disease)    a. 03/2018: cath showing 60% mid-LAD stenosis not significant by FFR.    Chronic combined systolic (congestive) and diastolic (congestive) heart failure ([place]   a. 03/2018: found to have a newly reduced EF of 20% by echocardiogram   Depression    denies   Diabetes mellitus    GERD (gastroesophageal reflux disease)    h/o of   Hypertension    Microproteinuria    S/P thyroid biopsy 2020   follow up once a year,    Sleep apnea      Allergies  Allergen Reactions   Aspartame And Phenylalanine Dermatitis  Current Outpatient Medications  Medication Sig Dispense Refill   apixaban (ELIQUIS) 5 MG TABS tablet Take 1 tablet (5 mg total) by mouth 2 (two) times daily. 180 tablet 3   atorvastatin (LIPITOR) 10 MG tablet Take 1 tablet (10 mg total) by mouth daily. 90 tablet 1   B-D ULTRAFINE III SHORT PEN 31G X 8 MM MISC USE TO INJECT INSULIN TWICE DAILY 100 each 3   benzonatate (TESSALON) 100 MG capsule Take 1 capsule (100 mg total) by mouth 2 (two) times daily as needed for cough. 20 capsule 0   Blood Glucose Monitoring Suppl (CONTOUR NEXT MONITOR) w/Device KIT Use to monitor glucose 3 times daily as instructed 1 kit 0   Continuous Blood Gluc Sensor (DEXCOM G7 SENSOR) MISC Inject 1 Application into the skin as directed. Change sensor every  10 days as directed. 6 each 3   furosemide (LASIX) 20 MG tablet Take 1 tablet (20 mg total) by mouth daily. With 40 mg tablet to equal 60 mg daily. Can Do additional 20 mg daily for SOB as needed 180 tablet 3   furosemide (LASIX) 40 MG tablet Take 1 tablet (40 mg total) by mouth daily. With 20 mg tablet to equal 60 mg daily. Can Do additional 20 mg daily for SOB as needed 90 tablet 3   glipiZIDE (GLUCOTROL) 5 MG tablet Take 1 tablet (5 mg total) by mouth 2 (two) times daily before a meal. 180 tablet 3   glucose blood (CONTOUR TEST) test strip Use as instructed to monitor glucose 3 times daily as instructed 200 each 12   insulin isophane & regular human KwikPen (NOVOLIN 70/30 KWIKPEN) (70-30) 100 UNIT/ML KwikPen 75 units with breakfast and 90 units with supper if glucose is above 90 and she is eating 60 mL 3   Lancets (ONETOUCH DELICA PLUS [id number]) MISC USE TO TEST BLOOD SUGAR FOUR TIMES DAILY     metFORMIN (GLUCOPHAGE) 500 MG tablet Take 1 tablet (500 mg total) by mouth 2 (two) times daily with a meal. 360 tablet 3   metoprolol (TOPROL-XL) 25 MG 24 hr tablet Take 1 tablet (25 mg total) by mouth daily. 30 tablet 6   NORLYDA 0.35 MG tablet Take 1 tablet by mouth at bedtime.   12   sacubitril-valsartan (ENTRESTO) 24-26 MG Take 1 tablet by mouth 2 (two) times daily. 60 tablet 6   sertraline (ZOLOFT) 50 MG tablet Take 1 tablet (50 mg total) by mouth daily. 90 tablet 1   spironolactone (ALDACTONE) 25 MG tablet Take 12.5 mg by mouth daily.     No current facility-administered medications for this visit.     Past Surgical History:  Procedure Laterality Date   ANTERIOR CERVICAL DECOMP/DISCECTOMY FUSION N/A 12/03/2019   Procedure: ANTERIOR CERVICAL DECOMPRESSION/DISCECTOMY FUSION CERVICAL SIX- CERVICAL SEVEN;  Surgeon: Consuella Lose, MD;  Location: [facility];  Service: Neurosurgery;  Laterality: N/A;  ANTERIOR CERVICAL DECOMPRESSION/DISCECTOMY FUSION CERVICAL SIX- CERVICAL SEVEN   BACK SURGERY  2010    BREAST BIOPSY Left 2017   benign   CARDIAC CATHETERIZATION     ICD IMPLANT N/A 10/29/2018   Procedure: ICD IMPLANT;  Surgeon: Evans Lance, MD;  Location: [facility];  Service: Cardiovascular;  Laterality: N/A;   INTRAVASCULAR PRESSURE WIRE/FFR STUDY N/A 03/24/2018   Procedure: INTRAVASCULAR PRESSURE WIRE/FFR STUDY;  Surgeon: Leonie Man, MD;  Location: [facility];  Service: Cardiovascular;  Laterality: N/A;   NOSE SURGERY     RIGHT/LEFT HEART CATH AND CORONARY  ANGIOGRAPHY N/A 03/24/2018   Procedure: RIGHT/LEFT HEART CATH AND CORONARY ANGIOGRAPHY;  Surgeon: Leonie Man, MD;  Location: [facility];  Service: Cardiovascular;  Laterality: N/A;   TONSILLECTOMY     TOOTH EXTRACTION     September 2022   WISDOM TOOTH EXTRACTION       Allergies  Allergen Reactions   Aspartame And Phenylalanine Dermatitis      Family History  Problem Relation Age of Onset   Breast cancer Mother    Hypertension Mother    Chronic Renal Failure Father    Stroke Sister 31       smoker   Breast cancer Sister    Diabetes Sister    Hypertension Sister    Heart attack Paternal Grandmother    Diabetes Maternal Aunt    Colon cancer Neg Hx      Social History Ms. Leiner reports that she has never smoked. She has never used smokeless tobacco. Ms. Grissom reports no history of alcohol use.   Review of Systems CONSTITUTIONAL: No weight loss, fever, chills, weakness or fatigue.  HEENT: Eyes: No visual loss, blurred vision, double vision or yellow sclerae.No hearing loss, sneezing, congestion, runny nose or sore throat.  SKIN: No rash or itching.  CARDIOVASCULAR: per hpi RESPIRATORY: No shortness of breath, cough or sputum.  GASTROINTESTINAL: No anorexia, nausea, vomiting or diarrhea. No abdominal pain or blood.  GENITOURINARY: No burning on urination, no polyuria NEUROLOGICAL: No headache, dizziness, syncope, paralysis, ataxia, numbness or tingling in the  extremities. No change in bowel or bladder control.  MUSCULOSKELETAL: No muscle, back pain, joint pain or stiffness.  LYMPHATICS: No enlarged nodes. No history of splenectomy.  PSYCHIATRIC: No history of depression or anxiety.  ENDOCRINOLOGIC: No reports of sweating, cold or heat intolerance. No polyuria or polydipsia.  Marland Kitchen   Physical Examination Today's Vitals   10/16/21 1010  BP: 128/88  Pulse: 82  SpO2: 97%  Weight: 193 lb 12.8 oz (87.9 kg)  Height: 5' 7.5" (1.715 m)   Body mass index is 29.91 kg/m.  Gen: resting comfortably, no acute distress HEENT: no scleral icterus, pupils equal round and reactive, no palptable cervical adenopathy,  CV: RRR, no m/r/g no jvd Resp: Clear to auscultation bilaterally GI: abdomen is soft, non-tender, non-distended, normal bowel sounds, no hepatosplenomegaly MSK: extremities are warm, no edema.  Skin: warm, no rash Neuro:  no focal deficits Psych: appropriate affect   Diagnostic Studies  03/2018 echo LVEF 20%, diffuse hypokinesis, cannot eval diastolic function, mild RV dysfunction, PASP 47, small pericardial effusion   03/2018 LHC/RHC Prox LAD lesion is 55% stenosed. DFR 0.92 Otherwise angiographically normal coronary arteries. Hemodynamic findings consistent with severe pulmonary hypertension. LV end diastolic pressure is severely elevated.   SUMMARY: Moderate single-vessel disease with roughly 60% mid LAD (DFR 0.92 -not physiologically significant) Otherwise minimal CAD. 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  (Output 4.48, Index 2.2.)     03/2018 cardiac MRI FINDINGS: 1. Moderately dilated left ventricle with mild concentric hypertrophy and severe systolic function (LVEF = 27%). Severe diffuse hypokinesis and paradoxical septal motion. There is no  late gadolinium enhancement in the left ventricular myocardium. There is significant hyper trabeculation of the left ventricle with ratio of non-compacted to compacted myocardium > 4:1.   LVEDD: 61 mm   LVESD: 51 mm   LVEDV: 170 ml   LVESV: 124 ml  SV: 46 ml   CO: 4.6 L/min   Myocardial mass: 166 g   2. Normal right ventricular size, thickness and moderate systolic dysfunction (LVEF = 30%). There are no regional wall motion abnormalities.   3.  Normal left and right atrial size.   4. Normal size of the aortic root, ascending aorta and pulmonary artery.   5.  Trivial mitral and mild tricuspid regurgitation.   6. Normal pericardium. Mild circumferential pericardial effusion, moderate amount inferior to the heart (up to 11 mm).   IMPRESSION: 1. Moderately dilated left ventricle with mild concentric hypertrophy and severe systolic function (LVEF = 27%). Severe diffuse hypokinesis and paradoxical septal motion. There is no late gadolinium enhancement in the left ventricular myocardium. There is significant hyper trabeculation of the left ventricle with ratio of non-compacted to compacted myocardium > 4:1.   2. Normal right ventricular size, thickness and moderate systolic dysfunction (LVEF = 30%). There are no regional wall motion abnormalities.   3.  Normal left and right atrial size.   4. Normal size of the aortic root, ascending aorta and pulmonary artery.   5.  Trivial mitral and mild tricuspid regurgitation.   6. Normal pericardium. Mild circumferential pericardial effusion, moderate amount inferior to the heart (up to 11 mm).   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     08/2018 echo IMPRESSIONS      1. The left ventricle has severely reduced systolic function, with an ejection fraction of 25-30%. The cavity size was normal. There is mild concentric left ventricular hypertrophy.  Left ventricular diastolic Doppler parameters are consistent with  pseudonormalization. Elevated left ventricular end-diastolic pressure Left ventricular diffuse hypokinesis.  2. The left ventricular apex appears trabeculated.  3. The right ventricle has mildly reduced systolic function. The cavity was normal. There is mildly increased right ventricular wall thickness.  4. The mitral valve is grossly normal. Mild thickening of the mitral valve leaflet. Mitral valve regurgitation is mild to moderate by color flow Doppler.  5. The tricuspid valve is grossly normal.  6. The aortic valve is grossly normal. Mild thickening of the aortic valve.  7. The aortic root is normal in size and structure.     01/2019 IMPRESSIONS 1.  Moderate obstructive sleep apnea syndrome worse during REM sleep is noted with this recording.  AutoPap 5-12 as recommended.   Assessment and Plan  1. Chronic systolic HF/Noncompaction cardiomyopathy - NICM, noncompaction CM by MRI - has been on anticoag in setting of noncompaction and low LVEF. She has an ICD - she stopped her home meds, overwhelmed with stress related to separation and moving out of her house in with her home. We are restarting and titrating over time - increase toprol to 16m daily, increase entresto to 49/543mbid. She will start farxiga 1073maily, had some reservations as she has had prior issues with yeast infections chronically.  - will need to reestablish with Dr BenHaroldine Lawsce back on prior regimen   2. CAD -recent atypical chest pain, not consistent with cardiac chest pain.          F/u 6 weeks, continue titraging HF meds      JonArnoldo Lenis.D.

## 2021-10-18 ENCOUNTER — Ambulatory Visit (INDEPENDENT_AMBULATORY_CARE_PROVIDER_SITE_OTHER): Payer: 59

## 2021-10-18 DIAGNOSIS — I5022 Chronic systolic (congestive) heart failure: Secondary | ICD-10-CM | POA: Diagnosis not present

## 2021-10-18 DIAGNOSIS — Z95810 Presence of automatic (implantable) cardiac defibrillator: Secondary | ICD-10-CM | POA: Diagnosis not present

## 2021-10-18 NOTE — Progress Notes (Signed)
EPIC Encounter for ICM Monitoring  Patient Name: Tara Bryant is a 51 y.o. female Date: 10/18/2021 Primary Care Physican: Coral Spikes, DO Primary Cardiologist: Branch/Bensimhon Electrophysiologist: Lovena Le 08/27/2021 Office Weight: 197 lbs (not weighing at home) 10/16/2021 Office Weight: 193 lbs                              Spoke with patient and heart failure questions reviewed.  Pt asymptomatic for fluid accumulation.  Reports feeling well at this time and voices no complaints.    HeartLogic Heart Failure Index 1 suggesting normal fluid levels.   Prescribed: Furosemide 40 mg Take 60 mg by mouth daily. Can Do additional 20 mg for SOB Spironolactone 25 mg take 0.5 tablet (12.5 mg total) by mouth daily.   Labs 10/02/2021 Creatinine 0.99, BUN 15, Potassium 3.5, Sodium 135, GFR >60  Recommendations:  No changes and encouraged to call if experiencing any fluid symptoms.   Follow-up plan: ICM clinic phone appointment on 11/19/2021.   91 day device clinic remote transmission 11/02/2021.              EP/Cardiology next office visit: Recall 04/12/2022 with Dr Lovena Le.  11/30/2021 with Dr Harl Bowie.             Copy of ICM check sent to Dr. Lovena Le.  3 Month Trend    8 Day Data Trend          Rosalene Billings, RN 10/18/2021 7:47 AM

## 2021-10-19 ENCOUNTER — Ambulatory Visit: Payer: 59 | Admitting: Cardiology

## 2021-11-02 ENCOUNTER — Ambulatory Visit (INDEPENDENT_AMBULATORY_CARE_PROVIDER_SITE_OTHER): Payer: 59

## 2021-11-02 DIAGNOSIS — I428 Other cardiomyopathies: Secondary | ICD-10-CM | POA: Diagnosis not present

## 2021-11-02 LAB — CUP PACEART REMOTE DEVICE CHECK
Battery Remaining Longevity: 150 mo
Battery Remaining Percentage: 100 %
Brady Statistic RV Percent Paced: 0 %
Date Time Interrogation Session: 20230728044200
HighPow Impedance: 82 Ohm
Implantable Lead Implant Date: 20200723
Implantable Lead Location: 753860
Implantable Lead Model: 293
Implantable Lead Serial Number: 443823
Implantable Pulse Generator Implant Date: 20200723
Lead Channel Impedance Value: 512 Ohm
Lead Channel Setting Pacing Amplitude: 3.5 V
Lead Channel Setting Pacing Pulse Width: 0.4 ms
Lead Channel Setting Sensing Sensitivity: 0.5 mV
Pulse Gen Serial Number: 256025

## 2021-11-19 ENCOUNTER — Ambulatory Visit (INDEPENDENT_AMBULATORY_CARE_PROVIDER_SITE_OTHER): Payer: 59

## 2021-11-19 ENCOUNTER — Telehealth: Payer: Self-pay | Admitting: Cardiology

## 2021-11-19 DIAGNOSIS — Z95810 Presence of automatic (implantable) cardiac defibrillator: Secondary | ICD-10-CM | POA: Diagnosis not present

## 2021-11-19 DIAGNOSIS — I10 Essential (primary) hypertension: Secondary | ICD-10-CM

## 2021-11-19 DIAGNOSIS — I5022 Chronic systolic (congestive) heart failure: Secondary | ICD-10-CM | POA: Diagnosis not present

## 2021-11-19 DIAGNOSIS — Z79899 Other long term (current) drug therapy: Secondary | ICD-10-CM

## 2021-11-19 MED ORDER — POTASSIUM CHLORIDE CRYS ER 20 MEQ PO TBCR: 20.0000 meq | EXTENDED_RELEASE_TABLET | Freq: Every day | ORAL | 6 refills | Status: DC

## 2021-11-19 NOTE — Telephone Encounter (Signed)
Start potassium 20 mEq daily. Needs bmet/mg in 1 week please  Zandra Abts MD

## 2021-11-19 NOTE — Telephone Encounter (Signed)
Patient notified and verbalized understanding.  New medication sent to [facility] on [address] now.  Lab orders entered - she will do at [facility].

## 2021-11-19 NOTE — Telephone Encounter (Signed)
Pt c/o medication issue:  1. Name of Medication: Furosemide  2. How are you currently taking this medication (dosage and times per day)?   3. Are you having a reaction (difficulty breathing--STAT)?   4. What is your medication issue?  Having cramps in her feet and legs- wonder if she need to take some Potassium?

## 2021-11-19 NOTE — Telephone Encounter (Signed)
States that she had cramps in feet / legs last evening up into early morning.  States that she normally takes Furosemide '60mg'$  daily, but did take '80mg'$  yesterday as she was having more swelling.  Last Potassium level was 3.5 on 10/02/21.  She is questioning if she needs prescription for Potassium or just add more in her diet.

## 2021-11-21 ENCOUNTER — Other Ambulatory Visit: Payer: Self-pay

## 2021-11-21 MED ORDER — METFORMIN HCL 500 MG PO TABS: 500.0000 mg | ORAL_TABLET | Freq: Two times a day (BID) | ORAL | 0 refills | Status: DC

## 2021-11-21 NOTE — Progress Notes (Signed)
Remote ICD transmission.   

## 2021-11-22 NOTE — Progress Notes (Signed)
EPIC Encounter for ICM Monitoring  Patient Name: Tara Bryant is a 51 y.o. female Date: 11/22/2021 Primary Care Physican: Coral Spikes, DO Primary Cardiologist: Branch/Bensimhon Electrophysiologist: Lovena Le 08/27/2021 Office Weight: 197 lbs (not weighing at home) 10/16/2021 Office Weight: 193 lbs                              Transmission reviewed.    HeartLogic Heart Failure Index 6 suggesting normal fluid levels.   Prescribed: Furosemide 40 mg Take 60 mg by mouth daily. Can Do additional 20 mg for SOB Spironolactone 25 mg take 0.5 tablet (12.5 mg total) by mouth daily.   Labs 10/02/2021 Creatinine 0.99, BUN 15, Potassium 3.5, Sodium 135, GFR >60   Recommendations:  No changes.   Follow-up plan: ICM clinic phone appointment on 11/30/2021 to recheck fluid levels.   91 day device clinic remote transmission 02/01/2022.              EP/Cardiology next office visit: Recall 04/12/2022 with Dr Lovena Le.  11/30/2021 with Dr Harl Bowie.             Copy of ICM check sent to Dr. Lovena Le.  3 Month Trend    8 Day Data Trend          Rosalene Billings, RN 11/22/2021 4:27 PM

## 2021-11-28 ENCOUNTER — Other Ambulatory Visit (HOSPITAL_COMMUNITY)
Admission: RE | Admit: 2021-11-28 | Discharge: 2021-11-28 | Disposition: A | Discharge status: Home / Self Care | Payer: 59 | Source: Ambulatory Visit | Attending: Cardiology | Admitting: Cardiology

## 2021-11-28 DIAGNOSIS — Z79899 Other long term (current) drug therapy: Secondary | ICD-10-CM | POA: Diagnosis not present

## 2021-11-28 DIAGNOSIS — I10 Essential (primary) hypertension: Secondary | ICD-10-CM

## 2021-11-28 DIAGNOSIS — I5022 Chronic systolic (congestive) heart failure: Secondary | ICD-10-CM | POA: Diagnosis not present

## 2021-11-28 LAB — BASIC METABOLIC PANEL
Anion gap: 10 (ref 5–15)
BUN: 19 mg/dL (ref 6–20)
CO2: 25 mmol/L (ref 22–32)
Calcium: 9.2 mg/dL (ref 8.9–10.3)
Chloride: 105 mmol/L (ref 98–111)
Creatinine, Ser: 0.96 mg/dL (ref 0.44–1.00)
GFR, Estimated: >60 mL/min (ref >60)
Glucose, Bld: 77 mg/dL (ref 70–99)
Potassium: 3.3 mmol/L — ABNORMAL LOW (ref 3.5–5.1)
Sodium: 140 mmol/L (ref 135–145)

## 2021-11-28 LAB — MAGNESIUM: Magnesium: 1.5 mg/dL — ABNORMAL LOW (ref 1.7–2.4)

## 2021-11-29 ENCOUNTER — Telehealth: Payer: Self-pay | Admitting: *Deleted

## 2021-11-29 DIAGNOSIS — Z79899 Other long term (current) drug therapy: Secondary | ICD-10-CM

## 2021-11-29 DIAGNOSIS — I5022 Chronic systolic (congestive) heart failure: Secondary | ICD-10-CM

## 2021-11-29 DIAGNOSIS — I10 Essential (primary) hypertension: Secondary | ICD-10-CM

## 2021-11-29 MED ORDER — POTASSIUM CHLORIDE CRYS ER 20 MEQ PO TBCR: 40.0000 meq | EXTENDED_RELEASE_TABLET | Freq: Every day | ORAL | 6 refills | Status: DC

## 2021-11-29 MED ORDER — MAGNESIUM OXIDE 400 MG PO CAPS: ORAL_CAPSULE | ORAL | 6 refills | Status: AC

## 2021-11-29 NOTE — Telephone Encounter (Signed)
-----   Message from Berlinda Last, [place] sent at 11/28/2021  4:08 PM EDT -----  ----- Message ----- From: Arnoldo Lenis, MD Sent: 11/28/2021   4:03 PM EDT To: Berlinda Last, CMA  Potassium and Mg are low. Verify taking potassium chloride 52mq daily, if so increase to 458m daily. Start magnesium oxide '400mg'$  bid x 4 days then '400mg'$  daily. This can happpen sometimes when taking fluid pills, would explain her cramps  J Zandra AbtsD

## 2021-11-29 NOTE — Telephone Encounter (Signed)
Laurine Blazer, LPN  0/73/7106  2:69 PM EDT Back to Top    Notified, copy to pcp.  Has OV tomorrow in [place] with Dr. Harl Bowie.  Will send new prescriptions to [facility] now.

## 2021-11-30 ENCOUNTER — Encounter: Payer: Self-pay | Admitting: Cardiology

## 2021-11-30 ENCOUNTER — Ambulatory Visit (INDEPENDENT_AMBULATORY_CARE_PROVIDER_SITE_OTHER): Payer: 59 | Admitting: Cardiology

## 2021-11-30 ENCOUNTER — Ambulatory Visit (INDEPENDENT_AMBULATORY_CARE_PROVIDER_SITE_OTHER): Payer: 59

## 2021-11-30 VITALS — BP 128/78 | HR 105 | Ht 67.5 in | Wt 187.6 lb

## 2021-11-30 DIAGNOSIS — Z79899 Other long term (current) drug therapy: Secondary | ICD-10-CM

## 2021-11-30 DIAGNOSIS — Z95810 Presence of automatic (implantable) cardiac defibrillator: Secondary | ICD-10-CM

## 2021-11-30 DIAGNOSIS — I5022 Chronic systolic (congestive) heart failure: Secondary | ICD-10-CM

## 2021-11-30 DIAGNOSIS — I10 Essential (primary) hypertension: Secondary | ICD-10-CM | POA: Diagnosis not present

## 2021-11-30 MED ORDER — ENTRESTO 97-103 MG PO TABS: 1.0000 | ORAL_TABLET | Freq: Two times a day (BID) | ORAL | 6 refills | Status: DC

## 2021-11-30 MED ORDER — METOPROLOL SUCCINATE ER 100 MG PO TB24: 100.0000 mg | ORAL_TABLET | Freq: Every day | ORAL | 6 refills | Status: DC

## 2021-11-30 NOTE — Progress Notes (Signed)
Clinical Summary Tara Bryant is a 51 y.o.female seen today for follow up of the following medical problems.        1. 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    - 08/2018 echo: LVEF 25-30%, grade II diastolic dysfunction, mild RV dysfunction - 10/29/18 ICD implant [employer]  - followed by CHF clinic, has had close f/u with CHF pharmacy clinic as well. CPX 05/2020   11/2019 echo LVEF 25-30%   - has not started farxiga, wanted to see gyn first about recent issues with yeast infections - has stopped home meds, recent move and significant social stress related to separation. Working to get back on meds now.    - no side effects with med changes since last visit. No recent SOB/DOE      Other medical issues not addressed this visit.    2. Pulmonary hypertension - type II, her mean PA was 45 and PCWP 41 in setting of severel volume overload. Moderate RV dysfunction by MRI   3. CAD - moderate nonobstructive disease by cath    - some recent chest pains, mid to left chest. Can vary in character. Burning like pain, can occur at rest. Lasts just a few seconds. No other associated symptoms. No associated with symptoms. 2 episodes over the last month.  - has been walking regularly, up to 4 miles without symptoms. Used to be on PPI but no longer on.      4. Sinus tachcyardia -  She has had prior visits with sinus tach of unclear etiology.        5. OSA - moderate OSA  by 01/2019 sleep study - followed by Dr Elsworth Soho - never got cpap machine, has been dealing with several social stressors   6. DM2  F0llowed by endo   7. Hyperlipidemia - 10/02/21 TC 207 TG 148 HDL 39 LDL 138 - just restarted her statin.    8. 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 Daughter is 28 graduating high school this year, interested in CNA program and ultimately nursing.  Daughter at [employer].  Past Medical History:  Diagnosis Date   AICD (automatic cardioverter/defibrillator) present    Arrhythmia    Arthritis    CAD (coronary artery disease)    a. 03/2018: cath showing 60% mid-LAD stenosis not significant by FFR.    Chronic combined systolic (congestive) and diastolic (congestive) heart failure ([facility])    a. 03/2018: found to have a newly reduced EF of 20% by echocardiogram   Depression    denies   Diabetes mellitus    GERD (gastroesophageal reflux disease)    h/o of   Hypertension    Microproteinuria    S/P thyroid biopsy 2020   follow up once a year,    Sleep  apnea      Allergies  Allergen Reactions   Aspartame And Phenylalanine Dermatitis     Current Outpatient Medications  Medication Sig Dispense Refill   apixaban (ELIQUIS) 5 MG TABS tablet Take 1 tablet (5 mg total) by mouth 2 (two) times daily. 180 tablet 3   atorvastatin (LIPITOR) 10 MG tablet Take 1 tablet (10 mg total) by mouth daily. 90 tablet 1   B-D ULTRAFINE III SHORT PEN 31G X 8 MM MISC USE TO INJECT INSULIN TWICE DAILY 100 each 3   benzonatate (TESSALON) 100 MG capsule Take 1 capsule (100 mg total) by mouth 2 (two) times daily as needed for cough. 20 capsule 0   Blood Glucose Monitoring Suppl (CONTOUR NEXT MONITOR) w/Device KIT Use to monitor glucose 3 times daily as instructed 1 kit 0   Continuous Blood Gluc Sensor (DEXCOM G7 SENSOR) MISC Inject 1  Application into the skin as directed. Change sensor every 10 days as directed. 6 each 3   furosemide (LASIX) 20 MG tablet Take 1 tablet (20 mg total) by mouth daily. With 40 mg tablet to equal 60 mg daily. Can Do additional 20 mg daily for SOB as needed 180 tablet 3   furosemide (LASIX) 40 MG tablet Take 1 tablet (40 mg total) by mouth daily. With 20 mg tablet to equal 60 mg daily. Can Do additional 20 mg daily for SOB as needed 90 tablet 3   glipiZIDE (GLUCOTROL) 5 MG tablet Take 1 tablet (5 mg total) by mouth 2 (two) times daily before a meal. 180 tablet 3   glucose blood (CONTOUR TEST) test strip Use as instructed to monitor glucose 3 times daily as instructed 200 each 12   insulin isophane & regular human KwikPen (NOVOLIN 70/30 KWIKPEN) (70-30) 100 UNIT/ML KwikPen 75 units with breakfast and 90 units with supper if glucose is above 90 and she is eating 60 mL 3   Lancets (ONETOUCH DELICA PLUS [id number]) MISC USE TO TEST BLOOD SUGAR FOUR TIMES DAILY     Magnesium Oxide 400 MG CAPS Take 1 capsule (400 mg total) by mouth 2 (two) times daily for 4 days, THEN 1 capsule (400 mg total) daily. 38 capsule 6   metFORMIN (GLUCOPHAGE) 500 MG tablet Take 1 tablet (500 mg total) by mouth 2 (two) times daily with a meal. 180 tablet 0   metoprolol succinate (TOPROL-XL) 50 MG 24 hr tablet Take 1 tablet (50 mg total) by mouth daily. 30 tablet 6   NORLYDA 0.35 MG tablet Take 1 tablet by mouth at bedtime.   12   potassium chloride SA (KLOR-CON M) 20 MEQ tablet Take 2 tablets (40 mEq total) by mouth daily. 60 tablet 6   sacubitril-valsartan (ENTRESTO) 49-51 MG Take 1 tablet by mouth 2 (two) times daily. 60 tablet 6   sertraline (ZOLOFT) 50 MG tablet Take 1 tablet (50 mg total) by mouth daily. 90 tablet 1   spironolactone (ALDACTONE) 25 MG tablet Take 12.5 mg by mouth daily.     No current facility-administered medications for this visit.     Past Surgical History:  Procedure Laterality Date   ANTERIOR CERVICAL  DECOMP/DISCECTOMY FUSION N/A 12/03/2019   Procedure: ANTERIOR CERVICAL DECOMPRESSION/DISCECTOMY FUSION CERVICAL SIX- CERVICAL SEVEN;  Surgeon: Consuella Lose, MD;  Location: [facility];  Service: Neurosurgery;  Laterality: N/A;  ANTERIOR CERVICAL DECOMPRESSION/DISCECTOMY FUSION CERVICAL SIX- CERVICAL SEVEN   BACK SURGERY  2010   BREAST BIOPSY Left 2017   benign   CARDIAC CATHETERIZATION  ICD IMPLANT N/A 10/29/2018   Procedure: ICD IMPLANT;  Surgeon: Evans Lance, MD;  Location: [facility];  Service: Cardiovascular;  Laterality: N/A;   INTRAVASCULAR PRESSURE WIRE/FFR STUDY N/A 03/24/2018   Procedure: INTRAVASCULAR PRESSURE WIRE/FFR STUDY;  Surgeon: Leonie Man, MD;  Location: [facility];  Service: Cardiovascular;  Laterality: N/A;   NOSE SURGERY     RIGHT/LEFT HEART CATH AND CORONARY ANGIOGRAPHY N/A 03/24/2018   Procedure: RIGHT/LEFT HEART CATH AND CORONARY ANGIOGRAPHY;  Surgeon: Leonie Man, MD;  Location: [facility];  Service: Cardiovascular;  Laterality: N/A;   TONSILLECTOMY     TOOTH EXTRACTION     September 2022   WISDOM TOOTH EXTRACTION       Allergies  Allergen Reactions   Aspartame And Phenylalanine Dermatitis      Family History  Problem Relation Age of Onset   Breast cancer Mother    Hypertension Mother    Chronic Renal Failure Father    Stroke Sister 6       smoker   Breast cancer Sister    Diabetes Sister    Hypertension Sister    Heart attack Paternal Grandmother    Diabetes Maternal Aunt    Colon cancer Neg Hx      Social History Tara Bryant reports that she has never smoked. She has never used smokeless tobacco. Tara Bryant reports no history of alcohol use.   Review of Systems CONSTITUTIONAL: No weight loss, fever, chills, weakness or fatigue.  HEENT: Eyes: No visual loss, blurred vision, double vision or yellow sclerae.No hearing loss, sneezing, congestion, runny nose or sore throat.  SKIN: No rash or  itching.  CARDIOVASCULAR: per hpi RESPIRATORY: No shortness of breath, cough or sputum.  GASTROINTESTINAL: No anorexia, nausea, vomiting or diarrhea. No abdominal pain or blood.  GENITOURINARY: No burning on urination, no polyuria NEUROLOGICAL: No headache, dizziness, syncope, paralysis, ataxia, numbness or tingling in the extremities. No change in bowel or bladder control.  MUSCULOSKELETAL: No muscle, back pain, joint pain or stiffness.  LYMPHATICS: No enlarged nodes. No history of splenectomy.  PSYCHIATRIC: No history of depression or anxiety.  ENDOCRINOLOGIC: No reports of sweating, cold or heat intolerance. No polyuria or polydipsia.  Marland Kitchen   Physical Examination Today's Vitals   11/30/21 1446  BP: 128/78  Pulse: (!) 105  SpO2: 98%  Weight: 187 lb 9.6 oz (85.1 kg)  Height: 5' 7.5" (1.715 m)   Body mass index is 28.95 kg/m.  Gen: resting comfortably, no acute distress HEENT: no scleral icterus, pupils equal round and reactive, no palptable cervical adenopathy,  CV: RRR, no m/r/g no jvd Resp: Clear to auscultation bilaterally GI: abdomen is soft, non-tender, non-distended, normal bowel sounds, no hepatosplenomegaly MSK: extremities are warm, no edema.  Skin: warm, no rash Neuro:  no focal deficits Psych: appropriate affect   Diagnostic Studies  03/2018 echo LVEF 20%, diffuse hypokinesis, cannot eval diastolic function, mild RV dysfunction, PASP 47, small pericardial effusion   03/2018 LHC/RHC Prox LAD lesion is 55% stenosed. DFR 0.92 Otherwise angiographically normal coronary arteries. Hemodynamic findings consistent with severe pulmonary hypertension. LV end diastolic pressure is severely elevated.   SUMMARY: Moderate single-vessel disease with roughly 60% mid LAD (DFR 0.92 -not physiologically significant) Otherwise minimal CAD. Severely elevated pulmonary wedge pressure and LVEDP consistent with secondary pulmonary pretension from LV failure (ACUTE COMBINED SYSTOLIC  AND DIASTOLIC HEART FAILURE) Mild-moderately reduced cardiac output/index.  (Output 4.48, Index 2.2.)     03/2018 cardiac  MRI FINDINGS: 1. Moderately dilated left ventricle with mild concentric hypertrophy and severe systolic function (LVEF = 27%). Severe diffuse hypokinesis and paradoxical septal motion. There is no late gadolinium enhancement in the left ventricular myocardium. There is significant hyper trabeculation of the left ventricle with ratio of non-compacted to compacted myocardium > 4:1.   LVEDD: 61 mm   LVESD: 51 mm   LVEDV: 170 ml   LVESV: 124 ml   SV: 46 ml   CO: 4.6 L/min   Myocardial mass: 166 g   2. Normal right ventricular size, thickness and moderate systolic dysfunction (LVEF = 30%). There are no regional wall motion abnormalities.   3.  Normal left and right atrial size.   4. Normal size of the aortic root, ascending aorta and pulmonary artery.   5.  Trivial mitral and mild tricuspid regurgitation.   6. Normal pericardium. Mild circumferential pericardial effusion, moderate amount inferior to the heart (up to 11 mm).   IMPRESSION: 1. Moderately dilated left ventricle with mild concentric hypertrophy and severe systolic function (LVEF = 27%). Severe diffuse hypokinesis and paradoxical septal motion. There is no late gadolinium enhancement in the left ventricular myocardium. There is significant hyper trabeculation of the left ventricle with ratio of non-compacted to compacted myocardium > 4:1.   2. Normal right ventricular size, thickness and moderate systolic dysfunction (LVEF = 30%). There are no regional wall motion abnormalities.   3.  Normal left and right atrial size.   4. Normal size of the aortic root, ascending aorta and pulmonary artery.   5.  Trivial mitral and mild tricuspid regurgitation.   6. Normal pericardium. Mild circumferential pericardial effusion, moderate amount inferior to the heart (up to 11 mm).   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     08/2018 echo IMPRESSIONS      1. The left ventricle has severely reduced systolic function, with an ejection fraction of 25-30%. The cavity size was normal. There is mild concentric left ventricular hypertrophy. Left ventricular diastolic Doppler parameters are consistent with  pseudonormalization. Elevated left ventricular end-diastolic pressure Left ventricular diffuse hypokinesis.  2. The left ventricular apex appears trabeculated.  3. The right ventricle has mildly reduced systolic function. The cavity was normal. There is mildly increased right ventricular wall thickness.  4. The mitral valve is grossly normal. Mild thickening of the mitral valve leaflet. Mitral valve regurgitation is mild to moderate by color flow Doppler.  5. The tricuspid valve is grossly normal.  6. The aortic valve is grossly normal. Mild thickening of the aortic valve.  7. The aortic root is normal in size and structure.     01/2019 IMPRESSIONS 1.  Moderate obstructive sleep apnea syndrome worse during REM sleep is noted with this recording.  AutoPap 5-12 as recommended.     Assessment and Plan  1. Chronic systolic HF/Noncompaction cardiomyopathy - NICM, noncompaction CM by MRI - has been on anticoag in setting of noncompaction and low LVEF. She has an ICD - she stopped her home meds, overwhelmed with stress related to separation and moving out of her house in with her home. We are restarting and titrating over time - we will increase toprol to 161m daily, entresto to 97/1034mbid. Remains hesitant to try farxiga due yeast infections at times though from her report ob/gyn was ok to try.     -nursing visit 3 weeks, if vitals look good increase toprol to 15038maily.  - low K/Mg  ordered for supplement, repeat bmet/mg in 2 weeks.         Arnoldo Lenis, M.D.

## 2021-11-30 NOTE — Progress Notes (Signed)
EPIC Encounter for ICM Monitoring  Patient Name: Tara Bryant is a 51 y.o. female Date: 11/30/2021 Primary Care Physican: Coral Spikes, DO Primary Cardiologist: Branch/Bensimhon Electrophysiologist: Lovena Le 08/27/2021 Office Weight: 197 lbs (not weighing at home) 10/16/2021 Office Weight: 193 lbs                              Transmission reviewed.    [employer] Heart Failure Index 0 suggesting normal fluid levels.   Prescribed: Furosemide 40 mg Take 60 mg by mouth daily. Can Do additional 20 mg for SOB Potassium 20 mEq Take 2 tablets (40 mEq total) by mouth daily. Spironolactone 25 mg take 0.5 tablet (12.5 mg total) by mouth daily.   Labs: 11/28/2021 Creatinine 0.96, BUN 19, Potassium 3.3, Sodium 140, GFR >60 A complete set of results can be found in Results Review.   Recommendations:  No changes.   Follow-up plan: ICM clinic phone appointment on 12/24/2021.   91 day device clinic remote transmission 02/01/2022.              EP/Cardiology next office visit: 04/24/2022 with Dr Lovena Le.              Copy of ICM check sent to Dr. Lovena Le.  3 Month Trend    8 Day Data Trend          Rosalene Billings, RN 11/30/2021 4:51 PM

## 2021-11-30 NOTE — Patient Instructions (Addendum)
Medication Instructions:  Increase Entresto to 97/'103mg'$  twice a day   Increase Toprol XL to '100mg'$  daily  Continue all other medications.     Labwork: BMET, Mg - orders given today Please do in 2 weeks (around 12/14/21) Office will contact with results via phone, letter or mychart.     Testing/Procedures: none  Follow-Up: Pending nurse visit   Any Other Special Instructions Will Be Listed Below (If Applicable). Nurse visit in 3 weeks for vitals.    If you need a refill on your cardiac medications before your next appointment, please call your pharmacy.

## 2021-12-05 ENCOUNTER — Ambulatory Visit (INDEPENDENT_AMBULATORY_CARE_PROVIDER_SITE_OTHER): Payer: 59 | Admitting: Clinical

## 2021-12-05 ENCOUNTER — Encounter (HOSPITAL_COMMUNITY): Payer: Self-pay

## 2021-12-05 DIAGNOSIS — F419 Anxiety disorder, unspecified: Secondary | ICD-10-CM | POA: Diagnosis not present

## 2021-12-05 DIAGNOSIS — F331 Major depressive disorder, recurrent, moderate: Secondary | ICD-10-CM

## 2021-12-05 DIAGNOSIS — R69 Illness, unspecified: Secondary | ICD-10-CM | POA: Diagnosis not present

## 2021-12-05 NOTE — Plan of Care (Signed)
Verbal Consent 
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
## 2021-12-19 ENCOUNTER — Ambulatory Visit (INDEPENDENT_AMBULATORY_CARE_PROVIDER_SITE_OTHER): Payer: 59 | Admitting: Clinical

## 2021-12-19 DIAGNOSIS — F331 Major depressive disorder, recurrent, moderate: Secondary | ICD-10-CM | POA: Diagnosis not present

## 2021-12-19 DIAGNOSIS — R69 Illness, unspecified: Secondary | ICD-10-CM | POA: Diagnosis not present

## 2021-12-19 DIAGNOSIS — F419 Anxiety disorder, unspecified: Secondary | ICD-10-CM
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
## 2021-12-20 ENCOUNTER — Ambulatory Visit (INDEPENDENT_AMBULATORY_CARE_PROVIDER_SITE_OTHER): Payer: 59 | Admitting: Family Medicine

## 2021-12-20 VITALS — BP 116/78 | HR 86 | Temp 98.4°F | Ht 67.5 in | Wt 193.0 lb

## 2021-12-20 DIAGNOSIS — R69 Illness, unspecified: Secondary | ICD-10-CM | POA: Diagnosis not present

## 2021-12-20 DIAGNOSIS — E1165 Type 2 diabetes mellitus with hyperglycemia: Secondary | ICD-10-CM | POA: Diagnosis not present

## 2021-12-20 DIAGNOSIS — F32A Depression, unspecified: Secondary | ICD-10-CM

## 2021-12-20 DIAGNOSIS — I10 Essential (primary) hypertension: Secondary | ICD-10-CM

## 2021-12-20 DIAGNOSIS — I2510 Atherosclerotic heart disease of native coronary artery without angina pectoris: Secondary | ICD-10-CM | POA: Diagnosis not present

## 2021-12-20 DIAGNOSIS — F419 Anxiety disorder, unspecified: Secondary | ICD-10-CM

## 2021-12-20 DIAGNOSIS — E782 Mixed hyperlipidemia: Secondary | ICD-10-CM

## 2021-12-20 MED ORDER — DAPAGLIFLOZIN PROPANEDIOL 10 MG PO TABS: 10.0000 mg | ORAL_TABLET | Freq: Every day | ORAL | 3 refills | Status: DC

## 2021-12-20 NOTE — Assessment & Plan Note (Signed)
Stable.  At goal.  Continue current medications.

## 2021-12-20 NOTE — Assessment & Plan Note (Signed)
Improving.  Continue Zoloft.  Continue follow-up with counselor.

## 2021-12-20 NOTE — Progress Notes (Signed)
Subjective:  [name], female    DOB: 28-Apr-1970  Age: 51 y.o. MRN: [id number]  CC: Chief Complaint  Patient presents with   3 month follow up     Diabetes type 2     HPI:  51 year old female with an extensive past medical history including nonobstructive CAD, chronic systolic heart failure, hypertension, OSA, uncontrolled type 2 diabetes, anxiety and depression, hyperlipidemia presents for follow-up.  Patient's type 2 diabetes remains uncontrolled.  Most recent A1c was 9.9 on July 10.  Follows with endocrinology.  Suspect some of this is due to poor compliance with her medication.  Patient is currently on metformin, glipizide, Novolin 70/30.  She has been instructed to start [place] but has not done so.  She has been recommended to do this by me as well as her cardiologist and endocrinologist.  Patient reports that her anxiety and depression is okay at this time.  She is seeing a Social worker.  She feels like she is doing fair.  She feels like Zoloft is helping.  Hypertension is well controlled.  She is on Entresto, spironolactone, metoprolol, and Lasix.  Has recently had hypokalemia.  Needs to have her follow-up labs drawn.  Needs repeat lipid panel to reassess.  Most recent LDL 138.  She has nonobstructive coronary disease.  She is on statin but will likely need an increase in dosing to get her LDL below 70.  Patient Active Problem List   Diagnosis Date Noted   CAD (coronary artery disease) 09/19/2021   Anxiety and depression 09/19/2021   OSA (obstructive sleep apnea) 93/79/0240   Chronic systolic heart failure ([place]) 02/04/2019   ICD (implantable cardioverter-defibrillator) in place 02/04/2019   Uncontrolled type 2 diabetes mellitus with hyperglycemia ([place]) 06/10/2018   Mixed hyperlipidemia 06/10/2018   Essential hypertension, benign 07/30/2012    Social Hx   Social History   Socioeconomic History   Marital status: Divorced    Spouse name: Not on file    Number of children: 1   Years of education: Not on file   Highest education level: Not on file  Occupational History   Occupation: Home maker  Tobacco Use   Smoking status: Never   Smokeless tobacco: Never  Vaping Use   Vaping Use: Never used  Substance and Sexual Activity   Alcohol use: No   Drug use: No   Sexual activity: Yes    Birth control/protection: Pill  Other Topics Concern   Not on file  Social History Narrative   Not on file   Social Determinants of Health   Financial Resource Strain: Not on file  Food Insecurity: Not on file  Transportation Needs: Not on file  Physical Activity: Not on file  Stress: Not on file  Social Connections: Not on file   Review of Systems  Respiratory:  Negative for shortness of breath.   Cardiovascular:  Negative for chest pain.   Objective:  BP 116/78   Pulse 86   Temp 98.4 F (36.9 C)   Ht 5' 7.5" (1.715 m)   Wt 193 lb (87.5 kg)   SpO2 99%   BMI 29.78 kg/m      12/20/2021    9:53 AM 11/30/2021    2:46 PM 10/16/2021   10:10 AM  BP/Weight  Systolic BP 973 532 992  Diastolic BP 78 78 88  Wt. (Lbs) 193 187.6 193.8  BMI 29.78 kg/m2 28.95 kg/m2 29.91 kg/m2    Physical Exam Vitals and nursing note reviewed.  Constitutional:      General: She is not in acute distress.    Appearance: Normal appearance.  HENT:     Head: Normocephalic and atraumatic.  Cardiovascular:     Rate and Rhythm: Normal rate and regular rhythm.  Pulmonary:     Effort: Pulmonary effort is normal.     Breath sounds: Normal breath sounds. No wheezing, rhonchi or rales.  Neurological:     Mental Status: She is alert.  Psychiatric:     Comments: Flat affect.  Depressed mood.      Lab Results  Component Value Date   WBC 4.5 05/03/2020   HGB 14.1 05/03/2020   HCT 40.8 05/03/2020   PLT 186 05/03/2020   GLUCOSE 77 11/28/2021   CHOL 207 (H) 10/02/2021   TRIG 148 10/02/2021   HDL 39 (L) 10/02/2021   LDLCALC 138 (H) 10/02/2021   ALT 25  10/02/2021   AST 20 10/02/2021   NA 140 11/28/2021   K 3.3 (L) 11/28/2021   CL 105 11/28/2021   CREATININE 0.96 11/28/2021   BUN 19 11/28/2021   CO2 25 11/28/2021   TSH 1.652 10/02/2021   INR 1.1 12/04/2019   HGBA1C 9.9 10/15/2021   MICROALBUR 21.8 (H) 05/12/2020     Assessment & Plan:   Problem List Items Addressed This Visit       Cardiovascular and Mediastinum   Essential hypertension, benign (Chronic)    Stable.  At goal.  Continue current medications.      CAD (coronary artery disease)     Endocrine   Uncontrolled type 2 diabetes mellitus with hyperglycemia (HCC)    Uncontrolled.  Recommended to continue her current medications and to start [place].  Wilder Glade was sent to the pharmacy.      Relevant Medications   dapagliflozin propanediol (FARXIGA) 10 MG TABS tablet   Other Relevant Orders   Microalbumin / creatinine urine ratio     Other   Anxiety and depression    Improving.  Continue Zoloft.  Continue follow-up with counselor.      Mixed hyperlipidemia - Primary    Lipid panel ordered.  Will likely need to increase dose of statin once result comes back if LDL is not less than 70. Continue current dosing for now.      Relevant Orders   Lipid Panel    Meds ordered this encounter  Medications   dapagliflozin propanediol (FARXIGA) 10 MG TABS tablet    Sig: Take 1 tablet (10 mg total) by mouth daily.    Dispense:  90 tablet    Refill:  3    Follow-up:  Return in about 3 months (around 03/21/2022).  [place]

## 2021-12-20 NOTE — Patient Instructions (Signed)
Start the [place].  Be sure to stay compliant with your medications.  Be sure to get your labs in the next 1-2 weeks.  Follow up in 3 months.  Take care  Dr. Lacinda Axon

## 2021-12-20 NOTE — Assessment & Plan Note (Signed)
Uncontrolled.  Recommended to continue her current medications and to start [place].  Wilder Glade was sent to the pharmacy.

## 2021-12-20 NOTE — Assessment & Plan Note (Addendum)
Lipid panel ordered.  Will likely need to increase dose of statin once result comes back if LDL is not less than 70. Continue current dosing for now.

## 2021-12-21 ENCOUNTER — Ambulatory Visit: Payer: 59

## 2021-12-24 ENCOUNTER — Ambulatory Visit (INDEPENDENT_AMBULATORY_CARE_PROVIDER_SITE_OTHER): Payer: 59

## 2021-12-24 DIAGNOSIS — Z95810 Presence of automatic (implantable) cardiac defibrillator: Secondary | ICD-10-CM

## 2021-12-24 DIAGNOSIS — I5022 Chronic systolic (congestive) heart failure: Secondary | ICD-10-CM | POA: Diagnosis not present

## 2021-12-26 ENCOUNTER — Telehealth: Payer: Self-pay

## 2021-12-26 ENCOUNTER — Ambulatory Visit: Payer: 59

## 2021-12-26 NOTE — Telephone Encounter (Signed)
Remote ICM transmission received.  Attempted call to patient regarding ICM remote transmission and no answer.  

## 2021-12-26 NOTE — Progress Notes (Signed)
EPIC Encounter for ICM Monitoring  Patient Name: Tara Bryant is a 51 y.o. female Date: 12/26/2021 Primary Care Physican: Coral Spikes, DO Primary Cardiologist: Branch/Bensimhon Electrophysiologist: Lovena Le 08/27/2021 Office Weight: 197 lbs (not weighing at home) 10/16/2021 Office Weight: 193 lbs                              Attempted call to patient and unable to reach.  Transmission reviewed.    HeartLogic Heart Failure Index 3 suggesting normal fluid levels.   Prescribed: Furosemide 40 mg Take 60 mg by mouth daily. Can Do additional 20 mg for SOB Potassium 20 mEq Take 2 tablets (40 mEq total) by mouth daily. Spironolactone 25 mg take 0.5 tablet (12.5 mg total) by mouth daily.   Labs: 11/28/2021 Creatinine 0.96, BUN 19, Potassium 3.3, Sodium 140, GFR >60 A complete set of results can be found in Results Review.   Recommendations:  Unable to reach.     Follow-up plan: ICM clinic phone appointment on 01/28/2022.   91 day device clinic remote transmission 02/01/2022.              EP/Cardiology next office visit: 04/24/2022 with Dr Lovena Le.              Copy of ICM check sent to Dr. Lovena Le.  3 Month Trend    8 Day Data Trend          Rosalene Billings, RN 12/26/2021 1:51 PM

## 2021-12-26 NOTE — Progress Notes (Signed)
Spoke with patient and heart failure questions reviewed.  Pt asymptomatic for fluid accumulation.  Reports feeling well at this time and voices no complaints. No changes and encouraged to call if experiencing any fluid symptoms.

## 2021-12-28 ENCOUNTER — Ambulatory Visit (INDEPENDENT_AMBULATORY_CARE_PROVIDER_SITE_OTHER): Payer: 59 | Admitting: *Deleted

## 2021-12-28 DIAGNOSIS — Z23 Encounter for immunization: Secondary | ICD-10-CM

## 2022-01-09 ENCOUNTER — Ambulatory Visit (INDEPENDENT_AMBULATORY_CARE_PROVIDER_SITE_OTHER): Payer: 59 | Admitting: Clinical

## 2022-01-09 DIAGNOSIS — F331 Major depressive disorder, recurrent, moderate: Secondary | ICD-10-CM | POA: Diagnosis not present

## 2022-01-09 DIAGNOSIS — F419 Anxiety disorder, unspecified: Secondary | ICD-10-CM

## 2022-01-09 DIAGNOSIS — R69 Illness, unspecified: Secondary | ICD-10-CM | POA: Diagnosis not present

## 2022-01-09 NOTE — Progress Notes (Signed)
IN PERSON   I connected with Tara Bryant on 01/09/22 at 10:00 AM EDT in person and verified that I am speaking with the correct person using two identifiers.   Location: Patient: Office Provider: Office   I discussed the limitations of evaluation and management by telemedicine and the availability of in person appointments. The patient expressed understanding and agreed to proceed.     THERAPIST PROGRESS NOTE   Session Time: 10:00 AM-10:45 AM   Participation Level: Active   Behavioral Response: CasualAlertDepressed   Type of Therapy: Individual Therapy   Treatment Goals addressed: Coping for Depression and ANxiety   Interventions: CBT, Motivational Interviewing, Strength-based and Supportive   Summary: Tara Bryant is a 51 y.o. female who presents with Depression and Anxiety.The OPT therapist worked with the patient for her ongoing outpatient. OPT treatment. The OPT therapist utilized Motivational Interviewing to assist in creating therapeutic repore. The patient in the session was engaged and work in collaboration giving feedback about her triggers and symptoms over the past few weeks. The patient spoke about ongoing adjusting to coming out of a bad relationship which lasted for several years and its impact on her starting to date again. The patient spoke about adjusting to her daughter now being an adult. The OPT therapist utilized Cognitive Behavioral Therapy through cognitive restructuring as well as worked with the patient on self awareness and implementing coping strategies to assist in management of current stressors.The patient was encouraged to be consistent with her medication management and implement reviewed coping strategies. The OPT therapist provided ongoing psychoeducation  and support throughout the session with the patient. The patient continues to work to adjust to transitional life changes which have been triggering.   Suicidal/Homicidal: Nowithout  intent/plan   Therapist Response: The OPT therapist worked with the patient for the patients scheduled session. The patient was engaged in her session and gave feedback in relation to triggers, symptoms, and behavior responses over the past few weeks including the impact of adjusting to multiple changes in a short period of time.The OPT therapist worked with the patient utilizing an in session Cognitive Behavioral Therapy exercise. The patient was responsive in the session and verbalized, " I realize that everything is completely new now Ifeel like I am a teenager again I mean I am single and I do not have a kid to be responsible for because they are an adult and I am out here looking to get a job".  The patient spoke about being involved with a new church and no longer being involved with her old church. The OPT therapist in this session examinged the patients support network and reviewed coping strategies to help in management of MH symptoms. The patient will continue to apply for employment but also will be practicing mock interviewing with her daughter to improve her interview skill set. The patient verbalized since no longer being involved with her old church and around certain people she has felt much less anxious. The patient did apply for the 2nd time and was denied again for Disability. The OPT therapist will continue treatment work with the patient in her next scheduled session   Plan: Return again in 2/3 weeks.   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 Patient/Guardian was advised if they have not already done so to contact the registration department to sign all necessary  forms in order for [place] to release information regarding their care.    Consent: Patient/Guardian gives verbal consent for treatment and assignment of benefits for services provided during this visit. Patient/Guardian expressed understanding and agreed to proceed.    I discussed the assessment and treatment plan with the patient. The patient was provided an opportunity to ask questions and all were answered. The patient agreed with the plan and demonstrated an understanding of the instructions.   The patient was advised to call back or seek an in-person evaluation if the symptoms worsen or if the condition fails to improve as anticipated.   I provided 45 minutes of face-to-face time during this encounter.     Lennox Grumbles, LCSW   01/09/2022

## 2022-01-14 ENCOUNTER — Telehealth: Payer: Self-pay | Admitting: Nurse Practitioner

## 2022-01-14 NOTE — Telephone Encounter (Signed)
New message      1. Which medications need to be refilled? (please list name of each medication and dose if known) B-D ULTRAFINE III SHORT PEN 31G X 8 MM MISC  2. Which pharmacy/location (including street and city if local pharmacy) is medication to be sent to?[facility] [place]    3. Do they need a 30 day or 90 day supply? 30 day supply

## 2022-01-15 ENCOUNTER — Other Ambulatory Visit: Payer: Self-pay | Admitting: *Deleted

## 2022-01-15 MED ORDER — BD PEN NEEDLE SHORT U/F 31G X 8 MM MISC: 3 refills | Status: DC

## 2022-01-15 NOTE — Telephone Encounter (Signed)
Patient's refill for [facility] needles [facility] in [place].

## 2022-01-15 NOTE — Telephone Encounter (Signed)
[facility] in [place].

## 2022-01-16 ENCOUNTER — Other Ambulatory Visit (HOSPITAL_COMMUNITY): Payer: Self-pay

## 2022-01-28 ENCOUNTER — Ambulatory Visit (INDEPENDENT_AMBULATORY_CARE_PROVIDER_SITE_OTHER): Payer: 59

## 2022-01-28 DIAGNOSIS — I5022 Chronic systolic (congestive) heart failure: Secondary | ICD-10-CM | POA: Diagnosis not present

## 2022-01-28 DIAGNOSIS — Z95810 Presence of automatic (implantable) cardiac defibrillator: Secondary | ICD-10-CM

## 2022-01-29 NOTE — Progress Notes (Signed)
EPIC Encounter for ICM Monitoring  Patient Name: Tara Bryant is a 51 y.o. female Date: 01/29/2022 Primary Care Physican: Coral Spikes, DO Primary Cardiologist: Branch/Bensimhon Electrophysiologist: Lovena Le 08/27/2021 Office Weight: 197 lbs (not weighing at home) 11/30/2021 Office Weight: 187 lbs                              Spoke with patient and heart failure questions reviewed.  Transmission results reviewed.  Pt asymptomatic for fluid accumulation.  Reports feeling well at this time and voices no complaints.     HeartLogic Heart Failure Index 0 suggesting normal fluid levels.   Prescribed: Furosemide 40 mg Take 60 mg by mouth daily. Can Do additional 20 mg for SOB Potassium 20 mEq Take 2 tablets (40 mEq total) by mouth daily. Spironolactone 25 mg take 0.5 tablet (12.5 mg total) by mouth daily.   Labs: 11/28/2021 Creatinine 0.96, BUN 19, Potassium 3.3, Sodium 140, GFR >60 A complete set of results can be found in Results Review.   Recommendations:  No changes and encouraged to call if experiencing any fluid symptoms.   Follow-up plan: ICM clinic phone appointment on 03/04/2022.   91 day device clinic remote transmission 05/03/2022.              EP/Cardiology next office visit: 04/24/2022 with Dr Lovena Le.              Copy of ICM check sent to Dr. Lovena Le.  3 Month Trend    8 Day Data Trend          Rosalene Billings, RN 01/29/2022 2:37 PM

## 2022-01-30 ENCOUNTER — Ambulatory Visit (INDEPENDENT_AMBULATORY_CARE_PROVIDER_SITE_OTHER): Payer: 59 | Admitting: Clinical

## 2022-01-30 DIAGNOSIS — R69 Illness, unspecified: Secondary | ICD-10-CM | POA: Diagnosis not present

## 2022-01-30 DIAGNOSIS — F331 Major depressive disorder, recurrent, moderate: Secondary | ICD-10-CM | POA: Diagnosis not present

## 2022-01-30 DIAGNOSIS — F419 Anxiety disorder, unspecified: Secondary | ICD-10-CM

## 2022-01-30 NOTE — Progress Notes (Signed)
IN PERSON   I connected with Tara Bryant on 01/30/22 at 10:00 AM EDT in person and verified that I am speaking with the correct person using two identifiers.   Location: Patient: Office Provider: Office   I discussed the limitations of evaluation and management by telemedicine and the availability of in person appointments. The patient expressed understanding and agreed to proceed.     THERAPIST PROGRESS NOTE   Session Time: 10:00 AM-10:45 AM   Participation Level: Active   Behavioral Response: CasualAlertDepressed   Type of Therapy: Individual Therapy   Treatment Goals addressed: Coping for Depression and ANxiety   Interventions: CBT, Motivational Interviewing, Strength-based and Supportive   Summary: Tara Bryant is a 51 y.o. female who presents with Depression and Anxiety.The OPT therapist worked with the patient for her ongoing outpatient. OPT treatment. The OPT therapist utilized Motivational Interviewing to assist in creating therapeutic repore. The patient in the session was engaged and work in collaboration giving feedback about her triggers and symptoms over the past few weeks. The patient spoke about ongoing effort to gain employment and that she continues to apply for work. Additionally the patient spoke about her relationship with being a recent transitional change for the patient who has recently started dating again after a 7 years relationship that ended badly. The patient also spoke about her interactions at home between herself, daughter, and Mother. The OPT therapist utilized Cognitive Behavioral Therapy through cognitive restructuring as well as worked with the patient on self awareness and implementing coping strategies to assist in management of current stressors.The patient was encouraged to be consistent with her medication management and implement reviewed coping strategies. The OPT therapist provided ongoing psychoeducation  and support throughout the  session with the patient. The patient continues to work to adjust to transitional life changes which have been triggering. The OPT therapist overviewed upcoming health appointments with the patient as listed in the patients MyChart.   Suicidal/Homicidal: Nowithout intent/plan   Therapist Response: The OPT therapist worked with the patient for the patients scheduled session. The patient was engaged in her session and gave feedback in relation to triggers, symptoms, and behavior responses over the past few weeks including the impact of adjusting to multiple changes in a short period of time.The OPT therapist worked with the patient utilizing an in session Cognitive Behavioral Therapy exercise. The patient was responsive in the session and verbalized, " It just seems to me like things are moving really fast for instance I just met my boyfriends family this past weekend and we have just been dating 2 months".  The patient spoke about her ongoing effort to find employment recently interviewing for [employer] and after being told she had the position she was told due to hours being cut they no longer could offer her the position. The OPT therapist in this session examinged the patients support network and reviewed coping strategies to help in management of MH symptoms. The patient spoke about feeling let down things did not work out with [employer] and the stress financially as she is starting to get notices for collections, however, verbalized she is going to continue to apply and work to get a job. The OPT therapist will continue treatment work with the patient in her next scheduled session   Plan: Return again in 2/3 weeks.   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 Patient/Guardian was advised if they have not already done so to contact the registration department to sign all necessary forms in order for [place] to release information regarding their care.    Consent: Patient/Guardian gives verbal consent for treatment and assignment of benefits for services provided during this visit. Patient/Guardian expressed understanding and agreed to proceed.    I discussed the assessment and treatment plan with the patient. The patient was provided an opportunity to ask questions and all were answered. The patient agreed with the plan and demonstrated an understanding of the instructions.   The patient was advised to call back or seek an in-person evaluation if the symptoms worsen or if the condition fails to improve as anticipated.   I provided 45 minutes of face-to-face time during this encounter.     Tara Grumbles, LCSW   01/30/2022

## 2022-02-01 ENCOUNTER — Ambulatory Visit (INDEPENDENT_AMBULATORY_CARE_PROVIDER_SITE_OTHER): Payer: 59

## 2022-02-01 DIAGNOSIS — I428 Other cardiomyopathies: Secondary | ICD-10-CM | POA: Diagnosis not present

## 2022-02-01 LAB — CUP PACEART REMOTE DEVICE CHECK
Battery Remaining Longevity: 144 mo
Battery Remaining Percentage: 100 %
Brady Statistic RV Percent Paced: 0 %
Date Time Interrogation Session: 20231027044100
HighPow Impedance: 79 Ohm
Implantable Lead Connection Status: 753985
Implantable Lead Implant Date: 20200723
Implantable Lead Location: 753860
Implantable Lead Model: 293
Implantable Lead Serial Number: 443823
Implantable Pulse Generator Implant Date: 20200723
Lead Channel Impedance Value: 500 Ohm
Lead Channel Setting Pacing Amplitude: 3.5 V
Lead Channel Setting Pacing Pulse Width: 0.4 ms
Lead Channel Setting Sensing Sensitivity: 0.5 mV
Pulse Gen Serial Number: 256025
Zone Setting Status: 755011

## 2022-02-07 NOTE — Progress Notes (Signed)
Remote ICD transmission.   

## 2022-02-15 ENCOUNTER — Other Ambulatory Visit: Payer: Self-pay | Admitting: Nurse Practitioner

## 2022-02-18 ENCOUNTER — Encounter: Payer: Self-pay | Admitting: Nurse Practitioner

## 2022-02-18 ENCOUNTER — Ambulatory Visit (INDEPENDENT_AMBULATORY_CARE_PROVIDER_SITE_OTHER): Payer: 59 | Admitting: Nurse Practitioner

## 2022-02-18 VITALS — BP 119/79 | HR 96 | Ht 67.5 in | Wt 194.4 lb

## 2022-02-18 DIAGNOSIS — I10 Essential (primary) hypertension: Secondary | ICD-10-CM

## 2022-02-18 DIAGNOSIS — E782 Mixed hyperlipidemia: Secondary | ICD-10-CM

## 2022-02-18 DIAGNOSIS — E1165 Type 2 diabetes mellitus with hyperglycemia: Secondary | ICD-10-CM | POA: Diagnosis not present

## 2022-02-18 DIAGNOSIS — E559 Vitamin D deficiency, unspecified: Secondary | ICD-10-CM | POA: Diagnosis not present

## 2022-02-18 LAB — POCT GLYCOSYLATED HEMOGLOBIN (HGB A1C): Hemoglobin A1C: 11 % — AB (ref 4.0–5.6)

## 2022-02-18 MED ORDER — NOVOLIN 70/30 FLEXPEN (70-30) 100 UNIT/ML ~~LOC~~ SUPN
PEN_INJECTOR | SUBCUTANEOUS | 3 refills | Status: DC
Start: 2022-02-18 — End: 2022-09-16

## 2022-02-18 MED ORDER — NOVOLIN 70/30 FLEXPEN (70-30) 100 UNIT/ML ~~LOC~~ SUPN: PEN_INJECTOR | SUBCUTANEOUS | 3 refills | Status: DC

## 2022-02-18 MED ORDER — NOVOLIN 70/30 FLEXPEN (70-30) 100 UNIT/ML ~~LOC~~ SUPN
PEN_INJECTOR | SUBCUTANEOUS | 3 refills | Status: DC
Start: 2022-02-18 — End: 2022-02-18

## 2022-02-18 MED ORDER — GLIPIZIDE 5 MG PO TABS: 5.0000 mg | ORAL_TABLET | Freq: Two times a day (BID) | ORAL | 3 refills | Status: DC

## 2022-02-18 MED ORDER — NOVOLIN 70/30 FLEXPEN (70-30) 100 UNIT/ML ~~LOC~~ SUPN
PEN_INJECTOR | SUBCUTANEOUS | 3 refills | Status: DC
Start: 2022-02-18 — End: 2022-03-21

## 2022-02-18 NOTE — Progress Notes (Signed)
02/18/2022, 10:48 AM  Endocrinology follow-up note                              Subjective:    [name], female    DOB: 23-Oct-1970.  Tara Bryant is being seen in follow-up in  management of currently uncontrolled symptomatic diabetes requested by  Coral Spikes, DO.   Past Medical History:  Diagnosis Date   AICD (automatic cardioverter/defibrillator) present    Arrhythmia    Arthritis    CAD (coronary artery disease)    a. 03/2018: cath showing 60% mid-LAD stenosis not significant by FFR.    Chronic combined systolic (congestive) and diastolic (congestive) heart failure ([place])    a. 03/2018: found to have a newly reduced EF of 20% by echocardiogram   Depression    denies   Diabetes mellitus    GERD (gastroesophageal reflux disease)    h/o of   Hypertension    Microproteinuria    S/P thyroid biopsy 2020   follow up once a year,    Sleep apnea    Past Surgical History:  Procedure Laterality Date   ANTERIOR CERVICAL DECOMP/DISCECTOMY FUSION N/A 12/03/2019   Procedure: ANTERIOR CERVICAL DECOMPRESSION/DISCECTOMY FUSION CERVICAL SIX- CERVICAL SEVEN;  Surgeon: Consuella Lose, MD;  Location: [facility];  Service: Neurosurgery;  Laterality: N/A;  ANTERIOR CERVICAL DECOMPRESSION/DISCECTOMY FUSION CERVICAL SIX- CERVICAL SEVEN   BACK SURGERY  2010   BREAST BIOPSY Left 2017   benign   CARDIAC CATHETERIZATION     ICD IMPLANT N/A 10/29/2018   Procedure: ICD IMPLANT;  Surgeon: Evans Lance, MD;  Location: [facility];  Service: Cardiovascular;  Laterality: N/A;   INTRAVASCULAR PRESSURE WIRE/FFR STUDY N/A 03/24/2018   Procedure: INTRAVASCULAR PRESSURE WIRE/FFR STUDY;  Surgeon: Leonie Man, MD;  Location: [facility];  Service: Cardiovascular;  Laterality: N/A;   NOSE SURGERY     RIGHT/LEFT HEART CATH AND CORONARY ANGIOGRAPHY N/A 03/24/2018   Procedure: RIGHT/LEFT HEART  CATH AND CORONARY ANGIOGRAPHY;  Surgeon: Leonie Man, MD;  Location: [facility];  Service: Cardiovascular;  Laterality: N/A;   TONSILLECTOMY     TOOTH EXTRACTION     September 2022   WISDOM TOOTH EXTRACTION     Social History   Socioeconomic History   Marital status: Divorced    Spouse name: Not on file   Number of children: 1   Years of education: Not on file   Highest education level: Not on file  Occupational History   Occupation: Home maker  Tobacco Use   Smoking status: Never   Smokeless tobacco: Never  Vaping Use   Vaping Use: Never used  Substance and Sexual Activity   Alcohol use: No   Drug use: No   Sexual activity: Yes    Birth control/protection: Pill  Other Topics Concern   Not on file  Social History Narrative   Not on file   Social Determinants of Health   Financial Resource Strain: Not on file  Food Insecurity: Not on file  Transportation Needs: Not on file  Physical Activity: Not on file  Stress: Not on file  Social Connections: Not on file   Outpatient Encounter Medications as of 02/18/2022  Medication Sig   apixaban (ELIQUIS) 5 MG TABS tablet Take 1 tablet (5 mg total) by mouth 2 (two) times daily.   atorvastatin (LIPITOR) 10 MG tablet Take 1 tablet (10 mg total) by mouth daily.   B-D ULTRAFINE III SHORT PEN 31G X 8 MM MISC USE TO INJECT INSULIN TWICE DAILY   Blood Glucose Monitoring Suppl (CONTOUR NEXT MONITOR) w/Device KIT Use to monitor glucose 3 times daily as instructed   dapagliflozin propanediol (FARXIGA) 10 MG TABS tablet Take 1 tablet (10 mg total) by mouth daily.   furosemide (LASIX) 20 MG tablet Take 1 tablet (20 mg total) by mouth daily. With 40 mg tablet to equal 60 mg daily. Can Do additional 20 mg daily for SOB as needed   furosemide (LASIX) 40 MG tablet Take 1 tablet (40 mg total) by mouth daily. With 20 mg tablet to equal 60 mg daily. Can Do additional 20 mg daily for SOB as needed   glucose blood (CONTOUR TEST) test  strip Use as instructed to monitor glucose 3 times daily as instructed   Lancets (ONETOUCH DELICA PLUS [id number]) MISC USE TO TEST BLOOD SUGAR FOUR TIMES DAILY   metFORMIN (GLUCOPHAGE) 500 MG tablet Take 2 tablets (1,000 mg total) by mouth 2 (two) times daily with a meal.   metoprolol succinate (TOPROL-XL) 100 MG 24 hr tablet Take 1 tablet (100 mg total) by mouth daily.   NORLYDA 0.35 MG tablet Take 1 tablet by mouth at bedtime.    potassium chloride SA (KLOR-CON M) 20 MEQ tablet Take 2 tablets (40 mEq total) by mouth daily.   sacubitril-valsartan (ENTRESTO) 97-103 MG Take 1 tablet by mouth 2 (two) times daily.   sertraline (ZOLOFT) 50 MG tablet Take 1 tablet (50 mg total) by mouth daily.   spironolactone (ALDACTONE) 25 MG tablet Take 12.5 mg by mouth daily.   [DISCONTINUED] glipiZIDE (GLUCOTROL) 5 MG tablet Take 1 tablet (5 mg total) by mouth 2 (two) times daily before a meal.   [DISCONTINUED] insulin isophane & regular human KwikPen (NOVOLIN 70/30 KWIKPEN) (70-30) 100 UNIT/ML KwikPen 75 units with breakfast and 90 units with supper if glucose is above 90 and she is eating   Continuous Blood Gluc Sensor (DEXCOM G7 SENSOR) MISC Inject 1 Application into the skin as directed. Change sensor every 10 days as directed. (Patient not taking: Reported on 12/20/2021)   glipiZIDE (GLUCOTROL) 5 MG tablet Take 1 tablet (5 mg total) by mouth 2 (two) times daily before a meal.   insulin isophane & regular human KwikPen (NOVOLIN 70/30 KWIKPEN) (70-30) 100 UNIT/ML KwikPen 75 units with breakfast and 75 units with supper if glucose is above 90 and she is eating   insulin isophane & regular human KwikPen (NOVOLIN 70/30 KWIKPEN) (70-30) 100 UNIT/ML KwikPen 75 units with breakfast and 75 units with supper if glucose is above 90 and she is eating   [DISCONTINUED] insulin isophane & regular human KwikPen (NOVOLIN 70/30 KWIKPEN) (70-30) 100 UNIT/ML KwikPen 75 units with breakfast and 75 units with supper if glucose is above  90 and she is eating   [DISCONTINUED] insulin isophane & regular human KwikPen (NOVOLIN 70/30 KWIKPEN) (70-30) 100 UNIT/ML KwikPen 75 units with breakfast and 75 units with supper if glucose is above 90 and she is eating   No facility-administered encounter medications on file as of 02/18/2022.  ALLERGIES: Allergies  Allergen Reactions   Aspartame And Phenylalanine Dermatitis    VACCINATION STATUS: Immunization History  Administered Date(s) Administered   Influenza Split 02/02/2013   Influenza,inj,Quad PF,6+ Mos 01/11/2014, 12/15/2014, 12/15/2015, 01/03/2017, 01/28/2018, 02/03/2019, 02/17/2020, 02/16/2021, 12/28/2021   Influenza-Unspecified 03/08/2011   Pneumococcal Polysaccharide-23 03/30/2009   Td 02/04/2006   Tdap 02/26/2018    Diabetes She presents for her follow-up diabetic visit. She has type 2 diabetes mellitus. Onset time: She was diagnosed at approximate age of 63 years. Her disease course has been worsening. There are no hypoglycemic associated symptoms. Pertinent negatives for hypoglycemia include no confusion, headaches, pallor or seizures. Associated symptoms include blurred vision, fatigue and visual change. Pertinent negatives for diabetes include no chest pain, no polydipsia, no polyphagia, no polyuria and no weight loss. There are no hypoglycemic complications. Symptoms are stable. Diabetic complications include heart disease. Risk factors for coronary artery disease include diabetes mellitus, dyslipidemia, hypertension, family history, sedentary lifestyle and stress. Current diabetic treatment includes insulin injections and oral agent (dual therapy). She is compliant with treatment some of the time (often missed her second dose of Metformin and Glipizide and insulin). Her weight is decreasing steadily. She is following a generally unhealthy diet. When asked about meal planning, she reported none. She has had a previous visit with a dietitian. She never participates in  exercise. Her home blood glucose trend is increasing steadily. Her breakfast blood glucose range is generally >200 mg/dl. Her dinner blood glucose range is generally >200 mg/dl. Her overall blood glucose range is >200 mg/dl. (She presents today with her meter and logs showing gross hyperglycemic overall.  She has also not been performing routine glucose checks as recommended.  Her POCT A1c today is 11%, increasing from last visit of 9.9%.  She admits she misses her second shot of insulin as she eats out and does not take her insulin with her.  Analysis of her meter shows 7-day average of 379 with 7 readings; 14-day average of 362 with 12 readings; 30-day average of 375 with 18 readings; and 90-day average of 250 with 101 readings.) An ACE inhibitor/angiotensin II receptor blocker is being taken. She does not see a podiatrist.Eye exam is current.  Hyperlipidemia This is a chronic problem. The current episode started more than 1 year ago. The problem is uncontrolled. Recent lipid tests were reviewed and are high. Exacerbating diseases include diabetes. Factors aggravating her hyperlipidemia include beta blockers and fatty foods. Pertinent negatives include no chest pain, myalgias or shortness of breath. Current antihyperlipidemic treatment includes statins. Compliance problems include adherence to diet and adherence to exercise.  Risk factors for coronary artery disease include dyslipidemia, diabetes mellitus, hypertension, a sedentary lifestyle and family history.  Hypertension This is a chronic problem. The current episode started more than 1 year ago. The problem has been gradually improving since onset. The problem is controlled. Associated symptoms include blurred vision. Pertinent negatives include no chest pain, headaches, palpitations or shortness of breath. There are no associated agents to hypertension. Risk factors for coronary artery disease include diabetes mellitus, dyslipidemia, family history and  sedentary lifestyle. Past treatments include beta blockers, diuretics and angiotensin blockers. The current treatment provides significant improvement. There are no compliance problems.  Hypertensive end-organ damage includes CAD/MI and heart failure.     Review of systems  Constitutional: + steadily increasing body weight,  current Body mass index is 30 kg/m. , no fatigue, no subjective hyperthermia, no subjective hypothermia Eyes: no blurry vision, no xerophthalmia ENT: no  sore throat, no nodules palpated in throat, no dysphagia/odynophagia, no hoarseness Cardiovascular: no chest pain, some intermittent SOB r/t CHF, no palpitations, no leg swelling Respiratory: no cough, some intermittent shortness of breath r/t CHF Gastrointestinal: no nausea/vomiting/diarrhea Musculoskeletal: no muscle/joint aches Skin: no rashes, no hyperemia,  Neurological: no tremors, no numbness, no tingling, no dizziness Psychiatric: + depression, + intermittent anxiety, has had great amount of stress   Objective:    BP 119/79 (BP Location: Left Arm, Patient Position: Sitting, Cuff Size: Large)   Pulse 96   Ht 5' 7.5" (1.715 m)   Wt 194 lb 6.4 oz (88.2 kg)   BMI 30.00 kg/m   Wt Readings from Last 3 Encounters:  02/18/22 194 lb 6.4 oz (88.2 kg)  12/20/21 193 lb (87.5 kg)  11/30/21 187 lb 9.6 oz (85.1 kg)    BP Readings from Last 3 Encounters:  02/18/22 119/79  12/20/21 116/78  11/30/21 128/78     Physical Exam- Limited  Constitutional:  Body mass index is 30 kg/m. , not in acute distress, anxious, depressed state of mind, avoids eye contact Eyes:  EOMI, no exophthalmos Neck: Supple Musculoskeletal: no gross deformities, strength intact in all four extremities, no gross restriction of joint movements Skin:  no rashes, no hyperemia Neurological: no tremor with outstretched hands    CMP ( most recent) CMP     Component Value Date/Time   NA 140 11/28/2021 1256   NA 138 04/03/2018 1240   K  3.3 (L) 11/28/2021 1256   CL 105 11/28/2021 1256   CO2 25 11/28/2021 1256   GLUCOSE 77 11/28/2021 1256   BUN 19 11/28/2021 1256   BUN 20 04/03/2018 1240   CREATININE 0.96 11/28/2021 1256   CREATININE 0.74 04/06/2014 0934   CALCIUM 9.2 11/28/2021 1256   PROT 7.3 10/02/2021 1034   PROT 6.5 03/26/2017 0916   ALBUMIN 3.9 10/02/2021 1034   ALBUMIN 4.0 03/26/2017 0916   AST 20 10/02/2021 1034   ALT 25 10/02/2021 1034   ALKPHOS 63 10/02/2021 1034   BILITOT 0.8 10/02/2021 1034   BILITOT 0.4 03/26/2017 0916   GFRNONAA >60 11/28/2021 1256   GFRAA >60 01/06/2020 1053     Diabetic Labs (most recent): Lab Results  Component Value Date   HGBA1C 11.0 (A) 02/18/2022   HGBA1C 9.9 10/15/2021   HGBA1C 9.7 (A) 07/10/2021   MICROALBUR 21.8 (H) 05/12/2020   MICROALBUR 33.1 (H) 09/29/2018   MICROALBUR 4.3 (H) 04/06/2014     Lipid Panel ( most recent) Lipid Panel     Component Value Date/Time   CHOL 207 (H) 10/02/2021 1034   CHOL 202 (H) 03/26/2017 0916   TRIG 148 10/02/2021 1034   HDL 39 (L) 10/02/2021 1034   HDL 45 03/26/2017 0916   CHOLHDL 5.3 10/02/2021 1034   VLDL 30 10/02/2021 1034   LDLCALC 138 (H) 10/02/2021 1034   LDLCALC 134 (H) 03/26/2017 0916      Assessment & Plan:   1) Uncontrolled type 2 diabetes mellitus with hyperglycemia (HCC)  - Tara Bryant has currently uncontrolled symptomatic type 2 DM since 51 years of age.  She presents today with her meter and logs showing gross hyperglycemic overall.  She has also not been performing routine glucose checks as recommended.  Her POCT A1c today is 11%, increasing from last visit of 9.9%.  She admits she misses her second shot of insulin as she eats out and does not take her insulin with her.  Analysis of her meter  shows 7-day average of 379 with 7 readings; 14-day average of 362 with 12 readings; 30-day average of 375 with 18 readings; and 90-day average of 250 with 101 readings.  her diabetes is complicated by coronary  artery disease/CHF and she remains at a high risk for more acute and chronic complications which include CAD, CVA, CKD, retinopathy, and neuropathy. These are all discussed in detail with her.  - Nutritional counseling repeated at each appointment due to patients tendency to fall back in to old habits.  - The patient admits there is a room for improvement in their diet and drink choices. -  Suggestion is made for the patient to avoid simple carbohydrates from their diet including Cakes, Sweet Desserts / Pastries, Ice Cream, Soda (diet and regular), Sweet Tea, Candies, Chips, Cookies, Sweet Pastries, Store Bought Juices, Alcohol in Excess of 1-2 drinks a day, Artificial Sweeteners, Coffee Creamer, and "Sugar-free" Products. This will help patient to have stable blood glucose profile and potentially avoid unintended weight gain.   - I encouraged the patient to switch to unprocessed or minimally processed complex starch and increased protein intake (animal or plant source), fruits, and vegetables.   - Patient is advised to stick to a routine mealtimes to eat 3 meals a day and avoid unnecessary snacks (to snack only to correct hypoglycemia).  - I have approached her with the following individualized plan to manage diabetes and patient agrees:   -She has been more consistent with taking her insulin and it has helped tremendously.  -She is advised to continue current regimen of Novolog 70/30 75 units with breakfast and 75 units with supper if glucose is above 90 and she is eating but to be more consistent with taking them.  She is advised to be more consistent with taking her second dose of Metformin (1000 mg po twice daily with meals) and Glipizide (5 mg po twice daily with meals).  -She is urged to continue monitoring blood glucose at least three times per day, before injecting insulin (at breakfast and supper) and before bed.  She is encouraged to call the clinic if she has readings less than 70 or  greater than 300 for 3 tests in a row.  I did send in for CGM Dexcom G7 to her pharmacy. She did not pick it up as she cannot afford it.  - Patient specific target  A1c; LDL, HDL, Triglycerides, were discussed in detail.  2) Blood Pressure /Hypertension: Her blood pressure is controlled to target.  She is advised to continue Lasix 60 mg po daily, Metoprolol 200 mg po daily, and Spironolactone 12.5 mg daily.  She sees Dr. Haroldine Laws for CHF.  3) Lipids/Hyperlipidemia:  Her most recent lipid panel from 10/02/21 shows uncontrolled LDL of 138.  She is advised to continue Lipitor 10 mg po daily at bedtime.  Side effects and precautions discussed with her.    4)  Weight/Diet:  Her Body mass index is 30 kg/m.-she is a candidate for mild weight loss.  I discussed with her the fact that loss of 5 - 10% of her  current body weight will have the most impact on her diabetes management.  CDE Consult will be initiated . Exercise, and detailed carbohydrates information provided  -  detailed on discharge instructions.  5) Chronic Care/Health Maintenance: -she is on ACEI/ARB medications and statin medications and is encouraged to initiate and continue to follow up with Ophthalmology, Dentist,  Podiatrist at least yearly or according to recommendations, and advised  to  stay away from smoking. I have recommended yearly flu vaccine and pneumonia vaccine at least every 5 years; moderate intensity exercise for up to 150 minutes weekly; and  sleep for at least 7 hours a day.  - she is advised to maintain close follow up with Coral Spikes, DO for primary care needs, as well as her other providers for optimal and coordinated care.      I spent 30 minutes in the care of the patient today including review of labs from [facility], Lipids, Thyroid Function, Hematology (current and previous including abstractions from other facilities); face-to-face time discussing  her blood glucose readings/logs, discussing hypoglycemia and  hyperglycemia episodes and symptoms, medications doses, her options of short and long term treatment based on the latest standards of care / guidelines;  discussion about incorporating lifestyle medicine;  and documenting the encounter. Risk reduction counseling performed per USPSTF guidelines to reduce obesity and cardiovascular risk factors.     Please refer to Patient Instructions for Blood Glucose Monitoring and Insulin/Medications Dosing Guide"  in media tab for additional information. Please  also refer to " Patient Self Inventory" in the Media  tab for reviewed elements of pertinent patient history.  Tara Bryant participated in the discussions, expressed understanding, and voiced agreement with the above plans.  All questions were answered to her satisfaction. she is encouraged to contact clinic should she have any questions or concerns prior to her return visit.    Follow up plan: - Return in about 1 month (around 03/20/2022) for Diabetes F/U, Bring meter and logs.  Rayetta Pigg, [facility] [address] [place], [place] [address] Phone: [phone] Fax: [phone]  02/18/2022, 10:48 AM

## 2022-03-04 ENCOUNTER — Ambulatory Visit (INDEPENDENT_AMBULATORY_CARE_PROVIDER_SITE_OTHER): Payer: 59

## 2022-03-04 DIAGNOSIS — I5022 Chronic systolic (congestive) heart failure: Secondary | ICD-10-CM | POA: Diagnosis not present

## 2022-03-04 DIAGNOSIS — Z95810 Presence of automatic (implantable) cardiac defibrillator: Secondary | ICD-10-CM | POA: Diagnosis not present

## 2022-03-05 NOTE — Progress Notes (Signed)
EPIC Encounter for ICM Monitoring  Patient Name: Tara Bryant is a 51 y.o. female Date: 03/05/2022 Primary Care Physican: Coral Spikes, DO Primary Cardiologist: Branch/Bensimhon Electrophysiologist: Lovena Le 08/27/2021 Office Weight: 197 lbs (not weighing at home) 11/30/2021 Office Weight: 187 lbs 02/18/2022 Office Weight: 194 lbs                              Spoke with patient and heart failure questions reviewed.  Transmission results reviewed.  Pt asymptomatic for fluid accumulation.  Reports feeling well at this time and voices no complaints.     [employer] Heart Failure Index 0 suggesting normal fluid levels.   Prescribed: Furosemide 40 mg Take 60 mg by mouth daily. Can Do additional 20 mg for SOB Potassium 20 mEq Take 2 tablets (40 mEq total) by mouth daily. Spironolactone 25 mg take 0.5 tablet (12.5 mg total) by mouth daily.   Labs: 11/28/2021 Creatinine 0.96, BUN 19, Potassium 3.3, Sodium 140, GFR >60 A complete set of results can be found in Results Review.   Recommendations:  No changes and encouraged to call if experiencing any fluid symptoms.   Follow-up plan: ICM clinic phone appointment on 04/15/2022.   91 day device clinic remote transmission 05/03/2022.              EP/Cardiology next office visit: 04/24/2022 with Dr Lovena Le.              Copy of ICM check sent to Dr. Lovena Le.  3 Month Trend    8 Day Data Trend          Rosalene Billings, RN 03/05/2022 10:14 AM

## 2022-03-07 ENCOUNTER — Ambulatory Visit (INDEPENDENT_AMBULATORY_CARE_PROVIDER_SITE_OTHER): Payer: 59 | Admitting: Clinical

## 2022-03-07 DIAGNOSIS — R69 Illness, unspecified: Secondary | ICD-10-CM | POA: Diagnosis not present

## 2022-03-07 DIAGNOSIS — F331 Major depressive disorder, recurrent, moderate: Secondary | ICD-10-CM | POA: Diagnosis not present

## 2022-03-07 DIAGNOSIS — F419 Anxiety disorder, unspecified: Secondary | ICD-10-CM | POA: Diagnosis not present

## 2022-03-07 NOTE — Progress Notes (Signed)
IN PERSON   I connected with Tara Bryant on 03/07/22 at 10:00 AM EDT in person and verified that I am speaking with the correct person using two identifiers.   Location: Patient: Office Provider: Office   I discussed the limitations of evaluation and management by telemedicine and the availability of in person appointments. The patient expressed understanding and agreed to proceed.     THERAPIST PROGRESS NOTE   Session Time: 10:00 AM-10:45 AM   Participation Level: Active   Behavioral Response: CasualAlertDepressed   Type of Therapy: Individual Therapy   Treatment Goals addressed: Coping for Depression and ANxiety   Interventions: CBT, Motivational Interviewing, Strength-based and Supportive   Summary: Tara Bryant is a 51 y.o. female who presents with Depression and Anxiety.The OPT therapist worked with the patient for her ongoing outpatient. OPT treatment. The OPT therapist utilized Motivational Interviewing to assist in creating therapeutic repore. The patient in the session was engaged and work in collaboration giving feedback about her triggers and symptoms over the past few weeks. The patient spoke about ongoing effort to gain employment and that she continues to apply for work and is currently working with a temp agency in [place] to be connected with local employers in the hope of gaining sustainable employment.. Additionally the patient spoke about her relationship with her current boyfriend being stressful noting she feels the relationship is moving to fast with her partner wanting to immediately get engaged and married, and noted her concerns/red flags of him acting controlling/possessive. The OPT therapist utilized Cognitive Behavioral Therapy through cognitive restructuring as well as worked with the patient on self awareness and implementing coping strategies to assist in management of current stressors. The OPT therapist worked with the patient overviewing elements  essential in healthy relationships.The patient was encouraged to be consistent with her medication management and implement reviewed coping strategies. The OPT therapist provided ongoing psychoeducation  and support throughout the session with the patient. The patient continues to work to adjust to transitional life changes which have been triggering. The OPT therapist overviewed upcoming health appointments with the patient as listed in the patients MyChart.   Suicidal/Homicidal: Nowithout intent/plan   Therapist Response: The OPT therapist worked with the patient for the patients scheduled session. The patient was engaged in her session and gave feedback in relation to triggers, symptoms, and behavior responses over the past few weeks including the impact of multiple stressors including her relationship, search for employment, and family interactions.The OPT therapist worked with the patient utilizing an in session Cognitive Behavioral Therapy exercise. The patient was responsive in the session and verbalized, " It just seems to me like my boyfriend is not listening and I know I need to open and honest with him that I am not ready to get married I am worried about how he will take it but I know we need to have that conversation".  The patient spoke about her ongoing effort to find employment recently connecting with a temp work agency to help the patient find employment.. The OPT therapist in this session examinged the patients support network and reviewed coping strategies to help in management of MH symptoms. The patient spoke about feeling let down that she has not found a job yet and pressure from mounting bills that she cannot pay. The OPT therapist encouraged the patient to continue to do what is in her power as she continues her efforts to get hired. The OPT therapist will continue treatment work with the  patient in her next scheduled session   Plan: Return again in 2/3 weeks.   Diagnosis:      Axis  I: Recurrent Moderate Major Depressive Disorder with Anxiety                           Axis II: No diagnosis       Collaboration of Care: No additional collaboration for this session    Patient/Guardian was advised Release of Information must be obtained prior to any record release in order to collaborate their care with an outside provider. Patient/Guardian was advised if they have not already done so to contact the registration department to sign all necessary forms in order for [place] to release information regarding their care.    Consent: Patient/Guardian gives verbal consent for treatment and assignment of benefits for services provided during this visit. Patient/Guardian expressed understanding and agreed to proceed.    I discussed the assessment and treatment plan with the patient. The patient was provided an opportunity to ask questions and all were answered. The patient agreed with the plan and demonstrated an understanding of the instructions.   The patient was advised to call back or seek an in-person evaluation if the symptoms worsen or if the condition fails to improve as anticipated.   I provided 45 minutes of face-to-face time during this encounter.     Lennox Grumbles, LCSW   03/07/2022

## 2022-03-20 ENCOUNTER — Ambulatory Visit: Payer: Self-pay | Admitting: Nurse Practitioner

## 2022-03-21 ENCOUNTER — Ambulatory Visit (INDEPENDENT_AMBULATORY_CARE_PROVIDER_SITE_OTHER): Payer: 59 | Admitting: Family Medicine

## 2022-03-21 VITALS — BP 121/82 | HR 116 | Temp 97.3°F | Ht 67.5 in | Wt 193.0 lb

## 2022-03-21 DIAGNOSIS — F419 Anxiety disorder, unspecified: Secondary | ICD-10-CM

## 2022-03-21 DIAGNOSIS — E1165 Type 2 diabetes mellitus with hyperglycemia: Secondary | ICD-10-CM

## 2022-03-21 DIAGNOSIS — I10 Essential (primary) hypertension: Secondary | ICD-10-CM

## 2022-03-21 DIAGNOSIS — I5022 Chronic systolic (congestive) heart failure: Secondary | ICD-10-CM

## 2022-03-21 DIAGNOSIS — Z91199 Patient's noncompliance with other medical treatment and regimen due to unspecified reason: Secondary | ICD-10-CM | POA: Insufficient documentation

## 2022-03-21 DIAGNOSIS — F32A Depression, unspecified: Secondary | ICD-10-CM

## 2022-03-21 DIAGNOSIS — R69 Illness, unspecified: Secondary | ICD-10-CM | POA: Diagnosis not present

## 2022-03-21 MED ORDER — ENTRESTO 97-103 MG PO TABS: 1.0000 | ORAL_TABLET | Freq: Two times a day (BID) | ORAL | 6 refills | Status: DC

## 2022-03-21 MED ORDER — SERTRALINE HCL 50 MG PO TABS
50.0000 mg | ORAL_TABLET | Freq: Every day | ORAL | 1 refills | Status: DC
Start: 2022-03-21 — End: 2023-07-28

## 2022-03-21 NOTE — Assessment & Plan Note (Signed)
Needs close follow-up with endocrinology.

## 2022-03-21 NOTE — Assessment & Plan Note (Signed)
Continue Zoloft.  Refilled today.  Continue following with counselor.

## 2022-03-21 NOTE — Progress Notes (Signed)
Subjective:  [name], female    DOB: [birth year]  Age: 51 y.o. MRN: [id number]  CC: Chief Complaint  Patient presents with   Hyperlipidemia    Not sure if she is taking cholesterol medication    HPI:  51 year old female with HTN, CAD, CHF, OSA, HLD, Uncontrolled DM-2, Anxiety and Depression, HLD presents for follow up.  Patient's diabetes is uncontrolled.  A1c worsening.  This is likely due to noncompliance.  She is currently followed by endocrinology.  Patient has been encouraged to take Farxiga by cardiology (and by me) and is not taking this medication.  Patient follows with cardiology.  Currently denies chest pain or shortness of breath.  Per the EMR it does not appear that she is taking Entresto as prescribed.  Patient states that she is not taking this consistently.  Advised that this is very important given her heart failure.  Patient following with counselor regarding anxiety and depression.  A lot of stressors currently including financial stressors.  States that she has switched pharmacies.  Will send in Zoloft today.  Patient Active Problem List   Diagnosis Date Noted   Noncompliance 03/21/2022   CAD (coronary artery disease) 09/19/2021   Anxiety and depression 09/19/2021   OSA (obstructive sleep apnea) 48/04/6551   Chronic systolic heart failure ([place]) 02/04/2019   ICD (implantable cardioverter-defibrillator) in place 02/04/2019   Uncontrolled type 2 diabetes mellitus with hyperglycemia ([place]) 06/10/2018   Mixed hyperlipidemia 06/10/2018   Essential hypertension, benign 07/30/2012    Social Hx   Social History   Socioeconomic History   Marital status: Divorced    Spouse name: Not on file   Number of children: 1   Years of education: Not on file   Highest education level: Not on file  Occupational History   Occupation: Home maker  Tobacco Use   Smoking status: Never   Smokeless tobacco: Never  Vaping Use   Vaping Use: Never used   Substance and Sexual Activity   Alcohol use: No   Drug use: No   Sexual activity: Yes    Birth control/protection: Pill  Other Topics Concern   Not on file  Social History Narrative   Not on file   Social Determinants of Health   Financial Resource Strain: Not on file  Food Insecurity: Not on file  Transportation Needs: Not on file  Physical Activity: Not on file  Stress: Not on file  Social Connections: Not on file    Review of Systems Per HPI  Objective:  BP 121/82   Pulse (!) 116   Temp (!) 97.3 F (36.3 C)   Ht 5' 7.5" (1.715 m)   Wt 193 lb (87.5 kg)   SpO2 99%   BMI 29.78 kg/m      03/21/2022    9:55 AM 02/18/2022   10:16 AM 12/20/2021    9:53 AM  BP/Weight  Systolic BP 748 270 786  Diastolic BP 82 79 78  Wt. (Lbs) 193 194.4 193  BMI 29.78 kg/m2 30 kg/m2 29.78 kg/m2    Physical Exam Vitals and nursing note reviewed.  Constitutional:      General: She is not in acute distress.    Appearance: Normal appearance.  HENT:     Head: Normocephalic and atraumatic.  Eyes:     General:        Right eye: No discharge.        Left eye: No discharge.     Conjunctiva/sclera:  Conjunctivae normal.  Cardiovascular:     Rate and Rhythm: Normal rate and regular rhythm.  Pulmonary:     Effort: Pulmonary effort is normal.     Breath sounds: Normal breath sounds. No wheezing or rales.  Neurological:     Mental Status: She is alert.  Psychiatric:     Comments: Flat affect.  Depressed mood.     Lab Results  Component Value Date   WBC 4.5 05/03/2020   HGB 14.1 05/03/2020   HCT 40.8 05/03/2020   PLT 186 05/03/2020   GLUCOSE 77 11/28/2021   CHOL 207 (H) 10/02/2021   TRIG 148 10/02/2021   HDL 39 (L) 10/02/2021   LDLCALC 138 (H) 10/02/2021   ALT 25 10/02/2021   AST 20 10/02/2021   NA 140 11/28/2021   K 3.3 (L) 11/28/2021   CL 105 11/28/2021   CREATININE 0.96 11/28/2021   BUN 19 11/28/2021   CO2 25 11/28/2021   TSH 1.652 10/02/2021   INR 1.1 12/04/2019    HGBA1C 11.0 (A) 02/18/2022   MICROALBUR 21.8 (H) 05/12/2020     Assessment & Plan:   Problem List Items Addressed This Visit       Cardiovascular and Mediastinum   Essential hypertension, benign - Primary (Chronic)    Blood pressure is well-controlled.  Continue current medications.  Advised compliance.      Relevant Medications   sacubitril-valsartan (ENTRESTO) 97-103 MG   Chronic systolic heart failure ([place])    Appears euvolemic today.  Advised that she needs to be compliant with Entresto.  Rx sent.      Relevant Medications   sacubitril-valsartan (ENTRESTO) 97-103 MG     Endocrine   Uncontrolled type 2 diabetes mellitus with hyperglycemia ([place])    Needs close follow-up with endocrinology.        Other   Anxiety and depression    Continue Zoloft.  Refilled today.  Continue following with counselor.      Relevant Medications   sertraline (ZOLOFT) 50 MG tablet    Meds ordered this encounter  Medications   sertraline (ZOLOFT) 50 MG tablet    Sig: Take 1 tablet (50 mg total) by mouth daily.    Dispense:  90 tablet    Refill:  1   sacubitril-valsartan (ENTRESTO) 97-103 MG    Sig: Take 1 tablet by mouth 2 (two) times daily.    Dispense:  60 tablet    Refill:  6    Dose increased 11/30/2021    Follow-up:  6 months  [place]

## 2022-03-21 NOTE — Patient Instructions (Signed)
Continue your medications.  Follow up in 6 months.  Please follow up closely with Endo and cardiology.

## 2022-03-21 NOTE — Assessment & Plan Note (Signed)
Appears euvolemic today.  Advised that she needs to be compliant with Entresto.  Rx sent.

## 2022-03-21 NOTE — Assessment & Plan Note (Signed)
Blood pressure is well-controlled.  Continue current medications.  Advised compliance.

## 2022-04-10 ENCOUNTER — Ambulatory Visit (INDEPENDENT_AMBULATORY_CARE_PROVIDER_SITE_OTHER): Payer: 59 | Admitting: Clinical

## 2022-04-10 DIAGNOSIS — F331 Major depressive disorder, recurrent, moderate: Secondary | ICD-10-CM | POA: Diagnosis not present

## 2022-04-10 DIAGNOSIS — F419 Anxiety disorder, unspecified: Secondary | ICD-10-CM

## 2022-04-10 DIAGNOSIS — R69 Illness, unspecified: Secondary | ICD-10-CM | POA: Diagnosis not present

## 2022-04-10 NOTE — Progress Notes (Signed)
IN PERSON   I connected with Tara Bryant on 04/10/22 at 10:00 AM EDT in person and verified that I am speaking with the correct person using two identifiers.   Location: Patient: Office Provider: Office   I discussed the limitations of evaluation and management by telemedicine and the availability of in person appointments. The patient expressed understanding and agreed to proceed. ( IN PERSON)     THERAPIST PROGRESS NOTE   Session Time: 10:00 AM-10:45 AM   Participation Level: Active   Behavioral Response: CasualAlertDepressed   Type of Therapy: Individual Therapy   Treatment Goals addressed: Coping for Depression and Anxiety   Interventions: CBT, Motivational Interviewing, Strength-based and Supportive   Summary: Tara Bryant is a 52 y.o. female who presents with Depression and Anxiety.The OPT therapist worked with the patient for her ongoing outpatient. OPT treatment. The OPT therapist utilized Motivational Interviewing to assist in creating therapeutic repore. The patient in the session was engaged and work in collaboration giving feedback about her triggers and symptoms over the past few weeks. The patient spoke about ongoing effort to gain employment and that she continues to apply for work and feels like she has a better chance to get hired now being on the other side of the holidays. Additionally the patient spoke about her relationship with her current boyfriend and time spent with him and his family over the recent holidays affirming her ongoing struggle with elements of the relationship and her uncertainty if she want to move further into her commitment with her partner towards marriage. The OPT therapist utilized Cognitive Behavioral Therapy through cognitive restructuring as well as worked with the patient on self awareness and implementing coping strategies to assist in management of current stressors. The OPT therapist worked with the patient overviewing elements  essential in healthy relationships.The patient was encouraged to be consistent with her non- monetary coping strategies and self check ins throughout the week . The OPT therapist provided ongoing psychoeducation  and support throughout the session with the patient. The OPT therapist overviewed upcoming health appointments with the patient as listed in the patients MyChart including upcoming Cardiology appointments in January.   Suicidal/Homicidal: Nowithout intent/plan   Therapist Response: The OPT therapist worked with the patient for the patients scheduled session. The patient was engaged in her session and gave feedback in relation to triggers, symptoms, and behavior responses over the past few weeks including the impact of multiple stressors including her relationship, search for employment, and family interactions.The OPT therapist worked with the patient utilizing an in session Cognitive Behavioral Therapy exercise. The patient was responsive in the session and verbalized, " I am hoping now that we are on the other side of the holidays I can get hired somewhere I have bills that I need to be paying on".  . The OPT therapist in this session examinged the patients support network and reviewed coping strategies to help in management of MH symptoms. The patient spoke about feeling let down that she has not found a job yet and pressure from mounting bills that she cannot pay. The OPT therapist encouraged the patient to continue to do what is in her power as she continues her efforts to get hired. The patient spoke about the decision to move in with her Mother and not having her own place being a trigger and worked with the [place] therapist on coping strategies to take more breaks and get out of the home more. The OPT therapist will continue treatment  work with the patient in her next scheduled session   Plan: Return again in 2/3 weeks.   Diagnosis:      Axis I: Recurrent Moderate Major Depressive Disorder with  Anxiety                           Axis II: No diagnosis       Collaboration of Care: No additional collaboration for this session    Patient/Guardian was advised Release of Information must be obtained prior to any record release in order to collaborate their care with an outside provider. Patient/Guardian was advised if they have not already done so to contact the registration department to sign all necessary forms in order for [place] to release information regarding their care.    Consent: Patient/Guardian gives verbal consent for treatment and assignment of benefits for services provided during this visit. Patient/Guardian expressed understanding and agreed to proceed.    I discussed the assessment and treatment plan with the patient. The patient was provided an opportunity to ask questions and all were answered. The patient agreed with the plan and demonstrated an understanding of the instructions.   The patient was advised to call back or seek an in-person evaluation if the symptoms worsen or if the condition fails to improve as anticipated.   I provided 45 minutes of face-to-face time during this encounter.     Lennox Grumbles, LCSW   04/10/2022

## 2022-04-15 ENCOUNTER — Ambulatory Visit (INDEPENDENT_AMBULATORY_CARE_PROVIDER_SITE_OTHER): Payer: 59

## 2022-04-15 DIAGNOSIS — Z95810 Presence of automatic (implantable) cardiac defibrillator: Secondary | ICD-10-CM | POA: Diagnosis not present

## 2022-04-15 DIAGNOSIS — I5022 Chronic systolic (congestive) heart failure: Secondary | ICD-10-CM

## 2022-04-16 NOTE — Progress Notes (Signed)
EPIC Encounter for ICM Monitoring  Patient Name: Tara Bryant is a 52 y.o. female Date: 04/16/2022 Primary Care Physican: Coral Spikes, DO Primary Cardiologist: Branch/Bensimhon Electrophysiologist: Lovena Le 08/27/2021 Office Weight: 197 lbs (not weighing at home) 11/30/2021 Office Weight: 187 lbs 02/18/2022 Office Weight: 194 lbs 04/16/2022 Weight: 195 lbs                              Spoke with patient and heart failure questions reviewed.  Transmission results reviewed.  Pt asymptomatic for fluid accumulation.  Reports feeling well at this time and voices no complaints.     HeartLogic Heart Failure Index 5 suggesting normal fluid levels.   Prescribed: Furosemide 40 mg Take 60 mg by mouth daily. Can Do additional 20 mg for SOB Potassium 20 mEq Take 2 tablets (40 mEq total) by mouth daily. Spironolactone 25 mg take 0.5 tablet (12.5 mg total) by mouth daily.   Labs: 11/28/2021 Creatinine 0.96, BUN 19, Potassium 3.3, Sodium 140, GFR >60 A complete set of results can be found in Results Review.   Recommendations:  No changes and encouraged to call if experiencing any fluid symptoms.   Follow-up plan: ICM clinic phone appointment on 05/20/2022.   91 day device clinic remote transmission 05/03/2022.              EP/Cardiology next office visit: 04/24/2022 with Dr Lovena Le.              Copy of ICM check sent to Dr. Lovena Le.  3 Month HeartLogicT Heart Failure Index:    8 Day Data Trend:          Rosalene Billings, RN 04/16/2022 4:10 PM

## 2022-04-24 ENCOUNTER — Encounter: Payer: 59 | Admitting: Internal Medicine

## 2022-05-03 ENCOUNTER — Ambulatory Visit (INDEPENDENT_AMBULATORY_CARE_PROVIDER_SITE_OTHER): Payer: 59

## 2022-05-03 DIAGNOSIS — I428 Other cardiomyopathies: Secondary | ICD-10-CM

## 2022-05-03 LAB — CUP PACEART REMOTE DEVICE CHECK
Battery Remaining Longevity: 144 mo
Battery Remaining Percentage: 100 %
Brady Statistic RV Percent Paced: 0 %
Date Time Interrogation Session: 20240126044200
HighPow Impedance: 79 Ohm
Implantable Lead Connection Status: 753985
Implantable Lead Implant Date: 20200723
Implantable Lead Location: 753860
Implantable Lead Model: 293
Implantable Lead Serial Number: 443823
Implantable Pulse Generator Implant Date: 20200723
Lead Channel Impedance Value: 505 Ohm
Lead Channel Setting Pacing Amplitude: 3.5 V
Lead Channel Setting Pacing Pulse Width: 0.4 ms
Lead Channel Setting Sensing Sensitivity: 0.5 mV
Pulse Gen Serial Number: 256025
Zone Setting Status: 755011

## 2022-05-09 ENCOUNTER — Ambulatory Visit (HOSPITAL_COMMUNITY): Payer: 59 | Admitting: Clinical

## 2022-05-20 ENCOUNTER — Ambulatory Visit: Payer: 59

## 2022-05-20 DIAGNOSIS — I5022 Chronic systolic (congestive) heart failure: Secondary | ICD-10-CM

## 2022-05-20 DIAGNOSIS — Z95810 Presence of automatic (implantable) cardiac defibrillator: Secondary | ICD-10-CM

## 2022-05-20 NOTE — Progress Notes (Signed)
Remote ICD transmission.   

## 2022-05-23 NOTE — Progress Notes (Signed)
EPIC Encounter for ICM Monitoring  Patient Name: Tara Bryant is a 52 y.o. female Date: 05/23/2022 Primary Care Physican: Coral Spikes, DO Primary Cardiologist: Branch/B[facility]hon Electrophysiologist: Lovena Le 08/27/2021 Office Weight: 197 lbs (not weighing at home) 11/30/2021 Office Weight: 187 lbs 02/18/2022 Office Weight: 194 lbs 04/16/2022 Weight: 195 lbs 05/23/2022 Weight:                               Spoke with patient and heart failure questions reviewed.  Transmission results reviewed.  Pt asymptomatic for fluid accumulation.  Reports feeling well at this time and voices no complaints.     HeartLogic Heart Failure Index 1 suggesting normal fluid levels.   Prescribed: Furosemide 40 mg Take 60 mg by mouth daily. Can take an additional 20 mg for SOB Potassium 20 mEq Take 2 tablets (40 mEq total) by mouth daily. Spironolactone 25 mg take 0.5 tablet (12.5 mg total) by mouth daily.   Labs: 11/28/2021 Creatinine 0.96, BUN 19, Potassium 3.3, Sodium 140, GFR >60 A complete set of results can be found in Results Review.   Recommendations:  No changes and encouraged to call if experiencing any fluid symptoms.   Follow-up plan: ICM clinic phone appointment on 06/24/2022.   91 day device clinic remote transmission 08/02/2022.              EP/Cardiology next office visit:  Recall 04/12/2022 with Dr Lovena Le.    Recall 05/01/2020 with Dr Haroldine Laws          Copy of ICM check sent to Dr. Lovena Le.  3 Month HeartLogicT Heart Failure Index:    8 Day Data Trend:          Rosalene Billings, RN 05/23/2022 8:18 AM

## 2022-06-06 ENCOUNTER — Encounter: Payer: Self-pay | Admitting: Radiology

## 2022-06-24 ENCOUNTER — Ambulatory Visit: Payer: 59 | Attending: Internal Medicine

## 2022-06-24 DIAGNOSIS — I5022 Chronic systolic (congestive) heart failure: Secondary | ICD-10-CM | POA: Diagnosis not present

## 2022-06-24 DIAGNOSIS — Z95810 Presence of automatic (implantable) cardiac defibrillator: Secondary | ICD-10-CM

## 2022-06-28 ENCOUNTER — Telehealth: Payer: Self-pay

## 2022-06-28 NOTE — Telephone Encounter (Signed)
Remote ICM transmission received.  Attempted call to patient regarding ICM remote transmission and left detailed message per DPR.  Advised to return call for any fluid symptoms or questions. Next ICM remote transmission scheduled 07/29/2022.

## 2022-06-28 NOTE — Progress Notes (Signed)
EPIC Encounter for ICM Monitoring  Patient Name: Tara Bryant is a 52 y.o. female Date: 06/28/2022 Primary Care Physican: Coral Spikes, DO Primary Cardiologist: Branch/[facility]hon Electrophysiologist: Lovena Le 08/27/2021 Office Weight: 197 lbs (not weighing at home) 11/30/2021 Office Weight: 187 lbs 02/18/2022 Office Weight: 194 lbs 04/16/2022 Weight: 195 lbs 05/23/2022 Weight:                               Attempted call to patient and unable to reach.  Left detailed message per DPR regarding transmission. Transmission reviewed.    HeartLogic Heart Failure Index 1 suggesting normal fluid levels.   Prescribed: Furosemide 40 mg Take 60 mg by mouth daily. Can take an additional 20 mg for SOB Potassium 20 mEq Take 2 tablets (40 mEq total) by mouth daily. Spironolactone 25 mg take 0.5 tablet (12.5 mg total) by mouth daily.   Labs: 11/28/2021 Creatinine 0.96, BUN 19, Potassium 3.3, Sodium 140, GFR >60 A complete set of results can be found in Results Review.   Recommendations:  Left voice mail with ICM number and encouraged to call if experiencing any fluid symptoms.   Follow-up plan: ICM clinic phone appointment on 07/29/2022.   91 day device clinic remote transmission 08/02/2022.              EP/Cardiology next office visit:  08/14/2022 with Dr Lovena Le.    Recall 05/01/2020 with Dr Haroldine Laws.  10/16/2021 was last visit with Dr Harl Bowie and advised to reestablish with Dr Haroldine Laws.          Copy of ICM check sent to Dr. Lovena Le.  3 Month HeartLogicT Heart Failure Index:    8 Day Data Trend:          Rosalene Billings, RN 06/28/2022 10:53 AM

## 2022-07-29 ENCOUNTER — Ambulatory Visit: Payer: 59 | Attending: Internal Medicine

## 2022-07-29 DIAGNOSIS — Z95810 Presence of automatic (implantable) cardiac defibrillator: Secondary | ICD-10-CM | POA: Diagnosis not present

## 2022-07-29 DIAGNOSIS — I5022 Chronic systolic (congestive) heart failure: Secondary | ICD-10-CM | POA: Diagnosis not present

## 2022-07-30 ENCOUNTER — Telehealth: Payer: Self-pay

## 2022-07-30 NOTE — Progress Notes (Signed)
EPIC Encounter for ICM Monitoring  Patient Name: Tara Bryant is a 52 y.o. female Date: 07/30/2022 Primary Care Physican: Tommie Sams, DO Primary Cardiologist: Branch/Bensimhon Electrophysiologist: Ladona Ridgel 04/16/2022 Weight: 195 lbs                              Attempted call to patient and unable to reach.  Left detailed message per DPR regarding transmission. Transmission reviewed.    HeartLogic Heart Failure Index 22 suggesting possible fluid accumulation starting 4/5.  Thoracic impedance decreased 4/2 suggesting the start of possible fluid accumulation.   Prescribed: Furosemide 40 mg Take 60 mg by mouth daily. Can take an additional 20 mg for SOB Potassium 20 mEq Take 2 tablets (40 mEq total) by mouth daily. Spironolactone 25 mg take 0.5 tablet (12.5 mg total) by mouth daily.   Labs: 11/28/2021 Creatinine 0.96, BUN 19, Potassium 3.3, Sodium 140, GFR >60 A complete set of results can be found in Results Review.   Recommendations:  Left voice mail with ICM number and encouraged to call if experiencing any fluid symptoms.  Will advise to take additional Furosemide 20 mg as prescribed if patient is reached.    Follow-up plan: ICM clinic phone appointment on 08/05/2022 to recheck fluid levels.   91 day device clinic remote transmission 11/01/2022.              EP/Cardiology next office visit:  08/14/2022 with Dr Ladona Ridgel.    Recall 05/01/2020 with Dr Gala Romney.  10/16/2021 was last visit with Dr Wyline Mood and advised to reestablish with Dr Gala Romney.          Copy of ICM check sent to Dr. Ladona Ridgel.  Will send to Dr Wyline Mood for review if patient is reached.    3 Month HeartLogicT Heart Failure Index:    8 Day Data Trend:          Karie Soda, RN 07/30/2022 11:51 AM

## 2022-07-30 NOTE — Telephone Encounter (Signed)
Remote ICM transmission received.  Attempted call to patient regarding ICM remote transmission and left detailed message per DPR.  Advised to return call for any fluid symptoms or questions. Next ICM remote transmission scheduled 08/05/2022.    

## 2022-08-01 LAB — CUP PACEART REMOTE DEVICE CHECK
Battery Remaining Longevity: 144 mo
Battery Remaining Percentage: 100 %
Brady Statistic RV Percent Paced: 0 %
Date Time Interrogation Session: 20240422084900
HighPow Impedance: 87 Ohm
Implantable Lead Connection Status: 753985
Implantable Lead Implant Date: 20200723
Implantable Lead Location: 753860
Implantable Lead Model: 293
Implantable Lead Serial Number: 443823
Implantable Pulse Generator Implant Date: 20200723
Lead Channel Impedance Value: 469 Ohm
Lead Channel Setting Pacing Amplitude: 3.5 V
Lead Channel Setting Pacing Pulse Width: 0.4 ms
Lead Channel Setting Sensing Sensitivity: 0.5 mV
Pulse Gen Serial Number: 256025
Zone Setting Status: 755011

## 2022-08-02 ENCOUNTER — Ambulatory Visit (INDEPENDENT_AMBULATORY_CARE_PROVIDER_SITE_OTHER): Payer: 59

## 2022-08-02 DIAGNOSIS — I428 Other cardiomyopathies: Secondary | ICD-10-CM

## 2022-08-05 ENCOUNTER — Other Ambulatory Visit: Payer: Self-pay | Admitting: Nurse Practitioner

## 2022-08-05 ENCOUNTER — Ambulatory Visit: Payer: 59 | Attending: Internal Medicine

## 2022-08-05 DIAGNOSIS — Z95810 Presence of automatic (implantable) cardiac defibrillator: Secondary | ICD-10-CM

## 2022-08-05 DIAGNOSIS — I5022 Chronic systolic (congestive) heart failure: Secondary | ICD-10-CM

## 2022-08-06 ENCOUNTER — Telehealth: Payer: Self-pay

## 2022-08-06 NOTE — Progress Notes (Addendum)
Received voice mail message from patient.  She stated she stopped taking diuretic due to starting a new job.  She has resumed the Furosemide and trying to get the fluid off.  She is feeling okay and confirmed appt with Dr Ladona Ridgel.

## 2022-08-06 NOTE — Telephone Encounter (Signed)
Remote ICM transmission received.  Attempted call to patient regarding ICM remote transmission and left detailed message per DPR.  Advised to return call for any fluid symptoms or questions. Next ICM remote transmission scheduled 08/19/2022.    

## 2022-08-06 NOTE — Progress Notes (Signed)
EPIC Encounter for ICM Monitoring  Patient Name: Tara Bryant is a 52 y.o. female Date: 08/06/2022 Primary Care Physican: Tommie Sams, DO Primary Cardiologist: Branch/Bensimhon Electrophysiologist: Ladona Ridgel 04/16/2022 Weight: 195 lbs                              Attempted call to patient and unable to reach.  Left detailed message per DPR regarding transmission. Transmission reviewed.    HeartLogic Heart Failure Index decreased from 22 to 17 suggesting fluid levels starting to return to baseline.  Thoracic impedance decreased 4/2 suggesting the start of possible fluid accumulation.   Prescribed: Furosemide 40 mg Take 60 mg by mouth daily. Can take an additional 20 mg for SOB Potassium 20 mEq Take 2 tablets (40 mEq total) by mouth daily. Spironolactone 25 mg take 0.5 tablet (12.5 mg total) by mouth daily.   Labs: 11/28/2021 Creatinine 0.96, BUN 19, Potassium 3.3, Sodium 140, GFR >60 A complete set of results can be found in Results Review.   Recommendations:  Left voice mail with ICM number and encouraged to call if experiencing any fluid symptoms.  Will advise to take additional Furosemide 20 mg as prescribed if patient is reached.    Follow-up plan: ICM clinic phone appointment on 08/19/2022 to recheck fluid levels.   91 day device clinic remote transmission 11/01/2022.              EP/Cardiology next office visit:  08/14/2022 with Dr Ladona Ridgel.    Recall 05/01/2020 with Dr Gala Romney.  10/16/2021 was last visit with Dr Wyline Mood and advised to reestablish with Dr Gala Romney.          Copy of ICM check sent to Dr. Ladona Ridgel.    3 Month HeartLogicT Heart Failure Index:    8 Day Data Trend:          Karie Soda, RN 08/06/2022 8:52 AM

## 2022-08-07 ENCOUNTER — Telehealth: Payer: Self-pay | Admitting: Nurse Practitioner

## 2022-08-07 NOTE — Telephone Encounter (Signed)
Pt said test strips were denied due to not having an appointment, pt has made an appt for June 10th and was curious if they could be called back into [facility].

## 2022-08-08 ENCOUNTER — Other Ambulatory Visit: Payer: Self-pay | Admitting: *Deleted

## 2022-08-08 MED ORDER — CONTOUR TEST VI STRP: ORAL_STRIP | 0 refills | Status: DC

## 2022-08-08 NOTE — Telephone Encounter (Signed)
A refill was sent in for the test strips. Note to pharmacy asking them to let the patient know that she needed to keep her June appointment, so that future refills will be filled.

## 2022-08-08 NOTE — Telephone Encounter (Signed)
Patient had recently been denied, due to not having been seen in a while. Patient has called and made appointment for June 10. Refill was sent in with no refills and a not to the pharmacy, asking that they share with patient to keep her appointment prior to further refills.

## 2022-08-12 DIAGNOSIS — Z01419 Encounter for gynecological examination (general) (routine) without abnormal findings: Secondary | ICD-10-CM | POA: Diagnosis not present

## 2022-08-14 ENCOUNTER — Encounter: Payer: Self-pay | Admitting: Internal Medicine

## 2022-08-14 ENCOUNTER — Encounter: Payer: Self-pay | Admitting: *Deleted

## 2022-08-14 ENCOUNTER — Ambulatory Visit: Payer: 59 | Attending: Internal Medicine | Admitting: Internal Medicine

## 2022-08-14 VITALS — BP 116/74 | HR 95 | Ht 68.0 in | Wt 180.0 lb

## 2022-08-14 DIAGNOSIS — I10 Essential (primary) hypertension: Secondary | ICD-10-CM | POA: Diagnosis not present

## 2022-08-14 NOTE — Patient Instructions (Signed)
Medication Instructions:  Your physician recommends that you continue on your current medications as directed. Please refer to the Current Medication list given to you today.  *If you need a refill on your cardiac medications before your next appointment, please call your pharmacy*   Lab Work: NONE   If you have labs (blood work) drawn today and your tests are completely normal, you will receive your results only by: MyChart Message (if you have MyChart) OR A paper copy in the mail If you have any lab test that is abnormal or we need to change your treatment, we will call you to review the results.   Testing/Procedures: NONE    Follow-Up: At [facility], you and your health needs are our priority.  As part of our continuing mission to provide you with exceptional heart care, we have created designated Provider Care Teams.  These Care Teams include your primary Cardiologist (physician) and Advanced Practice Providers (APPs -  Physician Assistants and Nurse Practitioners) who all work together to provide you with the care you need, when you need it.  We recommend signing up for the patient portal called "MyChart".  Sign up information is provided on this After Visit Summary.  MyChart is used to connect with patients for Virtual Visits (Telemedicine).  Patients are able to view lab/test results, encounter notes, upcoming appointments, etc.  Non-urgent messages can be sent to your provider as well.   To learn more about what you can do with MyChart, go to [email or web address].    Your next appointment:   1 year(s)  Provider:   Gregg Taylor, MD    Other Instructions Thank you for choosing [facility]!    

## 2022-08-14 NOTE — Progress Notes (Signed)
HPI Tara Bryant returns today for followup. She is a pleasant 52 yo woman with a non-ischemic CM, chronic systolic heart failure, HTN, and obesity, s/p ICD insertion. She has gone back to work and has lost 20 lbs.  She denies chest pain or syncope or ICD shocks. No edema. She has class 2 CHF symptoms. She admits to being sedentary.  Allergies  Allergen Reactions   Aspartame And Phenylalanine Dermatitis   Current Outpatient Medications  Medication Sig Dispense Refill   fluconazole (DIFLUCAN) 150 MG tablet Take 150 mg by mouth once.     furosemide (LASIX) 20 MG tablet Take 1 tablet (20 mg total) by mouth daily. With 40 mg tablet to equal 60 mg daily. Can Do additional 20 mg daily for SOB as needed 180 tablet 3   furosemide (LASIX) 40 MG tablet Take 1 tablet (40 mg total) by mouth daily. With 20 mg tablet to equal 60 mg daily. Can Do additional 20 mg daily for SOB as needed 90 tablet 3   glipiZIDE (GLUCOTROL) 5 MG tablet Take 1 tablet (5 mg total) by mouth 2 (two) times daily before a meal. 180 tablet 3   glucose blood (CONTOUR TEST) test strip Use as instructed to monitor glucose 3 times daily as instructed 200 each 0   insulin isophane & regular human KwikPen (NOVOLIN 70/30 KWIKPEN) (70-30) 100 UNIT/ML KwikPen 75 units with breakfast and 75 units with supper if glucose is above 90 and she is eating 60 mL 3   Lancets (ONETOUCH DELICA PLUS LANCET33G) MISC USE TO TEST BLOOD SUGAR FOUR TIMES DAILY     metFORMIN (GLUCOPHAGE) 500 MG tablet Take 2 tablets (1,000 mg total) by mouth 2 (two) times daily with a meal. 360 tablet 1   nystatin (MYCOSTATIN/NYSTOP) powder Apply topically.     nystatin-triamcinolone ointment (MYCOLOG) Apply topically.     terconazole (TERAZOL 7) 0.4 % vaginal cream Place 1 applicator vaginally at bedtime.     apixaban (ELIQUIS) 5 MG TABS tablet Take 1 tablet (5 mg total) by mouth 2 (two) times daily. (Patient not taking: Reported on 08/14/2022) 180 tablet 3    atorvastatin (LIPITOR) 10 MG tablet Take 1 tablet (10 mg total) by mouth daily. (Patient not taking: Reported on 03/21/2022) 90 tablet 1   B-D ULTRAFINE III SHORT PEN 31G X 8 MM MISC USE TO INJECT INSULIN TWICE DAILY (Patient not taking: Reported on 08/14/2022) 100 each 3   metoprolol succinate (TOPROL-XL) 100 MG 24 hr tablet Take 1 tablet (100 mg total) by mouth daily. (Patient not taking: Reported on 08/14/2022) 30 tablet 6   NORLYDA 0.35 MG tablet Take 1 tablet by mouth at bedtime.  (Patient not taking: Reported on 08/14/2022)  12   potassium chloride SA (KLOR-CON M) 20 MEQ tablet Take 2 tablets (40 mEq total) by mouth daily. (Patient not taking: Reported on 08/14/2022) 60 tablet 6   sacubitril-valsartan (ENTRESTO) 97-103 MG Take 1 tablet by mouth 2 (two) times daily. (Patient not taking: Reported on 08/14/2022) 60 tablet 6   sertraline (ZOLOFT) 50 MG tablet Take 1 tablet (50 mg total) by mouth daily. (Patient not taking: Reported on 08/14/2022) 90 tablet 1   spironolactone (ALDACTONE) 25 MG tablet Take 12.5 mg by mouth daily. (Patient not taking: Reported on 08/14/2022)     No current facility-administered medications for this visit.     Past Medical History:  Diagnosis Date   AICD (automatic cardioverter/defibrillator) present    Arrhythmia  Arthritis    CAD (coronary artery disease)    a. 03/2018: cath showing 60% mid-LAD stenosis not significant by FFR.    Chronic combined systolic (congestive) and diastolic (congestive) heart failure (HCC)    a. 03/2018: found to have a newly reduced EF of 20% by echocardiogram   Depression    denies   Diabetes mellitus    GERD (gastroesophageal reflux disease)    h/o of   Hypertension    Microproteinuria    S/P thyroid biopsy 2020   follow up once a year,    Sleep apnea     ROS:   All systems reviewed and negative except as noted in the HPI.   Past Surgical History:  Procedure Laterality Date   ANTERIOR CERVICAL DECOMP/DISCECTOMY FUSION N/A  12/03/2019   Procedure: ANTERIOR CERVICAL DECOMPRESSION/DISCECTOMY FUSION CERVICAL SIX- CERVICAL SEVEN;  Surgeon: Lisbeth Renshaw, MD;  Location: [facility] OR;  Service: Neurosurgery;  Laterality: N/A;  ANTERIOR CERVICAL DECOMPRESSION/DISCECTOMY FUSION CERVICAL SIX- CERVICAL SEVEN   BACK SURGERY  2010   BREAST BIOPSY Left 2017   benign   CARDIAC CATHETERIZATION     CORONARY PRESSURE/FFR STUDY N/A 03/24/2018   Procedure: INTRAVASCULAR PRESSURE WIRE/FFR STUDY;  Surgeon: Marykay Lex, MD;  Location: [facility] INVASIVE CV LAB;  Service: Cardiovascular;  Laterality: N/A;   ICD IMPLANT N/A 10/29/2018   Procedure: ICD IMPLANT;  Surgeon: Marinus Maw, MD;  Location: [facility] INVASIVE CV LAB;  Service: Cardiovascular;  Laterality: N/A;   NOSE SURGERY     RIGHT/LEFT HEART CATH AND CORONARY ANGIOGRAPHY N/A 03/24/2018   Procedure: RIGHT/LEFT HEART CATH AND CORONARY ANGIOGRAPHY;  Surgeon: Marykay Lex, MD;  Location: [facility] INVASIVE CV LAB;  Service: Cardiovascular;  Laterality: N/A;   TONSILLECTOMY     TOOTH EXTRACTION     September 2022   WISDOM TOOTH EXTRACTION       Family History  Problem Relation Age of Onset   Breast cancer Mother    Hypertension Mother    Chronic Renal Failure Father    Stroke Sister 10       smoker   Breast cancer Sister    Diabetes Sister    Hypertension Sister    Heart attack Paternal Grandmother    Diabetes Maternal Aunt    Colon cancer Neg Hx      Social History   Socioeconomic History   Marital status: Divorced    Spouse name: Not on file   Number of children: 1   Years of education: Not on file   Highest education level: Not on file  Occupational History   Occupation: Home maker  Tobacco Use   Smoking status: Never   Smokeless tobacco: Never  Vaping Use   Vaping Use: Never used  Substance and Sexual Activity   Alcohol use: No   Drug use: No   Sexual activity: Yes    Birth control/protection: Pill  Other Topics Concern   Not on file  Social History  Narrative   Not on file   Social Determinants of Health   Financial Resource Strain: Not on file  Food Insecurity: Not on file  Transportation Needs: Not on file  Physical Activity: Not on file  Stress: Not on file  Social Connections: Not on file  Intimate Partner Violence: Not on file     BP 116/74   Pulse 95   Ht 5\' 8"  (1.727 m)   Wt 180 lb (81.6 kg)   SpO2 97%   BMI 27.37 kg/m  Physical Exam:  Well appearing NAD HEENT: Unremarkable Neck:  No JVD, no thyromegally Lymphatics:  No adenopathy Back:  No CVA tenderness Lungs:  Clear HEART:  Regular rate rhythm, no murmurs, no rubs, no clicks Abd:  soft, positive bowel sounds, no organomegally, no rebound, no guarding Ext:  2 plus pulses, no edema, no cyanosis, no clubbing Skin:  No rashes no nodules Neuro:  CN II through XII intact, motor grossly intact  EKG - nsr with NSSTT changes  DEVICE  Normal device function.  See PaceArt for details.   Assess/Plan:  1. Chronic systolic heart failure - she has class 2 symptoms. I encouraged her to increase her physical activity. 2. ICD - her [employer] single chamber ICD is working normally. We will recheck in several months. 3. HTN - her bp is up but she did not take her morning meds. I encouraged her to do so. 4. Obesity - I encouraged her to lose weight. Since she started back working her weight is down 20 lbs.   Sharlot Gowda Zhi Geier,MD

## 2022-08-19 ENCOUNTER — Ambulatory Visit: Payer: 59 | Attending: Internal Medicine

## 2022-08-19 ENCOUNTER — Telehealth: Payer: Self-pay

## 2022-08-19 DIAGNOSIS — Z95810 Presence of automatic (implantable) cardiac defibrillator: Secondary | ICD-10-CM

## 2022-08-19 DIAGNOSIS — I5022 Chronic systolic (congestive) heart failure: Secondary | ICD-10-CM

## 2022-08-19 NOTE — Progress Notes (Signed)
EPIC Encounter for ICM Monitoring  Patient Name: Tara Bryant is a 52 y.o. female Date: 08/19/2022 Primary Care Physican: Tommie Sams, DO Primary Cardiologist: Branch/Bensimhon Electrophysiologist: Ladona Ridgel 04/16/2022 Weight: 195 lbs                              Attempted call to patient and unable to reach.  Left detailed message per DPR regarding transmission. Transmission reviewed.    HeartLogic Heart Failure Index decreased 17 to 15 suggesting fluid levels returning to normal.  Thoracic impedance decreased 4/2 suggesting the start of possible fluid accumulation.   Prescribed: Furosemide 40 mg Take 60 mg by mouth daily. Can take an additional 20 mg for SOB Potassium 20 mEq Take 2 tablets (40 mEq total) by mouth daily. Spironolactone 25 mg take 0.5 tablet (12.5 mg total) by mouth daily.   Labs: 11/28/2021 Creatinine 0.96, BUN 19, Potassium 3.3, Sodium 140, GFR >60 A complete set of results can be found in Results Review.   Recommendations:  Left voice mail with ICM number and encouraged to call if experiencing any fluid symptoms.     Follow-up plan: ICM clinic phone appointment on 09/09/2022.   91 day device clinic remote transmission 11/01/2022.              EP/Cardiology next office visit:  08/14/2022 with Dr Ladona Ridgel.    Recall 05/01/2020 with Dr Gala Romney.  10/16/2021 was last visit with Dr Wyline Mood and advised to reestablish with Dr Gala Romney.          Copy of ICM check sent to Dr. Ladona Ridgel.    3 Month HeartLogicT Heart Failure Index:    8 Day Data Trend:          Karie Soda, RN 08/19/2022 4:07 PM

## 2022-08-19 NOTE — Telephone Encounter (Signed)
Remote ICM transmission received.  Attempted call to patient regarding ICM remote transmission and left detailed message per DPR.  Advised to return call for any fluid symptoms or questions. Next ICM remote transmission scheduled 09/09/2022.    

## 2022-08-26 ENCOUNTER — Telehealth: Payer: Self-pay | Admitting: Cardiology

## 2022-08-26 MED ORDER — FUROSEMIDE 40 MG PO TABS: 40.0000 mg | ORAL_TABLET | Freq: Every day | ORAL | 1 refills | Status: DC

## 2022-08-26 MED ORDER — FUROSEMIDE 20 MG PO TABS: 20.0000 mg | ORAL_TABLET | Freq: Every day | ORAL | 1 refills | Status: DC

## 2022-08-26 NOTE — Telephone Encounter (Signed)
Done

## 2022-08-26 NOTE — Telephone Encounter (Signed)
*  STAT* If patient is at the pharmacy, call can be transferred to refill team.   1. Which medications need to be refilled? (please list name of each medication and dose if known)   furosemide (LASIX) 20 MG tablet  furosemide (LASIX) 40 MG tablet   2. Which pharmacy/location (including street and city if local pharmacy) is medication to be sent to?  [id number] [place] [place] [facility] [address] AT [address]   3. Do they need a 30 day or 90 day supply?   90 day  Patient stated she has a few tablets left of these medications.

## 2022-08-27 NOTE — Progress Notes (Signed)
[facility] Quality Team Note  Name: Tara Bryant Date of Birth: 12/29/1970 MRN: [id number] Date: 08/27/2022  [facility] Quality Team has reviewed this patient's chart, please see recommendations below:  [facility] Quality Other; (Called to offer [facility] event 08/29/2022, left voicemail.)

## 2022-08-27 NOTE — Progress Notes (Signed)
Remote ICD transmission.   

## 2022-08-30 ENCOUNTER — Other Ambulatory Visit: Payer: Self-pay | Admitting: Nurse Practitioner

## 2022-09-09 ENCOUNTER — Encounter: Payer: Self-pay | Admitting: Family Medicine

## 2022-09-09 ENCOUNTER — Ambulatory Visit: Payer: 59 | Attending: Internal Medicine

## 2022-09-09 ENCOUNTER — Ambulatory Visit (INDEPENDENT_AMBULATORY_CARE_PROVIDER_SITE_OTHER): Payer: 59 | Admitting: Family Medicine

## 2022-09-09 VITALS — BP 118/76 | HR 107 | Temp 97.7°F | Ht 68.0 in | Wt 190.0 lb

## 2022-09-09 DIAGNOSIS — E1165 Type 2 diabetes mellitus with hyperglycemia: Secondary | ICD-10-CM | POA: Diagnosis not present

## 2022-09-09 DIAGNOSIS — Z794 Long term (current) use of insulin: Secondary | ICD-10-CM | POA: Diagnosis not present

## 2022-09-09 DIAGNOSIS — M79605 Pain in left leg: Secondary | ICD-10-CM | POA: Diagnosis not present

## 2022-09-09 DIAGNOSIS — I83813 Varicose veins of bilateral lower extremities with pain: Secondary | ICD-10-CM | POA: Insufficient documentation

## 2022-09-09 DIAGNOSIS — Z130 Encounter for screening for diseases of the blood and blood-forming organs and certain disorders involving the immune mechanism: Secondary | ICD-10-CM

## 2022-09-09 DIAGNOSIS — E782 Mixed hyperlipidemia: Secondary | ICD-10-CM | POA: Diagnosis not present

## 2022-09-09 DIAGNOSIS — Z91199 Patient's noncompliance with other medical treatment and regimen due to unspecified reason: Secondary | ICD-10-CM

## 2022-09-09 DIAGNOSIS — I5022 Chronic systolic (congestive) heart failure: Secondary | ICD-10-CM

## 2022-09-09 DIAGNOSIS — M79606 Pain in leg, unspecified: Secondary | ICD-10-CM | POA: Insufficient documentation

## 2022-09-09 DIAGNOSIS — Z95810 Presence of automatic (implantable) cardiac defibrillator: Secondary | ICD-10-CM

## 2022-09-09 DIAGNOSIS — M79604 Pain in right leg: Secondary | ICD-10-CM | POA: Diagnosis not present

## 2022-09-09 NOTE — Assessment & Plan Note (Signed)
Patient is noncompliant.  She is not taking her cardiac medications other than Lasix.  I advised her that she should follow-up with cardiology.  She needs echocardiogram.

## 2022-09-09 NOTE — Assessment & Plan Note (Signed)
Patient having pain in the lower extremities and is concerned about her varicose veins.  Advised compression.  Referring to vascular.

## 2022-09-09 NOTE — Assessment & Plan Note (Signed)
Patient endorsing significant pain of her lower extremities due to standing on her feet all day at work.  She has other symptoms as well and I believe that this is multifactorial.  Varicose veins certainly playing a role.  Referral has been placed to vascular.  Patient to determine whether she can continue on with her job and whether she should file for disability again.

## 2022-09-09 NOTE — Patient Instructions (Signed)
I have ordered labs.  You need to get back in with Dr. Wyline Mood. Please call.  I have placed a referral for you.  If you have trouble doing your job, discuss with HR and consider filing for disability.

## 2022-09-09 NOTE — Progress Notes (Signed)
Subjective:  [name], female    DOB: 04/08/1971  Age: 52 y.o. MRN: [id number]  CC: Chief Complaint  Patient presents with   bilateral leg pain     Visible vein thigh down legs - 2 months works on her feet     HPI:  52 year old female with hypertension, coronary disease, systolic heart failure/nonischemic cardiomyopathy, OSA, uncontrolled type 2 diabetes, hyperlipidemia, anxiety depression presents for evaluation of the above.  Patient reports that she has returned back to work.  She has not worked for many years.  She states that she is working for [employer].  She states that she is having significant difficulty at this time.  She is having pain in her lower extremities.  She has significant varicosities and she is concerned about this.  She also reports a burning sensation to the right lateral thigh.  Patient is finding it difficult to perform her job.  She is also had times where she has failed lightheaded/dizzy.  She states that she has fallen a few times.  She follows with cardiology but she has had no echocardiogram recently.  She is not compliant in regards to her cardiac medications.   Patient Active Problem List   Diagnosis Date Noted   Varicose veins of both lower extremities with pain 09/09/2022   Lower extremity pain 09/09/2022   Noncompliance 03/21/2022   CAD (coronary artery disease) 09/19/2021   Anxiety and depression 09/19/2021   OSA (obstructive sleep apnea) 09/30/2019   Chronic systolic heart failure (HCC) 02/04/2019   ICD (implantable cardioverter-defibrillator) in place 02/04/2019   Uncontrolled type 2 diabetes mellitus with hyperglycemia (HCC) 06/10/2018   Mixed hyperlipidemia 06/10/2018   Essential hypertension, benign 07/30/2012    Social Hx   Social History   Socioeconomic History   Marital status: Divorced    Spouse name: Not on file   Number of children: 1   Years of education: Not on file   Highest education level: Not on file   Occupational History   Occupation: Home maker  Tobacco Use   Smoking status: Never   Smokeless tobacco: Never  Vaping Use   Vaping Use: Never used  Substance and Sexual Activity   Alcohol use: No   Drug use: No   Sexual activity: Yes    Birth control/protection: Pill  Other Topics Concern   Not on file  Social History Narrative   Not on file   Social Determinants of Health   Financial Resource Strain: Not on file  Food Insecurity: Not on file  Transportation Needs: Not on file  Physical Activity: Not on file  Stress: Not on file  Social Connections: Not on file    Review of Systems Per HPI  Objective:  BP 118/76   Pulse (!) 107   Temp 97.7 F (36.5 C)   Ht 5\' 8"  (1.727 m)   Wt 190 lb (86.2 kg)   SpO2 98%   BMI 28.89 kg/m      09/09/2022   10:49 AM 08/14/2022   11:39 AM 03/21/2022    9:55 AM  BP/Weight  Systolic BP 118 116 121  Diastolic BP 76 74 82  Wt. (Lbs) 190 180 193  BMI 28.89 kg/m2 27.37 kg/m2 29.78 kg/m2    Physical Exam Constitutional:      General: She is not in acute distress.    Appearance: Normal appearance.  HENT:     Head: Normocephalic and atraumatic.  Cardiovascular:     Rate and Rhythm:  Tachycardia present.  Pulmonary:     Effort: Pulmonary effort is normal.     Breath sounds: Normal breath sounds. No wheezing, rhonchi or rales.  Neurological:     Mental Status: She is alert.  Psychiatric:     Comments: Anxious.      Lab Results  Component Value Date   WBC 4.5 05/03/2020   HGB 14.1 05/03/2020   HCT 40.8 05/03/2020   PLT 186 05/03/2020   GLUCOSE 77 11/28/2021   CHOL 207 (H) 10/02/2021   TRIG 148 10/02/2021   HDL 39 (L) 10/02/2021   LDLCALC 138 (H) 10/02/2021   ALT 25 10/02/2021   AST 20 10/02/2021   NA 140 11/28/2021   K 3.3 (L) 11/28/2021   CL 105 11/28/2021   CREATININE 0.96 11/28/2021   BUN 19 11/28/2021   CO2 25 11/28/2021   TSH 1.652 10/02/2021   INR 1.1 12/04/2019   HGBA1C 11.0 (A) 02/18/2022   MICROALBUR  21.8 (H) 05/12/2020     Assessment & Plan:   Problem List Items Addressed This Visit       Cardiovascular and Mediastinum   Varicose veins of both lower extremities with pain - Primary    Patient having pain in the lower extremities and is concerned about her varicose veins.  Advised compression.  Referring to vascular.      Relevant Orders   Ambulatory referral to Vascular Surgery     Endocrine   Uncontrolled type 2 diabetes mellitus with hyperglycemia [facility])    Patient needs labs.  She has had no recent A1c.  She states that she is compliant with metformin, insulin, and glipizide.  However, her previous A1c was markedly uncontrolled.  She has follow-up with endocrinology.      Relevant Orders   CMP14+EGFR   Hemoglobin A1c   Microalbumin / creatinine urine ratio     Other   Mixed hyperlipidemia   Relevant Orders   Lipid panel   Noncompliance    Patient is noncompliant.  She is not taking her cardiac medications other than Lasix.  I advised her that she should follow-up with cardiology.  She needs echocardiogram.      Lower extremity pain    Patient endorsing significant pain of her lower extremities due to standing on her feet all day at work.  She has other symptoms as well and I believe that this is multifactorial.  Varicose veins certainly playing a role.  Referral has been placed to vascular.  Patient to determine whether she can continue on with her job and whether she should file for disability again.      Other Visit Diagnoses     Screening for deficiency anemia       Relevant Orders   CBC       Shantinique Picazo Adriana Simas DO [facility]

## 2022-09-09 NOTE — Assessment & Plan Note (Signed)
Patient needs labs.  She has had no recent A1c.  She states that she is compliant with metformin, insulin, and glipizide.  However, her previous A1c was markedly uncontrolled.  She has follow-up with endocrinology.

## 2022-09-11 NOTE — Progress Notes (Signed)
EPIC Encounter for ICM Monitoring  Patient Name: Tara Bryant is a 52 y.o. female Date: 09/11/2022 Primary Care Physican: Tommie Sams, DO Primary Cardiologist: Branch/Bensimhon Electrophysiologist: Ladona Ridgel 04/16/2022 Weight: 195 lbs                              Transmission fluid levels reviewed.    HeartLogic Heart Failure Index 6 suggesting fluid levels within normal threshold.   Prescribed: Furosemide 40 mg Take 60 mg by mouth daily. Can take an additional 20 mg for SOB Potassium 20 mEq Take 2 tablets (40 mEq total) by mouth daily. Spironolactone 25 mg take 0.5 tablet (12.5 mg total) by mouth daily.   Labs: 11/28/2021 Creatinine 0.96, BUN 19, Potassium 3.3, Sodium 140, GFR >60 A complete set of results can be found in Results Review.   Recommendations:  No changes   Follow-up plan: ICM clinic phone appointment on 10/14/2022.   91 day device clinic remote transmission 11/01/2022.              EP/Cardiology next office visit:  Recall 08/09/2023 with Dr Ladona Ridgel.    Recall 05/01/2020 with Dr Gala Romney.  10/16/2021 was last visit with Dr Wyline Mood and advised to reestablish with Dr Gala Romney (Recall 05/01/2020) .          Copy of ICM check sent to Dr. Ladona Ridgel.    3 Month HeartLogicT Heart Failure Index:    8 Day Data Trend:          Karie Soda, RN 09/11/2022 8:16 AM

## 2022-09-16 ENCOUNTER — Encounter: Payer: Self-pay | Admitting: Nurse Practitioner

## 2022-09-16 ENCOUNTER — Other Ambulatory Visit (HOSPITAL_COMMUNITY)
Admission: RE | Admit: 2022-09-16 | Discharge: 2022-09-16 | Disposition: A | Discharge status: Home / Self Care | Payer: 59 | Source: Ambulatory Visit | Attending: Family Medicine | Admitting: Family Medicine

## 2022-09-16 ENCOUNTER — Ambulatory Visit (INDEPENDENT_AMBULATORY_CARE_PROVIDER_SITE_OTHER): Payer: 59 | Admitting: Nurse Practitioner

## 2022-09-16 VITALS — BP 122/78 | HR 68 | Ht 68.0 in | Wt 193.6 lb

## 2022-09-16 DIAGNOSIS — E1165 Type 2 diabetes mellitus with hyperglycemia: Secondary | ICD-10-CM | POA: Diagnosis not present

## 2022-09-16 DIAGNOSIS — Z91199 Patient's noncompliance with other medical treatment and regimen due to unspecified reason: Secondary | ICD-10-CM

## 2022-09-16 DIAGNOSIS — E559 Vitamin D deficiency, unspecified: Secondary | ICD-10-CM | POA: Diagnosis not present

## 2022-09-16 DIAGNOSIS — Z91148 Patient's other noncompliance with medication regimen for other reason: Secondary | ICD-10-CM

## 2022-09-16 DIAGNOSIS — Z7984 Long term (current) use of oral hypoglycemic drugs: Secondary | ICD-10-CM

## 2022-09-16 DIAGNOSIS — Z130 Encounter for screening for diseases of the blood and blood-forming organs and certain disorders involving the immune mechanism: Secondary | ICD-10-CM | POA: Insufficient documentation

## 2022-09-16 DIAGNOSIS — Z794 Long term (current) use of insulin: Secondary | ICD-10-CM

## 2022-09-16 DIAGNOSIS — I10 Essential (primary) hypertension: Secondary | ICD-10-CM | POA: Diagnosis not present

## 2022-09-16 DIAGNOSIS — E782 Mixed hyperlipidemia: Secondary | ICD-10-CM | POA: Diagnosis not present

## 2022-09-16 LAB — COMPREHENSIVE METABOLIC PANEL
ALT: 30 U/L (ref 0–44)
AST: 21 U/L (ref 15–41)
Albumin: 3.8 g/dL (ref 3.5–5.0)
Alkaline Phosphatase: 72 U/L (ref 38–126)
Anion gap: 10 (ref 5–15)
BUN: 14 mg/dL (ref 6–20)
CO2: 24 mmol/L (ref 22–32)
Calcium: 8.5 mg/dL — ABNORMAL LOW (ref 8.9–10.3)
Chloride: 101 mmol/L (ref 98–111)
Creatinine, Ser: 0.78 mg/dL (ref 0.44–1.00)
GFR, Estimated: >60 mL/min (ref >60)
Glucose, Bld: 350 mg/dL — ABNORMAL HIGH (ref 70–99)
Potassium: 3.9 mmol/L (ref 3.5–5.1)
Sodium: 135 mmol/L (ref 135–145)
Total Bilirubin: 0.6 mg/dL (ref 0.3–1.2)
Total Protein: 7.4 g/dL (ref 6.5–8.1)

## 2022-09-16 LAB — LIPID PANEL
Cholesterol: 194 mg/dL (ref 0–200)
HDL: 39 mg/dL — ABNORMAL LOW (ref >40)
LDL Cholesterol: 133 mg/dL — ABNORMAL HIGH (ref 0–99)
Total CHOL/HDL Ratio: 5 RATIO
Triglycerides: 111 mg/dL (ref <150)
VLDL: 22 mg/dL (ref 0–40)

## 2022-09-16 LAB — CBC
HCT: 38.1 % (ref 36.0–46.0)
Hemoglobin: 13.3 g/dL (ref 12.0–15.0)
MCH: 32 pg (ref 26.0–34.0)
MCHC: 34.9 g/dL (ref 30.0–36.0)
MCV: 91.6 fL (ref 80.0–100.0)
Platelets: 221 10*3/uL (ref 150–400)
RBC: 4.16 MIL/uL (ref 3.87–5.11)
RDW: 12.9 % (ref 11.5–15.5)
WBC: 7.1 10*3/uL (ref 4.0–10.5)
nRBC: 0 % (ref 0.0–0.2)

## 2022-09-16 LAB — HEMOGLOBIN A1C
Hgb A1c MFr Bld: 9.4 % — ABNORMAL HIGH (ref 4.8–5.6)
Mean Plasma Glucose: 223.08 mg/dL

## 2022-09-16 MED ORDER — NOVOLIN 70/30 FLEXPEN (70-30) 100 UNIT/ML ~~LOC~~ SUPN
75.0000 [IU] | PEN_INJECTOR | Freq: Two times a day (BID) | SUBCUTANEOUS | 3 refills | Status: DC
Start: 2022-09-16 — End: 2023-01-13

## 2022-09-16 MED ORDER — GLIPIZIDE ER 5 MG PO TB24: 5.0000 mg | ORAL_TABLET | Freq: Every day | ORAL | 1 refills | Status: DC

## 2022-09-16 MED ORDER — METFORMIN HCL ER 500 MG PO TB24: 1000.0000 mg | ORAL_TABLET | Freq: Every day | ORAL | 1 refills | Status: DC

## 2022-09-16 NOTE — Progress Notes (Signed)
09/16/2022, 8:30 AM  Endocrinology follow-up note                              Subjective:    [name], female    DOB: 04-22-1970.  Tara Bryant is being seen in follow-up in  management of currently uncontrolled symptomatic diabetes requested by  Tommie Sams, DO.   Past Medical History:  Diagnosis Date   AICD (automatic cardioverter/defibrillator) present    Arrhythmia    Arthritis    CAD (coronary artery disease)    a. 03/2018: cath showing 60% mid-LAD stenosis not significant by FFR.    Chronic combined systolic (congestive) and diastolic (congestive) heart failure (HCC)    a. 03/2018: found to have a newly reduced EF of 20% by echocardiogram   Depression    denies   Diabetes mellitus    GERD (gastroesophageal reflux disease)    h/o of   Hypertension    Microproteinuria    S/P thyroid biopsy 2020   follow up once a year,    Sleep apnea    Past Surgical History:  Procedure Laterality Date   ANTERIOR CERVICAL DECOMP/DISCECTOMY FUSION N/A 12/03/2019   Procedure: ANTERIOR CERVICAL DECOMPRESSION/DISCECTOMY FUSION CERVICAL SIX- CERVICAL SEVEN;  Surgeon: Lisbeth Renshaw, MD;  Location: [facility] OR;  Service: Neurosurgery;  Laterality: N/A;  ANTERIOR CERVICAL DECOMPRESSION/DISCECTOMY FUSION CERVICAL SIX- CERVICAL SEVEN   BACK SURGERY  2010   BREAST BIOPSY Left 2017   benign   CARDIAC CATHETERIZATION     CORONARY PRESSURE/FFR STUDY N/A 03/24/2018   Procedure: INTRAVASCULAR PRESSURE WIRE/FFR STUDY;  Surgeon: Marykay Lex, MD;  Location: [facility] INVASIVE CV LAB;  Service: Cardiovascular;  Laterality: N/A;   ICD IMPLANT N/A 10/29/2018   Procedure: ICD IMPLANT;  Surgeon: Marinus Maw, MD;  Location: [facility] INVASIVE CV LAB;  Service: Cardiovascular;  Laterality: N/A;   NOSE SURGERY     RIGHT/LEFT HEART CATH AND CORONARY ANGIOGRAPHY N/A 03/24/2018   Procedure: RIGHT/LEFT HEART CATH AND  CORONARY ANGIOGRAPHY;  Surgeon: Marykay Lex, MD;  Location: [facility] INVASIVE CV LAB;  Service: Cardiovascular;  Laterality: N/A;   TONSILLECTOMY     TOOTH EXTRACTION     September 2022   WISDOM TOOTH EXTRACTION     Social History   Socioeconomic History   Marital status: Divorced    Spouse name: Not on file   Number of children: 1   Years of education: Not on file   Highest education level: Not on file  Occupational History   Occupation: Home maker  Tobacco Use   Smoking status: Never   Smokeless tobacco: Never  Vaping Use   Vaping Use: Never used  Substance and Sexual Activity   Alcohol use: No   Drug use: No   Sexual activity: Yes    Birth control/protection: Pill  Other Topics Concern   Not on file  Social History Narrative   Not on file   Social Determinants of Health   Financial Resource Strain: Not on file  Food Insecurity: Not on file  Transportation Needs: Not on file  Physical  Activity: Not on file  Stress: Not on file  Social Connections: Not on file   Outpatient Encounter Medications as of 09/16/2022  Medication Sig   atorvastatin (LIPITOR) 10 MG tablet Take 1 tablet (10 mg total) by mouth daily.   B-D ULTRAFINE III SHORT PEN 31G X 8 MM MISC USE TO INJECT INSULIN TWICE DAILY   furosemide (LASIX) 20 MG tablet Take 1 tablet (20 mg total) by mouth daily. With 40 mg tablet to equal 60 mg daily. Can Do additional 20 mg daily for SOB as needed   furosemide (LASIX) 40 MG tablet Take 1 tablet (40 mg total) by mouth daily. With 20 mg tablet to equal 60 mg daily. Can Do additional 20 mg daily for SOB as needed   glipiZIDE (GLUCOTROL XL) 5 MG 24 hr tablet Take 1 tablet (5 mg total) by mouth daily with breakfast.   glucose blood (CONTOUR TEST) test strip Use as instructed to monitor glucose 3 times daily as instructed   Lancets (ONETOUCH DELICA PLUS LANCET33G) MISC USE TO TEST BLOOD SUGAR FOUR TIMES DAILY   metFORMIN (GLUCOPHAGE-XR) 500 MG 24 hr tablet Take 2 tablets  (1,000 mg total) by mouth daily with breakfast.   metoprolol succinate (TOPROL-XL) 100 MG 24 hr tablet Take 1 tablet (100 mg total) by mouth daily.   potassium chloride SA (KLOR-CON M) 20 MEQ tablet Take 2 tablets (40 mEq total) by mouth daily.   sacubitril-valsartan (ENTRESTO) 97-103 MG Take 1 tablet by mouth 2 (two) times daily.   sertraline (ZOLOFT) 50 MG tablet Take 1 tablet (50 mg total) by mouth daily.   spironolactone (ALDACTONE) 25 MG tablet Take 12.5 mg by mouth daily.   [DISCONTINUED] glipiZIDE (GLUCOTROL) 5 MG tablet Take 1 tablet (5 mg total) by mouth 2 (two) times daily before a meal.   [DISCONTINUED] insulin isophane & regular human KwikPen (NOVOLIN 70/30 KWIKPEN) (70-30) 100 UNIT/ML KwikPen 75 units with breakfast and 75 units with supper if glucose is above 90 and she is eating   [DISCONTINUED] metFORMIN (GLUCOPHAGE) 500 MG tablet TAKE 2 TABLETS (1,000 MG TOTAL) BY MOUTH 2 (TWO) TIMES DAILY WITH A MEAL.   fluconazole (DIFLUCAN) 150 MG tablet Take 150 mg by mouth once. (Patient not taking: Reported on 09/16/2022)   insulin isophane & regular human KwikPen (NOVOLIN 70/30 KWIKPEN) (70-30) 100 UNIT/ML KwikPen Inject 75 Units into the skin 2 (two) times daily before a meal. 75 units with breakfast and 75 units with supper if glucose is above 90 and she is eating   No facility-administered encounter medications on file as of 09/16/2022.    ALLERGIES: Allergies  Allergen Reactions   Aspartame And Phenylalanine Dermatitis    VACCINATION STATUS: Immunization History  Administered Date(s) Administered   Influenza Split 02/02/2013   Influenza,inj,Quad PF,6+ Mos 01/11/2014, 12/15/2014, 12/15/2015, 01/03/2017, 01/28/2018, 02/03/2019, 02/17/2020, 02/16/2021, 12/28/2021   Influenza-Unspecified 03/08/2011   Pneumococcal Polysaccharide-23 03/30/2009   Td 02/04/2006   Tdap 02/26/2018    Diabetes She presents for her follow-up diabetic visit. She has type 2 diabetes mellitus. Onset time:  She was diagnosed at approximate age of 29 years. There are no hypoglycemic associated symptoms. Pertinent negatives for hypoglycemia include no confusion, headaches, pallor or seizures. Associated symptoms include blurred vision, fatigue and visual change. Pertinent negatives for diabetes include no chest pain, no polydipsia, no polyphagia, no polyuria and no weight loss. There are no hypoglycemic complications. Symptoms are stable. Diabetic complications include heart disease. Risk factors for coronary artery disease include diabetes  mellitus, dyslipidemia, hypertension, family history, sedentary lifestyle and stress. Current diabetic treatment includes insulin injections and oral agent (dual therapy). She is compliant with treatment some of the time (often missed her second dose of Metformin and Glipizide and insulin). Her weight is increasing steadily. She is following a generally unhealthy diet. When asked about meal planning, she reported none. She has had a previous visit with a dietitian. She never participates in exercise. Her overall blood glucose range is >200 mg/dl. (She presents today with 2 meters (one of which had no readings) showing gross hyperglycemia overall.  Her POCT A1c today is 9.7%, improving from last visit of 11%.  She still forgets to take her second dose of diabetes meds (Glipizide, Metformin, and insulin).  Analysis of her meter shows 90-day average of 258 with 50 readings (no readings recently to review).) An ACE inhibitor/angiotensin II receptor blocker is being taken. She does not see a podiatrist.Eye exam is current.  Hyperlipidemia This is a chronic problem. The current episode started more than 1 year ago. The problem is uncontrolled. Recent lipid tests were reviewed and are high. Exacerbating diseases include diabetes. Factors aggravating her hyperlipidemia include beta blockers and fatty foods. Pertinent negatives include no chest pain, myalgias or shortness of breath. Current  antihyperlipidemic treatment includes statins. Compliance problems include adherence to diet and adherence to exercise.  Risk factors for coronary artery disease include dyslipidemia, diabetes mellitus, hypertension, a sedentary lifestyle and family history.  Hypertension This is a chronic problem. The current episode started more than 1 year ago. The problem has been gradually improving since onset. The problem is controlled. Associated symptoms include blurred vision. Pertinent negatives include no chest pain, headaches, palpitations or shortness of breath. There are no associated agents to hypertension. Risk factors for coronary artery disease include diabetes mellitus, dyslipidemia, family history and sedentary lifestyle. Past treatments include beta blockers, diuretics and angiotensin blockers. The current treatment provides significant improvement. There are no compliance problems.  Hypertensive end-organ damage includes CAD/MI and heart failure.     Review of systems  Constitutional: + steadily increasing body weight,  current Body mass index is 29.44 kg/m. , no fatigue, no subjective hyperthermia, no subjective hypothermia Eyes: no blurry vision, no xerophthalmia ENT: no sore throat, no nodules palpated in throat, no dysphagia/odynophagia, no hoarseness Cardiovascular: no chest pain, some intermittent SOB r/t CHF, no palpitations, no leg swelling Respiratory: no cough, some intermittent shortness of breath r/t CHF Gastrointestinal: no nausea/vomiting/diarrhea Musculoskeletal: no muscle/joint aches Skin: no rashes, no hyperemia,  Neurological: no tremors, no numbness, no tingling, no dizziness Psychiatric: + depression, + intermittent anxiety, has had great amount of stress   Objective:    BP 122/78 (BP Location: Right Arm, Patient Position: Sitting, Cuff Size: Large)   Pulse 68   Ht 5\' 8"  (1.727 m)   Wt 193 lb 9.6 oz (87.8 kg)   BMI 29.44 kg/m   Wt Readings from Last 3  Encounters:  09/16/22 193 lb 9.6 oz (87.8 kg)  09/09/22 190 lb (86.2 kg)  08/14/22 180 lb (81.6 kg)    BP Readings from Last 3 Encounters:  09/16/22 122/78  09/09/22 118/76  08/14/22 116/74     Physical Exam- Limited  Constitutional:  Body mass index is 29.44 kg/m. , not in acute distress, anxious, depressed state of mind, avoids eye contact Eyes:  EOMI, no exophthalmos Musculoskeletal: no gross deformities, strength intact in all four extremities, no gross restriction of joint movements Skin:  no rashes, no hyperemia  Neurological: no tremor with outstretched hands    CMP ( most recent) CMP     Component Value Date/Time   NA 140 11/28/2021 1256   NA 138 04/03/2018 1240   K 3.3 (L) 11/28/2021 1256   CL 105 11/28/2021 1256   CO2 25 11/28/2021 1256   GLUCOSE 77 11/28/2021 1256   BUN 19 11/28/2021 1256   BUN 20 04/03/2018 1240   CREATININE 0.96 11/28/2021 1256   CREATININE 0.74 04/06/2014 0934   CALCIUM 9.2 11/28/2021 1256   PROT 7.3 10/02/2021 1034   PROT 6.5 03/26/2017 0916   ALBUMIN 3.9 10/02/2021 1034   ALBUMIN 4.0 03/26/2017 0916   AST 20 10/02/2021 1034   ALT 25 10/02/2021 1034   ALKPHOS 63 10/02/2021 1034   BILITOT 0.8 10/02/2021 1034   BILITOT 0.4 03/26/2017 0916   GFRNONAA >60 11/28/2021 1256   GFRAA >60 01/06/2020 1053     Diabetic Labs (most recent): Lab Results  Component Value Date   HGBA1C 11.0 (A) 02/18/2022   HGBA1C 9.9 10/15/2021   HGBA1C 9.7 (A) 07/10/2021   MICROALBUR 21.8 (H) 05/12/2020   MICROALBUR 33.1 (H) 09/29/2018   MICROALBUR 4.3 (H) 04/06/2014     Lipid Panel ( most recent) Lipid Panel     Component Value Date/Time   CHOL 207 (H) 10/02/2021 1034   CHOL 202 (H) 03/26/2017 0916   TRIG 148 10/02/2021 1034   HDL 39 (L) 10/02/2021 1034   HDL 45 03/26/2017 0916   CHOLHDL 5.3 10/02/2021 1034   VLDL 30 10/02/2021 1034   LDLCALC 138 (H) 10/02/2021 1034   LDLCALC 134 (H) 03/26/2017 0916      Assessment & Plan:   1)  Uncontrolled type 2 diabetes mellitus with hyperglycemia (HCC)  - Tara Bryant has currently uncontrolled symptomatic type 2 DM since 52 years of age.  She presents today with 2 meters (one of which had no readings) showing gross hyperglycemia overall.  Her POCT A1c today is 9.7%, improving from last visit of 11%.  She still forgets to take her second dose of diabetes meds (Glipizide, Metformin, and insulin).  Analysis of her meter shows 90-day average of 258 with 50 readings (no readings recently to review).  her diabetes is complicated by coronary artery disease/CHF and she remains at a high risk for more acute and chronic complications which include CAD, CVA, CKD, retinopathy, and neuropathy. These are all discussed in detail with her.  - Nutritional counseling repeated at each appointment due to patients tendency to fall back in to old habits.  - The patient admits there is a room for improvement in their diet and drink choices. -  Suggestion is made for the patient to avoid simple carbohydrates from their diet including Cakes, Sweet Desserts / Pastries, Ice Cream, Soda (diet and regular), Sweet Tea, Candies, Chips, Cookies, Sweet Pastries, Store Bought Juices, Alcohol in Excess of 1-2 drinks a day, Artificial Sweeteners, Coffee Creamer, and "Sugar-free" Products. This will help patient to have stable blood glucose profile and potentially avoid unintended weight gain.   - I encouraged the patient to switch to unprocessed or minimally processed complex starch and increased protein intake (animal or plant source), fruits, and vegetables.   - Patient is advised to stick to a routine mealtimes to eat 3 meals a day and avoid unnecessary snacks (to snack only to correct hypoglycemia).  - I have approached her with the following individualized plan to manage diabetes and patient agrees:   -She is advised  to continue current regimen of Novolog 70/30 75 units with breakfast and 75 units with  supper if glucose is above 90 and she is eating but to be more consistent with taking them.  I did change her Metformin and Glipizide to ER form to help improve compliance.  Metformin 1000 mg ER daily after breakfast and GLipizide 5 mg XL daily at breakfast.  -She is urged to continue monitoring blood glucose at least three times per day, before injecting insulin (at breakfast and supper) and before bed.  She says she cannot afford the copay for test strips, she has not called her insurance to see what meter and strips they prefer.  - Patient specific target  A1c; LDL, HDL, Triglycerides, were discussed in detail.  2) Blood Pressure /Hypertension: Her blood pressure is controlled to target.  She is advised to continue Lasix 60 mg po daily, Metoprolol 200 mg po daily, and Spironolactone 12.5 mg daily.  She sees Dr. Gala Romney for CHF.  3) Lipids/Hyperlipidemia:  Her most recent lipid panel from 10/02/21 shows uncontrolled LDL of 138.  She is advised to continue Lipitor 10 mg po daily at bedtime.  Side effects and precautions discussed with her.    4)  Weight/Diet:  Her Body mass index is 29.44 kg/m.-she is a candidate for mild weight loss.  I discussed with her the fact that loss of 5 - 10% of her  current body weight will have the most impact on her diabetes management.  CDE Consult will be initiated . Exercise, and detailed carbohydrates information provided  -  detailed on discharge instructions.  5) Chronic Care/Health Maintenance: -she is on ACEI/ARB medications and statin medications and is encouraged to initiate and continue to follow up with Ophthalmology, Dentist,  Podiatrist at least yearly or according to recommendations, and advised to  stay away from smoking. I have recommended yearly flu vaccine and pneumonia vaccine at least every 5 years; moderate intensity exercise for up to 150 minutes weekly; and  sleep for at least 7 hours a day.  - she is advised to maintain close follow up with  Tommie Sams, DO for primary care needs, as well as her other providers for optimal and coordinated care.     I spent  34  minutes in the care of the patient today including review of labs from CMP, Lipids, Thyroid Function, Hematology (current and previous including abstractions from other facilities); face-to-face time discussing  her blood glucose readings/logs, discussing hypoglycemia and hyperglycemia episodes and symptoms, medications doses, her options of short and long term treatment based on the latest standards of care / guidelines;  discussion about incorporating lifestyle medicine;  and documenting the encounter. Risk reduction counseling performed per USPSTF guidelines to reduce obesity and cardiovascular risk factors.     Please refer to Patient Instructions for Blood Glucose Monitoring and Insulin/Medications Dosing Guide"  in media tab for additional information. Please  also refer to " Patient Self Inventory" in the Media  tab for reviewed elements of pertinent patient history.  Tara Bryant participated in the discussions, expressed understanding, and voiced agreement with the above plans.  All questions were answered to her satisfaction. she is encouraged to contact clinic should she have any questions or concerns prior to her return visit.    Follow up plan: - Return in about 3 months (around 12/17/2022) for Diabetes F/U with A1c in office, No previsit labs, Bring meter and logs.  Tara Bryant, [facility] [phone]  [address] [place], [place] [address] Phone: [phone] Fax: [phone]  09/16/2022, 8:30 AM

## 2022-09-17 LAB — MICROALBUMIN / CREATININE URINE RATIO
Creatinine, Urine: 155.7 mg/dL
Microalb Creat Ratio: 8 mg/g creat (ref 0–29)
Microalb, Ur: 13 ug/mL — ABNORMAL HIGH

## 2022-09-20 ENCOUNTER — Ambulatory Visit: Payer: 59 | Admitting: Family Medicine

## 2022-09-23 ENCOUNTER — Telehealth: Payer: Self-pay

## 2022-09-23 NOTE — Telephone Encounter (Signed)
Return call to patient as requested by voice mail message.  She stated she works Tues-Fridays and was unable to speak with me earlier this month.  She was asking how the most recent report was and if there was any fluid.  6/3 & 6/10 remote transmission reviewed and all questions answered.  Advised next ICM remote transmission scheduled 7/8.

## 2022-10-02 ENCOUNTER — Other Ambulatory Visit: Payer: Self-pay

## 2022-10-02 DIAGNOSIS — E782 Mixed hyperlipidemia: Secondary | ICD-10-CM

## 2022-10-02 MED ORDER — ATORVASTATIN CALCIUM 10 MG PO TABS
10.0000 mg | ORAL_TABLET | Freq: Every day | ORAL | 1 refills | Status: DC
Start: 2022-10-02 — End: 2023-03-11

## 2022-10-14 ENCOUNTER — Ambulatory Visit: Payer: 59 | Attending: Internal Medicine

## 2022-10-14 ENCOUNTER — Other Ambulatory Visit: Payer: Self-pay | Admitting: *Deleted

## 2022-10-14 DIAGNOSIS — Z95810 Presence of automatic (implantable) cardiac defibrillator: Secondary | ICD-10-CM | POA: Diagnosis not present

## 2022-10-14 DIAGNOSIS — I5022 Chronic systolic (congestive) heart failure: Secondary | ICD-10-CM | POA: Diagnosis not present

## 2022-10-14 DIAGNOSIS — I83813 Varicose veins of bilateral lower extremities with pain: Secondary | ICD-10-CM

## 2022-10-14 NOTE — Progress Notes (Signed)
EPIC Encounter for ICM Monitoring  Patient Name: Tara Bryant is a 52 y.o. female Date: 10/14/2022 Primary Care Physican: Tommie Sams, DO Primary Cardiologist: Branch/Bensimhon Electrophysiologist: Ladona Ridgel 04/16/2022 Weight: 195 lbs 09/16/2022 Office Weight: 193 lbs                              Spoke with patient and heart failure questions reviewed.  Transmission results reviewed.  Pt asymptomatic for fluid accumulation.  Reports feeling well at this time and voices no complaints.  She is unsure what may be causing fluid accumulation and confirmed she is taking Furosemide as prescribed.    HeartLogic Heart Failure Index 29 suggesting possible fluid accumulation starting 6/22.   Prescribed: Furosemide 40 mg Take 60 mg by mouth daily. Can take an additional 20 mg for SOB Potassium 20 mEq Take 2 tablets (40 mEq total) by mouth daily. Spironolactone 25 mg take 0.5 tablet (12.5 mg total) by mouth daily.   Labs: 11/28/2021 Creatinine 0.96, BUN 19, Potassium 3.3, Sodium 140, GFR >60 A complete set of results can be found in Results Review.   Recommendations:  Advised to take extra Furosemide 20 mg x 1-2 days and then returned to prescribed dosage.    Follow-up plan: ICM clinic phone appointment on 10/22/2022 to recheck fluid levels.   91 day device clinic remote transmission 11/01/2022.              EP/Cardiology next office visit:  Recall 08/09/2023 with Dr Ladona Ridgel.    Recall 05/01/2020 with Dr Gala Romney.  10/16/2021 was last visit with Dr Wyline Mood and advised to reestablish with Dr Gala Romney (Recall 05/01/2020) .          Copy of ICM check sent to Dr. Ladona Ridgel.  Copy sent to Dr Wyline Mood as Lorain Childes.    3 month ICM trend: 10/14/2022.     16 day trend:     Karie Soda, RN 10/14/2022 3:21 PM

## 2022-10-22 ENCOUNTER — Telehealth: Payer: Self-pay

## 2022-10-22 ENCOUNTER — Ambulatory Visit: Payer: 59 | Attending: Internal Medicine

## 2022-10-22 DIAGNOSIS — I5022 Chronic systolic (congestive) heart failure: Secondary | ICD-10-CM

## 2022-10-22 DIAGNOSIS — Z95810 Presence of automatic (implantable) cardiac defibrillator: Secondary | ICD-10-CM

## 2022-10-22 NOTE — Progress Notes (Signed)
EPIC Encounter for ICM Monitoring  Patient Name: Tara Bryant is a 52 y.o. female Date: 10/22/2022 Primary Care Physican: Tommie Sams, DO Primary Cardiologist: Branch/Bensimhon Electrophysiologist: Ladona Ridgel 04/16/2022 Weight: 195 lbs 09/16/2022 Office Weight: 193 lbs                              Attempted call to patient and unable to reach.  Left detailed message per DPR regarding transmission. Transmission reviewed.  Pt usually works 10 hour days and off on Mondays.    HeartLogic Heart Failure Index 29 suggesting possible fluid accumulation continues (after taking extra Lasix x 2 days) starting 6/22.  Thoracic impedance trending low.    Prescribed: Furosemide 40 mg Take 60 mg by mouth daily. Can take an additional 20 mg for SOB Potassium 20 mEq Take 2 tablets (40 mEq total) by mouth daily. Spironolactone 25 mg take 0.5 tablet (12.5 mg total) by mouth daily.   Labs: 11/28/2021 Creatinine 0.96, BUN 19, Potassium 3.3, Sodium 140, GFR >60 A complete set of results can be found in Results Review.   Recommendations:  Left voice mail with ICM number with message to take the extra 20 mg Furosemide daily x 3 days with 1 extra Potassium tablet x 3 days only.  Return to prescribed dosages of Lasix and Potassium after the 3rd day.  Advised to call back with any questions  Follow-up plan: ICM clinic phone appointment on 10/28/2022 to recheck fluid levels.   91 day device clinic remote transmission 11/01/2022.              EP/Cardiology next office visit:  Recall 08/09/2023 with Dr Ladona Ridgel.    Recall 05/01/2020 with Dr Gala Romney.  10/16/2021 was last visit with Dr Wyline Mood and advised to reestablish with Dr Gala Romney (Recall 05/01/2020) .          Copy of ICM check sent to Dr. Ladona Ridgel.  Copy sent to Dr Wyline Mood as Lorain Childes.    3 Month HeartLogicT Heart Failure Index:    8 Day Data Trend:          Karie Soda, RN 10/22/2022 9:28 AM

## 2022-10-22 NOTE — Telephone Encounter (Signed)
Remote ICM transmission received.  Attempted call to patient regarding ICM remote transmission and left detailed message per DPR with ICM phone number to return call.    

## 2022-10-28 ENCOUNTER — Ambulatory Visit (HOSPITAL_COMMUNITY)
Admission: RE | Admit: 2022-10-28 | Discharge: 2022-10-28 | Disposition: A | Discharge status: Home / Self Care | Payer: 59 | Source: Ambulatory Visit | Attending: Surgery | Admitting: Surgery

## 2022-10-28 ENCOUNTER — Ambulatory Visit (INDEPENDENT_AMBULATORY_CARE_PROVIDER_SITE_OTHER): Payer: 59 | Admitting: Physician Assistant

## 2022-10-28 ENCOUNTER — Ambulatory Visit: Payer: 59 | Attending: Internal Medicine

## 2022-10-28 VITALS — BP 129/82 | HR 90 | Temp 97.8°F | Resp 16 | Ht 68.0 in | Wt 193.0 lb

## 2022-10-28 DIAGNOSIS — I872 Venous insufficiency (chronic) (peripheral): Secondary | ICD-10-CM

## 2022-10-28 DIAGNOSIS — Z95810 Presence of automatic (implantable) cardiac defibrillator: Secondary | ICD-10-CM

## 2022-10-28 DIAGNOSIS — I5022 Chronic systolic (congestive) heart failure: Secondary | ICD-10-CM

## 2022-10-28 DIAGNOSIS — I83813 Varicose veins of bilateral lower extremities with pain: Secondary | ICD-10-CM | POA: Diagnosis not present

## 2022-10-28 NOTE — Progress Notes (Deleted)
[facility] [place]   Reason for referral: Swollen B legs  History of Present Illness  Tara Bryant is a 52 y.o. female who presents with chief complaint: swollen leg.  Patient notes, onset of swelling  progressive for years, associated with prolonged standing for work.  The patient has had no history of DVT, positive history of varicose vein, no history of venous stasis ulcers, no history of  Lymphedema and no history of skin changes in lower legs.  There is positive family history of venous disorders.  The patient has not used compression stockings in the past.  She states she first had LE edema when she was pregnant and this resolved.  She has developed varicose veins over the years in B LE's.  She has medical history of CHF and is being managed with Lasix and spironolactone.     Past Medical History:  Diagnosis Date   AICD (automatic cardioverter/defibrillator) present    Arrhythmia    Arthritis    CAD (coronary artery disease)    a. 03/2018: cath showing 60% mid-LAD stenosis not significant by FFR.    Chronic combined systolic (congestive) and diastolic (congestive) heart failure (HCC)    a. 03/2018: found to have a newly reduced EF of 20% by echocardiogram   Depression    denies   Diabetes mellitus    GERD (gastroesophageal reflux disease)    h/o of   Hypertension    Microproteinuria    S/P thyroid biopsy 2020   follow up once a year,    Sleep apnea     Past Surgical History:  Procedure Laterality Date   ANTERIOR CERVICAL DECOMP/DISCECTOMY FUSION N/A 12/03/2019   Procedure: ANTERIOR CERVICAL DECOMPRESSION/DISCECTOMY FUSION CERVICAL SIX- CERVICAL SEVEN;  Surgeon: Lisbeth Renshaw, MD;  Location: [facility] OR;  Service: Neurosurgery;  Laterality: N/A;  ANTERIOR CERVICAL DECOMPRESSION/DISCECTOMY FUSION CERVICAL SIX- CERVICAL SEVEN   BACK SURGERY  2010   BREAST BIOPSY Left 2017   benign   CARDIAC CATHETERIZATION     CORONARY PRESSURE/FFR STUDY N/A  03/24/2018   Procedure: INTRAVASCULAR PRESSURE WIRE/FFR STUDY;  Surgeon: Marykay Lex, MD;  Location: [facility] INVASIVE CV LAB;  Service: Cardiovascular;  Laterality: N/A;   ICD IMPLANT N/A 10/29/2018   Procedure: ICD IMPLANT;  Surgeon: Marinus Maw, MD;  Location: [facility] INVASIVE CV LAB;  Service: Cardiovascular;  Laterality: N/A;   NOSE SURGERY     RIGHT/LEFT HEART CATH AND CORONARY ANGIOGRAPHY N/A 03/24/2018   Procedure: RIGHT/LEFT HEART CATH AND CORONARY ANGIOGRAPHY;  Surgeon: Marykay Lex, MD;  Location: [facility] INVASIVE CV LAB;  Service: Cardiovascular;  Laterality: N/A;   TONSILLECTOMY     TOOTH EXTRACTION     September 2022   WISDOM TOOTH EXTRACTION      Social History   Socioeconomic History   Marital status: Divorced    Spouse name: Not on file   Number of children: 1   Years of education: Not on file   Highest education level: Not on file  Occupational History   Occupation: Home maker  Tobacco Use   Smoking status: Never   Smokeless tobacco: Never  Vaping Use   Vaping status: Never Used  Substance and Sexual Activity   Alcohol use: No   Drug use: No   Sexual activity: Yes    Birth control/protection: Pill  Other Topics Concern   Not on file  Social History Narrative   Not on file   Social Determinants of Corporate investment banker  Strain: Not on file  Food Insecurity: Not on file  Transportation Needs: Not on file  Physical Activity: Not on file  Stress: Not on file  Social Connections: Not on file  Intimate Partner Violence: Not on file    Family History  Problem Relation Age of Onset   Breast cancer Mother    Hypertension Mother    Chronic Renal Failure Father    Stroke Sister 34       smoker   Breast cancer Sister    Diabetes Sister    Hypertension Sister    Heart attack Paternal Grandmother    Diabetes Maternal Aunt    Colon cancer Neg Hx     Current Outpatient Medications on File Prior to Visit  Medication Sig Dispense Refill    atorvastatin (LIPITOR) 10 MG tablet Take 1 tablet (10 mg total) by mouth daily. 90 tablet 1   B-D ULTRAFINE III SHORT PEN 31G X 8 MM MISC USE TO INJECT INSULIN TWICE DAILY 100 each 3   furosemide (LASIX) 20 MG tablet Take 1 tablet (20 mg total) by mouth daily. With 40 mg tablet to equal 60 mg daily. Can Do additional 20 mg daily for SOB as needed 135 tablet 1   furosemide (LASIX) 40 MG tablet Take 1 tablet (40 mg total) by mouth daily. With 20 mg tablet to equal 60 mg daily. Can Do additional 20 mg daily for SOB as needed 135 tablet 1   glipiZIDE (GLUCOTROL XL) 5 MG 24 hr tablet Take 1 tablet (5 mg total) by mouth daily with breakfast. 90 tablet 1   glucose blood (CONTOUR TEST) test strip Use as instructed to monitor glucose 3 times daily as instructed 200 each 0   insulin isophane & regular human KwikPen (NOVOLIN 70/30 KWIKPEN) (70-30) 100 UNIT/ML KwikPen Inject 75 Units into the skin 2 (two) times daily before a meal. 75 units with breakfast and 75 units with supper if glucose is above 90 and she is eating 90 mL 3   Lancets (ONETOUCH DELICA PLUS LANCET33G) MISC USE TO TEST BLOOD SUGAR FOUR TIMES DAILY     metFORMIN (GLUCOPHAGE-XR) 500 MG 24 hr tablet Take 2 tablets (1,000 mg total) by mouth daily with breakfast. 180 tablet 1   metoprolol succinate (TOPROL-XL) 100 MG 24 hr tablet Take 1 tablet (100 mg total) by mouth daily. 30 tablet 6   potassium chloride SA (KLOR-CON M) 20 MEQ tablet Take 2 tablets (40 mEq total) by mouth daily. 60 tablet 6   sacubitril-valsartan (ENTRESTO) 97-103 MG Take 1 tablet by mouth 2 (two) times daily. 60 tablet 6   sertraline (ZOLOFT) 50 MG tablet Take 1 tablet (50 mg total) by mouth daily. 90 tablet 1   spironolactone (ALDACTONE) 25 MG tablet Take 12.5 mg by mouth daily.     fluconazole (DIFLUCAN) 150 MG tablet Take 150 mg by mouth once. (Patient not taking: Reported on 09/16/2022)     No current facility-administered medications on file prior to visit.    Allergies as  of 10/28/2022 - Review Complete 10/28/2022  Allergen Reaction Noted   Aspartame and phenylalanine Dermatitis 07/30/2012     ROS:   General:  No weight loss, Fever, chills  HEENT: No recent headaches, no nasal bleeding, no visual changes, no sore throat  Neurologic: No dizziness, blackouts, seizures. No recent symptoms of stroke or mini- stroke. No recent episodes of slurred speech, or temporary blindness.  Cardiac: No recent episodes of chest pain/pressure, no shortness of breath  at rest.  No shortness of breath with exertion.  Denies history of atrial fibrillation or irregular heartbeat  Vascular: No history of rest pain in feet.  No history of claudication.  No history of non-healing ulcer, No history of DVT   Pulmonary: No home oxygen, no productive cough, no hemoptysis,  No asthma or wheezing  Musculoskeletal:  [ ]  Arthritis, [ ]  Low back pain,  [ ]  Joint pain  Hematologic:No history of hypercoagulable state.  No history of easy bleeding.  No history of anemia  Gastrointestinal: No hematochezia or melena,  No gastroesophageal reflux, no trouble swallowing  Urinary: [ ]  chronic Kidney disease, [ ]  on HD - [ ]  MWF or [ ]  TTHS, [ ]  Burning with urination, [ ]  Frequent urination, [ ]  Difficulty urinating;   Skin: No rashes  Psychological: No history of anxiety,  No history of depression  Physical Examination  Vitals:   10/28/22 1447  BP: 129/82  Pulse: 90  Resp: 16  Temp: 97.8 F (36.6 C)  TempSrc: Temporal  SpO2: 97%  Weight: 193 lb (87.5 kg)  Height: 5\' 8"  (1.727 m)    Body mass index is 29.35 kg/m.  General:  Alert and oriented, no acute distress HEENT: Normal Neck: No bruit or JVD Pulmonary: Clear to auscultation bilaterally Cardiac: Regular Rate and Rhythm without murmur Abdomen: Soft, non-tender, non-distended, no mass, no scars Skin: No rash Extremity Pulses:  2+ radial, brachial, femoral, dorsalis pedis, posterior tibial pulses  bilaterally Musculoskeletal: No deformity or edema  Neurologic: Upper and lower extremity motor 5/5 and symmetric  DATA: +------------------+--------+------+-----------+-----------+---------------  ---+  LEFT             Reflux  RefluxReflux Time Diameter  Comments                               No       Yes                 cms                         +------------------+--------+------+-----------+-----------+---------------  ---+  CFV              no                                                       +------------------+--------+------+-----------+-----------+---------------  ---+  FV mid            no                                                       +------------------+--------+------+-----------+-----------+---------------  ---+  Popliteal        no                                                       +------------------+--------+------+-----------+-----------+---------------  ---+  GSV at [facility]  yes    >500 ms     0.79                         +------------------+--------+------+-----------+-----------+---------------  ---+  GSV prox thigh    no                          0.41                         +------------------+--------+------+-----------+-----------+---------------  ---+  GSV mid thigh              yes    >500 ms     0.37                         +------------------+--------+------+-----------+-----------+---------------  ---+  GSV dist thigh             yes    >500 ms     0.37    out of fascia        +------------------+--------+------+-----------+-----------+---------------  ---+  GSV at knee                yes    >500 ms     0.40                         +------------------+--------+------+-----------+-----------+---------------  ---+  GSV prox calf              yes    >500 ms     0.44                          +------------------+--------+------+-----------+-----------+---------------  ---+  SSV Pop Fossa     no                          0.23                         +------------------+--------+------+-----------+-----------+---------------  ---+  anterior accessoryno                          0.32    proximal                                                                   varicosity           +------------------+--------+------+-----------+-----------+---------------  ---+      Summary:  Left:  - No evidence of deep vein thrombosis from the common femoral through the  popliteal veins.  - No evidence of superficial venous thrombosis.  - The deep venous system is competent.  - The great saphenous vein is not competent.  - The anterior accessory branch is competent with a varicosity off of the  proximal segment which courses to the lateral varicosities.     Assessment:  Plan:  Fabienne Bruns, MD [facility] [place] Office: [phone] Pager: [phone]

## 2022-10-29 ENCOUNTER — Telehealth: Payer: Self-pay

## 2022-10-29 NOTE — Progress Notes (Signed)
EPIC Encounter for ICM Monitoring  Patient Name: Tara Bryant is a 52 y.o. female Date: 10/29/2022 Primary Care Physican: Tommie Sams, DO Primary Cardiologist: Branch/Bensimhon Electrophysiologist: Ladona Ridgel 04/16/2022 Weight: 195 lbs 09/16/2022 Office Weight: 193 lbs                              Attempted call to patient and unable to reach.  Left detailed message per DPR regarding transmission. Transmission reviewed.    HeartLogic Heart Failure Index decreased from 29 to 24 suggesting possible fluid accumulation continues.      Prescribed: Furosemide 40 mg Take 60 mg by mouth daily. Can take an additional 20 mg for SOB Potassium 20 mEq Take 2 tablets (40 mEq total) by mouth daily. Spironolactone 25 mg take 0.5 tablet (12.5 mg total) by mouth daily.   Labs: 11/28/2021 Creatinine 0.96, BUN 19, Potassium 3.3, Sodium 140, GFR >60 A complete set of results can be found in Results Review.   Recommendations: Left voice mail with ICM number and encouraged to call if experiencing any fluid symptoms.   Follow-up plan: ICM clinic phone appointment on 11/11/2022 to recheck fluid levels.   91 day device clinic remote transmission 11/01/2022.              EP/Cardiology next office visit:  Recall 08/09/2023 with Dr Ladona Ridgel.    Recall 05/01/2020 with Dr Gala Romney.  10/16/2021 was last visit with Dr Wyline Mood and advised to reestablish with Dr Gala Romney (Recall 05/01/2020) .          Copy of ICM check sent to Dr. Ladona Ridgel.  3 Month HeartLogicT Heart Failure Index:    8 Day Data Trend:          Karie Soda, RN 10/29/2022 7:45 AM

## 2022-10-29 NOTE — Telephone Encounter (Signed)
Remote ICM transmission received.  Attempted call to patient regarding ICM remote transmission and left detailed message per DPR.  Left ICM phone number and advised to return call for any fluid symptoms or questions. Next ICM remote transmission scheduled 11/11/2022.    

## 2022-10-30 ENCOUNTER — Other Ambulatory Visit: Payer: Self-pay

## 2022-10-30 DIAGNOSIS — I83813 Varicose veins of bilateral lower extremities with pain: Secondary | ICD-10-CM

## 2022-11-01 ENCOUNTER — Ambulatory Visit (INDEPENDENT_AMBULATORY_CARE_PROVIDER_SITE_OTHER): Payer: 59

## 2022-11-01 DIAGNOSIS — I428 Other cardiomyopathies: Secondary | ICD-10-CM

## 2022-11-05 ENCOUNTER — Other Ambulatory Visit: Payer: Self-pay

## 2022-11-05 ENCOUNTER — Telehealth: Payer: Self-pay | Admitting: Nurse Practitioner

## 2022-11-05 DIAGNOSIS — E1165 Type 2 diabetes mellitus with hyperglycemia: Secondary | ICD-10-CM

## 2022-11-05 MED ORDER — ACCU-CHEK SMARTVIEW VI STRP: 1.0000 | ORAL_STRIP | Freq: Three times a day (TID) | 1 refills | Status: DC

## 2022-11-05 MED ORDER — BLOOD GLUCOSE MONITOR KIT
PACK | 0 refills | Status: DC
Start: 2022-11-05 — End: 2022-11-06

## 2022-11-05 MED ORDER — LANCETS MISC: 1 refills | Status: DC

## 2022-11-05 MED ORDER — BLOOD GLUCOSE MONITOR KIT: PACK | 0 refills | Status: DC

## 2022-11-05 NOTE — Telephone Encounter (Signed)
Rx sent 

## 2022-11-05 NOTE — Telephone Encounter (Signed)
Pt has called to ask for a prescription to be called into [facility] [address] [place],  for Accucheck Smartview Meter and test strips.

## 2022-11-06 ENCOUNTER — Other Ambulatory Visit: Payer: Self-pay

## 2022-11-06 MED ORDER — LANCETS MISC: 2 refills | Status: AC

## 2022-11-06 MED ORDER — ACCU-CHEK GUIDE ME W/DEVICE KIT: PACK | 0 refills | Status: AC

## 2022-11-06 MED ORDER — ACCU-CHEK GUIDE VI STRP: ORAL_STRIP | 2 refills | Status: DC

## 2022-11-08 NOTE — Progress Notes (Signed)
Remote ICD transmission.   

## 2022-11-11 ENCOUNTER — Telehealth: Payer: Self-pay

## 2022-11-11 ENCOUNTER — Ambulatory Visit: Payer: 59 | Attending: Internal Medicine

## 2022-11-11 ENCOUNTER — Telehealth: Payer: Self-pay | Admitting: "Endocrinology

## 2022-11-11 DIAGNOSIS — Z95810 Presence of automatic (implantable) cardiac defibrillator: Secondary | ICD-10-CM

## 2022-11-11 DIAGNOSIS — I5022 Chronic systolic (congestive) heart failure: Secondary | ICD-10-CM

## 2022-11-11 DIAGNOSIS — E1165 Type 2 diabetes mellitus with hyperglycemia: Secondary | ICD-10-CM

## 2022-11-11 MED ORDER — ACCU-CHEK GUIDE VI STRP
ORAL_STRIP | 2 refills | Status: DC
Start: 2022-11-11 — End: 2023-11-27

## 2022-11-11 NOTE — Telephone Encounter (Signed)
 Remote ICM transmission received.  Attempted call to patient regarding ICM remote transmission and left detailed message per DPR.  Left ICM phone number and advised to return call for any fluid symptoms or questions. Next ICM remote transmission scheduled 11/25/2022.

## 2022-11-11 NOTE — Telephone Encounter (Signed)
Pt said she needs a 30 day supply of test strips sent in. [facility] is rejecting the 100 day supply

## 2022-11-11 NOTE — Progress Notes (Signed)
EPIC Encounter for ICM Monitoring  Patient Name: Tara Bryant is a 52 y.o. female Date: 11/11/2022 Primary Care Physican: Tommie Sams, DO Primary Cardiologist: Branch/Bensimhon Electrophysiologist: Ladona Ridgel 04/16/2022 Weight: 195 lbs 09/16/2022 Office Weight: 193 lbs                              Attempted call to patient and unable to reach.  Left detailed message per DPR regarding transmission. Transmission reviewed.    HeartLogic Heart Failure Index decreased to 6 suggesting fluid levels returned to normal.      Prescribed: Furosemide 40 mg Take 60 mg by mouth daily. Can take an additional 20 mg for SOB Potassium 20 mEq Take 2 tablets (40 mEq total) by mouth daily. Spironolactone 25 mg take 0.5 tablet (12.5 mg total) by mouth daily.   Labs: 11/28/2021 Creatinine 0.96, BUN 19, Potassium 3.3, Sodium 140, GFR >60 A complete set of results can be found in Results Review.   Recommendations: Left voice mail with ICM number and encouraged to call if experiencing any fluid symptoms.   Follow-up plan: ICM clinic phone appointment on 11/25/2022.   91 day device clinic remote transmission 01/31/2023.              EP/Cardiology next office visit:  Recall 08/09/2023 with Dr Ladona Ridgel.    Recall 05/01/2020 with Dr Gala Romney.  10/16/2021 was last visit with Dr Wyline Mood and advised to reestablish with Dr Gala Romney (Recall 05/01/2020) .          Copy of ICM check sent to Dr. Ladona Ridgel.  3 Month HeartLogicT Heart Failure Index:    8 Day Data Trend:          Karie Soda, RN 11/11/2022 2:52 PM

## 2022-11-11 NOTE — Telephone Encounter (Signed)
Rx refill sent  

## 2022-11-25 ENCOUNTER — Ambulatory Visit: Payer: 59 | Attending: Internal Medicine

## 2022-11-25 DIAGNOSIS — I5022 Chronic systolic (congestive) heart failure: Secondary | ICD-10-CM

## 2022-11-25 DIAGNOSIS — Z95810 Presence of automatic (implantable) cardiac defibrillator: Secondary | ICD-10-CM | POA: Diagnosis not present

## 2022-11-28 ENCOUNTER — Telehealth: Payer: Self-pay

## 2022-11-28 NOTE — Progress Notes (Signed)
EPIC Encounter for ICM Monitoring  Patient Name: Tara Bryant is a 52 y.o. female Date: 11/28/2022 Primary Care Physican: Tommie Sams, DO Primary Cardiologist: Branch/Bensimhon Electrophysiologist: Ladona Ridgel 04/16/2022 Weight: 195 lbs 09/16/2022 Office Weight: 193 lbs                              Attempted call to patient and unable to reach.  Left detailed message per DPR regarding transmission. Transmission reviewed.    HeartLogic Heart Failure Index 0 suggesting normal fluid levels.      Prescribed: Furosemide 40 mg Take 60 mg by mouth daily. Can take an additional 20 mg for SOB Potassium 20 mEq Take 2 tablets (40 mEq total) by mouth daily. Spironolactone 25 mg take 0.5 tablet (12.5 mg total) by mouth daily.   Labs: 11/28/2021 Creatinine 0.96, BUN 19, Potassium 3.3, Sodium 140, GFR >60 A complete set of results can be found in Results Review.   Recommendations:  Left voice mail with ICM number and encouraged to call if experiencing any fluid symptoms.   Follow-up plan: ICM clinic phone appointment on 12/30/2022.   91 day device clinic remote transmission 01/31/2023.              EP/Cardiology next office visit:  Recall 08/09/2023 with Dr Ladona Ridgel.    Recall 05/01/2020 with Dr Gala Romney.  10/16/2021 was last visit with Dr Wyline Mood and advised to reestablish with Dr Gala Romney (Recall 05/01/2020).          Copy of ICM check sent to Dr. Ladona Ridgel.  3 month ICM trend: 11/25/2022.    12-14 Month ICM trend:     Karie Soda, RN 11/28/2022 10:43 AM

## 2022-11-28 NOTE — Telephone Encounter (Signed)
Remote ICM transmission received.  Attempted call to patient regarding ICM remote transmission and left detailed message per DPR.  Left ICM phone number and advised to return call for any fluid symptoms or questions. Next ICM remote transmission scheduled 12/30/2022.

## 2022-12-16 ENCOUNTER — Telehealth: Payer: Self-pay

## 2022-12-16 NOTE — Telephone Encounter (Signed)
Spoke with patient and she reports she is receiving a bills for the remote transmissions and no sure what all the charges are.  Discussed monthly ICM charges and advised to call the billing department to discuss the bills she received and coverage.  Explained the remote monitoring replaces office visits to check devices.  Explained she will need a office device check either every 3 or 6 months depending on which device she has.  She understood and appreciative of the help.

## 2022-12-16 NOTE — Telephone Encounter (Signed)
Returned patient call as requested by voice mail message and left message was returning her call.

## 2022-12-16 NOTE — Telephone Encounter (Signed)
Spoke with patient and advised her out of pocket is $21.25 per month.  She stated she will gather all the remote monitoring copay info and decide if she should continue monthly and 3 month remote monitoring.  Explained she can choose either 3 month office visits for device check or 3 month remote monitoring.  Advised next ICM monthly transmission is due 9/23 and to call if wants to cancel.

## 2022-12-23 ENCOUNTER — Ambulatory Visit: Payer: 59 | Admitting: Nurse Practitioner

## 2022-12-23 DIAGNOSIS — Z794 Long term (current) use of insulin: Secondary | ICD-10-CM

## 2022-12-23 DIAGNOSIS — Z7984 Long term (current) use of oral hypoglycemic drugs: Secondary | ICD-10-CM

## 2022-12-23 DIAGNOSIS — I10 Essential (primary) hypertension: Secondary | ICD-10-CM

## 2022-12-23 DIAGNOSIS — E559 Vitamin D deficiency, unspecified: Secondary | ICD-10-CM

## 2022-12-23 DIAGNOSIS — E782 Mixed hyperlipidemia: Secondary | ICD-10-CM

## 2022-12-23 DIAGNOSIS — Z91148 Patient's other noncompliance with medication regimen for other reason: Secondary | ICD-10-CM

## 2022-12-23 DIAGNOSIS — E1165 Type 2 diabetes mellitus with hyperglycemia: Secondary | ICD-10-CM

## 2022-12-30 ENCOUNTER — Ambulatory Visit: Payer: 59 | Attending: Internal Medicine

## 2022-12-30 DIAGNOSIS — Z95810 Presence of automatic (implantable) cardiac defibrillator: Secondary | ICD-10-CM | POA: Diagnosis not present

## 2022-12-30 DIAGNOSIS — I5022 Chronic systolic (congestive) heart failure: Secondary | ICD-10-CM | POA: Diagnosis not present

## 2023-01-07 NOTE — Progress Notes (Addendum)
EPIC Encounter for ICM Monitoring  Patient Name: Tara Bryant is a 52 y.o. female Date: 01/07/2023 Primary Care Physican: Tommie Sams, DO Primary Cardiologist: Branch/Bensimhon Electrophysiologist: Ladona Ridgel 04/16/2022 Weight: 195 lbs 09/16/2022 Office Weight: 193 lbs                              Transmission reviewed.    HeartLogic Heart Failure Index 0 suggesting normal fluid levels.      Prescribed: Furosemide 40 mg Take 60 mg by mouth daily. Can take an additional 20 mg for SOB Potassium 20 mEq Take 2 tablets (40 mEq total) by mouth daily. Spironolactone 25 mg take 0.5 tablet (12.5 mg total) by mouth daily.   Labs: 11/28/2021 Creatinine 0.96, BUN 19, Potassium 3.3, Sodium 140, GFR >60 A complete set of results can be found in Results Review.   Recommendations:  No changes.   Follow-up plan: ICM clinic phone appointment on 02/03/2023.   91 day device clinic remote transmission 01/31/2023.              EP/Cardiology next office visit:  Recall 08/09/2023 with Dr Ladona Ridgel.    Recall 05/01/2020 with Dr Gala Romney.  10/16/2021 was last visit with Dr Wyline Mood and aware to to reestablish with Dr Gala Romney (Recall 05/01/2020).          Copy of ICM check sent to Dr. Ladona Ridgel.  3 month ICM trend: 01/06/2023.    8 day Data Trend:     Karie Soda, RN 01/07/2023 10:39 AM

## 2023-01-10 ENCOUNTER — Other Ambulatory Visit: Payer: Self-pay | Admitting: Family Medicine

## 2023-01-10 ENCOUNTER — Other Ambulatory Visit: Payer: Self-pay | Admitting: Nurse Practitioner

## 2023-01-10 DIAGNOSIS — Z1211 Encounter for screening for malignant neoplasm of colon: Secondary | ICD-10-CM

## 2023-01-10 DIAGNOSIS — E1165 Type 2 diabetes mellitus with hyperglycemia: Secondary | ICD-10-CM

## 2023-01-10 DIAGNOSIS — Z1212 Encounter for screening for malignant neoplasm of rectum: Secondary | ICD-10-CM

## 2023-01-27 ENCOUNTER — Ambulatory Visit (INDEPENDENT_AMBULATORY_CARE_PROVIDER_SITE_OTHER): Payer: 59 | Admitting: Family Medicine

## 2023-01-27 VITALS — BP 137/78 | HR 89 | Temp 97.3°F | Ht 68.0 in | Wt 186.0 lb

## 2023-01-27 DIAGNOSIS — Z91199 Patient's noncompliance with other medical treatment and regimen due to unspecified reason: Secondary | ICD-10-CM | POA: Diagnosis not present

## 2023-01-27 DIAGNOSIS — Z23 Encounter for immunization: Secondary | ICD-10-CM

## 2023-01-27 DIAGNOSIS — I5022 Chronic systolic (congestive) heart failure: Secondary | ICD-10-CM | POA: Diagnosis not present

## 2023-01-27 DIAGNOSIS — Z1211 Encounter for screening for malignant neoplasm of colon: Secondary | ICD-10-CM | POA: Diagnosis not present

## 2023-01-27 DIAGNOSIS — I10 Essential (primary) hypertension: Secondary | ICD-10-CM

## 2023-01-27 NOTE — Patient Instructions (Addendum)
Please take your medication as directed.  Please schedule an appt with cardiology.  Follow up in 3-6 months.

## 2023-01-27 NOTE — Assessment & Plan Note (Signed)
BP fairly well-controlled here today.

## 2023-01-27 NOTE — Assessment & Plan Note (Signed)
This is the main issue for this patient.  Noncompliance is very limiting in regards to her care.  Advised that she needs to be compliant.  Needs regular follow-up

## 2023-01-27 NOTE — Progress Notes (Signed)
Subjective:  [name], female    DOB: 06-04-1970  Age: 52 y.o. MRN: [id number]  CC:  Follow up   HPI:  52 year old female with the below mentioned medical problems presents for follow up.  HTN/CAD/CHF BP fairly well controlled.  Compliance is a big issue. Currently on Lasix 60 mg daily, Metoprolol, Entresto, Spironolactone.  EMR reflects noncompliance. Needs updated echocardiogram.  Needs to see cardiology.  Type 2 diabetes Remains uncontrolled.  Noncompliance definitely playing a role.  She is prescribed Novolin 70/30, metformin, and glipizide.  Has follow-up with endocrinology in November.   Patient Active Problem List   Diagnosis Date Noted   Varicose veins of both lower extremities with pain 09/09/2022   Lower extremity pain 09/09/2022   Noncompliance 03/21/2022   CAD (coronary artery disease) 09/19/2021   Anxiety and depression 09/19/2021   OSA (obstructive sleep apnea) 09/30/2019   Chronic systolic heart failure (HCC) 02/04/2019   ICD (implantable cardioverter-defibrillator) in place 02/04/2019   Uncontrolled type 2 diabetes mellitus with hyperglycemia (HCC) 06/10/2018   Mixed hyperlipidemia 06/10/2018   Essential hypertension, benign 07/30/2012    Social Hx   Social History   Socioeconomic History   Marital status: Divorced    Spouse name: Not on file   Number of children: 1   Years of education: Not on file   Highest education level: Not on file  Occupational History   Occupation: Home maker  Tobacco Use   Smoking status: Never   Smokeless tobacco: Never  Vaping Use   Vaping status: Never Used  Substance and Sexual Activity   Alcohol use: No   Drug use: No   Sexual activity: Yes    Birth control/protection: Pill  Other Topics Concern   Not on file  Social History Narrative   Not on file   Social Determinants of Health   Financial Resource Strain: Not on file  Food Insecurity: Not on file  Transportation Needs: Not on file   Physical Activity: Not on file  Stress: Not on file  Social Connections: Not on file    Review of Systems  Respiratory: Negative.    Cardiovascular: Negative.     Objective:  BP 137/78   Pulse 89   Temp (!) 97.3 F (36.3 C)   Ht 5\' 8"  (1.727 m)   Wt 186 lb (84.4 kg)   SpO2 99%   BMI 28.28 kg/m      01/27/2023   11:27 AM 01/27/2023   10:38 AM 10/28/2022    2:47 PM  BP/Weight  Systolic BP 137 153 129  Diastolic BP 78 92 82  Wt. (Lbs)  186 193  BMI  28.28 kg/m2 29.35 kg/m2    Physical Exam Constitutional:      General: She is not in acute distress.    Appearance: Normal appearance.  HENT:     Head: Normocephalic and atraumatic.  Eyes:     General:        Right eye: No discharge.        Left eye: No discharge.     Conjunctiva/sclera: Conjunctivae normal.  Cardiovascular:     Rate and Rhythm: Normal rate.     Comments: Regularly irregular secondary to ectopy. Pulmonary:     Effort: Pulmonary effort is normal.     Breath sounds: Normal breath sounds. No wheezing or rales.  Neurological:     Mental Status: She is alert.  Psychiatric:     Comments: Flat affect.  Lab Results  Component Value Date   WBC 7.1 09/16/2022   HGB 13.3 09/16/2022   HCT 38.1 09/16/2022   PLT 221 09/16/2022   GLUCOSE 350 (H) 09/16/2022   CHOL 194 09/16/2022   TRIG 111 09/16/2022   HDL 39 (L) 09/16/2022   LDLCALC 133 (H) 09/16/2022   ALT 30 09/16/2022   AST 21 09/16/2022   NA 135 09/16/2022   K 3.9 09/16/2022   CL 101 09/16/2022   CREATININE 0.78 09/16/2022   BUN 14 09/16/2022   CO2 24 09/16/2022   TSH 1.652 10/02/2021   INR 1.1 12/04/2019   HGBA1C 9.4 (H) 09/16/2022   MICROALBUR 13.0 (H) 09/16/2022     Assessment & Plan:   Problem List Items Addressed This Visit       Cardiovascular and Mediastinum   Essential hypertension, benign - Primary (Chronic)    BP fairly well-controlled here today.      Chronic systolic heart failure (HCC)    Needs to be  compliant.  Needs follow-up with cardiology.  Needs echo.        Other   Noncompliance    This is the main issue for this patient.  Noncompliance is very limiting in regards to her care.  Advised that she needs to be compliant.  Needs regular follow-up      Other Visit Diagnoses     Immunization due       Relevant Orders   Flu vaccine trivalent PF, 6mos and older(Flulaval,Afluria,Fluarix,Fluzone) (Completed)   Encounter for screening colonoscopy       Relevant Orders   Ambulatory referral to Gastroenterology      Follow-up:  Return for Follow up Chronic medical issues.  Everlene Other DO [facility]

## 2023-01-27 NOTE — Assessment & Plan Note (Signed)
Needs to be compliant.  Needs follow-up with cardiology.  Needs echo.

## 2023-01-31 ENCOUNTER — Ambulatory Visit: Payer: 59 | Attending: Internal Medicine

## 2023-01-31 DIAGNOSIS — I428 Other cardiomyopathies: Secondary | ICD-10-CM | POA: Diagnosis not present

## 2023-01-31 LAB — CUP PACEART REMOTE DEVICE CHECK
Battery Remaining Longevity: 132 mo
Battery Remaining Percentage: 94 %
Brady Statistic RV Percent Paced: 0 %
Date Time Interrogation Session: 20241025044100
HighPow Impedance: 81 Ohm
Implantable Lead Connection Status: 753985
Implantable Lead Implant Date: 20200723
Implantable Lead Location: 753860
Implantable Lead Model: 293
Implantable Lead Serial Number: 443823
Implantable Pulse Generator Implant Date: 20200723
Lead Channel Impedance Value: 448 Ohm
Lead Channel Setting Pacing Amplitude: 2.5 V
Lead Channel Setting Pacing Pulse Width: 0.4 ms
Lead Channel Setting Sensing Sensitivity: 0.5 mV
Pulse Gen Serial Number: 256025
Zone Setting Status: 755011

## 2023-02-03 ENCOUNTER — Ambulatory Visit (HOSPITAL_COMMUNITY)
Admission: RE | Admit: 2023-02-03 | Discharge: 2023-02-03 | Disposition: A | Discharge status: Home / Self Care | Payer: 59 | Source: Ambulatory Visit | Attending: Surgery | Admitting: Surgery

## 2023-02-03 ENCOUNTER — Ambulatory Visit (INDEPENDENT_AMBULATORY_CARE_PROVIDER_SITE_OTHER): Payer: 59 | Admitting: Surgery

## 2023-02-03 ENCOUNTER — Ambulatory Visit (INDEPENDENT_AMBULATORY_CARE_PROVIDER_SITE_OTHER): Payer: 59

## 2023-02-03 ENCOUNTER — Encounter: Payer: Self-pay | Admitting: Surgery

## 2023-02-03 VITALS — BP 126/84 | HR 85 | Temp 97.9°F | Resp 20 | Ht 68.0 in | Wt 192.0 lb

## 2023-02-03 DIAGNOSIS — I5022 Chronic systolic (congestive) heart failure: Secondary | ICD-10-CM

## 2023-02-03 DIAGNOSIS — I83813 Varicose veins of bilateral lower extremities with pain: Secondary | ICD-10-CM | POA: Diagnosis not present

## 2023-02-03 DIAGNOSIS — Z95810 Presence of automatic (implantable) cardiac defibrillator: Secondary | ICD-10-CM

## 2023-02-03 NOTE — Progress Notes (Signed)
[facility]  Patient name: Tara Bryant MRN: [id number] DOB: [birth year] Sex: female     REASON FOR CONSULT:    Varicose veins follow up  HISTORY OF PRESENT ILLNESS:   Tara Bryant is a 52 y.o. female, who returns today for follow-up of her leg swelling.  She was seen on 10/28/2022, however the note has been deleted.  At that time she was placed and 20-30 bilateral thigh-high compression stockings.  The patient notes swelling for many years that has progressively gotten worse.  Prolonged standing exacerbates her symptoms.  She denies any history of DVT.  She does not have any history of lymphedema or skin changes.  Her edema initially began following pregnancy which resolved.  She has subsequently developed swelling again with painful varicosities.  She does have a history of congestive heart failure that is being managed with diuretics.  She has a AICD in place.  She has a history of coronary artery disease, status post catheterization in 2019 with 60% mid LAD stenosis that was treated medically.  She is a diabetic.  She is medically managed for hypertension.  She is not a smoker.  She is on a statin for hypercholesterolemia.  PAST MEDICAL HISTORY    Past Medical History:  Diagnosis Date   AICD (automatic cardioverter/defibrillator) present    Arrhythmia    Arthritis    CAD (coronary artery disease)    a. 03/2018: cath showing 60% mid-LAD stenosis not significant by FFR.    Chronic combined systolic (congestive) and diastolic (congestive) heart failure (HCC)    a. 03/2018: found to have a newly reduced EF of 20% by echocardiogram   Depression    denies   Diabetes mellitus    GERD (gastroesophageal reflux disease)    h/o of   Hypertension    Microproteinuria    S/P thyroid biopsy 2020   follow up once a year,    Sleep apnea      FAMILY HISTORY   Family History  Problem Relation Age of Onset   Breast  cancer Mother    Hypertension Mother    Chronic Renal Failure Father    Stroke Sister 39       smoker   Breast cancer Sister    Diabetes Sister    Hypertension Sister    Heart attack Paternal Grandmother    Diabetes Maternal Aunt    Colon cancer Neg Hx     SOCIAL HISTORY:   Social History   Socioeconomic History   Marital status: Divorced    Spouse name: Not on file   Number of children: 1   Years of education: Not on file   Highest education level: Not on file  Occupational History   Occupation: Home maker  Tobacco Use   Smoking status: Never   Smokeless tobacco: Never  Vaping Use   Vaping status: Never Used  Substance and Sexual Activity   Alcohol use: No   Drug use: No   Sexual activity: Yes    Birth control/protection: Pill  Other Topics Concern   Not on file  Social History Narrative   Not on file   Social Determinants of Health   Financial Resource Strain: Not on file  Food Insecurity: Not on file  Transportation Needs: Not on file  Physical Activity: Not on file  Stress: Not on file  Social Connections: Not on file  Intimate Partner Violence: Not on file    ALLERGIES:  Allergies  Allergen Reactions   Aspartame And Phenylalanine Dermatitis    CURRENT MEDICATIONS:    Current Outpatient Medications  Medication Sig Dispense Refill   atorvastatin (LIPITOR) 10 MG tablet Take 1 tablet (10 mg total) by mouth daily. 90 tablet 1   Blood Glucose Monitoring Suppl (ACCU-CHEK GUIDE ME) w/Device KIT Use to test BG tid. Dx E11.65 1 kit 0   furosemide (LASIX) 20 MG tablet Take 1 tablet (20 mg total) by mouth daily. With 40 mg tablet to equal 60 mg daily. Can Do additional 20 mg daily for SOB as needed 135 tablet 1   furosemide (LASIX) 40 MG tablet Take 1 tablet (40 mg total) by mouth daily. With 20 mg tablet to equal 60 mg daily. Can Do additional 20 mg daily for SOB as needed 135 tablet 1   glipiZIDE (GLUCOTROL XL) 5 MG 24 hr tablet Take 1 tablet (5 mg  total) by mouth daily with breakfast. 90 tablet 1   glucose blood (ACCU-CHEK GUIDE) test strip Use to test BG tid. E11.65 100 each 2   insulin isophane & regular human KwikPen (NOVOLIN 70/30 KWIKPEN) (70-30) 100 UNIT/ML KwikPen INJECT 75 UNITS WITH BREAKFAST AND 75 UNITS WITH SUPPER IF GLUCOSE IS ABOVE 90 AND SHE IS EATING 15 mL 5   Lancets MISC For Accu-chek smartview E11.65 Use to test tid. 300 each 2   metFORMIN (GLUCOPHAGE-XR) 500 MG 24 hr tablet Take 2 tablets (1,000 mg total) by mouth daily with breakfast. 180 tablet 1   metoprolol succinate (TOPROL-XL) 100 MG 24 hr tablet Take 1 tablet (100 mg total) by mouth daily. 30 tablet 6   potassium chloride SA (KLOR-CON M) 20 MEQ tablet Take 2 tablets (40 mEq total) by mouth daily. 60 tablet 6   sacubitril-valsartan (ENTRESTO) 97-103 MG Take 1 tablet by mouth 2 (two) times daily. 60 tablet 6   sertraline (ZOLOFT) 50 MG tablet Take 1 tablet (50 mg total) by mouth daily. 90 tablet 1   spironolactone (ALDACTONE) 25 MG tablet Take 12.5 mg by mouth daily.     No current facility-administered medications for this visit.    REVIEW OF SYSTEMS:   [X]  denotes positive finding, [ ]  denotes negative finding Cardiac  Comments:  Chest pain or chest pressure:    Shortness of breath upon exertion:    Short of breath when lying flat:    Irregular heart rhythm:        Vascular    Pain in calf, thigh, or hip brought on by ambulation:    Pain in feet at night that wakes you up from your sleep:     Blood clot in your veins:    Leg swelling:  x       Pulmonary    Oxygen at home:    Productive cough:     Wheezing:         Neurologic    Sudden weakness in arms or legs:     Sudden numbness in arms or legs:     Sudden onset of difficulty speaking or slurred speech:    Temporary loss of vision in one eye:     Problems with dizziness:         Gastrointestinal    Blood in stool:      Vomited blood:         Genitourinary    Burning when urinating:      Blood in urine:        Psychiatric  Major depression:         Hematologic    Bleeding problems:    Problems with blood clotting too easily:        Skin    Rashes or ulcers:        Constitutional    Fever or chills:     PHYSICAL EXAM:   There were no vitals filed for this visit.  GENERAL: The patient is a well-nourished female, in no acute distress. The vital signs are documented above. CARDIAC: There is a regular rate and rhythm.  VASCULAR: SonoSite was used to evaluate the saphenous vein of the right leg.  It does exit the fascia in the mid thigh.  Maximum diameter of the vein is about 4 mm in the thigh it does get a little larger up at the saphenofemoral junction.  She has several varicosities throughout the right leg.  There is 1+ pitting edema without hyperpigmentation PULMONARY: Nonlabored respirations MUSCULOSKELETAL: There are no major deformities or cyanosis. NEUROLOGIC: No focal weakness or paresthesias are detected. SKIN: There are no ulcers or rashes noted. PSYCHIATRIC: The patient has a normal affect.  STUDIES:   I have reviewed the following venous reflux studies: LEFT              Reflux  RefluxReflux Time Diameter  Comments                               No       Yes                 cms                         +------------------+--------+------+-----------+-----------+---------------  ---+  CFV              no                                                       +------------------+--------+------+-----------+-----------+---------------  ---+  FV mid            no                                                       +------------------+--------+------+-----------+-----------+---------------  ---+  Popliteal        no                                                       +------------------+--------+------+-----------+-----------+---------------  ---+  GSV at [facility]                 yes    >500 ms     0.79                          +------------------+--------+------+-----------+-----------+---------------  ---+  GSV prox thigh    no  0.41                         +------------------+--------+------+-----------+-----------+---------------  ---+  GSV mid thigh              yes    >500 ms     0.37                         +------------------+--------+------+-----------+-----------+---------------  ---+  GSV dist thigh             yes    >500 ms     0.37    out of fascia        +------------------+--------+------+-----------+-----------+---------------  ---+  GSV at knee                yes    >500 ms     0.40                         +------------------+--------+------+-----------+-----------+---------------  ---+  GSV prox calf              yes    >500 ms     0.44                         +------------------+--------+------+-----------+-----------+---------------  ---+  SSV Pop Fossa     no                          0.23                         +------------------+--------+------+-----------+-----------+---------------  ---+  anterior accessoryno                          0.32    proximal                                                                   varicosity           +------------------+--------+------+-----------+-----------+---------------    enous Reflux Times  +--------------+---------+------+----------+------------+------------------  ----+  RIGHT        Reflux NoReflux  Reflux  Diameter cmsComments                                         Yes     Time                                        +--------------+---------+------+----------+------------+------------------  ----+  CFV          no                                                            +--------------+---------+------+----------+------------+------------------  ----+  FV prox                 yes  >1 second                                       +--------------+---------+------+----------+------------+------------------  ----+  FV mid        no                                   duplicatred vein         +--------------+---------+------+----------+------------+------------------  ----+  FV dist                 yes  >1 second             duplicated vein          +--------------+---------+------+----------+------------+------------------  ----+  Popliteal    no                                                            +--------------+---------+------+----------+------------+------------------  ----+  GSV at [facility]              yes   >500 ms      0.85                             +--------------+---------+------+----------+------------+------------------  ----+  GSV prox thigh          yes   >500 ms      0.49                             +--------------+---------+------+----------+------------+------------------  ----+  GSV mid thigh           yes   >500 ms      0.40    branching                +--------------+---------+------+----------+------------+------------------  ----+  GSV dist thighno                           0.33                             +--------------+---------+------+----------+------------+------------------  ----+  GSV at knee   no                           0.35                             +--------------+---------+------+----------+------------+------------------  ----+  GSV prox calf no                           0.42    branching                +--------------+---------+------+----------+------------+------------------  ----+  SSV Pop Fossa no  0.28    Distal thigh  extension  +--------------+---------+------+----------+------------+------------------  ----+  SSV prox calf           yes   >500 ms      0.28                              +--------------+---------+------+----------+------------+------------------  ----+       ASSESSMENT and PLAN   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  When I looked at her vein with ultrasound, I concur with the findings of a vein about 4 mm.  I do not think that her vein is large enough for ablation at this time.  She does have several prominent varicosities on the lower leg.  I discussed with her and her husband that I would recommend wearing 20-30 compression socks to alleviate her symptoms.  I will have her follow-up in 1 year to see if there has been any change in the size of her diameter.  If it is greater than 5 cm, I would consider laser ablation.   Charlena Cross, MD, FACS [facility] [phone] Pager [phone]

## 2023-02-10 ENCOUNTER — Encounter: Payer: Self-pay | Admitting: Nurse Practitioner

## 2023-02-10 ENCOUNTER — Ambulatory Visit: Payer: 59 | Admitting: Nurse Practitioner

## 2023-02-10 VITALS — BP 127/82 | HR 87 | Ht 68.0 in | Wt 186.0 lb

## 2023-02-10 DIAGNOSIS — Z91148 Patient's other noncompliance with medication regimen for other reason: Secondary | ICD-10-CM

## 2023-02-10 DIAGNOSIS — E782 Mixed hyperlipidemia: Secondary | ICD-10-CM | POA: Diagnosis not present

## 2023-02-10 DIAGNOSIS — E559 Vitamin D deficiency, unspecified: Secondary | ICD-10-CM

## 2023-02-10 DIAGNOSIS — E1165 Type 2 diabetes mellitus with hyperglycemia: Secondary | ICD-10-CM | POA: Diagnosis not present

## 2023-02-10 DIAGNOSIS — Z7984 Long term (current) use of oral hypoglycemic drugs: Secondary | ICD-10-CM | POA: Diagnosis not present

## 2023-02-10 DIAGNOSIS — Z794 Long term (current) use of insulin: Secondary | ICD-10-CM

## 2023-02-10 DIAGNOSIS — I10 Essential (primary) hypertension: Secondary | ICD-10-CM

## 2023-02-10 LAB — POCT GLYCOSYLATED HEMOGLOBIN (HGB A1C): Hemoglobin A1C: 12 % — AB (ref 4.0–5.6)

## 2023-02-10 MED ORDER — NOVOLIN 70/30 FLEXPEN (70-30) 100 UNIT/ML ~~LOC~~ SUPN
75.0000 [IU] | PEN_INJECTOR | Freq: Two times a day (BID) | SUBCUTANEOUS | 3 refills | Status: DC
Start: 2023-02-10 — End: 2023-09-29

## 2023-02-10 MED ORDER — METFORMIN HCL ER 500 MG PO TB24: 1000.0000 mg | ORAL_TABLET | Freq: Every day | ORAL | 1 refills | Status: DC

## 2023-02-10 MED ORDER — GLIPIZIDE ER 5 MG PO TB24: 5.0000 mg | ORAL_TABLET | Freq: Every day | ORAL | 1 refills | Status: DC

## 2023-02-10 NOTE — Progress Notes (Signed)
02/10/2023, 12:06 PM  Endocrinology follow-up note                              Subjective:    [name], female    DOB: [birth year].  Tara Bryant is being seen in follow-up in  management of currently uncontrolled symptomatic diabetes requested by  Tommie Sams, DO.   Past Medical History:  Diagnosis Date   AICD (automatic cardioverter/defibrillator) present    Arrhythmia    Arthritis    CAD (coronary artery disease)    a. 03/2018: cath showing 60% mid-LAD stenosis not significant by FFR.    Chronic combined systolic (congestive) and diastolic (congestive) heart failure (HCC)    a. 03/2018: found to have a newly reduced EF of 20% by echocardiogram   Depression    denies   Diabetes mellitus    GERD (gastroesophageal reflux disease)    h/o of   Hypertension    Microproteinuria    S/P thyroid biopsy 2020   follow up once a year,    Sleep apnea    Past Surgical History:  Procedure Laterality Date   ANTERIOR CERVICAL DECOMP/DISCECTOMY FUSION N/A 12/03/2019   Procedure: ANTERIOR CERVICAL DECOMPRESSION/DISCECTOMY FUSION CERVICAL SIX- CERVICAL SEVEN;  Surgeon: Lisbeth Renshaw, MD;  Location: [facility] OR;  Service: Neurosurgery;  Laterality: N/A;  ANTERIOR CERVICAL DECOMPRESSION/DISCECTOMY FUSION CERVICAL SIX- CERVICAL SEVEN   BACK SURGERY  2010   BREAST BIOPSY Left 2017   benign   CARDIAC CATHETERIZATION     CORONARY PRESSURE/FFR STUDY N/A 03/24/2018   Procedure: INTRAVASCULAR PRESSURE WIRE/FFR STUDY;  Surgeon: Marykay Lex, MD;  Location: [facility] INVASIVE CV LAB;  Service: Cardiovascular;  Laterality: N/A;   ICD IMPLANT N/A 10/29/2018   Procedure: ICD IMPLANT;  Surgeon: Marinus Maw, MD;  Location: [facility] INVASIVE CV LAB;  Service: Cardiovascular;  Laterality: N/A;   NOSE SURGERY     RIGHT/LEFT HEART CATH AND CORONARY ANGIOGRAPHY N/A 03/24/2018   Procedure: RIGHT/LEFT HEART CATH AND  CORONARY ANGIOGRAPHY;  Surgeon: Marykay Lex, MD;  Location: [facility] INVASIVE CV LAB;  Service: Cardiovascular;  Laterality: N/A;   TONSILLECTOMY     TOOTH EXTRACTION     September 2022   WISDOM TOOTH EXTRACTION     Social History   Socioeconomic History   Marital status: Divorced    Spouse name: Not on file   Number of children: 1   Years of education: Not on file   Highest education level: Not on file  Occupational History   Occupation: Home maker  Tobacco Use   Smoking status: Never   Smokeless tobacco: Never  Vaping Use   Vaping status: Never Used  Substance and Sexual Activity   Alcohol use: No   Drug use: No   Sexual activity: Yes    Birth control/protection: Pill  Other Topics Concern   Not on file  Social History Narrative   Not on file   Social Determinants of Health   Financial Resource Strain: Not on file  Food Insecurity: Not on file  Transportation Needs: Not on file  Physical  Activity: Not on file  Stress: Not on file  Social Connections: Not on file   Outpatient Encounter Medications as of 02/10/2023  Medication Sig   atorvastatin (LIPITOR) 10 MG tablet Take 1 tablet (10 mg total) by mouth daily.   Blood Glucose Monitoring Suppl (ACCU-CHEK GUIDE ME) w/Device KIT Use to test BG tid. Dx E11.65   furosemide (LASIX) 20 MG tablet Take 1 tablet (20 mg total) by mouth daily. With 40 mg tablet to equal 60 mg daily. Can Do additional 20 mg daily for SOB as needed   furosemide (LASIX) 40 MG tablet Take 1 tablet (40 mg total) by mouth daily. With 20 mg tablet to equal 60 mg daily. Can Do additional 20 mg daily for SOB as needed   glipiZIDE (GLUCOTROL XL) 5 MG 24 hr tablet Take 1 tablet (5 mg total) by mouth daily with breakfast.   glucose blood (ACCU-CHEK GUIDE) test strip Use to test BG tid. E11.65   insulin isophane & regular human KwikPen (NOVOLIN 70/30 KWIKPEN) (70-30) 100 UNIT/ML KwikPen Inject 75 Units into the skin 2 (two) times daily before a meal.   Lancets  MISC For Accu-chek smartview E11.65 Use to test tid.   metFORMIN (GLUCOPHAGE-XR) 500 MG 24 hr tablet Take 2 tablets (1,000 mg total) by mouth daily with breakfast.   metoprolol succinate (TOPROL-XL) 100 MG 24 hr tablet Take 1 tablet (100 mg total) by mouth daily.   potassium chloride SA (KLOR-CON M) 20 MEQ tablet Take 2 tablets (40 mEq total) by mouth daily.   sacubitril-valsartan (ENTRESTO) 97-103 MG Take 1 tablet by mouth 2 (two) times daily.   sertraline (ZOLOFT) 50 MG tablet Take 1 tablet (50 mg total) by mouth daily.   spironolactone (ALDACTONE) 25 MG tablet Take 12.5 mg by mouth daily.   [DISCONTINUED] glipiZIDE (GLUCOTROL XL) 5 MG 24 hr tablet Take 1 tablet (5 mg total) by mouth daily with breakfast.   [DISCONTINUED] insulin isophane & regular human KwikPen (NOVOLIN 70/30 KWIKPEN) (70-30) 100 UNIT/ML KwikPen INJECT 75 UNITS WITH BREAKFAST AND 75 UNITS WITH SUPPER IF GLUCOSE IS ABOVE 90 AND SHE IS EATING   [DISCONTINUED] metFORMIN (GLUCOPHAGE-XR) 500 MG 24 hr tablet Take 2 tablets (1,000 mg total) by mouth daily with breakfast.   No facility-administered encounter medications on file as of 02/10/2023.    ALLERGIES: Allergies  Allergen Reactions   Aspartame And Phenylalanine Dermatitis    VACCINATION STATUS: Immunization History  Administered Date(s) Administered   Influenza Split 02/02/2013   Influenza, Seasonal, Injecte, Preservative Fre 01/27/2023   Influenza,inj,Quad PF,6+ Mos 01/11/2014, 12/15/2014, 12/15/2015, 01/03/2017, 01/28/2018, 02/03/2019, 02/17/2020, 02/16/2021, 12/28/2021   Influenza-Unspecified 03/08/2011   Pneumococcal Polysaccharide-23 03/30/2009   Td 02/04/2006   Tdap 02/26/2018    Diabetes She presents for her follow-up diabetic visit. She has type 2 diabetes mellitus. Onset time: She was diagnosed at approximate age of 29 years. Her disease course has been worsening. There are no hypoglycemic associated symptoms. Pertinent negatives for hypoglycemia include no  confusion, headaches, pallor or seizures. Associated symptoms include blurred vision, fatigue, polydipsia, polyuria, visual change and weight loss. Pertinent negatives for diabetes include no chest pain and no polyphagia. There are no hypoglycemic complications. Symptoms are stable. Diabetic complications include heart disease. Risk factors for coronary artery disease include diabetes mellitus, dyslipidemia, hypertension, family history, sedentary lifestyle and stress. Current diabetic treatment includes insulin injections and oral agent (dual therapy). She is compliant with treatment some of the time (often missed her second dose of insulin). Her  weight is fluctuating minimally. She is following a generally unhealthy diet. When asked about meal planning, she reported none. She has had a previous visit with a dietitian. She never participates in exercise. Her home blood glucose trend is increasing rapidly. Her overall blood glucose range is >200 mg/dl. (She presents today with her meter, no logs showing gross hyperglycemia overall.  Her POCT A1c today is 12% increasing from last visit of 9.4%.  She notes she recently starting working again and has been more stressed.  She notes she has follow up coming up with her cardiologist, has still not been taking her medications correctly.  She just recently switched to the ER form of her Metformin and Glipizide and she continues to forget to take her second dose of insulin.  Analysis of her meter shows 7-day average of 326 with 5 readings; 14-day average of 336 with 10 readings; 30-day average of 365 with 15 readings; 90-day average of 365 with 15 readings.) An ACE inhibitor/angiotensin II receptor blocker is being taken. She does not see a podiatrist.Eye exam is current.  Hyperlipidemia This is a chronic problem. The current episode started more than 1 year ago. The problem is uncontrolled. Recent lipid tests were reviewed and are high. Exacerbating diseases include  diabetes. Factors aggravating her hyperlipidemia include beta blockers and fatty foods. Pertinent negatives include no chest pain, myalgias or shortness of breath. Current antihyperlipidemic treatment includes statins. Compliance problems include adherence to diet and adherence to exercise.  Risk factors for coronary artery disease include dyslipidemia, diabetes mellitus, hypertension, a sedentary lifestyle and family history.  Hypertension This is a chronic problem. The current episode started more than 1 year ago. The problem has been gradually improving since onset. The problem is controlled. Associated symptoms include blurred vision. Pertinent negatives include no chest pain, headaches, palpitations or shortness of breath. There are no associated agents to hypertension. Risk factors for coronary artery disease include diabetes mellitus, dyslipidemia, family history and sedentary lifestyle. Past treatments include beta blockers, diuretics and angiotensin blockers. The current treatment provides significant improvement. There are no compliance problems.  Hypertensive end-organ damage includes CAD/MI and heart failure.     Review of systems  Constitutional: + steadily decreasing body weight,  current Body mass index is 28.28 kg/m. , no fatigue, no subjective hyperthermia, no subjective hypothermia Eyes: no blurry vision, no xerophthalmia ENT: no sore throat, no nodules palpated in throat, no dysphagia/odynophagia, no hoarseness Cardiovascular: no chest pain, some intermittent SOB r/t CHF, no palpitations, no leg swelling Respiratory: no cough, some intermittent shortness of breath r/t CHF Gastrointestinal: no nausea/vomiting/diarrhea Musculoskeletal: no muscle/joint aches Skin: no rashes, no hyperemia,  Neurological: no tremors, no numbness, no tingling, no dizziness Psychiatric: + depression, + intermittent anxiety, has had great amount of stress   Objective:    BP 127/82   Pulse 87   Ht  5\' 8"  (1.727 m)   Wt 186 lb (84.4 kg)   BMI 28.28 kg/m   Wt Readings from Last 3 Encounters:  02/10/23 186 lb (84.4 kg)  02/03/23 192 lb (87.1 kg)  01/27/23 186 lb (84.4 kg)    BP Readings from Last 3 Encounters:  02/10/23 127/82  02/03/23 126/84  01/27/23 137/78     Physical Exam- Limited  Constitutional:  Body mass index is 28.28 kg/m. , not in acute distress, anxious, depressed state of mind, avoids eye contact Eyes:  EOMI, no exophthalmos Musculoskeletal: no gross deformities, strength intact in all four extremities, no gross restriction of  joint movements Skin:  no rashes, no hyperemia Neurological: no tremor with outstretched hands  Diabetic Foot Exam - Simple   Simple Foot Form Diabetic Foot exam was performed with the following findings: Yes 02/10/2023 11:21 AM  Visual Inspection No deformities, no ulcerations, no other skin breakdown bilaterally: Yes See comments: Yes Sensation Testing Intact to touch and monofilament testing bilaterally: Yes Pulse Check Posterior Tibialis and Dorsalis pulse intact bilaterally: Yes Comments Diminished pulses bilaterally      CMP ( most recent) CMP     Component Value Date/Time   NA 135 09/16/2022 0910   NA 138 04/03/2018 1240   K 3.9 09/16/2022 0910   CL 101 09/16/2022 0910   CO2 24 09/16/2022 0910   GLUCOSE 350 (H) 09/16/2022 0910   BUN 14 09/16/2022 0910   BUN 20 04/03/2018 1240   CREATININE 0.78 09/16/2022 0910   CREATININE 0.74 04/06/2014 0934   CALCIUM 8.5 (L) 09/16/2022 0910   PROT 7.4 09/16/2022 0910   PROT 6.5 03/26/2017 0916   ALBUMIN 3.8 09/16/2022 0910   ALBUMIN 4.0 03/26/2017 0916   AST 21 09/16/2022 0910   ALT 30 09/16/2022 0910   ALKPHOS 72 09/16/2022 0910   BILITOT 0.6 09/16/2022 0910   BILITOT 0.4 03/26/2017 0916   GFRNONAA >60 09/16/2022 0910   GFRAA >60 01/06/2020 1053     Diabetic Labs (most recent): Lab Results  Component Value Date   HGBA1C 12.0 (A) 02/10/2023   HGBA1C 9.4 (H)  09/16/2022   HGBA1C 11.0 (A) 02/18/2022   MICROALBUR 13.0 (H) 09/16/2022   MICROALBUR 21.8 (H) 05/12/2020   MICROALBUR 33.1 (H) 09/29/2018     Lipid Panel ( most recent) Lipid Panel     Component Value Date/Time   CHOL 194 09/16/2022 0911   CHOL 202 (H) 03/26/2017 0916   TRIG 111 09/16/2022 0911   HDL 39 (L) 09/16/2022 0911   HDL 45 03/26/2017 0916   CHOLHDL 5.0 09/16/2022 0911   VLDL 22 09/16/2022 0911   LDLCALC 133 (H) 09/16/2022 0911   LDLCALC 134 (H) 03/26/2017 0916      Assessment & Plan:   1) Uncontrolled type 2 diabetes mellitus with hyperglycemia (HCC)  - [name] has currently uncontrolled symptomatic type 2 DM since 51 years of age.  She presents today with her meter, no logs showing gross hyperglycemia overall.  Her POCT A1c today is 12% increasing from last visit of 9.4%.  She notes she recently starting working again and has been more stressed.  She notes she has follow up coming up with her cardiologist, has still not been taking her medications correctly.  She just recently switched to the ER form of her Metformin and Glipizide and she continues to forget to take her second dose of insulin.  Analysis of her meter shows 7-day average of 326 with 5 readings; 14-day average of 336 with 10 readings; 30-day average of 365 with 15 readings; 90-day average of 365 with 15 readings.  her diabetes is complicated by coronary artery disease/CHF and she remains at a high risk for more acute and chronic complications which include CAD, CVA, CKD, retinopathy, and neuropathy. These are all discussed in detail with her.  - Nutritional counseling repeated at each appointment due to patients tendency to fall back in to old habits.  - The patient admits there is a room for improvement in their diet and drink choices. -  Suggestion is made for the patient to avoid simple carbohydrates from their diet  including Cakes, Sweet Desserts / Pastries, Ice Cream, Soda (diet and  regular), Sweet Tea, Candies, Chips, Cookies, Sweet Pastries, Store Bought Juices, Alcohol in Excess of 1-2 drinks a day, Artificial Sweeteners, Coffee Creamer, and "Sugar-free" Products. This will help patient to have stable blood glucose profile and potentially avoid unintended weight gain.   - I encouraged the patient to switch to unprocessed or minimally processed complex starch and increased protein intake (animal or plant source), fruits, and vegetables.   - Patient is advised to stick to a routine mealtimes to eat 3 meals a day and avoid unnecessary snacks (to snack only to correct hypoglycemia).  - I have approached her with the following individualized plan to manage diabetes and patient agrees:   -She is advised to continue current regimen of Novolog 70/30 75 units with breakfast and 75 units with supper if glucose is above 90 and she is eating but to be more consistent with taking them.  She is advised to continue Metformin 1000 mg ER daily after breakfast and Glipizide 5 mg XL daily at breakfast.  -She is urged to continue monitoring blood glucose at least three times per day, before injecting insulin (at breakfast and supper) and before bed.  She says she cannot afford the copay for test strips, she has not called her insurance to see what meter and strips they prefer.  - Patient specific target  A1c; LDL, HDL, Triglycerides, were discussed in detail.  2) Blood Pressure /Hypertension: Her blood pressure is controlled to target.  She is advised to continue Lasix 60 mg po daily, Metoprolol 200 mg po daily, and Spironolactone 12.5 mg daily.  She sees Dr. Gala Romney for CHF.  3) Lipids/Hyperlipidemia:  Her most recent lipid panel from 09/16/22 shows uncontrolled LDL of 133.  She is advised to continue Lipitor 10 mg po daily at bedtime.  Side effects and precautions discussed with her.    4)  Weight/Diet:  Her Body mass index is 28.28 kg/m.-she is a candidate for mild weight loss.  I  discussed with her the fact that loss of 5 - 10% of her  current body weight will have the most impact on her diabetes management.  CDE Consult will be initiated . Exercise, and detailed carbohydrates information provided  -  detailed on discharge instructions.  5) Chronic Care/Health Maintenance: -she is on ACEI/ARB medications and statin medications and is encouraged to initiate and continue to follow up with Ophthalmology, Dentist,  Podiatrist at least yearly or according to recommendations, and advised to  stay away from smoking. I have recommended yearly flu vaccine and pneumonia vaccine at least every 5 years; moderate intensity exercise for up to 150 minutes weekly; and  sleep for at least 7 hours a day.  - she is advised to maintain close follow up with Tommie Sams, DO for primary care needs, as well as her other providers for optimal and coordinated care.      I spent  30  minutes in the care of the patient today including review of labs from CMP, Lipids, Thyroid Function, Hematology (current and previous including abstractions from other facilities); face-to-face time discussing  her blood glucose readings/logs, discussing hypoglycemia and hyperglycemia episodes and symptoms, medications doses, her options of short and long term treatment based on the latest standards of care / guidelines;  discussion about incorporating lifestyle medicine;  and documenting the encounter. Risk reduction counseling performed per USPSTF guidelines to reduce obesity and cardiovascular risk factors.  Please refer to Patient Instructions for Blood Glucose Monitoring and Insulin/Medications Dosing Guide"  in media tab for additional information. Please  also refer to " Patient Self Inventory" in the Media  tab for reviewed elements of pertinent patient history.  [name] participated in the discussions, expressed understanding, and voiced agreement with the above plans.  All questions were answered  to her satisfaction. she is encouraged to contact clinic should she have any questions or concerns prior to her return visit.    Follow up plan: - Return in about 3 months (around 05/13/2023) for Diabetes F/U with A1c in office, No previsit labs, Bring meter and logs.  Ronny Bacon, [facility] [address] [place], [place] [address] Phone: [phone] Fax: [phone]  02/10/2023, 12:06 PM

## 2023-02-12 ENCOUNTER — Telehealth: Payer: Self-pay

## 2023-02-12 NOTE — Progress Notes (Signed)
EPIC Encounter for ICM Monitoring  Patient Name: Tara Bryant is a 52 y.o. female Date: 02/12/2023 Primary Care Physican: Tommie Sams, DO Primary Cardiologist: Branch/Bensimhon Electrophysiologist: Ladona Ridgel 04/16/2022 Weight: 195 lbs 09/16/2022 Office Weight: 193 lbs                              Attempted call to patient and unable to reach.  Left detailed message per DPR regarding transmission. Transmission reviewed.    HeartLogic Heart Failure Index 7 suggesting normal fluid levels.      Prescribed: Furosemide 40 mg Take 60 mg by mouth daily. Can take an additional 20 mg for SOB Potassium 20 mEq Take 2 tablets (40 mEq total) by mouth daily. Spironolactone 25 mg take 0.5 tablet (12.5 mg total) by mouth daily.   Labs: 11/28/2021 Creatinine 0.96, BUN 19, Potassium 3.3, Sodium 140, GFR >60 A complete set of results can be found in Results Review.   Recommendations:  Left voice mail with ICM number and encouraged to call if experiencing any fluid symptoms.   Follow-up plan: ICM clinic phone appointment on 03/17/2023.   91 day device clinic remote transmission 05/02/2023.              EP/Cardiology next office visit:  03/10/2023 with Sharlene Dory, NP.   Recall 08/09/2023 with Dr Ladona Ridgel.    Recall 05/01/2020 with Dr Gala Romney.  10/16/2021 was last visit with Dr Wyline Mood and aware to to reestablish with Dr Gala Romney (Recall 05/01/2020).          Copy of ICM check sent to Dr. Ladona Ridgel.  3 Month HeartLogicT Heart Failure Index:    8 Day Data Trend:          Karie Soda, RN 02/12/2023 9:38 AM

## 2023-02-12 NOTE — Telephone Encounter (Signed)
Remote ICM transmission received.  Attempted call to patient regarding ICM remote transmission and left detailed message per DPR.  Left ICM phone number and advised to return call for any fluid symptoms or questions. Next ICM remote transmission scheduled 03/17/2023.

## 2023-02-17 NOTE — Progress Notes (Signed)
Remote ICD transmission.   

## 2023-02-19 ENCOUNTER — Other Ambulatory Visit: Payer: Self-pay

## 2023-02-19 DIAGNOSIS — I83813 Varicose veins of bilateral lower extremities with pain: Secondary | ICD-10-CM

## 2023-02-27 LAB — COLOGUARD

## 2023-03-09 ENCOUNTER — Other Ambulatory Visit: Payer: Self-pay | Admitting: Family Medicine

## 2023-03-09 DIAGNOSIS — E782 Mixed hyperlipidemia: Secondary | ICD-10-CM

## 2023-03-10 ENCOUNTER — Ambulatory Visit: Payer: 59 | Admitting: Nurse Practitioner

## 2023-03-17 ENCOUNTER — Ambulatory Visit: Payer: 59 | Attending: Internal Medicine

## 2023-03-17 DIAGNOSIS — Z95810 Presence of automatic (implantable) cardiac defibrillator: Secondary | ICD-10-CM

## 2023-03-17 DIAGNOSIS — I5022 Chronic systolic (congestive) heart failure: Secondary | ICD-10-CM | POA: Diagnosis not present

## 2023-03-18 ENCOUNTER — Telehealth: Payer: Self-pay

## 2023-03-18 NOTE — Progress Notes (Signed)
EPIC Encounter for ICM Monitoring  Patient Name: Tara Bryant is a 52 y.o. female Date: 03/18/2023 Primary Care Physican: Tommie Sams, DO Primary Cardiologist: Wyline Mood Electrophysiologist: Ladona Ridgel 04/16/2022 Weight: 195 lbs 09/16/2022 Office Weight: 193 lbs                              Attempted call to patient and unable to reach.  Left detailed message per DPR regarding transmission.  Transmission results reviewed.    HeartLogic Heart Failure Index 6 suggesting normal fluid levels.      Prescribed: Furosemide 40 mg Take 60 mg by mouth daily. Can take an additional 20 mg for SOB Potassium 20 mEq Take 2 tablets (40 mEq total) by mouth daily. Spironolactone 25 mg take 0.5 tablet (12.5 mg total) by mouth daily.   Labs: 11/28/2021 Creatinine 0.96, BUN 19, Potassium 3.3, Sodium 140, GFR >60 A complete set of results can be found in Results Review.   Recommendations:  Left voice mail with ICM number and encouraged to call if experiencing any fluid symptoms.   Follow-up plan: ICM clinic phone appointment on 04/21/2023.   91 day device clinic remote transmission 05/02/2023.              EP/Cardiology next office visit:  04/07/2023 with Sharlene Dory, NP.   Recall 08/09/2023 with Dr Ladona Ridgel.              Copy of ICM check sent to Dr. Ladona Ridgel.  3 Month HeartLogicT Heart Failure Index:    8 Day Data Trend:          Karie Soda, RN 03/18/2023 8:01 AM

## 2023-03-18 NOTE — Telephone Encounter (Signed)
 Remote ICM transmission received.  Attempted call to patient regarding ICM remote transmission and left detailed message per DPR.  Left ICM phone number and advised to return call for any fluid symptoms or questions. Next ICM remote transmission scheduled 04/21/2023.

## 2023-04-07 ENCOUNTER — Ambulatory Visit: Payer: 59 | Attending: Nurse Practitioner | Admitting: Nurse Practitioner

## 2023-04-07 VITALS — BP 128/84 | HR 90 | Ht 68.0 in | Wt 191.0 lb

## 2023-04-07 DIAGNOSIS — I10 Essential (primary) hypertension: Secondary | ICD-10-CM | POA: Diagnosis not present

## 2023-04-07 DIAGNOSIS — I2510 Atherosclerotic heart disease of native coronary artery without angina pectoris: Secondary | ICD-10-CM

## 2023-04-07 DIAGNOSIS — I428 Other cardiomyopathies: Secondary | ICD-10-CM | POA: Diagnosis not present

## 2023-04-07 DIAGNOSIS — I5022 Chronic systolic (congestive) heart failure: Secondary | ICD-10-CM | POA: Diagnosis not present

## 2023-04-07 DIAGNOSIS — E785 Hyperlipidemia, unspecified: Secondary | ICD-10-CM

## 2023-04-07 MED ORDER — APIXABAN 5 MG PO TABS: 5.0000 mg | ORAL_TABLET | Freq: Two times a day (BID) | ORAL | 2 refills | Status: DC

## 2023-04-07 NOTE — Patient Instructions (Addendum)
Medication Instructions:  Your physician has recommended you make the following change in your medication:  Please start Eliquis 5 mg twice daily   Labwork: In 1 week   Testing/Procedures: Your physician has requested that you have an echocardiogram. Echocardiography is a painless test that uses sound waves to create images of your heart. It provides your doctor with information about the size and shape of your heart and how well your heart's chambers and valves are working. This procedure takes approximately one hour. There are no restrictions for this procedure. Please do NOT wear cologne, perfume, aftershave, or lotions (deodorant is allowed). Please arrive 15 minutes prior to your appointment time.  Please note: We ask at that you not bring children with you during ultrasound (echo/ vascular) testing. Due to room size and safety concerns, children are not allowed in the ultrasound rooms during exams. Our front office staff cannot provide observation of children in our lobby area while testing is being conducted. An adult accompanying a patient to their appointment will only be allowed in the ultrasound room at the discretion of the ultrasound technician under special circumstances. We apologize for any inconvenience.  Follow-Up: Your physician recommends that you schedule a follow-up appointment in: 4-6 weeks   Any Other Special Instructions Will Be Listed Below (If Applicable). Gave patient eliquis free trial offer and copay card   If you need a refill on your cardiac medications before your next appointment, please call your pharmacy.

## 2023-04-07 NOTE — Progress Notes (Signed)
Cardiology Office Note:  .   Date:  04/07/2023  ID:  Tara Bryant, DOB 09/27/1970, MRN [id number] PCP: Tommie Sams DO  [facility] Providers Cardiologist:  Dina Rich, MD Advanced Heart Failure:  Arvilla Meres, MD    History of Present Illness: .   Tara Bryant is a 52 y.o. female with a PMH of CAD, nonischemic cardiomyopathy, noncompaction cardiomyopathy, chronic systolic CHF, s/p ICD, hypertension, hyperlipidemia, obesity, type 2 diabetes, sleep apnea, pulmonary hypertension, and medication noncompliance, who presents today for regular follow-up.  She is followed by Dr. Ladona Ridgel for her ICD.  Patient is on anticoagulation in the setting of noncompaction and low EF history.  Last seen by Dr. Wyline Mood on November 30, 2021.  At the time, patient had stopped her home medications and was overwhelmed with stress related to separation and moving out of her house at the time.  Dr. Wyline Mood stated would restart and titrate medications over time.  Toprol was increased to 100 mg daily, Entresto to 97/103 mg twice daily, patient remained hesitant at the time to start Farxiga due to yeast infections at times although past report from her OB/GYN stated was okay to try.  Nursing visit was arranged after office visit and if vitals look good, was recommended to increase Toprol 250 mg daily with close follow-up of labs.  Today she presents for regular follow-up.  She states she is not taking Toprol XL, potassium supplement, Entresto, Zoloft or Aldactone, nor is she taking Eliquis. Says she stopped these on her own. Says she has seen a therapist in the past. Denies any chest pain, shortness of breath, palpitations, syncope, presyncope, orthopnea, PND, swelling or significant weight changes, acute bleeding, or claudication. Admits to a feeling of dizziness when standing in the same position for too long. 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   Sister, stroke in 2015. Bed ridden after CVA. Father died in 30 - kidney failure, used to smoke. Mother is still living, thinks she has A-fib and says she has leg swelling.   SH: Used to work for [employer] in [place], sports. Daughter is still attending [employer].   ROS: Negative. See HPI.   Studies Reviewed: Marland Kitchen   EKG: EKG is not ordered today.   Echo 11/2019:  1. EF similar to that seen on echo done May 2020 . Left ventricular  ejection fraction, by estimation, is 25 to 30%. The left ventricle has  severely decreased function. The left ventricle demonstrates global  hypokinesis. The left ventricular internal  cavity size was moderately dilated. There is mild asymmetric left  ventricular hypertrophy of the basal and septal segments. Left ventricular  diastolic parameters were normal.   2. AICD wires in RA/RV. Right ventricular systolic function is normal.  The right ventricular size is normal. There is moderately elevated  pulmonary artery systolic pressure.   3. Left atrial size was mildly dilated.   4. The mitral valve is normal in structure. Mild mitral valve  regurgitation. No evidence of mitral stenosis.   5. Tricuspid valve regurgitation is mild to moderate.   6. The aortic valve is normal in structure. Aortic valve regurgitation is  not visualized. No aortic stenosis is present.   7. The inferior vena cava is normal in size with greater than 50%  respiratory variability, suggesting right atrial pressure  of 3 mmHg.   Risk Assessment/Calculations:       The 10-year ASCVD risk score (Arnett DK, et al., 2019) is: 5.9%   Values used to calculate the score:     Age: 76 years     Sex: Female     Is Non-Hispanic African American: No     Diabetic: Yes     Tobacco smoker: No     Systolic Blood Pressure: 128 mmHg     Is BP treated: Yes     HDL Cholesterol: 39 mg/dL     Total Cholesterol: 194 mg/dL       Physical Exam:   VS:  BP 128/84 (BP Location: Right Arm, Cuff Size: Normal)   Pulse 90   Ht 5\' 8"  (1.727 m)   Wt 191 lb (86.6 kg)   SpO2 98%   BMI 29.04 kg/m    Wt Readings from Last 3 Encounters:  04/07/23 191 lb (86.6 kg)  02/10/23 186 lb (84.4 kg)  02/03/23 192 lb (87.1 kg)    GEN: Well nourished, well developed in no acute distress NECK: No JVD; No carotid bruits CARDIAC: S1/S2, RRR, no murmurs, rubs, gallops RESPIRATORY:  Clear to auscultation without rales, wheezing or rhonchi  ABDOMEN: Soft, non-tender, non-distended EXTREMITIES:  No edema; No deformity  PSYCH: Calm, cooperative, flat affect  ASSESSMENT AND PLAN: .    Nonischemic cardiomyopathy, noncompaction cardiomyopathy, chronic systolic CHF, s/p ICD Stage C, NYHA class I symptoms. EF 25-30% in August 2021. Euvolemic and well compensated on exam. No longer taking her previously prescribed cardiac medications. Will adjust GDMT over time. Will start Eliquis 5 mg BID for stroke prevention. Will obtain BMET, Mag, and CBC in 1 week for medication management. If kidney function allows, will plan to start either ARB/SGLT2i. Low sodium diet, fluid restriction <2L, and daily weights encouraged. Educated to contact our office for weight gain of 2 lbs overnight or 5 lbs in one week. Denies any shocks. Last remote device check revealed normal device function. Continue to follow-up with EP.  Genetic testing discussed with patient. She verbalized understanding. Will update Echo at this time.   2. CAD Stable with no anginal symptoms. No indication for ischemic evaluation. Not on aspirin, okay to start Eliquis as mentioned above. Continue atorvastatin. Heart healthy diet and regular cardiovascular exercise encouraged. Care and ED precautions discussed.  3. HTN BP stable. Discussed to monitor BP at home at least 2 hours after medications and sitting for 5-10 minutes. Dizziness at work if felt to be due to dehydration. Encouraged adequate  water intake while at work and encouraged small snack during break in AM. She verbalized understanding. Heart healthy diet and regular cardiovascular exercise encouraged. No medication changes at this time - see above.   4. HLD LDL from 09/2022 was 133. I question the compliance with atorvastatin. Continue atorvastatin. Continue to follow-up with Endo and PCP. Heart healthy diet and regular cardiovascular exercise encouraged.   Dispo: Follow-up with me/APP in 4-6 weeks or sooner if anything changes.   Signed, Sharlene Dory, NP

## 2023-04-21 ENCOUNTER — Ambulatory Visit: Payer: 59 | Attending: Internal Medicine

## 2023-04-21 ENCOUNTER — Ambulatory Visit: Payer: 59 | Attending: Nurse Practitioner

## 2023-04-21 DIAGNOSIS — I5022 Chronic systolic (congestive) heart failure: Secondary | ICD-10-CM

## 2023-04-21 DIAGNOSIS — Z95810 Presence of automatic (implantable) cardiac defibrillator: Secondary | ICD-10-CM

## 2023-04-21 DIAGNOSIS — I2510 Atherosclerotic heart disease of native coronary artery without angina pectoris: Secondary | ICD-10-CM

## 2023-04-21 LAB — ECHOCARDIOGRAM COMPLETE
AR max vel: 2.25 cm2
AV Area VTI: 1.84 cm2
AV Area mean vel: 1.89 cm2
AV Mean grad: 4 mm[Hg]
AV Peak grad: 5.9 mm[Hg]
Ao pk vel: 1.21 m/s
Area-P 1/2: 6.02 cm2
Calc EF: 25 %
MV VTI: 1.96 cm2
S' Lateral: 4.5 cm
Single Plane A2C EF: 25.3 %
Single Plane A4C EF: 25.1 %

## 2023-04-22 NOTE — Progress Notes (Signed)
 EPIC Encounter for ICM Monitoring  Patient Name: Tara Bryant is a 53 y.o. female Date: 04/22/2023 Primary Care Physican: Cook, Jayce G, DO Primary Cardiologist: Alvan Electrophysiologist: Waddell 04/16/2022 Weight: 195 lbs 09/16/2022 Office Weight: 193 lbs                              Transmission results reviewed.    [employer] Heart Failure Index 0 suggesting fluid levels within normal threshold.      Prescribed: Furosemide  40 mg Take 60 mg by mouth daily. Can take an additional 20 mg for SOB Potassium 20 mEq Take 2 tablets (40 mEq total) by mouth daily. Spironolactone  25 mg take 0.5 tablet (12.5 mg total) by mouth daily.   Labs: 09/16/2022 Creatinine 0.78, BUN 14, Potassium 3.9, Sodium 135, GFR >60 A complete set of results can be found in Results Review.   Recommendations:  No changes.   Follow-up plan: ICM clinic phone appointment on 05/26/2023.   91 day device clinic remote transmission 05/02/2023.              EP/Cardiology next office visit:    Recall 08/09/2023 with Dr Waddell.              Copy of ICM check sent to Dr. Waddell.  3 Month HeartLogicT Heart Failure Index:    8 Day Data Trend:          Tara Bryant Garner, RN 04/22/2023 10:07 AM

## 2023-05-02 ENCOUNTER — Ambulatory Visit (INDEPENDENT_AMBULATORY_CARE_PROVIDER_SITE_OTHER): Payer: 59

## 2023-05-02 ENCOUNTER — Encounter: Payer: Self-pay | Admitting: Internal Medicine

## 2023-05-02 DIAGNOSIS — I428 Other cardiomyopathies: Secondary | ICD-10-CM | POA: Diagnosis not present

## 2023-05-02 LAB — CUP PACEART REMOTE DEVICE CHECK
Battery Remaining Longevity: 132 mo
Battery Remaining Percentage: 91 %
Brady Statistic RV Percent Paced: 0 %
Date Time Interrogation Session: 20250124044100
HighPow Impedance: 80 Ohm
Implantable Lead Connection Status: 753985
Implantable Lead Implant Date: 20200723
Implantable Lead Location: 753860
Implantable Lead Model: 293
Implantable Lead Serial Number: 443823
Implantable Pulse Generator Implant Date: 20200723
Lead Channel Impedance Value: 464 Ohm
Lead Channel Setting Pacing Amplitude: 2.5 V
Lead Channel Setting Pacing Pulse Width: 0.4 ms
Lead Channel Setting Sensing Sensitivity: 0.5 mV
Pulse Gen Serial Number: 256025
Zone Setting Status: 755011

## 2023-05-16 ENCOUNTER — Other Ambulatory Visit: Payer: Self-pay | Admitting: Nurse Practitioner

## 2023-05-16 ENCOUNTER — Other Ambulatory Visit (HOSPITAL_COMMUNITY)
Admission: RE | Admit: 2023-05-16 | Discharge: 2023-05-16 | Disposition: A | Discharge status: Home / Self Care | Payer: 59 | Source: Ambulatory Visit | Attending: Nurse Practitioner | Admitting: Nurse Practitioner

## 2023-05-16 DIAGNOSIS — I5022 Chronic systolic (congestive) heart failure: Secondary | ICD-10-CM | POA: Diagnosis not present

## 2023-05-16 DIAGNOSIS — I2510 Atherosclerotic heart disease of native coronary artery without angina pectoris: Secondary | ICD-10-CM | POA: Insufficient documentation

## 2023-05-16 LAB — BASIC METABOLIC PANEL
Anion gap: 11 (ref 5–15)
BUN: 21 mg/dL — ABNORMAL HIGH (ref 6–20)
CO2: 25 mmol/L (ref 22–32)
Calcium: 9 mg/dL (ref 8.9–10.3)
Chloride: 98 mmol/L (ref 98–111)
Creatinine, Ser: 0.93 mg/dL (ref 0.44–1.00)
GFR, Estimated: >60 mL/min (ref >60)
Glucose, Bld: 437 mg/dL — ABNORMAL HIGH (ref 70–99)
Potassium: 4.2 mmol/L (ref 3.5–5.1)
Sodium: 134 mmol/L — ABNORMAL LOW (ref 135–145)

## 2023-05-16 LAB — CBC
HCT: 40.5 % (ref 36.0–46.0)
Hemoglobin: 13.7 g/dL (ref 12.0–15.0)
MCH: 31 pg (ref 26.0–34.0)
MCHC: 33.8 g/dL (ref 30.0–36.0)
MCV: 91.6 fL (ref 80.0–100.0)
Platelets: 205 10*3/uL (ref 150–400)
RBC: 4.42 MIL/uL (ref 3.87–5.11)
RDW: 12.5 % (ref 11.5–15.5)
WBC: 6.9 10*3/uL (ref 4.0–10.5)
nRBC: 0 % (ref 0.0–0.2)

## 2023-05-16 LAB — MAGNESIUM: Magnesium: 1.5 mg/dL — ABNORMAL LOW (ref 1.7–2.4)

## 2023-05-19 ENCOUNTER — Ambulatory Visit: Payer: 59 | Admitting: Nurse Practitioner

## 2023-05-19 ENCOUNTER — Encounter: Payer: Self-pay | Admitting: Nurse Practitioner

## 2023-05-19 VITALS — BP 132/78 | HR 105 | Ht 68.0 in | Wt 196.6 lb

## 2023-05-19 DIAGNOSIS — Z794 Long term (current) use of insulin: Secondary | ICD-10-CM

## 2023-05-19 DIAGNOSIS — Z91148 Patient's other noncompliance with medication regimen for other reason: Secondary | ICD-10-CM | POA: Diagnosis not present

## 2023-05-19 DIAGNOSIS — E782 Mixed hyperlipidemia: Secondary | ICD-10-CM

## 2023-05-19 DIAGNOSIS — Z7984 Long term (current) use of oral hypoglycemic drugs: Secondary | ICD-10-CM | POA: Diagnosis not present

## 2023-05-19 DIAGNOSIS — E559 Vitamin D deficiency, unspecified: Secondary | ICD-10-CM

## 2023-05-19 DIAGNOSIS — I10 Essential (primary) hypertension: Secondary | ICD-10-CM | POA: Diagnosis not present

## 2023-05-19 DIAGNOSIS — E1165 Type 2 diabetes mellitus with hyperglycemia: Secondary | ICD-10-CM

## 2023-05-19 LAB — POCT GLYCOSYLATED HEMOGLOBIN (HGB A1C): Hemoglobin A1C: 10.7 % — AB (ref 4.0–5.6)

## 2023-05-19 NOTE — Progress Notes (Signed)
05/20/2023, 7:52 AM  Endocrinology follow-up note                              Subjective:    [name], female    DOB: 10/19/1970.  Tara Bryant is being seen in follow-up in  management of currently uncontrolled symptomatic diabetes requested by  Tommie Sams, DO.   Past Medical History:  Diagnosis Date   AICD (automatic cardioverter/defibrillator) present    Arrhythmia    Arthritis    CAD (coronary artery disease)    a. 03/2018: cath showing 60% mid-LAD stenosis not significant by FFR.    Chronic combined systolic (congestive) and diastolic (congestive) heart failure (HCC)    a. 03/2018: found to have a newly reduced EF of 20% by echocardiogram   Depression    denies   Diabetes mellitus    GERD (gastroesophageal reflux disease)    h/o of   Hypertension    Microproteinuria    S/P thyroid biopsy 2020   follow up once a year,    Sleep apnea    Past Surgical History:  Procedure Laterality Date   ANTERIOR CERVICAL DECOMP/DISCECTOMY FUSION N/A 12/03/2019   Procedure: ANTERIOR CERVICAL DECOMPRESSION/DISCECTOMY FUSION CERVICAL SIX- CERVICAL SEVEN;  Surgeon: Lisbeth Renshaw, MD;  Location: [facility] OR;  Service: Neurosurgery;  Laterality: N/A;  ANTERIOR CERVICAL DECOMPRESSION/DISCECTOMY FUSION CERVICAL SIX- CERVICAL SEVEN   BACK SURGERY  2010   BREAST BIOPSY Left 2017   benign   CARDIAC CATHETERIZATION     CORONARY PRESSURE/FFR STUDY N/A 03/24/2018   Procedure: INTRAVASCULAR PRESSURE WIRE/FFR STUDY;  Surgeon: Marykay Lex, MD;  Location: [facility] INVASIVE CV LAB;  Service: Cardiovascular;  Laterality: N/A;   ICD IMPLANT N/A 10/29/2018   Procedure: ICD IMPLANT;  Surgeon: Marinus Maw, MD;  Location: [facility] INVASIVE CV LAB;  Service: Cardiovascular;  Laterality: N/A;   NOSE SURGERY     RIGHT/LEFT HEART CATH AND CORONARY ANGIOGRAPHY N/A 03/24/2018   Procedure: RIGHT/LEFT HEART CATH AND  CORONARY ANGIOGRAPHY;  Surgeon: Marykay Lex, MD;  Location: [facility] INVASIVE CV LAB;  Service: Cardiovascular;  Laterality: N/A;   TONSILLECTOMY     TOOTH EXTRACTION     September 2022   WISDOM TOOTH EXTRACTION     Social History   Socioeconomic History   Marital status: Divorced    Spouse name: Not on file   Number of children: 1   Years of education: Not on file   Highest education level: Not on file  Occupational History   Occupation: Home maker  Tobacco Use   Smoking status: Never   Smokeless tobacco: Never  Vaping Use   Vaping status: Never Used  Substance and Sexual Activity   Alcohol use: No   Drug use: No   Sexual activity: Yes    Birth control/protection: Pill  Other Topics Concern   Not on file  Social History Narrative   Not on file   Social Drivers of Health   Financial Resource Strain: Not on file  Food Insecurity: Not on file  Transportation Needs: Not on file  Physical  Activity: Not on file  Stress: Not on file  Social Connections: Not on file   Outpatient Encounter Medications as of 05/19/2023  Medication Sig   apixaban (ELIQUIS) 5 MG TABS tablet Take 1 tablet (5 mg total) by mouth 2 (two) times daily.   atorvastatin (LIPITOR) 10 MG tablet Take 1 tablet (10 mg total) by mouth daily.   B-D ULTRAFINE III SHORT PEN 31G X 8 MM MISC USE TO INJECT INSULIN TWICE DAILY   Blood Glucose Monitoring Suppl (ACCU-CHEK GUIDE ME) w/Device KIT Use to test BG tid. Dx E11.65   furosemide (LASIX) 20 MG tablet Take 1 tablet (20 mg total) by mouth daily. With 40 mg tablet to equal 60 mg daily. Can Do additional 20 mg daily for SOB as needed   furosemide (LASIX) 40 MG tablet Take 1 tablet (40 mg total) by mouth daily. With 20 mg tablet to equal 60 mg daily. Can Do additional 20 mg daily for SOB as needed   glipiZIDE (GLUCOTROL XL) 5 MG 24 hr tablet Take 1 tablet (5 mg total) by mouth daily with breakfast.   glucose blood (ACCU-CHEK GUIDE) test strip Use to test BG tid. E11.65    insulin isophane & regular human KwikPen (NOVOLIN 70/30 KWIKPEN) (70-30) 100 UNIT/ML KwikPen Inject 75 Units into the skin 2 (two) times daily before a meal.   Lancets MISC For Accu-chek smartview E11.65 Use to test tid.   metFORMIN (GLUCOPHAGE-XR) 500 MG 24 hr tablet Take 2 tablets (1,000 mg total) by mouth daily with breakfast.   metoprolol succinate (TOPROL-XL) 100 MG 24 hr tablet Take 1 tablet (100 mg total) by mouth daily.   potassium chloride SA (KLOR-CON M) 20 MEQ tablet Take 2 tablets (40 mEq total) by mouth daily. (Patient not taking: Reported on 05/19/2023)   sacubitril-valsartan (ENTRESTO) 97-103 MG Take 1 tablet by mouth 2 (two) times daily. (Patient not taking: Reported on 05/19/2023)   sertraline (ZOLOFT) 50 MG tablet Take 1 tablet (50 mg total) by mouth daily. (Patient not taking: Reported on 05/19/2023)   spironolactone (ALDACTONE) 25 MG tablet Take 12.5 mg by mouth daily. (Patient not taking: Reported on 05/19/2023)   No facility-administered encounter medications on file as of 05/19/2023.    ALLERGIES: Allergies  Allergen Reactions   Aspartame And Phenylalanine Dermatitis    VACCINATION STATUS: Immunization History  Administered Date(s) Administered   Influenza Split 02/02/2013   Influenza, Seasonal, Injecte, Preservative Fre 01/27/2023   Influenza,inj,Quad PF,6+ Mos 01/11/2014, 12/15/2014, 12/15/2015, 01/03/2017, 01/28/2018, 02/03/2019, 02/17/2020, 02/16/2021, 12/28/2021   Influenza-Unspecified 03/08/2011   Pneumococcal Polysaccharide-23 03/30/2009   Td 02/04/2006   Tdap 02/26/2018    Diabetes She presents for her follow-up diabetic visit. She has type 2 diabetes mellitus. Onset time: She was diagnosed at approximate age of 29 years. Her disease course has been worsening. There are no hypoglycemic associated symptoms. Pertinent negatives for hypoglycemia include no confusion, headaches, pallor or seizures. Associated symptoms include blurred vision, fatigue,  polydipsia, polyuria, visual change and weight loss. Pertinent negatives for diabetes include no chest pain and no polyphagia. There are no hypoglycemic complications. Symptoms are stable. Diabetic complications include heart disease. Risk factors for coronary artery disease include diabetes mellitus, dyslipidemia, hypertension, family history, sedentary lifestyle and stress. Current diabetic treatment includes insulin injections and oral agent (dual therapy). She is compliant with treatment some of the time (often missed her second dose of insulin). Her weight is fluctuating minimally. She is following a generally unhealthy diet. When asked about meal planning,  she reported none. She has had a previous visit with a dietitian. She never participates in exercise. Her home blood glucose trend is fluctuating minimally. Her breakfast blood glucose range is generally >200 mg/dl. Her overall blood glucose range is >200 mg/dl. (She presents today with her meter, no logs showing inconsistent glucose monitoring with gross hyperglycemia overall.  Her POCT A1c today is 10.7% improving from last visit of 12%.   Analysis of her meter shows 7-day average of 363 with 5 readings; 14-day average of 360 with 11 readings; 30-day average of 320 with 24 readings; 90-day average of 311 with 72 readings.  She continues to miss her second dose of insulin daily due to work.  She notes she was recently let go from her job.) An ACE inhibitor/angiotensin II receptor blocker is being taken. She does not see a podiatrist.Eye exam is current.  Hyperlipidemia This is a chronic problem. The current episode started more than 1 year ago. The problem is uncontrolled. Recent lipid tests were reviewed and are high. Exacerbating diseases include diabetes. Factors aggravating her hyperlipidemia include beta blockers and fatty foods. Pertinent negatives include no chest pain, myalgias or shortness of breath. Current antihyperlipidemic treatment includes  statins. Compliance problems include adherence to diet and adherence to exercise.  Risk factors for coronary artery disease include dyslipidemia, diabetes mellitus, hypertension, a sedentary lifestyle and family history.  Hypertension This is a chronic problem. The current episode started more than 1 year ago. The problem has been gradually improving since onset. The problem is controlled. Associated symptoms include blurred vision. Pertinent negatives include no chest pain, headaches, palpitations or shortness of breath. There are no associated agents to hypertension. Risk factors for coronary artery disease include diabetes mellitus, dyslipidemia, family history and sedentary lifestyle. Past treatments include beta blockers, diuretics and angiotensin blockers. The current treatment provides significant improvement. There are no compliance problems.  Hypertensive end-organ damage includes CAD/MI and heart failure.     Review of systems  Constitutional: + stable body weight,  current Body mass index is 29.89 kg/m. , no fatigue, no subjective hyperthermia, no subjective hypothermia Eyes: no blurry vision, no xerophthalmia ENT: no sore throat, no nodules palpated in throat, no dysphagia/odynophagia, no hoarseness Cardiovascular: no chest pain, some intermittent SOB r/t CHF, no palpitations, no leg swelling Respiratory: no cough, some intermittent shortness of breath r/t CHF Gastrointestinal: no nausea/vomiting/diarrhea Musculoskeletal: no muscle/joint aches Skin: no rashes, no hyperemia,  Neurological: no tremors, no numbness, no tingling, no dizziness Psychiatric: + depression, + intermittent anxiety, has had great amount of stress (recently lost her job)   Objective:    BP 132/78 (BP Location: Left Arm, Patient Position: Sitting, Cuff Size: Large)   Pulse (!) 105   Ht 5\' 8"  (1.727 m)   Wt 196 lb 9.6 oz (89.2 kg)   BMI 29.89 kg/m   Wt Readings from Last 3 Encounters:  05/19/23 196 lb 9.6  oz (89.2 kg)  04/07/23 191 lb (86.6 kg)  02/10/23 186 lb (84.4 kg)    BP Readings from Last 3 Encounters:  05/19/23 132/78  04/07/23 128/84  02/10/23 127/82     Physical Exam- Limited  Constitutional:  Body mass index is 29.89 kg/m. , not in acute distress, anxious, depressed state of mind, avoids eye contact Eyes:  EOMI, no exophthalmos Musculoskeletal: no gross deformities, strength intact in all four extremities, no gross restriction of joint movements Skin:  no rashes, no hyperemia Neurological: no tremor with outstretched hands  Diabetic Foot Exam -  Simple   No data filed      CMP ( most recent) CMP     Component Value Date/Time   NA 134 (L) 05/16/2023 1139   NA 138 04/03/2018 1240   K 4.2 05/16/2023 1139   CL 98 05/16/2023 1139   CO2 25 05/16/2023 1139   GLUCOSE 437 (H) 05/16/2023 1139   BUN 21 (H) 05/16/2023 1139   BUN 20 04/03/2018 1240   CREATININE 0.93 05/16/2023 1139   CREATININE 0.74 04/06/2014 0934   CALCIUM 9.0 05/16/2023 1139   PROT 7.4 09/16/2022 0910   PROT 6.5 03/26/2017 0916   ALBUMIN 3.8 09/16/2022 0910   ALBUMIN 4.0 03/26/2017 0916   AST 21 09/16/2022 0910   ALT 30 09/16/2022 0910   ALKPHOS 72 09/16/2022 0910   BILITOT 0.6 09/16/2022 0910   BILITOT 0.4 03/26/2017 0916   GFRNONAA >60 05/16/2023 1139   GFRAA >60 01/06/2020 1053     Diabetic Labs (most recent): Lab Results  Component Value Date   HGBA1C 10.7 (A) 05/19/2023   HGBA1C 12.0 (A) 02/10/2023   HGBA1C 9.4 (H) 09/16/2022   MICROALBUR 13.0 (H) 09/16/2022   MICROALBUR 21.8 (H) 05/12/2020   MICROALBUR 33.1 (H) 09/29/2018     Lipid Panel ( most recent) Lipid Panel     Component Value Date/Time   CHOL 194 09/16/2022 0911   CHOL 202 (H) 03/26/2017 0916   TRIG 111 09/16/2022 0911   HDL 39 (L) 09/16/2022 0911   HDL 45 03/26/2017 0916   CHOLHDL 5.0 09/16/2022 0911   VLDL 22 09/16/2022 0911   LDLCALC 133 (H) 09/16/2022 0911   LDLCALC 134 (H) 03/26/2017 0916       Assessment & Plan:   1) Uncontrolled type 2 diabetes mellitus with hyperglycemia (HCC)  - [name] has currently uncontrolled symptomatic type 2 DM since 53 years of age.  She presents today with her meter, no logs showing inconsistent glucose monitoring with gross hyperglycemia overall.  Her POCT A1c today is 10.7% improving from last visit of 12%.   Analysis of her meter shows 7-day average of 363 with 5 readings; 14-day average of 360 with 11 readings; 30-day average of 320 with 24 readings; 90-day average of 311 with 72 readings.  She continues to miss her second dose of insulin daily due to work.  She notes she was recently let go from her job.  her diabetes is complicated by coronary artery disease/CHF and she remains at a high risk for more acute and chronic complications which include CAD, CVA, CKD, retinopathy, and neuropathy. These are all discussed in detail with her.  - Nutritional counseling repeated at each appointment due to patients tendency to fall back in to old habits.  - The patient admits there is a room for improvement in their diet and drink choices. -  Suggestion is made for the patient to avoid simple carbohydrates from their diet including Cakes, Sweet Desserts / Pastries, Ice Cream, Soda (diet and regular), Sweet Tea, Candies, Chips, Cookies, Sweet Pastries, Store Bought Juices, Alcohol in Excess of 1-2 drinks a day, Artificial Sweeteners, Coffee Creamer, and "Sugar-free" Products. This will help patient to have stable blood glucose profile and potentially avoid unintended weight gain.   - I encouraged the patient to switch to unprocessed or minimally processed complex starch and increased protein intake (animal or plant source), fruits, and vegetables.   - Patient is advised to stick to a routine mealtimes to eat 3 meals a day and avoid  unnecessary snacks (to snack only to correct hypoglycemia).  - I have approached her with the following individualized  plan to manage diabetes and patient agrees:   -She is advised to continue current regimen of Novolog 70/30 75 units with breakfast and 75 units with supper (but to be more consistent with taking her second dose)if glucose is above 90 and she is eating but to be more consistent with taking them.  She is advised to continue Metformin 1000 mg ER daily after breakfast and Glipizide 5 mg XL daily at breakfast.  We discussed the importance of compliance to her medication regimen, I urged her to focus on her health.  -She is urged to continue monitoring blood glucose at least three times per day, before injecting insulin (at breakfast and supper) and before bed.   - Patient specific target  A1c; LDL, HDL, Triglycerides, were discussed in detail.  2) Blood Pressure /Hypertension: Her blood pressure is controlled to target.  She is advised to continue Lasix 60 mg po daily, Metoprolol 200 mg po daily, and Spironolactone 12.5 mg daily.  She sees Dr. Gala Romney for CHF.  3) Lipids/Hyperlipidemia:  Her most recent lipid panel from 09/16/22 shows uncontrolled LDL of 133.  She is advised to continue Lipitor 10 mg po daily at bedtime.  Side effects and precautions discussed with her.    4)  Weight/Diet:  Her Body mass index is 29.89 kg/m.-she is a candidate for mild weight loss.  I discussed with her the fact that loss of 5 - 10% of her  current body weight will have the most impact on her diabetes management.  CDE Consult will be initiated . Exercise, and detailed carbohydrates information provided  -  detailed on discharge instructions.  5) Chronic Care/Health Maintenance: -she is on ACEI/ARB medications and statin medications and is encouraged to initiate and continue to follow up with Ophthalmology, Dentist,  Podiatrist at least yearly or according to recommendations, and advised to  stay away from smoking. I have recommended yearly flu vaccine and pneumonia vaccine at least every 5 years; moderate intensity  exercise for up to 150 minutes weekly; and  sleep for at least 7 hours a day.  - she is advised to maintain close follow up with Tommie Sams, DO for primary care needs, as well as her other providers for optimal and coordinated care.     I spent  37  minutes in the care of the patient today including review of labs from CMP, Lipids, Thyroid Function, Hematology (current and previous including abstractions from other facilities); face-to-face time discussing  her blood glucose readings/logs, discussing hypoglycemia and hyperglycemia episodes and symptoms, medications doses, her options of short and long term treatment based on the latest standards of care / guidelines;  discussion about incorporating lifestyle medicine;  and documenting the encounter. Risk reduction counseling performed per USPSTF guidelines to reduce obesity and cardiovascular risk factors.     Please refer to Patient Instructions for Blood Glucose Monitoring and Insulin/Medications Dosing Guide"  in media tab for additional information. Please  also refer to " Patient Self Inventory" in the Media  tab for reviewed elements of pertinent patient history.  [name] participated in the discussions, expressed understanding, and voiced agreement with the above plans.  All questions were answered to her satisfaction. she is encouraged to contact clinic should she have any questions or concerns prior to her return visit.    Follow up plan: - Return in about 3 months (  around 08/16/2023) for Diabetes F/U with A1c in office, No previsit labs.  Ronny Bacon, [facility] [address] [place], [place] [address] Phone: [phone] Fax: [phone]  05/20/2023, 7:52 AM

## 2023-05-20 ENCOUNTER — Other Ambulatory Visit: Payer: Self-pay | Admitting: Nurse Practitioner

## 2023-05-20 DIAGNOSIS — Z79899 Other long term (current) drug therapy: Secondary | ICD-10-CM

## 2023-05-20 MED ORDER — MAGNESIUM OXIDE 400 MG PO TABS: 400.0000 mg | ORAL_TABLET | Freq: Every day | ORAL | 5 refills | Status: DC

## 2023-05-26 ENCOUNTER — Ambulatory Visit (INDEPENDENT_AMBULATORY_CARE_PROVIDER_SITE_OTHER): Payer: 59

## 2023-05-26 ENCOUNTER — Ambulatory Visit: Payer: 59 | Attending: Nurse Practitioner | Admitting: Nurse Practitioner

## 2023-05-26 ENCOUNTER — Encounter: Payer: Self-pay | Admitting: Nurse Practitioner

## 2023-05-26 VITALS — BP 128/72 | HR 98 | Ht 67.5 in | Wt 191.6 lb

## 2023-05-26 DIAGNOSIS — Z95810 Presence of automatic (implantable) cardiac defibrillator: Secondary | ICD-10-CM

## 2023-05-26 DIAGNOSIS — I5022 Chronic systolic (congestive) heart failure: Secondary | ICD-10-CM

## 2023-05-26 DIAGNOSIS — I2510 Atherosclerotic heart disease of native coronary artery without angina pectoris: Secondary | ICD-10-CM

## 2023-05-26 DIAGNOSIS — Z79899 Other long term (current) drug therapy: Secondary | ICD-10-CM | POA: Diagnosis not present

## 2023-05-26 DIAGNOSIS — I428 Other cardiomyopathies: Secondary | ICD-10-CM

## 2023-05-26 DIAGNOSIS — E785 Hyperlipidemia, unspecified: Secondary | ICD-10-CM

## 2023-05-26 DIAGNOSIS — I10 Essential (primary) hypertension: Secondary | ICD-10-CM

## 2023-05-26 MED ORDER — APIXABAN 5 MG PO TABS: 5.0000 mg | ORAL_TABLET | Freq: Two times a day (BID) | ORAL | Status: AC

## 2023-05-26 MED ORDER — METOPROLOL SUCCINATE ER 25 MG PO TB24: 25.0000 mg | ORAL_TABLET | Freq: Every day | ORAL | 6 refills | Status: DC

## 2023-05-26 NOTE — Patient Instructions (Addendum)
Medication Instructions:   Decrease Toprol XL to 25mg  daily  Resume Eliquis 5mg  twice a day Continue all other medications.     Labwork:  CBC, Mg - orders given  Please do in 2 weeks  Office will contact with results via phone, letter or mychart.    Testing/Procedures:  none  Follow-Up:  4-6 weeks    Any Other Special Instructions Will Be Listed Below (If Applicable).  Eliquis patient assistance info given today.   If you need a refill on your cardiac medications before your next appointment, please call your pharmacy.

## 2023-05-26 NOTE — Progress Notes (Addendum)
Cardiology Office Note:  .   Date:  05/26/2023  ID:  Tara Bryant, DOB 1970/06/25, MRN [id number] PCP: Tommie Sams DO  [facility] Providers Cardiologist:  Dina Rich, MD Advanced Heart Failure:  Arvilla Meres, MD    History of Present Illness: .   Tara Bryant is a 53 y.o. female with a PMH of CAD, nonischemic cardiomyopathy, noncompaction cardiomyopathy, chronic systolic CHF, s/p ICD, hypertension, hyperlipidemia, obesity, type 2 diabetes, sleep apnea, pulmonary hypertension, and medication noncompliance, who presents today for regular follow-up.  She is followed by Dr. Ladona Ridgel for her ICD.  Patient is on anticoagulation in the setting of noncompaction and low EF history.  Last seen by Dr. Wyline Mood on November 30, 2021.  At the time, patient had stopped her home medications and was overwhelmed with stress related to separation and moving out of her house at the time.  Dr. Wyline Mood stated would restart and titrate medications over time.  Toprol was increased to 100 mg daily, Entresto to 97/103 mg twice daily, patient remained hesitant at the time to start Farxiga due to yeast infections at times although past report from her OB/GYN stated was okay to try.  Nursing visit was arranged after office visit and if vitals look good, was recommended to increase Toprol 250 mg daily with close follow-up of labs.  04/07/2023 - Today she presents for regular follow-up.  She states she is not taking Toprol XL, potassium supplement, Entresto, Zoloft or Aldactone, nor is she taking Eliquis. Says she stopped these on her own. Says she has seen a therapist in the past. Denies any chest pain, shortness of breath, palpitations, syncope, presyncope, orthopnea, PND, swelling or significant weight changes, acute bleeding, or claudication. Admits to a feeling of dizziness when standing in the same position for too long. 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   May 26, 2023- Presents today for follow-up.  Tells me she was laid off of her job at the end of last month.  Tells me she picked up Eliquis from the pharmacy but has not taken it since it was restarted at last office visit.  She confirms to me that the only medication she is taking for her heart failure is Lasix. Denies any chest pain, shortness of breath, palpitations, syncope, presyncope, dizziness, orthopnea, PND, swelling or significant weight changes, acute bleeding, or claudication.  Tells me she has sees intermittent, random episodes of feeling as though "something is not right."  Difficult for her to describe.  Sister, stroke in 2015. Bed ridden after CVA. Father died in 25 - kidney failure, used to smoke. Mother is still living, thinks she has A-fib and says she has leg swelling.   SH: Used to work for [employer] in [place], sports. Daughter is still attending [employer].   ROS: Negative. See HPI.   Studies Reviewed: Marland Kitchen   EKG: EKG is not ordered today.   Echo 04/2023: 1. Left ventricular ejection fraction, by estimation, is 20 to 25%. The  left ventricle has severely decreased function. The left ventricle  demonstrates global hypokinesis. Left ventricular diastolic parameters are indeterminate. Elevated left ventricular end-diastolic pressure.   2. Right ventricular systolic function is normal. The right ventricular  size is normal. There is moderately elevated pulmonary artery systolic  pressure. The estimated right ventricular systolic pressure is 47.9  mmHg.   3. The mitral valve is grossly normal. Mild mitral valve regurgitation.  No evidence of mitral stenosis.   4. The aortic valve has an indeterminant number of cusps. Aortic valve  regurgitation is not visualized. No aortic stenosis is present.   5. The inferior vena cava is normal in size with greater than 50%  respiratory variability,  suggesting right atrial pressure of 3 mmHg.   Comparison(s): Changes from prior study are noted. EF 25-30%. Moderately dilated left ventricle. Mild to moderate tricuspid regurgitation.  Echo 11/2019:  1. EF similar to that seen on echo done May 2020 . Left ventricular  ejection fraction, by estimation, is 25 to 30%. The left ventricle has  severely decreased function. The left ventricle demonstrates global  hypokinesis. The left ventricular internal  cavity size was moderately dilated. There is mild asymmetric left  ventricular hypertrophy of the basal and septal segments. Left ventricular  diastolic parameters were normal.   2. AICD wires in RA/RV. Right ventricular systolic function is normal.  The right ventricular size is normal. There is moderately elevated  pulmonary artery systolic pressure.   3. Left atrial size was mildly dilated.   4. The mitral valve is normal in structure. Mild mitral valve  regurgitation. No evidence of mitral stenosis.   5. Tricuspid valve regurgitation is mild to moderate.   6. The aortic valve is normal in structure. Aortic valve regurgitation is  not visualized. No aortic stenosis is present.   7. The inferior vena cava is normal in size with greater than 50%  respiratory variability, suggesting right atrial pressure of 3 mmHg.   Risk Assessment/Calculations:       The 10-year ASCVD risk score (Arnett DK, et al., 2019) is: 6.3%   Values used to calculate the score:     Age: 23 years     Sex: Female     Is Non-Hispanic African American: No     Diabetic: Yes     Tobacco smoker: No     Systolic Blood Pressure: 128 mmHg     Is BP treated: Yes     HDL Cholesterol: 39 mg/dL     Total Cholesterol: 194 mg/dL      Physical Exam:   VS:  BP 128/72   Pulse 98   Ht 5' 7.5" (1.715 m)   Wt 191 lb 9.6 oz (86.9 kg)   SpO2 97%   BMI 29.57 kg/m    Wt Readings from Last 3 Encounters:  05/26/23 191 lb 9.6 oz (86.9 kg)  05/19/23 196 lb 9.6 oz (89.2 kg)   04/07/23 191 lb (86.6 kg)    GEN: Well nourished, well developed in no acute distress NECK: No JVD; No carotid bruits CARDIAC: S1/S2, RRR, no murmurs, rubs, gallops RESPIRATORY:  Clear to auscultation without rales, wheezing or rhonchi  ABDOMEN: Soft, non-tender, non-distended EXTREMITIES:  No edema; No deformity  PSYCH: Calm, cooperative, flat affect  ASSESSMENT AND PLAN: .    Nonischemic cardiomyopathy, noncompaction cardiomyopathy, chronic systolic CHF, s/p ICD, medication management Stage C, NYHA class I symptoms. EF 20-25% 04/2023. Euvolemic and well compensated on exam.  She is only taking Lasix daily.  Will restart Eliquis 5 mg BID for stroke prevention. Will obtain CBC, Mag in 1 week for medication management. If kidney function allows, will plan to start either ARB/SGLT2i at next office visit.  Will also start metoprolol succinate 25 mg daily.  She will let us know in 1 to 2 weeks how she is  doing.  Low sodium diet, fluid restriction <2L, and daily weights encouraged. Educated to contact our office for weight gain of 2 lbs overnight or 5 lbs in one week. Denies any shocks. Last remote device check revealed normal device function. Continue to follow-up with EP.  Genetic testing has been discussed with patient. She verbalized understanding.  Will provide assistance regarding Eliquis to patient.  Will also CC/loop in LCSW for any patient assistance.  2. CAD Stable with no anginal symptoms. No indication for ischemic evaluation. Not on aspirin, okay to start Eliquis as mentioned above. Continue atorvastatin. Heart healthy diet and regular cardiovascular exercise encouraged. Care and ED precautions discussed.  3. HTN BP stable. Discussed to monitor BP at home at least 2 hours after medications and sitting for 5-10 minutes. Dizziness at work if felt to be due to dehydration. Encouraged adequate water intake. She verbalized understanding. Heart healthy diet and regular cardiovascular exercise  encouraged.  See medication changes noted above.  4. HLD LDL from 09/2022 was 133. I question the compliance with atorvastatin. Continue atorvastatin. Continue to follow-up with Endo and PCP. Heart healthy diet and regular cardiovascular exercise encouraged.   5.  Hypomagnesemia Recent magnesium level was found to be 1.5.  Previously recommended starting magnesium oxide 400 mg daily and rechecking magnesium level 2 weeks.  Patient says she believes she has a magnesium supplement at home and she will call and let us know once this is verified.  Will recheck labs as mentioned above.  Dispo: Follow-up with me/APP in 4-6 weeks or sooner if anything changes.   Signed, Sharlene Dory, NP

## 2023-05-27 ENCOUNTER — Telehealth: Payer: Self-pay | Admitting: *Deleted

## 2023-05-27 NOTE — Telephone Encounter (Signed)
Patient was identified as falling into the [place] Measure - Diabetes.   Patient was: Appointment scheduled for lab or office visit for A1c.  Patient was seen by Endocrinology for HgbA1c and office visit 05/19/2023

## 2023-05-29 NOTE — Progress Notes (Signed)
EPIC Encounter for ICM Monitoring  Patient Name: Tara Bryant is a 53 y.o. female Date: 05/29/2023 Primary Care Physican: Tommie Sams, DO Primary Cardiologist: Wyline Mood Electrophysiologist: Ladona Ridgel 04/16/2022 Weight: 195 lbs 09/16/2022 Office Weight: 193 lbs 05/29/2023 Weight: 191 lbs                              Spoke with patient and heart failure questions reviewed.  Transmission results reviewed.  Pt asymptomatic for fluid accumulation.  Reports feeling well at this time and voices no complaints.   She was laid off in January and currently looking for a job.  She will not have any health insurance until she has a new job and requested to skip March for ICM follow up.    HeartLogic Heart Failure Index 2 suggesting fluid levels within normal threshold.   Suggesting possible fluid accumulation from 1/20-2/3 with high index of 26.   Prescribed: Furosemide 40 mg Take 60 mg by mouth daily. Can take an additional 20 mg for SOB Potassium 20 mEq Take 2 tablets (40 mEq total) by mouth daily. Spironolactone 25 mg take 0.5 tablet (12.5 mg total) by mouth daily.   Labs: 05/16/2023 Creatinine 0.93, BUN 21, Potassium 4.2, Sodium 134, GFR >60, Glucose 437 09/16/2022 Creatinine 0.78, BUN 14, Potassium 3.9, Sodium 135, GFR >60 A complete set of results can be found in Results Review.   Recommendations:  No changes and encouraged to call if experiencing any fluid symptoms.  Advised to be compliant with Lasix and call if she has changes in condition.    Follow-up plan: ICM clinic phone appointment on 08/04/2023 (pt requested to skip March f/u due to no health insurance).   91 day device clinic remote transmission 08/01/2023.              EP/Cardiology next office visit:  07/14/2023 with Sharlene Dory, NP.   Recall 08/09/2023 with Dr Ladona Ridgel.              Copy of ICM check sent to Dr. Ladona Ridgel.  3 Month HeartLogicT Heart Failure Index:    8 Day Data Trend:          Karie Soda, RN 05/29/2023 9:10  AM

## 2023-06-04 ENCOUNTER — Telehealth: Payer: Self-pay | Admitting: Cardiology

## 2023-06-04 NOTE — Telephone Encounter (Signed)
 Pt came in office asking if Tara Bryant had reached out to a Child psychotherapist in regards to getting disability. She starts a new job on Monday and would like an update! Thank you!

## 2023-06-04 NOTE — Telephone Encounter (Deleted)
 Pt came in office asking if Lanora Manis had reached out to a Child psychotherapist in regards to getting disability. She starts a new job on Monday and would like an update! Thank you!

## 2023-06-05 NOTE — Telephone Encounter (Signed)
 MyChart message sent to patient.

## 2023-06-06 ENCOUNTER — Telehealth (HOSPITAL_COMMUNITY): Payer: Self-pay | Admitting: Licensed Clinical Social Worker

## 2023-06-06 NOTE — Telephone Encounter (Signed)
 CSW received referral to contact patient regarding disability information. CSW attempted contact and left message for return call. Lasandra Beech, LCSW, CCSW-MCS [phone]

## 2023-06-11 NOTE — Progress Notes (Signed)
 Remote ICD transmission.

## 2023-06-30 ENCOUNTER — Encounter

## 2023-07-08 ENCOUNTER — Encounter: Payer: Self-pay | Admitting: Physician Assistant

## 2023-07-08 NOTE — Progress Notes (Signed)
 [facility]   Reason for referral: Swollen left  leg  History of Present Illness  Tara Bryant is a 53 y.o. female who presents with chief complaint: swollen leg.  Patient notes, onset of swelling  years ago, associated with prolonged sitting and standing.  The patient has had no history of DVT,  positive history of varicose vein, no history of venous stasis ulcers, no history of  Lymphedema and no history of skin changes in lower legs.  There is unknown family history of venous disorders.  The patient has not used compression stockings in the past.  She does have a history of congestive heart failure that is being managed with diuretics. She has a AICD in place. She has a history of coronary artery disease, status post catheterization in 2019 with 60% mid LAD stenosis that was treated medically. She is a diabetic. She is medically managed for hypertension. She is not a smoker. She is on a statin for hypercholesterolemia.   Past Medical History:  Diagnosis Date   AICD (automatic cardioverter/defibrillator) present    Arrhythmia    Arthritis    CAD (coronary artery disease)    a. 03/2018: cath showing 60% mid-LAD stenosis not significant by FFR.    Chronic combined systolic (congestive) and diastolic (congestive) heart failure (HCC)    a. 03/2018: found to have a newly reduced EF of 20% by echocardiogram   Depression    denies   Diabetes mellitus    GERD (gastroesophageal reflux disease)    h/o of   Hypertension    Microproteinuria    S/P thyroid biopsy 2020   follow up once a year,    Sleep apnea     Past Surgical History:  Procedure Laterality Date   ANTERIOR CERVICAL DECOMP/DISCECTOMY FUSION N/A 12/03/2019   Procedure: ANTERIOR CERVICAL DECOMPRESSION/DISCECTOMY FUSION CERVICAL SIX- CERVICAL SEVEN;  Surgeon: Lisbeth Renshaw, MD;  Location: [facility] OR;  Service: Neurosurgery;  Laterality: N/A;  ANTERIOR CERVICAL DECOMPRESSION/DISCECTOMY FUSION  CERVICAL SIX- CERVICAL SEVEN   BACK SURGERY  2010   BREAST BIOPSY Left 2017   benign   CARDIAC CATHETERIZATION     CORONARY PRESSURE/FFR STUDY N/A 03/24/2018   Procedure: INTRAVASCULAR PRESSURE WIRE/FFR STUDY;  Surgeon: Marykay Lex, MD;  Location: [facility] INVASIVE CV LAB;  Service: Cardiovascular;  Laterality: N/A;   ICD IMPLANT N/A 10/29/2018   Procedure: ICD IMPLANT;  Surgeon: Marinus Maw, MD;  Location: [facility] INVASIVE CV LAB;  Service: Cardiovascular;  Laterality: N/A;   NOSE SURGERY     RIGHT/LEFT HEART CATH AND CORONARY ANGIOGRAPHY N/A 03/24/2018   Procedure: RIGHT/LEFT HEART CATH AND CORONARY ANGIOGRAPHY;  Surgeon: Marykay Lex, MD;  Location: [facility] INVASIVE CV LAB;  Service: Cardiovascular;  Laterality: N/A;   TONSILLECTOMY     TOOTH EXTRACTION     September 2022   WISDOM TOOTH EXTRACTION      Social History   Socioeconomic History   Marital status: Divorced    Spouse name: Not on file   Number of children: 1   Years of education: Not on file   Highest education level: Not on file  Occupational History   Occupation: Home maker  Tobacco Use   Smoking status: Never   Smokeless tobacco: Never  Vaping Use   Vaping status: Never Used  Substance and Sexual Activity   Alcohol use: No   Drug use: No   Sexual activity: Yes    Birth control/protection: Pill  Other Topics Concern  Not on file  Social History Narrative   Not on file   Social Drivers of Health   Financial Resource Strain: Not on file  Food Insecurity: Not on file  Transportation Needs: Not on file  Physical Activity: Not on file  Stress: Not on file  Social Connections: Not on file  Intimate Partner Violence: Not on file    Family History  Problem Relation Age of Onset   Breast cancer Mother    Hypertension Mother    Chronic Renal Failure Father    Stroke Sister 30       smoker   Breast cancer Sister    Diabetes Sister    Hypertension Sister    Heart attack Paternal Grandmother     Diabetes Maternal Aunt    Colon cancer Neg Hx     Current Outpatient Medications on File Prior to Visit  Medication Sig Dispense Refill   furosemide (LASIX) 20 MG tablet Take 1 tablet (20 mg total) by mouth daily. With 40 mg tablet to equal 60 mg daily. Can Do additional 20 mg daily for SOB as needed 135 tablet 1   furosemide (LASIX) 40 MG tablet Take 1 tablet (40 mg total) by mouth daily. With 20 mg tablet to equal 60 mg daily. Can Do additional 20 mg daily for SOB as needed 135 tablet 1   potassium chloride SA (KLOR-CON M) 20 MEQ tablet Take 2 tablets (40 mEq total) by mouth daily. (Patient not taking: Reported on 05/26/2023) 60 tablet 6   sacubitril-valsartan (ENTRESTO) 97-103 MG Take 1 tablet by mouth 2 (two) times daily. (Patient not taking: Reported on 04/07/2023) 60 tablet 6   sertraline (ZOLOFT) 50 MG tablet Take 1 tablet (50 mg total) by mouth daily. (Patient not taking: Reported on 04/07/2023) 90 tablet 1   spironolactone (ALDACTONE) 25 MG tablet Take 12.5 mg by mouth daily. (Patient not taking: Reported on 04/07/2023)     No current facility-administered medications on file prior to visit.    Allergies as of 10/28/2022 - Review Complete 10/28/2022  Allergen Reaction Noted   Aspartame and phenylalanine Dermatitis 07/30/2012     ROS:   General:  No weight loss, Fever, chills  HEENT: No recent headaches, no nasal bleeding, no visual changes, no sore throat  Neurologic: No dizziness, blackouts, seizures. No recent symptoms of stroke or mini- stroke. No recent episodes of slurred speech, or temporary blindness.  Cardiac: No recent episodes of chest pain/pressure, no shortness of breath at rest.  No shortness of breath with exertion.  Denies history of atrial fibrillation or irregular heartbeat  Vascular: No history of rest pain in feet.  No history of claudication.  No history of non-healing ulcer, No history of DVT   Pulmonary: No home oxygen, no productive cough, no  hemoptysis,  No asthma or wheezing  Musculoskeletal:  [ ]  Arthritis, [ ]  Low back pain,  [ ]  Joint pain  Hematologic:No history of hypercoagulable state.  No history of easy bleeding.  No history of anemia  Gastrointestinal: No hematochezia or melena,  No gastroesophageal reflux, no trouble swallowing  Urinary: [ ]  chronic Kidney disease, [ ]  on HD - [ ]  MWF or [ ]  TTHS, [ ]  Burning with urination, [ ]  Frequent urination, [ ]  Difficulty urinating;   Skin: No rashes  Psychological: No history of anxiety,  No history of depression  Physical Examination  Vitals:   10/28/22 1447  BP: 129/82  Pulse: 90  Resp: 16  Temp: 97.8  F (36.6 C)  TempSrc: Temporal  SpO2: 97%  Weight: 193 lb (87.5 kg)  Height: 5\' 8"  (1.727 m)    Body mass index is 29.35 kg/m.  General:  Alert and oriented, no acute distress HEENT: Normal Neck: No bruit or JVD Pulmonary: Clear to auscultation bilaterally Cardiac: Regular Rate and Rhythm without murmur Abdomen: Soft, non-tender, non-distended, no mass, no scars Skin: No rash         Extremity no ischemic changes Musculoskeletal: pitting edema without hyperpigmentation  Neurologic: Upper and lower extremity motor 5/5 and symmetric  DATA: LEFT              Reflux  RefluxReflux Time Diameter  Comments                               No       Yes                 cms                         +------------------+--------+------+-----------+-----------+---------------  ---+  CFV              no                                                       +------------------+--------+------+-----------+-----------+---------------  ---+  FV mid            no                                                       +------------------+--------+------+-----------+-----------+---------------  ---+  Popliteal        no                                                        +------------------+--------+------+-----------+-----------+---------------  ---+  GSV at [facility]                 yes    >500 ms     0.79                         +------------------+--------+------+-----------+-----------+---------------  ---+  GSV prox thigh    no                          0.41                         +------------------+--------+------+-----------+-----------+---------------  ---+  GSV mid thigh              yes    >500 ms     0.37                         +------------------+--------+------+-----------+-----------+---------------  ---+  GSV dist thigh  yes    >500 ms     0.37    out of fascia        +------------------+--------+------+-----------+-----------+---------------  ---+  GSV at knee                yes    >500 ms     0.40                         +------------------+--------+------+-----------+-----------+---------------  ---+  GSV prox calf              yes    >500 ms     0.44                         +------------------+--------+------+-----------+-----------+---------------  ---+  SSV Pop Fossa     no                          0.23                         +------------------+--------+------+-----------+-----------+---------------  ---+  anterior accessoryno                          0.32    proximal                                                                   varicosity           +------------------+--------+------+-----------+-----------+---------------      enous Reflux Times  +--------------+---------+------+----------+------------+------------------  ----+  RIGHT        Reflux NoReflux  Reflux  Diameter cmsComments                                         Yes     Time                                        +--------------+---------+------+----------+------------+------------------  ----+  CFV          no                                                             +--------------+---------+------+----------+------------+------------------  ----+  FV prox                 yes  >1 second                                      +--------------+---------+------+----------+------------+------------------  ----+  FV mid        no  duplicatred vein         +--------------+---------+------+----------+------------+------------------  ----+  FV dist                 yes  >1 second             duplicated vein          +--------------+---------+------+----------+------------+------------------  ----+  Popliteal    no                                                            +--------------+---------+------+----------+------------+------------------  ----+  GSV at [facility]              yes   >500 ms      0.85                             +--------------+---------+------+----------+------------+------------------  ----+  GSV prox thigh          yes   >500 ms      0.49                             +--------------+---------+------+----------+------------+------------------  ----+  GSV mid thigh           yes   >500 ms      0.40    branching                +--------------+---------+------+----------+------------+------------------  ----+  GSV dist thighno                           0.33                             +--------------+---------+------+----------+------------+------------------  ----+  GSV at knee   no                           0.35                             +--------------+---------+------+----------+------------+------------------  ----+  GSV prox calf no                           0.42    branching                +--------------+---------+------+----------+------------+------------------  ----+  SSV Pop Fossa no                           0.28    Distal thigh  extension   +--------------+---------+------+----------+------------+------------------  ----+  SSV prox calf           yes   >500 ms      0.28                             +--------------+---------+------+----------+------------+------------------  ----+    Assessment/Plan: B LE edema with varicose veins. Swelling with aching pain that progresses through her day.   Her vein size is > 0.4 cm  at the [facility] and GSV below.   She may to benefit from intervention at this time.  She was placed and 20-30 bilateral thigh-high compression stockings. Elevation periodically, exercise and water therapy if available.  She will f/u with our vein clinic MD for evaluation and recommendations.     Mosetta Pigeon PA-C [facility] [place] Office: [phone]  MD in clinic [place]

## 2023-07-12 ENCOUNTER — Other Ambulatory Visit: Payer: Self-pay | Admitting: Cardiology

## 2023-07-14 ENCOUNTER — Other Ambulatory Visit: Payer: Self-pay | Admitting: Cardiology

## 2023-07-14 ENCOUNTER — Ambulatory Visit: Payer: 59 | Admitting: Nurse Practitioner

## 2023-07-14 MED ORDER — FUROSEMIDE 40 MG PO TABS: ORAL_TABLET | ORAL | 2 refills | Status: DC

## 2023-07-28 ENCOUNTER — Ambulatory Visit (INDEPENDENT_AMBULATORY_CARE_PROVIDER_SITE_OTHER): Payer: 59 | Admitting: Family Medicine

## 2023-07-28 ENCOUNTER — Encounter: Payer: Self-pay | Admitting: Family Medicine

## 2023-07-28 VITALS — BP 117/69 | HR 98 | Temp 97.9°F | Ht 67.5 in | Wt 199.0 lb

## 2023-07-28 DIAGNOSIS — I5022 Chronic systolic (congestive) heart failure: Secondary | ICD-10-CM

## 2023-07-28 DIAGNOSIS — E1165 Type 2 diabetes mellitus with hyperglycemia: Secondary | ICD-10-CM

## 2023-07-28 DIAGNOSIS — Z23 Encounter for immunization: Secondary | ICD-10-CM

## 2023-07-28 DIAGNOSIS — I10 Essential (primary) hypertension: Secondary | ICD-10-CM | POA: Diagnosis not present

## 2023-07-28 DIAGNOSIS — Z91199 Patient's noncompliance with other medical treatment and regimen due to unspecified reason: Secondary | ICD-10-CM | POA: Diagnosis not present

## 2023-07-28 DIAGNOSIS — Z95810 Presence of automatic (implantable) cardiac defibrillator: Secondary | ICD-10-CM

## 2023-07-28 DIAGNOSIS — Z794 Long term (current) use of insulin: Secondary | ICD-10-CM | POA: Diagnosis not present

## 2023-07-28 NOTE — Progress Notes (Signed)
 Subjective:  [name], female    DOB: Jun 12, 1970  Age: 53 y.o. MRN: [id number]  CC:   Chief Complaint  Patient presents with   Hypertension    HPI:  53 year old female with the below mentioned medical problems presents for follow-up.  Patient's blood pressure is well-controlled.  She is on metoprolol  and Lasix .  She is not on any other medications regarding her hypertension or for CHF.  This is mainly due to cost.  Patient has lost her job.  Currently unemployed.  This seems to be the biggest issue/risk factor.  Compliance is an issue as a result.  Follows with endocrinology.  Diabetes is uncontrolled.  Lipids uncontrolled.  This is due to noncompliance.  Patient amenable to pneumococcal vaccine today.  She states that she wants to wait on her other preventative healthcare items as cost is prohibitive at this time.  Patient denies chest pain or shortness of breath.  Patient Active Problem List   Diagnosis Date Noted   Varicose veins of both lower extremities with pain 09/09/2022   Noncompliance 03/21/2022   CAD (coronary artery disease) 09/19/2021   Anxiety and depression 09/19/2021   OSA (obstructive sleep apnea) 09/30/2019   Chronic systolic heart failure (HCC) 02/04/2019   ICD (implantable cardioverter-defibrillator) in place 02/04/2019   Uncontrolled type 2 diabetes mellitus with hyperglycemia (HCC) 06/10/2018   Mixed hyperlipidemia 06/10/2018   Essential hypertension, benign 07/30/2012    Social Hx   Social History   Socioeconomic History   Marital status: Divorced    Spouse name: Not on file   Number of children: 1   Years of education: Not on file   Highest education level: Not on file  Occupational History   Occupation: Home maker  Tobacco Use   Smoking status: Never   Smokeless tobacco: Never  Vaping Use   Vaping status: Never Used  Substance and Sexual Activity   Alcohol use: No   Drug use: No   Sexual activity: Yes    Birth  control/protection: Pill  Other Topics Concern   Not on file  Social History Narrative   Not on file   Social Drivers of Health   Financial Resource Strain: Not on file  Food Insecurity: Not on file  Transportation Needs: Not on file  Physical Activity: Not on file  Stress: Not on file  Social Connections: Not on file    Review of Systems Per HPI  Objective:  BP 117/69   Pulse 98   Temp 97.9 F (36.6 C)   Ht 5' 7.5" (1.715 m)   Wt 199 lb (90.3 kg)   SpO2 97%   BMI 30.71 kg/m      07/28/2023   10:10 AM 05/26/2023   10:54 AM 05/19/2023    3:58 PM  BP/Weight  Systolic BP 117 128 132  Diastolic BP 69 72 78  Wt. (Lbs) 199 191.6 196.6  BMI 30.71 kg/m2 29.57 kg/m2 29.89 kg/m2    Physical Exam Constitutional:      General: She is not in acute distress.    Appearance: Normal appearance.  HENT:     Head: Normocephalic and atraumatic.  Cardiovascular:     Rate and Rhythm: Normal rate and regular rhythm.  Pulmonary:     Effort: Pulmonary effort is normal.     Breath sounds: Normal breath sounds. No wheezing, rhonchi or rales.  Neurological:     Mental Status: She is alert.  Psychiatric:  Mood and Affect: Mood normal.        Behavior: Behavior normal.     Lab Results  Component Value Date   WBC 6.9 05/16/2023   HGB 13.7 05/16/2023   HCT 40.5 05/16/2023   PLT 205 05/16/2023   GLUCOSE 437 (H) 05/16/2023   CHOL 194 09/16/2022   TRIG 111 09/16/2022   HDL 39 (L) 09/16/2022   LDLCALC 133 (H) 09/16/2022   ALT 30 09/16/2022   AST 21 09/16/2022   NA 134 (L) 05/16/2023   K 4.2 05/16/2023   CL 98 05/16/2023   CREATININE 0.93 05/16/2023   BUN 21 (H) 05/16/2023   CO2 25 05/16/2023   TSH 1.652 10/02/2021   INR 1.1 12/04/2019   HGBA1C 10.7 (A) 05/19/2023   MICROALBUR 13.0 (H) 09/16/2022     Assessment & Plan:  Chronic systolic heart failure (HCC) Assessment & Plan: Patient recently seen by cardiology.  Did not get labs drawn.  She is not taking  spironolactone  or Entresto .  Needs addition of medications for optimal goal-directed medical therapy.  This is limited due to cost and noncompliance.  Orders: -     Ambulatory referral to Social Work  ICD (implantable cardioverter-defibrillator) in place -     Ambulatory referral to Social Work  Immunization due -     Pneumococcal conjugate vaccine 20-valent  Essential hypertension, benign Assessment & Plan: Stable.  Continue Lasix  and metoprolol .   Uncontrolled type 2 diabetes mellitus with hyperglycemia (HCC) Assessment & Plan: Uncontrolled. Continue insulin .  Follow-up with endocrinology.   Noncompliance Assessment & Plan: Referring to social work.     Follow-up:  6 months  Boy Delamater Debrah Fan DO [facility]

## 2023-07-28 NOTE — Assessment & Plan Note (Signed)
Referring to social work

## 2023-07-28 NOTE — Assessment & Plan Note (Signed)
 Patient recently seen by cardiology.  Did not get labs drawn.  She is not taking spironolactone  or Entresto .  Needs addition of medications for optimal goal-directed medical therapy.  This is limited due to cost and noncompliance.

## 2023-07-28 NOTE — Assessment & Plan Note (Addendum)
 Uncontrolled. Continue insulin .  Follow-up with endocrinology.

## 2023-07-28 NOTE — Assessment & Plan Note (Signed)
 Stable.  Continue Lasix and metoprolol.

## 2023-07-28 NOTE — Patient Instructions (Signed)
 Please follow up closely with Endo and Cardiology.  Referral to social work placed.  Follow up in 6 months.

## 2023-07-29 ENCOUNTER — Telehealth: Payer: Self-pay

## 2023-07-29 ENCOUNTER — Other Ambulatory Visit: Payer: Self-pay

## 2023-07-29 NOTE — Progress Notes (Signed)
 Care Guide Pharmacy Note  07/29/2023 Name: Tara Bryant MRN: [id number] DOB: 1970-11-18  Referred By: Cook, Jayce G, DO Reason for referral: Complex Care Management (Outreach to schedule referral )   Tara Bryant is a 53 y.o. year old female who is a primary care patient of Cook, Jayce G, DO.  Tara Bryant was referred to the pharmacist for assistance related to: DMII  Successful contact was made with the patient to discuss pharmacy services including being ready for the pharmacist to call at least 5 minutes before the scheduled appointment time and to have medication bottles and any blood pressure readings ready for review. The patient agreed to meet with the pharmacist via telephone visit on (date/time).07/29/2023   Lenton Rail , RMA     [place]  [facility], [facility] Guide  Direct Dial: [phone]  Website: [email or web address]

## 2023-07-29 NOTE — Progress Notes (Signed)
 Complex Care Management Note  Care Guide Note 07/29/2023 Name: Tara Bryant MRN: [id number] DOB: 29-Oct-1970  Tara Bryant is a 53 y.o. year old female who sees Cook, Jayce G, DO for primary care. I reached out to [name] by phone today to offer complex care management services.  Ms. Guilliams was given information about Complex Care Management services today including:   The Complex Care Management services include support from the care team which includes your Nurse Care Manager, Clinical Social Worker, or Pharmacist.  The Complex Care Management team is here to help remove barriers to the health concerns and goals most important to you. Complex Care Management services are voluntary, and the patient may decline or stop services at any time by request to their care team member.   Complex Care Management Consent Status: Patient agreed to services and verbal consent obtained.   Follow up plan:  Telephone appointment with complex care management team member scheduled for:  08/07/2023  Encounter Outcome:  Patient Scheduled  Lenton Rail , RMA     [place]  [facility], [facility] Guide  Direct Dial: [phone]  Website: [email or web address]

## 2023-07-29 NOTE — Progress Notes (Signed)
 07/29/2023  Patient ID: [name], female   DOB: 12-25-1970, 53 y.o.   MRN: [id number]    07/29/2023 Name: Tara Bryant MRN: [id number] DOB: 01/17/1971  Chief Complaint  Patient presents with   Diabetes   Medication Management    Tara Bryant is a 53 y.o. year old female who presented for a telephone visit.   They were referred to the pharmacist by their PCP for assistance in managing medication access.    Subjective:  Care Team: Primary Care Provider: Cook, Jayce G, DO ; Next Scheduled Visit:   Future Appointments  Date Time Provider Department Center  08/01/2023  7:00 AM [facility] DEVICE REMOTES [facility]  08/04/2023  7:20 AM [facility] DEVICE REMOTES [facility]  08/07/2023 10:00 AM Fletcher Humble, LCSW [facility] None  08/19/2023 11:00 AM Wendel Hals, NP [facility] None  08/21/2023  2:30 PM Rolando Cliche, RPH [facility] None  08/29/2023  3:30 PM Lasalle Pointer, NP [facility]  10/31/2023  7:00 AM [facility] DEVICE REMOTES [facility]  01/27/2024  9:20 AM Cook, Jayce G, DO [facility]    Medication Access/Adherence  Current Pharmacy:  [facility] [phone] - [place], [place] - [address] AT [facility] [address] [place] [address] Phone: [phone] Fax: [phone]   Patient reports affordability concerns with their medications:  Currently generic medications covered through insurance, has discount card for eliquis , concern previously with occupational risks like getting cut on the job so had held off on getting from pharmacy..  Patient reports access/transportation concerns to their pharmacy:  not reviewed  Medication Management:  Current adherence strategy:   Patient reports the following barriers to adherence: cost, uncertainty of being able to pay insurance premium through open exchange.    Objective:  Lab Results  Component Value Date   HGBA1C 10.7 (A) 05/19/2023     Lab Results  Component Value Date   CREATININE 0.93 05/16/2023   BUN 21 (H) 05/16/2023   NA 134 (L) 05/16/2023   K 4.2 05/16/2023   CL 98 05/16/2023   CO2 25 05/16/2023    Lab Results  Component Value Date   CHOL 194 09/16/2022   HDL 39 (L) 09/16/2022   LDLCALC 133 (H) 09/16/2022   TRIG 111 09/16/2022   CHOLHDL 5.0 09/16/2022    Medications Reviewed Today     Reviewed by Rolando Cliche, [facility] (Pharmacist) on 07/29/23 at 1520  Med List Status: <None>   Medication Order Taking? Sig Documenting Provider Last Dose Status Informant  apixaban  (ELIQUIS ) 5 MG TABS tablet [id number]  Take 1 tablet (5 mg total) by mouth 2 (two) times daily. Lasalle Pointer, NP  Active   atorvastatin  (LIPITOR) 10 MG tablet [id number] No Take 1 tablet (10 mg total) by mouth daily. Cook, Jayce G, DO Taking Active   B-D ULTRAFINE III SHORT PEN 31G X 8 MM MISC [id number] No USE TO INJECT INSULIN  TWICE DAILY Wendel Hals, NP Taking Active   Blood Glucose Monitoring Suppl (ACCU-CHEK GUIDE ME) w/Device KIT [id number] No Use to test BG tid. Dx E11.65 Wendel Hals, NP Taking Active   furosemide  (LASIX ) 20 MG tablet [id number]  Take 1 tablet (20 mg total) by mouth daily. With 40 mg tablet to equal 60 mg daily. Can Do additional 20 mg daily for SOB as needed Laurann Pollock, MD  Active   furosemide  (LASIX ) 40 MG tablet [id number]  Take 1 tablet (40 mg total) by mouth daily. With 20 mg tablet to  equal 60 mg daily. Can Do additional 20 mg daily for SOB as needed Branch, Joyceann No, MD  Active   glipiZIDE  (GLUCOTROL  XL) 5 MG 24 hr tablet [id number] No Take 1 tablet (5 mg total) by mouth daily with breakfast. Wendel Hals, NP Taking Active   glucose blood (ACCU-CHEK GUIDE) test strip [id number] No Use to test BG tid. E11.65 Nida, Gebreselassie W, MD Taking Active   insulin  isophane & regular human KwikPen (NOVOLIN  70/30 KWIKPEN) (70-30) 100 UNIT/ML KwikPen [id number] No Inject 75 Units into the skin 2 (two) times  daily before a meal. Wendel Hals, NP Taking Active   Lancets MISC [id number] No For Accu-chek smartview E11.65 Use to test tid. Wendel Hals, NP Taking Active   metFORMIN  (GLUCOPHAGE -XR) 500 MG 24 hr tablet [id number] No Take 2 tablets (1,000 mg total) by mouth daily with breakfast. Wendel Hals, NP Taking Active   metoprolol  succinate (TOPROL -XL) 25 MG 24 hr tablet [id number]  Take 1 tablet (25 mg total) by mouth daily. Lasalle Pointer, NP  Active              Assessment/Plan:   --DM: not controlled. has not had low with BG after being out of work, previously would have to eat crackers on break to prevent lows. Current insurance covers metformin , insulin , testing supplies. Upcoming appt with Hulon Magic 08/19/23. Agreed to check in after this appt to see what DM plan is and how I could better assist. Could help with coordinating GLP1 assistance/discount card, if that seems like a good option then.  --CHF: previously on GDMT including metoprolol , entresto , spironolactone . Has may appt with cardiology as well. Overtime has stopped these prescriptions and anticipates will be going back on. Will coordinate med assistance as applicable through cardiology team --reviewed coverage of current medications, discount cards are available should any newly prescribed commercial-insured medications be started, and if coverage changes or is no longer insured, we can review patient assistance options.   Follow Up Plan: 08/21/23 telephone f/u w/ pharmacist  Rolando Cliche, PharmD, BCGP Clinical Pharmacist  [phone]

## 2023-07-30 ENCOUNTER — Telehealth: Payer: Self-pay

## 2023-08-01 ENCOUNTER — Ambulatory Visit (INDEPENDENT_AMBULATORY_CARE_PROVIDER_SITE_OTHER): Payer: 59

## 2023-08-01 DIAGNOSIS — I428 Other cardiomyopathies: Secondary | ICD-10-CM

## 2023-08-01 LAB — CUP PACEART REMOTE DEVICE CHECK
Battery Remaining Longevity: 126 mo
Battery Remaining Percentage: 90 %
Brady Statistic RV Percent Paced: 0 %
Date Time Interrogation Session: 20250425044100
HighPow Impedance: 83 Ohm
Implantable Lead Connection Status: 753985
Implantable Lead Implant Date: 20200723
Implantable Lead Location: 753860
Implantable Lead Model: 293
Implantable Lead Serial Number: 443823
Implantable Pulse Generator Implant Date: 20200723
Lead Channel Impedance Value: 491 Ohm
Lead Channel Setting Pacing Amplitude: 2.5 V
Lead Channel Setting Pacing Pulse Width: 0.4 ms
Lead Channel Setting Sensing Sensitivity: 0.5 mV
Pulse Gen Serial Number: 256025
Zone Setting Status: 755011

## 2023-08-04 ENCOUNTER — Ambulatory Visit: Payer: 59 | Attending: Internal Medicine

## 2023-08-04 DIAGNOSIS — Z95810 Presence of automatic (implantable) cardiac defibrillator: Secondary | ICD-10-CM | POA: Diagnosis not present

## 2023-08-04 DIAGNOSIS — I5022 Chronic systolic (congestive) heart failure: Secondary | ICD-10-CM | POA: Diagnosis not present

## 2023-08-05 ENCOUNTER — Encounter: Payer: Self-pay | Admitting: Internal Medicine

## 2023-08-05 NOTE — Progress Notes (Signed)
 EPIC Encounter for ICM Monitoring  Patient Name: Tara Bryant is a 53 y.o. female Date: 08/05/2023 Primary Care Physican: Myrna Ast, DO Primary Cardiologist: Amanda Jungling Electrophysiologist: Carolynne Citron 04/16/2022 Weight: 195 lbs 09/16/2022 Office Weight: 193 lbs 05/29/2023 Weight: 191 lbs                              Attempted call to patient and unable to reach.  Left detailed message per DPR regarding transmission.  Transmission results reviewed.    HeartLogic Heart Failure Index 7 suggesting fluid levels within normal threshold.   Suggesting possible fluid accumulation from 3/14-4/5 with high index of 18.   Prescribed: Furosemide  40 mg Take 60 mg by mouth daily. Can take an additional 20 mg for SOB   Labs: 05/16/2023 Creatinine 0.93, BUN 21, Potassium 4.2, Sodium 134, GFR >60, Glucose 437 09/16/2022 Creatinine 0.78, BUN 14, Potassium 3.9, Sodium 135, GFR >60 A complete set of results can be found in Results Review.   Recommendations:  Left voice mail with ICM number and encouraged to call if experiencing any fluid symptoms.   Follow-up plan: ICM clinic phone appointment on 10/20/2023 (every 2-3 months due to no health insurance).   91 day device clinic remote transmission 10/31/2023.              EP/Cardiology next office visit:  08/29/2023 with Lasalle Pointer, NP.   Recall 08/09/2023 with Dr Carolynne Citron.              Copy of ICM check sent to Dr. Carolynne Citron.  3 Month HeartLogicT Heart Failure Index:    8 Day Data Trend:          Almyra Jain, RN 08/05/2023 8:44 AM

## 2023-08-07 ENCOUNTER — Other Ambulatory Visit: Payer: Self-pay | Admitting: Licensed Clinical Social Worker

## 2023-08-07 NOTE — Patient Instructions (Signed)
 Visit Information  Thank you for taking time to visit with me today. Please don't hesitate to contact me if I can be of assistance to you before our next scheduled appointment.  Your next care management appointment is by telephone on 08/21/2023 at 10am  Telephone follow up appointment date/time:  08/21/2023 10am  Please call the care guide team at [phone] if you need to cancel, schedule, or reschedule an appointment.   Please call 911 if you are experiencing a Mental Health or Behavioral Health Crisis or need someone to talk to.  Fletcher Humble MSW, LCSW Licensed Clinical Social Worker  [facility], Population Health Direct Dial: [phone]  Fax: [phone]

## 2023-08-07 NOTE — Patient Outreach (Signed)
 Complex Care Management   Visit Note  08/07/2023  Name:  Tara Bryant MRN: [id number] DOB: 22-Dec-1970  Situation: Referral received for Complex Care Management related to Heart Failure and SDOH Barriers:  Food insecurity Financial Resource Strain I obtained verbal consent from Patient.  Visit completed with Tara Bryant  on the phone  Background:   Past Medical History:  Diagnosis Date   AICD (automatic cardioverter/defibrillator) present    Arrhythmia    Arthritis    CAD (coronary artery disease)    a. 03/2018: cath showing 60% mid-LAD stenosis not significant by FFR.    Chronic combined systolic (congestive) and diastolic (congestive) heart failure (HCC)    a. 03/2018: found to have a newly reduced EF of 20% by echocardiogram   Depression    denies   Diabetes mellitus    GERD (gastroesophageal reflux disease)    h/o of   Hypertension    Microproteinuria    S/P thyroid  biopsy 2020   follow up once a year,    Sleep apnea     Assessment: Patient Reported Symptoms:  Cognitive Cognitive Status: Able to follow simple commands, Alert and oriented to person, place, and time Cognitive/Intellectual Conditions Management [RPT]: Not Assessed      Neurological Neurological Review of Symptoms: No symptoms reported    HEENT HEENT Symptoms Reported: Not assessed      Cardiovascular Cardiovascular Symptoms Reported: Not assessed Cardiovascular Conditions: Heart failure  Respiratory Respiratory Symptoms Reported: No symptoms reported    Endocrine Patient reports the following symptoms related to hypoglycemia or hyperglycemia : Not assessed Is patient diabetic?: Yes    Gastrointestinal Gastrointestinal Symptoms Reported: No symptoms reported      Genitourinary Genitourinary Symptoms Reported: No symptoms reported    Integumentary Integumentary Symptoms Reported: No symptoms reported    Musculoskeletal Musculoskelatal Symptoms Reviewed: Not assessed   Falls in the past  year?: No    Psychosocial       Do you feel physically threatened by others?: No      08/07/2023   10:22 AM  Depression screen PHQ 2/9  Decreased Interest 0  Down, Depressed, Hopeless 1  PHQ - 2 Score 1  Altered sleeping 1  Tired, decreased energy 1  Change in appetite 0  Feeling bad or failure about yourself  0  Trouble concentrating 0  Moving slowly or fidgety/restless 0  Suicidal thoughts 0  PHQ-9 Score 3    There were no vitals filed for this visit.  Medications Reviewed Today     Reviewed by Fletcher Humble, LCSW (Social Worker) on 08/07/23 at 1022  Med List Status: <None>   Medication Order Taking? Sig Documenting Provider Last Dose Status Informant  apixaban  (ELIQUIS ) 5 MG TABS tablet [id number]  Take 1 tablet (5 mg total) by mouth 2 (two) times daily. Lasalle Pointer, NP  Active   atorvastatin  (LIPITOR) 10 MG tablet [id number] No Take 1 tablet (10 mg total) by mouth daily. Cook, Jayce G, DO Taking Active   B-D ULTRAFINE III SHORT PEN 31G X 8 MM MISC [id number] No USE TO INJECT INSULIN  TWICE DAILY Wendel Hals, NP Taking Active   Blood Glucose Monitoring Suppl (ACCU-CHEK GUIDE ME) w/Device KIT [id number] No Use to test BG tid. Dx E11.65 Wendel Hals, NP Taking Active   furosemide  (LASIX ) 20 MG tablet [id number]  Take 1 tablet (20 mg total) by mouth daily. With 40 mg tablet to equal 60 mg daily. Can Do additional 20 mg daily for SOB as  needed Laurann Pollock, MD  Active   furosemide  (LASIX ) 40 MG tablet [id number]  Take 1 tablet (40 mg total) by mouth daily. With 20 mg tablet to equal 60 mg daily. Can Do additional 20 mg daily for SOB as needed Branch, Joyceann No, MD  Active   glipiZIDE  (GLUCOTROL  XL) 5 MG 24 hr tablet [id number] No Take 1 tablet (5 mg total) by mouth daily with breakfast. Wendel Hals, NP Taking Active   glucose blood (ACCU-CHEK GUIDE) test strip [id number] No Use to test BG tid. E11.65 Nida, Gebreselassie W, MD Taking Active   insulin   isophane & regular human KwikPen (NOVOLIN  70/30 KWIKPEN) (70-30) 100 UNIT/ML KwikPen [id number] No Inject 75 Units into the skin 2 (two) times daily before a meal. Wendel Hals, NP Taking Active   Lancets MISC [id number] No For Accu-chek smartview E11.65 Use to test tid. Wendel Hals, NP Taking Active   metFORMIN  (GLUCOPHAGE -XR) 500 MG 24 hr tablet [id number] No Take 2 tablets (1,000 mg total) by mouth daily with breakfast. Wendel Hals, NP Taking Active   metoprolol  succinate (TOPROL -XL) 25 MG 24 hr tablet [id number]  Take 1 tablet (25 mg total) by mouth daily. Lasalle Pointer, NP  Active             Recommendation:   Encourage pt to apply for medicaid and food stamps via online at [email or web address] Advised pt of food pantry distribution at [employer]. Encourage pt to apply for unemp benefits due to her reports of being laid off. Encourage pt to engage in free atty consultation regarding her disability claim.   Follow Up Plan:   Telephone follow up appointment date/time:  5/15//2025 10am   Fletcher Humble MSW, LCSW Licensed Clinical Social Worker  [facility], Population Health Direct Dial: [phone]  Fax: [phone]

## 2023-08-19 ENCOUNTER — Ambulatory Visit: Payer: 59 | Admitting: Nurse Practitioner

## 2023-08-19 DIAGNOSIS — Z7984 Long term (current) use of oral hypoglycemic drugs: Secondary | ICD-10-CM

## 2023-08-19 DIAGNOSIS — E782 Mixed hyperlipidemia: Secondary | ICD-10-CM

## 2023-08-19 DIAGNOSIS — E1165 Type 2 diabetes mellitus with hyperglycemia: Secondary | ICD-10-CM

## 2023-08-19 DIAGNOSIS — Z794 Long term (current) use of insulin: Secondary | ICD-10-CM

## 2023-08-19 DIAGNOSIS — Z91148 Patient's other noncompliance with medication regimen for other reason: Secondary | ICD-10-CM

## 2023-08-19 DIAGNOSIS — E559 Vitamin D deficiency, unspecified: Secondary | ICD-10-CM

## 2023-08-19 DIAGNOSIS — I10 Essential (primary) hypertension: Secondary | ICD-10-CM

## 2023-08-21 ENCOUNTER — Other Ambulatory Visit: Payer: Self-pay

## 2023-08-21 ENCOUNTER — Other Ambulatory Visit: Payer: Self-pay | Admitting: Licensed Clinical Social Worker

## 2023-08-21 NOTE — Progress Notes (Signed)
 08/21/2023  [name], female   DOB: [birth year], 53 y.o.   MRN: [id number]    08/21/2023 Name: Tara Bryant MRN: [id number] DOB: 05/23/1970  Chief Complaint  Patient presents with   Medication Management   Diabetes    Tara Bryant is a 53 y.o. year old femaleemale who presented for a telephone visit.   They were referred to the pharmacist by their PCP for assistance in managing diabetes and medication access.    Subjective:  Care Team: Primary Care Provider: Cook, Jayce G, DO   Future Appointments  Date Time Provider Department Center  08/29/2023  3:30 PM Lasalle Pointer, NP [facility]  09/25/2023  2:00 PM Rolando Cliche, [facility] CHL-POP[facility] None  10/31/2023  7:00 AM CVD HVT DEVICE REMOTES CVD-MAGST H&V  01/27/2024  9:20 AM Cook, Jayce G, DO [facility]M-RFM [facility]    Medication Access/Adherence  Current Pharmacy:  [id number] - [place] - [address] AT [facility] [address] [place] [address] Phone: [phone] Fax: [phone]   Patient reports affordability concerns with their medications: Yes  Patient reports access/transportation concerns to their pharmacy:  Patient reports adherence concerns with their medications:  Yes  - d/t cost and not remembering/wanting to take meds-primarily insulin  2nd dose   Diabetes:  Current medications: metformin  1000mg  daily, glipizide  5mg  daily, novolin  70/30 75 units BID.   Current glucose readings: 200s-300s noted w/ FBGs Using traditional glucose meter; testing daily  Patient denies hypoglycemic s/sx including dizziness, shakiness, sweating. Patient denies hyperglycemic symptoms including polyuria, polydipsia, polyphagia, nocturia, neuropathy, blurred vision.  Current medication access support: Currently none, working with an employment agency hoping to get a position next month or so. Happy to help with any assistance in the future, we are also hoping to have her scheduled with  endocrinology next month given 08/19/23 appt canceled by office d/t lack of clean water/sanitization issues in [place].   Biggest contributor to higher BG today is pt missing the second dose of her novolin  70/30, of which she is prescribed 75 units twice daily. Would previously miss meals while working and had some lows, however since being home this has not been an issue. Plans to set a dinner alarm around 6pm so she can help remember to take evening dose.     Objective:  Lab Results  Component Value Date   HGBA1C 10.7 (A) 05/19/2023    Lab Results  Component Value Date   CREATININE 0.93 05/16/2023   BUN 21 (H) 05/16/2023   NA 134 (L) 05/16/2023   K 4.2 05/16/2023   CL 98 05/16/2023   CO2 25 05/16/2023    Lab Results  Component Value Date   CHOL 194 09/16/2022   HDL 39 (L) 09/16/2022   LDLCALC 133 (H) 09/16/2022   TRIG 111 09/16/2022   CHOLHDL 5.0 09/16/2022    Medications Reviewed Today     Reviewed by Rolando Cliche, [facility] (Pharmacist) on 08/21/23 at 1521  Med List Status: <None>   Medication Order Taking? Sig Documenting Provider Last Dose Status Informant  apixaban  (ELIQUIS ) 5 MG TABS tablet [id number]  Take 1 tablet (5 mg total) by mouth 2 (two) times daily. Lasalle Pointer, NP  Active   atorvastatin  (LIPITOR) 10 MG tablet [id number] No Take 1 tablet (10 mg total) by mouth daily. Cook, Jayce G, DO Taking Active   B-D ULTRAFINE III SHORT PEN 31G X 8 MM MISC [id number] No USE TO INJECT INSULIN  TWICE DAILY Hulon Magic  J, NP Taking Active   Blood Glucose Monitoring Suppl (ACCU-CHEK GUIDE ME) w/Device KIT [id number] No Use to test BG tid. Dx E11.65 Wendel Hals, NP Taking Active   furosemide  (LASIX ) 20 MG tablet [id number]  Take 1 tablet (20 mg total) by mouth daily. With 40 mg tablet to equal 60 mg daily. Can Do additional 20 mg daily for SOB as needed Laurann Pollock, MD  Active   furosemide  (LASIX ) 40 MG tablet [id number]  Take 1 tablet (40 mg total) by mouth daily.  With 20 mg tablet to equal 60 mg daily. Can Do additional 20 mg daily for SOB as needed Branch, Joyceann No, MD  Active   glipiZIDE  (GLUCOTROL  XL) 5 MG 24 hr tablet [id number] No Take 1 tablet (5 mg total) by mouth daily with breakfast. Wendel Hals, NP Taking Active   glucose blood (ACCU-CHEK GUIDE) test strip [id number] No Use to test BG tid. E11.65 Nida, Gebreselassie W, MD Taking Active   insulin  isophane & regular human KwikPen (NOVOLIN  70/30 KWIKPEN) (70-30) 100 UNIT/ML KwikPen [id number] No Inject 75 Units into the skin 2 (two) times daily before a meal. Wendel Hals, NP Taking Active   Lancets MISC [id number] No For Accu-chek smartview E11.65 Use to test tid. Wendel Hals, NP Taking Active   metFORMIN  (GLUCOPHAGE -XR) 500 MG 24 hr tablet [id number] No Take 2 tablets (1,000 mg total) by mouth daily with breakfast. Wendel Hals, NP Taking Active   metoprolol  succinate (TOPROL -XL) 25 MG 24 hr tablet [id number]  Take 1 tablet (25 mg total) by mouth daily. Lasalle Pointer, NP  Active               Assessment/Plan:   Diabetes: - Currently uncontrolled - Reviewed long term cardiovascular and renal outcomes of uncontrolled blood sugar - Reviewed goal A1c, goal fasting, and goal 2 hour post prandial glucose - Recommend to check glucose daily. Can call the office if noting consistent low Bgs. Primary recommendation is for patient to practice consistency with her insulin  twice daily dosing - patient plans to set PM alarm to help with this, also is going to continue to attempt f/u to schedule endo appt. We agreed to touch base in one month to reassess medication needs.   Rolando Cliche, PharmD, BCGP Clinical Pharmacist  [phone]

## 2023-08-21 NOTE — Patient Instructions (Signed)
 Visit Information  Thank you for taking time to visit with me today. Please don't hesitate to contact me if I can be of assistance to you before our next scheduled appointment.  Our next appointment is no further scheduled appointments.   Please call the care guide team at [phone] if you need to cancel or reschedule your appointment.   Following is a copy of your care plan:   Goals Addressed             This Visit's Progress    COMPLETED: VBCI Social Work Care Plan       Problems:   Financial Strain   CSW Clinical Goal(s):   Over the next 3 weeks the Patient will explore community resource options for unmet needs related to Financial Strain .  Interventions:  Social Determinants of Health in Patient with CHF: SDOH assessments completed: Financial Strain  and Food Insecurity  Evaluation of current treatment plan related to unmet needs Food resources: assistance with SNAP application encouraged pt to apply for benefit online at [email or web address]  Patient Goals/Self-Care Activities:  Call Department of Social Services [phone] to apply for medicaid and food stamps . Follow up with [employer]  for community food options. Encouraged pt to apply for [place] Medicaid  Plan:   Follow up with provider re: 08/21/2023 Reviewed goals 08/21/2023        Please call 911 if you are experiencing a Mental Health or Behavioral Health Crisis or need someone to talk to.  Patient verbalizes understanding of instructions and care plan provided today and agrees to view in MyChart. Active MyChart status and patient understanding of how to access instructions and care plan via MyChart confirmed with patient.     Fletcher Humble MSW, LCSW Licensed Clinical Social Worker  [facility], Population Health Direct Dial: [phone]  Fax: [phone]

## 2023-08-21 NOTE — Patient Outreach (Signed)
 Complex Care Management   Visit Note  08/21/2023  Name:  Tara Bryant MRN: [id number] DOB: 03-01-1971  Situation: Referral received for Complex Care Management related to SDOH Barriers:  Financial Resource Strain I obtained verbal consent from Patient.  Visit completed with S Mayfield  on the phone  Background:   Past Medical History:  Diagnosis Date   AICD (automatic cardioverter/defibrillator) present    Arrhythmia    Arthritis    CAD (coronary artery disease)    a. 03/2018: cath showing 60% mid-LAD stenosis not significant by FFR.    Chronic combined systolic (congestive) and diastolic (congestive) heart failure (HCC)    a. 03/2018: found to have a newly reduced EF of 20% by echocardiogram   Depression    denies   Diabetes mellitus    GERD (gastroesophageal reflux disease)    h/o of   Hypertension    Microproteinuria    S/P thyroid  biopsy 2020   follow up once a year,    Sleep apnea     Assessment: Patient Reported Symptoms:  Cognitive Cognitive Status: Able to follow simple commands, Alert and oriented to person, place, and time Cognitive/Intellectual Conditions Management [RPT]: None reported or documented in medical history or problem list      Neurological Neurological Review of Symptoms: No symptoms reported    HEENT HEENT Symptoms Reported: No symptoms reported      Cardiovascular Cardiovascular Symptoms Reported: No symptoms reported Does patient have uncontrolled Hypertension?: Yes Is patient checking Blood Pressure at home?: No Cardiovascular Conditions: Heart failure  Respiratory Respiratory Symptoms Reported: No symptoms reported    Endocrine Patient reports the following symptoms related to hypoglycemia or hyperglycemia : No symptoms reported Is patient diabetic?: Yes Is patient checking blood sugars at home?: No Endocrine Conditions: Diabetes Endocrine Management Strategies: Adequate rest  Gastrointestinal Gastrointestinal Symptoms Reported:  No symptoms reported      Genitourinary Genitourinary Symptoms Reported: No symptoms reported    Integumentary Integumentary Symptoms Reported: No symptoms reported    Musculoskeletal Musculoskelatal Symptoms Reviewed: No symptoms reported   Falls in the past year?: No    Psychosocial Psychosocial Symptoms Reported: Depression - if selected complete PHQ 2-9 Behavioral Health Conditions: Depression Behavioral Management Strategies: Adequate rest, Coping strategies, Counseling Major Change/Loss/Stressor/Fears (CP): School or job Do you feel physically threatened by others?: No      08/21/2023   10:34 AM  Depression screen PHQ 2/9  Decreased Interest 0  Down, Depressed, Hopeless 1  PHQ - 2 Score 1  Altered sleeping 0  Tired, decreased energy 1  Change in appetite 0  Feeling bad or failure about yourself  0  Trouble concentrating 0  Moving slowly or fidgety/restless 0  Suicidal thoughts 0  PHQ-9 Score 2    There were no vitals filed for this visit.  Medications Reviewed Today     Reviewed by Fletcher Humble, LCSW (Social Worker) on 08/21/23 at 1029  Med List Status: <None>   Medication Order Taking? Sig Documenting Provider Last Dose Status Informant  apixaban  (ELIQUIS ) 5 MG TABS tablet [id number]  Take 1 tablet (5 mg total) by mouth 2 (two) times daily. Lasalle Pointer, NP  Active   atorvastatin  (LIPITOR) 10 MG tablet [id number] No Take 1 tablet (10 mg total) by mouth daily. Cook, Jayce G, DO Taking Active   B-D ULTRAFINE III SHORT PEN 31G X 8 MM MISC [id number] No USE TO INJECT INSULIN  TWICE DAILY Wendel Hals, NP Taking Active   Blood Glucose Monitoring  Suppl (ACCU-CHEK GUIDE ME) w/Device KIT [id number] No Use to test BG tid. Dx E11.65 Wendel Hals, NP Taking Active   furosemide  (LASIX ) 20 MG tablet [id number]  Take 1 tablet (20 mg total) by mouth daily. With 40 mg tablet to equal 60 mg daily. Can Do additional 20 mg daily for SOB as needed Laurann Pollock, MD   Active   furosemide  (LASIX ) 40 MG tablet [id number]  Take 1 tablet (40 mg total) by mouth daily. With 20 mg tablet to equal 60 mg daily. Can Do additional 20 mg daily for SOB as needed Branch, Joyceann No, MD  Active   glipiZIDE  (GLUCOTROL  XL) 5 MG 24 hr tablet [id number] No Take 1 tablet (5 mg total) by mouth daily with breakfast. Wendel Hals, NP Taking Active   glucose blood (ACCU-CHEK GUIDE) test strip [id number] No Use to test BG tid. E11.65 Nida, Gebreselassie W, MD Taking Active   insulin  isophane & regular human KwikPen (NOVOLIN  70/30 KWIKPEN) (70-30) 100 UNIT/ML KwikPen [id number] No Inject 75 Units into the skin 2 (two) times daily before a meal. Wendel Hals, NP Taking Active   Lancets MISC [id number] No For Accu-chek smartview E11.65 Use to test tid. Wendel Hals, NP Taking Active   metFORMIN  (GLUCOPHAGE -XR) 500 MG 24 hr tablet [id number] No Take 2 tablets (1,000 mg total) by mouth daily with breakfast. Wendel Hals, NP Taking Active   metoprolol  succinate (TOPROL -XL) 25 MG 24 hr tablet [id number]  Take 1 tablet (25 mg total) by mouth daily. 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   Fletcher Humble MSW, LCSW Licensed Clinical Social Worker  [facility], Population Health Direct Dial: [phone]  Fax: [phone]

## 2023-08-29 ENCOUNTER — Ambulatory Visit: Admitting: Nurse Practitioner

## 2023-09-08 NOTE — Progress Notes (Signed)
 Remote ICD transmission.

## 2023-09-18 ENCOUNTER — Ambulatory Visit: Payer: Self-pay | Admitting: *Deleted

## 2023-09-18 NOTE — Telephone Encounter (Signed)
                   FYI Only or Action Required?: Action required by provider  Patient was last seen in primary care on 07/28/2023 by Cook, Jayce G, DO. Called Nurse Triage reporting Leg Swelling. Symptoms began yesterday. Interventions attempted: Nothing. Symptoms are: unchanged.  Triage Disposition: See Physician Within 24 Hours  Patient/caregiver understands and will follow disposition?: Yes          Copied from CRM [id number]. Topic: Clinical - Red Word Triage >> Sep 18, 2023  9:41 AM Oddis Bench wrote: Red Word that prompted transfer to Nurse Triage: patient is calling about red spots on her leg and she thinks that she has been bitten by something. She has swelling on the bites. Reason for Disposition  [1] MODERATE leg swelling (e.g., swelling extends up to knees) AND [2] new-onset or worsening  Answer Assessment - Initial Assessment Questions 1. ONSET: When did the swelling start? (e.g., minutes, hours, days)     Last night  2. LOCATION: What part of the leg is swollen?  Are both legs swollen or just one leg?     Both legs  3. SEVERITY: How bad is the swelling? (e.g., localized; mild, moderate, severe)   - Localized: Small area of swelling localized to one leg.   - MILD pedal edema: Swelling limited to foot and ankle, pitting edema < 1/4 inch (6 mm) deep, rest and elevation eliminate most or all swelling.   - MODERATE edema: Swelling of lower leg to knee, pitting edema > 1/4 inch (6 mm) deep, rest and elevation only partially reduce swelling.   - SEVERE edema: Swelling extends above knee, facial or hand swelling present.      Up to knees  4. REDNESS: Does the swelling look red or infected?     Na  5. PAIN: Is the swelling painful to touch? If Yes, ask: How painful is it?   (Scale 1-10; mild, moderate or severe)     3/10 6. FEVER: Do you have a fever? If Yes, ask: What is it, how was it measured, and when did it start?      no 7. CAUSE: What do  you think is causing the leg swelling?     New job and standing on feet x 12 hours hx CHF 8. MEDICAL HISTORY: Do you have a history of blood clots (e.g., DVT), cancer, heart failure, kidney disease, or liver failure?     Hx CHF 9. RECURRENT SYMPTOM: Have you had leg swelling before? If Yes, ask: When was the last time? What happened that time?     Yes  10. OTHER SYMPTOMS: Do you have any other symptoms? (e.g., chest pain, difficulty breathing)       No chest pain or difficulty breathing no severe pain. Pain occurs after work and when off of feet 11. PREGNANCY: Is there any chance you are pregnant? When was your last menstrual period?       Na   Appt scheduled with PCP tomorrow. Patient at work today .  Protocols used: Leg Swelling and Edema-A-AH

## 2023-09-18 NOTE — Telephone Encounter (Signed)
 noted

## 2023-09-19 ENCOUNTER — Ambulatory Visit: Admitting: Family Medicine

## 2023-09-19 VITALS — BP 126/80 | Ht 67.5 in | Wt 199.8 lb

## 2023-09-19 DIAGNOSIS — R600 Localized edema: Secondary | ICD-10-CM

## 2023-09-19 MED ORDER — DOXYCYCLINE HYCLATE 100 MG PO TABS: 100.0000 mg | ORAL_TABLET | Freq: Two times a day (BID) | ORAL | 0 refills | Status: DC

## 2023-09-19 NOTE — Patient Instructions (Signed)
 Elevate legs.  Medication as prescribed.  Follow up with cardiology.

## 2023-09-21 DIAGNOSIS — R600 Localized edema: Secondary | ICD-10-CM | POA: Insufficient documentation

## 2023-09-21 NOTE — Progress Notes (Signed)
 Subjective:  [name], female    DOB: May 17, 1970  Age: 53 y.o. MRN: [id number]  CC:   Chief Complaint  Patient presents with   bilateral feet swelling    HPI:  53 year old female with nonobstructive coronary artery disease and chronic systolic heart failure presents with lower extremity edema.  Patient reports that she has had swelling in her lower extremities since Wednesday.  Left greater than right.  She has had some associated redness and warmth.  Patient concerned about infection.  Patient has a new job and is on her feet quite a bit.  Patient needs follow-up with cardiology.  She is not on Entresto .  Needs compliance with goal-directed medical therapy to help improve ejection fraction.  No documented fever.  No other complaints.   Patient Active Problem List   Diagnosis Date Noted   Lower extremity edema 09/21/2023   Varicose veins of both lower extremities with pain 09/09/2022   Noncompliance 03/21/2022   CAD (coronary artery disease) 09/19/2021   Anxiety and depression 09/19/2021   OSA (obstructive sleep apnea) 09/30/2019   Chronic systolic heart failure (HCC) 02/04/2019   ICD (implantable cardioverter-defibrillator) in place 02/04/2019   Uncontrolled type 2 diabetes mellitus with hyperglycemia (HCC) 06/10/2018   Mixed hyperlipidemia 06/10/2018   Essential hypertension, benign 07/30/2012    Social Hx   Social History   Socioeconomic History   Marital status: Divorced    Spouse name: Not on file   Number of children: 1   Years of education: Not on file   Highest education level: Not on file  Occupational History   Occupation: Home maker  Tobacco Use   Smoking status: Never   Smokeless tobacco: Never  Vaping Use   Vaping status: Never Used  Substance and Sexual Activity   Alcohol use: No   Drug use: No   Sexual activity: Yes    Birth control/protection: Pill  Other Topics Concern   Not on file  Social History Narrative   Not on file    Social Drivers of Health   Financial Resource Strain: Not on file  Food Insecurity: Food Insecurity Present (08/21/2023)   Hunger Vital Sign    Worried About Running Out of Food in the Last Year: Sometimes true    Ran Out of Food in the Last Year: Never true  Transportation Needs: No Transportation Needs (08/21/2023)   PRAPARE - Administrator, Civil Service (Medical): No    Lack of Transportation (Non-Medical): No  Physical Activity: Not on file  Stress: Not on file  Social Connections: Not on file    Review of Systems Per HPI  Objective:  BP 126/80   Ht 5' 7.5 (1.715 m)   Wt 199 lb 12.8 oz (90.6 kg)   BMI 30.83 kg/m      09/19/2023   10:35 AM 07/28/2023   10:10 AM 05/26/2023   10:54 AM  BP/Weight  Systolic BP 126 117 128  Diastolic BP 80 69 72  Wt. (Lbs) 199.8 199 191.6  BMI 30.83 kg/m2 30.71 kg/m2 29.57 kg/m2    Physical Exam Vitals and nursing note reviewed.  Constitutional:      Appearance: Normal appearance.  HENT:     Head: Normocephalic and atraumatic.   Cardiovascular:     Rate and Rhythm: Normal rate and regular rhythm.  Pulmonary:     Effort: Pulmonary effort is normal.     Breath sounds: Normal breath sounds. No wheezing or rales.  Musculoskeletal:     Comments: Lower extremities with 1+ edema.  Focal area of erythema noted anteriorly to the left leg.  Warm to touch.   Neurological:     Mental Status: She is alert.     Lab Results  Component Value Date   WBC 6.9 05/16/2023   HGB 13.7 05/16/2023   HCT 40.5 05/16/2023   PLT 205 05/16/2023   GLUCOSE 437 (H) 05/16/2023   CHOL 194 09/16/2022   TRIG 111 09/16/2022   HDL 39 (L) 09/16/2022   LDLCALC 133 (H) 09/16/2022   ALT 30 09/16/2022   AST 21 09/16/2022   NA 134 (L) 05/16/2023   K 4.2 05/16/2023   CL 98 05/16/2023   CREATININE 0.93 05/16/2023   BUN 21 (H) 05/16/2023   CO2 25 05/16/2023   TSH 1.652 10/02/2021   INR 1.1 12/04/2019   HGBA1C 10.7 (A) 05/19/2023    MICROALBUR 13.0 (H) 09/16/2022     Assessment & Plan:  Lower extremity edema Assessment & Plan: Multifactorial.  Certainly congestive heart failure is contributing.  Venous insufficiently contributing as well as patient has significant varicosities.  There is an area of warmth and erythema concerning for cellulitis.  Placing on doxycycline.   Other orders -     Doxycycline Hyclate; Take 1 tablet (100 mg total) by mouth 2 (two) times daily.  Dispense: 20 tablet; Refill: 0  Follow up: If fails to improve or worsens  Carisa Backhaus DO [facility]

## 2023-09-21 NOTE — Assessment & Plan Note (Signed)
 Multifactorial.  Certainly congestive heart failure is contributing.  Venous insufficiently contributing as well as patient has significant varicosities.  There is an area of warmth and erythema concerning for cellulitis.  Placing on doxycycline.

## 2023-09-25 ENCOUNTER — Other Ambulatory Visit: Payer: Self-pay

## 2023-09-25 NOTE — Progress Notes (Signed)
 09/25/2023  [name], female   DOB: 1970/06/24, 53 y.o.   MRN: [id number]    09/25/2023 Name: Tara Bryant MRN: [id number] DOB: 04/26/1970  Chief Complaint  Patient presents with   Medication Management   Diabetes    Tara Bryant is a 53 y.o. year old female who presented for a telephone visit.   They were referred to the pharmacist by their PCP for assistance in managing diabetes and medication access.    Subjective:  Care Team: Primary Care Provider: Cook, Jayce G, DO   Future Appointments  Date Time Provider Department Center  10/23/2023  2:00 PM Tara Bryant, [facility] CHL-POP[facility] None  10/31/2023  7:00 AM CVD HVT DEVICE REMOTES CVD-MAGST H&V  01/27/2024  9:20 AM Cook, Jayce G, DO RFM-RFM RFML    Medication Access/Adherence  Current Pharmacy:  [id number] [place] - [address] AT [facility] [address] [place] [address] Phone: [phone] Fax: [phone]   Patient reports affordability concerns with their medications: Yes  Patient reports access/transportation concerns to their pharmacy:  Patient reports adherence concerns with their medications:  Yes  - d/t cost and not remembering/wanting to take meds-primarily insulin  2nd dose   Diabetes:  Current medications: metformin  1000mg  daily, glipizide  5mg  daily, novolin  70/30 75 units BID.   Current glucose readings: 200s-300s noted w/ FBGs Using traditional glucose meter; testing daily  Patient denies hypoglycemic s/sx including dizziness, shakiness, sweating. Patient denies hyperglycemic symptoms including polyuria, polydipsia, polyphagia, nocturia, neuropathy, blurred vision.  Current medication access support: Currently none, working with an employment agency hoping to get a position next month or so. Happy to help with any assistance in the future, we are also hoping to have her scheduled with endocrinology next month given 08/19/23 appt canceled by office  d/t lack of clean water/sanitization issues in [place].   Biggest contributor to higher BG today is pt missing the second dose of her novolin  70/30, of which she is prescribed 75 units twice daily. Would previously miss meals while working and had some lows, however since being home this has not been an issue. Plans to set a dinner alarm around 6pm so she can help remember to take evening dose.    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 Works 2 days on, 2 days off, then 3 days on.  - needing to pick up pen needles and test strips, aware that they are at the pharmacy, has not been able to pick up yet. Does not have any sugars to provide, and is needing to resume pm insulin  dosing.  -plans to make f/u with cardiology and endocrinology   Objective:  Lab Results  Component Value Date   HGBA1C 10.7 (A) 05/19/2023    Lab Results  Component Value Date   CREATININE 0.93 05/16/2023   BUN 21 (H) 05/16/2023   NA 134 (L) 05/16/2023   K 4.2 05/16/2023   CL 98 05/16/2023   CO2 25 05/16/2023    Lab Results  Component Value Date   CHOL 194 09/16/2022   HDL 39 (L) 09/16/2022   LDLCALC 133 (H) 09/16/2022   TRIG 111 09/16/2022   CHOLHDL 5.0 09/16/2022    Medications Reviewed Today   Medications were not reviewed in this encounter       Assessment/Plan:   Diabetes: - Currently uncontrolled - Reviewed long term cardiovascular and renal outcomes of uncontrolled blood sugar -  Reviewed goal A1c, goal fasting, and goal 2 hour post prandial glucose - Recommend to check glucose daily. Can call the office if noting consistent low Bgs. Primary recommendation is for patient to practice consistency with her insulin  twice daily dosing - patient plans to set PM alarm to help with this, also is going to continue to attempt f/u to schedule endo appt. We agreed to touch base in one month to reassess medication needs.   09/25/2023 update: - no changes  to current medications, needing cardio and endocrinology appts. Plans to schedule soon. 1 month telephone f/u.   Tara Bryant, PharmD, BCGP Clinical Pharmacist  [phone]

## 2023-09-27 ENCOUNTER — Other Ambulatory Visit: Payer: Self-pay | Admitting: Nurse Practitioner

## 2023-09-27 DIAGNOSIS — E1165 Type 2 diabetes mellitus with hyperglycemia: Secondary | ICD-10-CM

## 2023-10-09 ENCOUNTER — Ambulatory Visit: Attending: Internal Medicine | Admitting: Internal Medicine

## 2023-10-09 ENCOUNTER — Encounter: Payer: Self-pay | Admitting: Internal Medicine

## 2023-10-09 VITALS — BP 120/70 | HR 89 | Ht 67.5 in | Wt 188.0 lb

## 2023-10-09 DIAGNOSIS — Z79899 Other long term (current) drug therapy: Secondary | ICD-10-CM

## 2023-10-09 DIAGNOSIS — I5022 Chronic systolic (congestive) heart failure: Secondary | ICD-10-CM

## 2023-10-09 DIAGNOSIS — Z95810 Presence of automatic (implantable) cardiac defibrillator: Secondary | ICD-10-CM

## 2023-10-09 DIAGNOSIS — I428 Other cardiomyopathies: Secondary | ICD-10-CM

## 2023-10-09 MED ORDER — POTASSIUM CHLORIDE ER 10 MEQ PO TBCR: 10.0000 meq | EXTENDED_RELEASE_TABLET | Freq: Every day | ORAL | 3 refills | Status: AC

## 2023-10-09 MED ORDER — FUROSEMIDE 40 MG PO TABS: 40.0000 mg | ORAL_TABLET | Freq: Two times a day (BID) | ORAL | 3 refills | Status: DC

## 2023-10-09 NOTE — Progress Notes (Signed)
 HPI Ms. Cuthrell returns today for followup. She is a pleasant 53 yo woman with a non-ischemic CM, chronic systolic heart failure, HTN, and obesity, s/p ICD insertion. She had gone back to work and lost 20 lbs. But she has gained 8 lbs back. She was out of work for 2 months.  She denies chest pain or syncope or ICD shocks. No edema. She has class 2 CHF symptoms. She admits to being sedentary.  Allergies  Allergen Reactions   Aspartame And Phenylalanine Dermatitis     Current Outpatient Medications  Medication Sig Dispense Refill   apixaban  (ELIQUIS ) 5 MG TABS tablet Take 1 tablet (5 mg total) by mouth 2 (two) times daily.     atorvastatin  (LIPITOR) 10 MG tablet Take 1 tablet (10 mg total) by mouth daily. 90 tablet 3   B-D ULTRAFINE III SHORT PEN 31G X 8 MM MISC USE TO INJECT INSULIN  TWICE DAILY 100 each 3   Blood Glucose Monitoring Suppl (ACCU-CHEK GUIDE ME) w/Device KIT Use to test BG tid. Dx E11.65 1 kit 0   doxycycline  (VIBRA -TABS) 100 MG tablet Take 1 tablet (100 mg total) by mouth 2 (two) times daily. 20 tablet 0   furosemide  (LASIX ) 20 MG tablet Take 1 tablet (20 mg total) by mouth daily. With 40 mg tablet to equal 60 mg daily. Can Do additional 20 mg daily for SOB as needed 45 tablet 2   furosemide  (LASIX ) 40 MG tablet Take 1 tablet (40 mg total) by mouth daily. With 20 mg tablet to equal 60 mg daily. Can Do additional 20 mg daily for SOB as needed 45 tablet 2   glipiZIDE  (GLUCOTROL  XL) 5 MG 24 hr tablet Take 1 tablet (5 mg total) by mouth daily with breakfast. 90 tablet 1   glucose blood (ACCU-CHEK GUIDE) test strip Use to test BG tid. E11.65 100 each 2   insulin  isophane & regular human KwikPen (NOVOLIN  70/30 KWIKPEN) (70-30) 100 UNIT/ML KwikPen INJECT 75 UNITS WITH BREAKFAST AND 75 UNITS WITH SUPPER IF GLUCOSE IS ABOVE 90 AND SHE IS EATING 15 mL 5   Lancets MISC For Accu-chek smartview E11.65 Use to test tid. 300 each 2   metFORMIN  (GLUCOPHAGE -XR) 500 MG 24 hr tablet Take 2  tablets (1,000 mg total) by mouth daily with breakfast. 180 tablet 1   metoprolol  succinate (TOPROL -XL) 25 MG 24 hr tablet Take 1 tablet (25 mg total) by mouth daily. 30 tablet 6   No current facility-administered medications for this visit.     Past Medical History:  Diagnosis Date   AICD (automatic cardioverter/defibrillator) present    Arrhythmia    Arthritis    CAD (coronary artery disease)    a. 03/2018: cath showing 60% mid-LAD stenosis not significant by FFR.    Chronic combined systolic (congestive) and diastolic (congestive) heart failure (HCC)    a. 03/2018: found to have a newly reduced EF of 20% by echocardiogram   Depression    denies   Diabetes mellitus    GERD (gastroesophageal reflux disease)    h/o of   Hypertension    Microproteinuria    S/P thyroid  biopsy 2020   follow up once a year,    Sleep apnea     ROS:   All systems reviewed and negative except as noted in the HPI.   Past Surgical History:  Procedure Laterality Date   ANTERIOR CERVICAL DECOMP/DISCECTOMY FUSION N/A 12/03/2019   Procedure: ANTERIOR CERVICAL DECOMPRESSION/DISCECTOMY FUSION CERVICAL SIX- CERVICAL  SEVEN;  Surgeon: Lanis Pupa, MD;  Location: [facility] OR;  Service: Neurosurgery;  Laterality: N/A;  ANTERIOR CERVICAL DECOMPRESSION/DISCECTOMY FUSION CERVICAL SIX- CERVICAL SEVEN   BACK SURGERY  2010   BREAST BIOPSY Left 2017   benign   CARDIAC CATHETERIZATION     CORONARY PRESSURE/FFR STUDY N/A 03/24/2018   Procedure: INTRAVASCULAR PRESSURE WIRE/FFR STUDY;  Surgeon: Anner Alm ORN, MD;  Location: [facility] INVASIVE CV LAB;  Service: Cardiovascular;  Laterality: N/A;   ICD IMPLANT N/A 10/29/2018   Procedure: ICD IMPLANT;  Surgeon: Waddell Danelle ORN, MD;  Location: [facility] INVASIVE CV LAB;  Service: Cardiovascular;  Laterality: N/A;   NOSE SURGERY     RIGHT/LEFT HEART CATH AND CORONARY ANGIOGRAPHY N/A 03/24/2018   Procedure: RIGHT/LEFT HEART CATH AND CORONARY ANGIOGRAPHY;  Surgeon: Anner Alm ORN,  MD;  Location: [facility] INVASIVE CV LAB;  Service: Cardiovascular;  Laterality: N/A;   TONSILLECTOMY     TOOTH EXTRACTION     September 2022   WISDOM TOOTH EXTRACTION       Family History  Problem Relation Age of Onset   Breast cancer Mother    Hypertension Mother    Chronic Renal Failure Father    Stroke Sister 10       smoker   Breast cancer Sister    Diabetes Sister    Hypertension Sister    Heart attack Paternal Grandmother    Diabetes Maternal Aunt    Colon cancer Neg Hx      Social History   Socioeconomic History   Marital status: Divorced    Spouse name: Not on file   Number of children: 1   Years of education: Not on file   Highest education level: Not on file  Occupational History   Occupation: Home maker  Tobacco Use   Smoking status: Never   Smokeless tobacco: Never  Vaping Use   Vaping status: Never Used  Substance and Sexual Activity   Alcohol use: No   Drug use: No   Sexual activity: Yes    Birth control/protection: Pill  Other Topics Concern   Not on file  Social History Narrative   Not on file   Social Drivers of Health   Financial Resource Strain: Not on file  Food Insecurity: Food Insecurity Present (08/21/2023)   Hunger Vital Sign    Worried About Running Out of Food in the Last Year: Sometimes true    Ran Out of Food in the Last Year: Never true  Transportation Needs: No Transportation Needs (08/21/2023)   PRAPARE - Administrator, Civil Service (Medical): No    Lack of Transportation (Non-Medical): No  Physical Activity: Not on file  Stress: Not on file  Social Connections: Not on file  Intimate Partner Violence: Not At Risk (08/21/2023)   Humiliation, Afraid, Rape, and Kick questionnaire    Fear of Current or Ex-Partner: No    Emotionally Abused: No    Physically Abused: No    Sexually Abused: No     BP 120/70 (BP Location: Right Arm, Patient Position: Sitting, Cuff Size: Large)   Pulse 89   Ht 5' 7.5 (1.715 m)   Wt  188 lb (85.3 kg)   SpO2 96%   BMI 29.01 kg/m   Physical Exam:  Well appearing middle aged woman, NAD HEENT: Unremarkable Neck:  No JVD, no thyromegally Lymphatics:  No adenopathy Back:  No CVA tenderness Lungs:  Clear with minimal basilar rales HEART:  Regular rate rhythm, no murmurs, no rubs,  no clicks Abd:  soft, positive bowel sounds, no organomegally, no rebound, no guarding Ext:  2 plus pulses, no edema, no cyanosis, no clubbing Skin:  No rashes no nodules Neuro:  CN II through XII intact, motor grossly intact  EKG - nsr with PVC's  DEVICE  Normal device function.  See PaceArt for details.   Assess/Plan:   Chronic systolic heart failure - she has class 2 symptoms. I encouraged her to increase her physical activity. Her fluid index is up. I asked her to increase her lasix  to 40 mg bid. She will take 10 meq of potassium with each lasix . Check bmp in a month. 2. ICD - her [employer] single chamber ICD is working normally. We will recheck in several months. 3. HTN - her bp is well controlled. No change in bp meds. 4. Obesity - I encouraged her to lose weight. Since she started back working her weight is down 20 lbs.   Danelle Shacora Zynda,MD

## 2023-10-09 NOTE — Patient Instructions (Addendum)
 Medication Instructions:  Your physician has recommended you make the following change in your medication:  Increase lasix  to 40mg  twice a day. One in the morning and one after lunch.  Start Klor-Con  10 meq once daily.  Lab Work: [facility] - in 2 weeks  You may go to any [facility] Location for your lab work:  [facility] - [address] ([facility]) - [address] [phone] [address]  [place] - [address]   [place]  - [address]   [place] - [address] - [address] [employer]  - [address] [place] [address] ([facility]   If you have labs (blood work) drawn today and your tests are completely normal, you will receive your results only by: [employer] (if you have MyChart)  If you have any lab test that is abnormal or we need to change your treatment, we will call you or send a MyChart message to review the results.  Testing/Procedures: None ordered.  Follow-Up: At [facility], you and your health needs are our priority.  As part of our continuing mission to provide you with exceptional heart care, we have created designated Provider Care Teams.  These Care Teams include your primary Cardiologist (physician) and Advanced Practice Providers (APPs -  Physician Assistants and Nurse Practitioners) who all work together to provide you with the care you need, when you need it.  We recommend signing up for the patient portal called MyChart.  Sign up information is provided on this After Visit Summary.  MyChart is used to connect with patients for Virtual Visits (Telemedicine).  Patients are able to view lab/test results, encounter notes, upcoming appointments, etc.  Non-urgent messages can be sent to your provider as well.   To learn more about what you can do with MyChart, go to [email or web address].    Your next appointment:   1 year(s) in  [place]  The format for your next appointment:   In Person  Provider:   Donnice Primus, MD{or one of the following Advanced Practice Providers on your designated Care Team:   Charlies Arthur, [place] Ozell Jodie Passey, [place] Leotis Barrack, NP  Note: Remote monitoring is used to monitor your Pacemaker/ ICD from home. This monitoring reduces the number of office visits required to check your device to one time per year. It allows us  to keep an eye on the functioning of your device to ensure it is working properly.

## 2023-10-17 ENCOUNTER — Other Ambulatory Visit: Payer: Self-pay | Admitting: Family Medicine

## 2023-10-17 DIAGNOSIS — Z1231 Encounter for screening mammogram for malignant neoplasm of breast: Secondary | ICD-10-CM

## 2023-10-20 ENCOUNTER — Ambulatory Visit: Attending: Internal Medicine

## 2023-10-20 DIAGNOSIS — Z95810 Presence of automatic (implantable) cardiac defibrillator: Secondary | ICD-10-CM | POA: Diagnosis not present

## 2023-10-20 DIAGNOSIS — I5022 Chronic systolic (congestive) heart failure: Secondary | ICD-10-CM | POA: Diagnosis not present

## 2023-10-21 ENCOUNTER — Other Ambulatory Visit: Payer: Self-pay | Admitting: Nurse Practitioner

## 2023-10-22 ENCOUNTER — Telehealth: Payer: Self-pay

## 2023-10-22 NOTE — Progress Notes (Addendum)
 Spoke with patient and heart failure questions reviewed.  Transmission results reviewed.    Pt reports swelling of the feet and shoes are tight.  She thought Dr Waddell told her to increase Lasix  from 60 mg daily to 40 mg twice a day for 5 days only.   Advised Dr Adrian note states to increase Lasix  to 40 mg one in the morning and 1 after lunch daily and added Potassium 10 mEq daily.   Advised there was no mention in the note to increase temporarily for 5 days only.    Advised to take Lasix  40 mg 1 tablet every morning and 1 tablet after lunch every day as well as the Potassium 10 mEq 1 tablet daily.   Advised to continue with the dosage unless Dr Iven office calls her to tell her differently.  She is unable to get her 2 weeks BMET drawn tomorrow due to work and have it drawn next week.  Discuss limiting salt and fluid intake. She does drink more fluid due to she sweats a lot in her current job and advised to continue to drink extra fluid above 64 oz due to sweating.    Will follow up next week.    Copy sent to Dr Alvan as RICK and review plan for patient to take Lasix  as prescribed by Dr Waddell.  Will call her back if any further recommendations from Dr Alvan.

## 2023-10-22 NOTE — Progress Notes (Signed)
 EPIC Encounter for ICM Monitoring  Patient Name: Tara Bryant is a 53 y.o. female Date: 10/22/2023 Primary Care Physican: Cook, Jayce G, DO Primary Cardiologist: Alvan Electrophysiologist: Waddell 04/16/2022 Weight: 195 lbs 09/16/2022 Office Weight: 193 lbs 05/29/2023 Weight: 191 lbs                              Attempted call to patient and unable to reach.  Left detailed message per DPR regarding transmission.  Transmission results reviewed.    HeartLogic Heart Failure Index 21 suggesting possible fluid accumulation starting 6/18 and highest Index was 29 on 7/8.   Prescribed: Furosemide  40 mg Take 1 tablet by mouth twice a day (started on 7/3)  Potassium 10 mEq take by mouth daily   Labs: 10/23/2023 BMET Scheduled by Dr Adrian office 05/16/2023 Creatinine 0.93, BUN 21, Potassium 4.2, Sodium 134, GFR >60, Glucose 437 09/16/2022 Creatinine 0.78, BUN 14, Potassium 3.9, Sodium 135, GFR >60 A complete set of results can be found in Results Review.   Recommendations:  Left voice mail with ICM number and encouraged to call if experiencing any fluid symptoms.   Follow-up plan: ICM clinic phone appointment on 10/28/2023 to recheck fluid levels.   91 day device clinic remote transmission 10/31/2023.              EP/Cardiology next office visit:  11/25/2023 with Almarie Crate, NP.   Recall 02/03/2024 with Dr Waddell.              Copy of ICM check sent to Dr. Waddell.  3 Month HeartLogicT Heart Failure Index:    8 Day Data Trend:          Tara GORMAN Garner, RN 10/22/2023 2:22 PM

## 2023-10-22 NOTE — Telephone Encounter (Signed)
 Remote ICM transmission received.  Attempted call to patient regarding ICM remote transmission and left detailed message per DPR.  Left ICM phone number and advised to return call.

## 2023-10-23 ENCOUNTER — Other Ambulatory Visit: Payer: Self-pay

## 2023-10-24 NOTE — Progress Notes (Signed)
 Attempted call to patient and unable to reach.  Left detailed message per [facility] stating Dr Alvan reviewed report and agreed with Dr Waddell to take Lasix  40 mg bid.

## 2023-10-24 NOTE — Progress Notes (Signed)
  Received: Today Branch, Dorn FALCON, MD  Mychael Soots, Mitzie RAMAN, RN Agree with taking the lasix  as 40mg  bid as Dr Waddell recommended

## 2023-10-27 ENCOUNTER — Other Ambulatory Visit: Payer: Self-pay

## 2023-10-27 ENCOUNTER — Telehealth: Payer: Self-pay

## 2023-10-27 NOTE — Progress Notes (Signed)
   10/27/2023  [name], female   DOB: [birth year], 53 y.o.   MRN: [id number]  Was going to ask for assistance scheduling after attempting; patient quickly called back and completed visit.   Lang Sieve, PharmD, BCGP Clinical Pharmacist  [phone]

## 2023-10-27 NOTE — Progress Notes (Signed)
 10/27/2023  [name], female   DOB: 1970/11/17, 53 y.o.   MRN: [id number]    10/27/2023 Name: Tara Bryant MRN: [id number] DOB: 28-Jan-1971  Chief Complaint  Patient presents with   Medication Management    Tara Bryant is a 53 y.o. year old female who presented for a telephone visit.   They were referred to the pharmacist by their PCP for assistance in managing diabetes and medication access.    Subjective:  Care Team: Primary Care Provider: Cook, Tara G, DO   Future Appointments  Date Time Provider Department Center  10/28/2023  7:10 AM CVD HVT DEVICE REMOTES CVD-MAGST H&V  10/31/2023  7:00 AM CVD HVT DEVICE REMOTES CVD-MAGST H&V  11/05/2023  1:20 PM GI-BCG MM 3 GI-BCGMM GI-BREAST CE  11/25/2023  3:30 PM Tara Norris, NP [facility]  01/27/2024  9:20 AM Bryant, Tara G, DO RFM-RFM RFML    Medication Access/Adherence  [phone] [place] [place] - [address] AT [facility] [address] [place] [address] Phone: [phone] Fax: [phone]   Patient reports affordability concerns with their medications: Yes  Patient reports access/transportation concerns to their pharmacy:  Patient reports adherence concerns with their medications:  Yes  - d/t cost and not remembering/wanting to take meds-primarily insulin  2nd dose   Diabetes:  Current medications: metformin  1000mg  daily, glipizide  5mg  daily, novolin  70/30 75 units BID.   Current glucose readings: 200s-300s noted w/ FBGs Using traditional glucose meter; testing daily  Patient denies hypoglycemic s/sx including dizziness, shakiness, sweating. Patient denies hyperglycemic symptoms including polyuria, polydipsia, polyphagia, nocturia, neuropathy, blurred vision.  Current medication access support: Currently none, working with an employment agency hoping to get a position next month or so. Happy to help with any assistance in the  future, we are also hoping to have her scheduled with endocrinology next month given 08/19/23 appt canceled by office d/t lack of clean water/sanitization issues in [place].   Biggest contributor to higher BG today is pt missing the second dose of her novolin  70/30, of which she is prescribed 75 units twice daily. Would previously miss meals while working and had some lows, however since being home this has not been an issue. Plans to set a dinner alarm around 6pm so she can help remember to take evening dose.    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 Works 2 days on, 2 days off, then 3 days on.  - needing to pick up pen needles and test strips, aware that they are at the pharmacy, has not been able to pick up yet. Does not have any sugars to provide, and is needing to resume pm insulin  dosing.  -plans to make f/u with cardiology and endocrinology   Objective:  Lab Results  Component Value Date   HGBA1C 10.7 (A) 05/19/2023    Lab Results  Component Value Date   CREATININE 0.93 05/16/2023   BUN 21 (H) 05/16/2023   NA 134 (L) 05/16/2023   K 4.2 05/16/2023   CL 98 05/16/2023   CO2 25 05/16/2023    Lab Results  Component Value Date   CHOL 194 09/16/2022   HDL 39 (L) 09/16/2022   LDLCALC 133 (H) 09/16/2022   TRIG 111 09/16/2022   CHOLHDL 5.0 09/16/2022    Medications Reviewed Today   Medications were not reviewed in this encounter       Assessment/Plan:  Diabetes: - Currently uncontrolled - Reviewed long term cardiovascular and renal outcomes of uncontrolled blood sugar - Reviewed goal A1c, goal fasting, and goal 2 hour post prandial glucose - Recommend to check glucose daily. Can call the office if noting consistent low Bgs. Primary recommendation is for patient to practice consistency with her insulin  twice daily dosing - patient plans to set PM alarm to help with this, also is going to continue to attempt f/u to  schedule endo appt. We agreed to touch base in one month to reassess medication needs.   09/25/2023 update: - no changes to current medications, needing cardio and endocrinology appts. Plans to schedule soon. 1 month telephone f/u.   10/27/2023 update: - reviewed DM management with patient. Needing appt with endocrinology, but plans to call today 7/21 or 7/22 for scheduling, hopefully next month.  - is using accucheck guide me meter and plans to get strips for that today or tomorrow as well; no recent readings reported by patient - reviewed insulin  schedule - is struggling with BID dosing of novolin  70/30 75 units. Will reconsider utilizing alarm.  - now has work schedule calendar so believes she will be able to coordinate upcoming appointments.  - states she will call me back today or tomorrow to update me on on teststrips and her endocrinology follow-up. Direct line provided to patient.  Tara Bryant, PharmD, BCGP Clinical Pharmacist  [phone]   Tara Bryant, PharmD, [facility] Clinical Pharmacist  [phone]

## 2023-10-28 ENCOUNTER — Ambulatory Visit: Attending: Internal Medicine

## 2023-10-28 ENCOUNTER — Other Ambulatory Visit (HOSPITAL_COMMUNITY)
Admission: RE | Admit: 2023-10-28 | Discharge: 2023-10-28 | Disposition: A | Discharge status: Home / Self Care | Source: Ambulatory Visit | Attending: Internal Medicine | Admitting: Internal Medicine

## 2023-10-28 DIAGNOSIS — Z79899 Other long term (current) drug therapy: Secondary | ICD-10-CM | POA: Diagnosis not present

## 2023-10-28 DIAGNOSIS — Z95810 Presence of automatic (implantable) cardiac defibrillator: Secondary | ICD-10-CM | POA: Diagnosis not present

## 2023-10-28 DIAGNOSIS — I428 Other cardiomyopathies: Secondary | ICD-10-CM | POA: Insufficient documentation

## 2023-10-28 DIAGNOSIS — I5022 Chronic systolic (congestive) heart failure: Secondary | ICD-10-CM | POA: Insufficient documentation

## 2023-10-28 LAB — BASIC METABOLIC PANEL WITH GFR
Anion gap: 10 (ref 5–15)
BUN: 20 mg/dL (ref 6–20)
CO2: 23 mmol/L (ref 22–32)
Calcium: 8.3 mg/dL — ABNORMAL LOW (ref 8.9–10.3)
Chloride: 101 mmol/L (ref 98–111)
Creatinine, Ser: 0.99 mg/dL (ref 0.44–1.00)
GFR, Estimated: >60 mL/min (ref >60)
Glucose, Bld: 334 mg/dL — ABNORMAL HIGH (ref 70–99)
Potassium: 3.6 mmol/L (ref 3.5–5.1)
Sodium: 134 mmol/L — ABNORMAL LOW (ref 135–145)

## 2023-10-28 NOTE — Progress Notes (Signed)
 EPIC Encounter for ICM Monitoring  Patient Name: Tara Bryant is a 53 y.o. female Date: 10/28/2023 Primary Care Physican: Cook, Jayce G, DO Primary Cardiologist: Alvan Electrophysiologist: Waddell 04/16/2022 Weight: 195 lbs 09/16/2022 Office Weight: 193 lbs 05/29/2023 Weight: 191 lbs 10/09/2023 Office Weight: 188 lbs 10/28/2023 Weight: Not weighing at home                              Spoke with patient and heart failure questions reviewed.  Transmission results reviewed.  Pt reports swelling in feet.  She does not weigh at home.  She works long hours 10-12 and standing most of the day.  Unable to wear compression stockings due to heat in the area she works.      HeartLogic Heart Failure Index 12 suggesting fluid levels are returning to normal threshold.  Thoracic impedance trending up.   Prescribed: Furosemide  40 mg Take 1 tablet by mouth twice a day (started on 7/3)  Potassium 10 mEq take by mouth daily   Labs: 10/28/2023 Drawn todday, waiting for BMET Results 05/16/2023 Creatinine 0.93, BUN 21, Potassium 4.2, Sodium 134, GFR >60, Glucose 437 09/16/2022 Creatinine 0.78, BUN 14, Potassium 3.9, Sodium 135, GFR >60 A complete set of results can be found in Results Review.   Recommendations:   Advised to continue to take Furosemide  40 mg bid as prescribed and limit salt intake.    Follow-up plan: ICM clinic phone appointment on 11/24/2023.   91 day device clinic remote transmission 10/31/2023.              EP/Cardiology next office visit:  11/25/2023 with Almarie Crate, NP.   Recall 02/03/2024 with Dr Waddell.              Copy of ICM check sent to Dr. Waddell.  3 Month HeartLogicT Heart Failure Index:    8 Day Data Trend:          Tara GORMAN Garner, RN 10/28/2023 9:00 AM

## 2023-10-29 ENCOUNTER — Ambulatory Visit: Payer: Self-pay | Admitting: Internal Medicine

## 2023-10-31 ENCOUNTER — Ambulatory Visit (INDEPENDENT_AMBULATORY_CARE_PROVIDER_SITE_OTHER): Payer: 59

## 2023-10-31 DIAGNOSIS — I428 Other cardiomyopathies: Secondary | ICD-10-CM

## 2023-10-31 LAB — CUP PACEART REMOTE DEVICE CHECK
Battery Remaining Longevity: 120 mo
Battery Remaining Percentage: 84 %
Brady Statistic RV Percent Paced: 0 %
Date Time Interrogation Session: 20250725044100
HighPow Impedance: 82 Ohm
Implantable Lead Connection Status: 753985
Implantable Lead Implant Date: 20200723
Implantable Lead Location: 753860
Implantable Lead Model: 293
Implantable Lead Serial Number: 443823
Implantable Pulse Generator Implant Date: 20200723
Lead Channel Impedance Value: 478 Ohm
Lead Channel Setting Pacing Amplitude: 2.5 V
Lead Channel Setting Pacing Pulse Width: 0.4 ms
Lead Channel Setting Sensing Sensitivity: 0.5 mV
Pulse Gen Serial Number: 256025
Zone Setting Status: 755011

## 2023-11-03 ENCOUNTER — Ambulatory Visit: Payer: Self-pay | Admitting: Internal Medicine

## 2023-11-05 ENCOUNTER — Ambulatory Visit
Admission: RE | Admit: 2023-11-05 | Discharge: 2023-11-05 | Disposition: A | Discharge status: Home / Self Care | Source: Ambulatory Visit | Attending: Family Medicine | Admitting: Family Medicine

## 2023-11-05 DIAGNOSIS — Z1331 Encounter for screening for depression: Secondary | ICD-10-CM | POA: Diagnosis not present

## 2023-11-05 DIAGNOSIS — B3731 Acute candidiasis of vulva and vagina: Secondary | ICD-10-CM | POA: Diagnosis not present

## 2023-11-05 DIAGNOSIS — Z01411 Encounter for gynecological examination (general) (routine) with abnormal findings: Secondary | ICD-10-CM | POA: Diagnosis not present

## 2023-11-05 DIAGNOSIS — Z803 Family history of malignant neoplasm of breast: Secondary | ICD-10-CM | POA: Diagnosis not present

## 2023-11-05 DIAGNOSIS — Z01419 Encounter for gynecological examination (general) (routine) without abnormal findings: Secondary | ICD-10-CM | POA: Diagnosis not present

## 2023-11-05 DIAGNOSIS — Z113 Encounter for screening for infections with a predominantly sexual mode of transmission: Secondary | ICD-10-CM | POA: Diagnosis not present

## 2023-11-05 DIAGNOSIS — Z1231 Encounter for screening mammogram for malignant neoplasm of breast: Secondary | ICD-10-CM

## 2023-11-05 DIAGNOSIS — Z124 Encounter for screening for malignant neoplasm of cervix: Secondary | ICD-10-CM | POA: Diagnosis not present

## 2023-11-19 ENCOUNTER — Other Ambulatory Visit: Payer: Self-pay | Admitting: Nurse Practitioner

## 2023-11-23 ENCOUNTER — Other Ambulatory Visit: Payer: Self-pay | Admitting: Cardiology

## 2023-11-24 ENCOUNTER — Ambulatory Visit: Attending: Internal Medicine

## 2023-11-24 DIAGNOSIS — I5022 Chronic systolic (congestive) heart failure: Secondary | ICD-10-CM

## 2023-11-24 DIAGNOSIS — Z95810 Presence of automatic (implantable) cardiac defibrillator: Secondary | ICD-10-CM

## 2023-11-24 NOTE — Progress Notes (Signed)
 EPIC Encounter for ICM Monitoring  Patient Name: Tara Bryant is a 53 y.o. female Date: 11/24/2023 Primary Care Physican: Bluford Jacqulyn MATSU, DO Primary Cardiologist: Alvan Electrophysiologist: Waddell 04/16/2022 Weight: 195 lbs 09/16/2022 Office Weight: 193 lbs 05/29/2023 Weight: 191 lbs 10/09/2023 Office Weight: 188 lbs 10/28/2023 Weight: Not weighing at home                              Spoke with patient and heart failure questions reviewed.  Transmission results reviewed.  Pt reports she is feeling well.  Difficult for her to remember the afternoon dosage of Furosemide .  Explained the lasix  twice a day appears to help maintain normal fluid levels.   HeartLogic Heart Failure Index 0 suggesting normal fluid levels since 8/12.  Was suggesting possible fluid accumulation from 8/8-8/11.   Prescribed: Furosemide  40 mg Take 1 tablet by mouth twice a day (started on 7/3)  Potassium 10 mEq take by mouth daily   Labs: 10/28/2023 Creatinine 0.99, BUN 20, Potassium 3.6, Sodium 134, GFR >60 05/16/2023 Creatinine 0.93, BUN 21, Potassium 4.2, Sodium 134, GFR >60, Glucose 437 09/16/2022 Creatinine 0.78, BUN 14, Potassium 3.9, Sodium 135, GFR >60 A complete set of results can be found in Results Review.   Recommendations:   No changes and encouraged to call if experiencing any fluid symptoms.  Copy sent to Almarie Crate, NP for review at 8/19 OV.     Follow-up plan: ICM clinic phone appointment on 12/29/2023.   91 day device clinic remote transmission 01/30/2024.              EP/Cardiology next office visit:  11/25/2023 with Almarie Crate, NP.   Recall 02/03/2024 with Dr Waddell.              Copy of ICM check sent to Dr. Waddell.  3 Month HeartLogicT Heart Failure Index:    8 Day Data Trend:          [name], RN 11/24/2023 1:19 PM

## 2023-11-25 ENCOUNTER — Ambulatory Visit: Attending: Nurse Practitioner | Admitting: Nurse Practitioner

## 2023-11-25 ENCOUNTER — Encounter: Payer: Self-pay | Admitting: Nurse Practitioner

## 2023-11-25 VITALS — BP 126/66 | HR 87 | Ht 67.5 in | Wt 193.2 lb

## 2023-11-25 DIAGNOSIS — I2510 Atherosclerotic heart disease of native coronary artery without angina pectoris: Secondary | ICD-10-CM

## 2023-11-25 DIAGNOSIS — I428 Other cardiomyopathies: Secondary | ICD-10-CM

## 2023-11-25 DIAGNOSIS — Z79899 Other long term (current) drug therapy: Secondary | ICD-10-CM

## 2023-11-25 DIAGNOSIS — I10 Essential (primary) hypertension: Secondary | ICD-10-CM | POA: Diagnosis not present

## 2023-11-25 DIAGNOSIS — I5022 Chronic systolic (congestive) heart failure: Secondary | ICD-10-CM | POA: Diagnosis not present

## 2023-11-25 DIAGNOSIS — Z95810 Presence of automatic (implantable) cardiac defibrillator: Secondary | ICD-10-CM

## 2023-11-25 DIAGNOSIS — E785 Hyperlipidemia, unspecified: Secondary | ICD-10-CM | POA: Diagnosis not present

## 2023-11-25 NOTE — Patient Instructions (Signed)
 Medication Instructions:  Your physician has recommended you make the following change in your medication:   Restart Eliquis  5 mg two times daily  Restart Lasix  40 mg two times daily   *If you need a refill on your cardiac medications before your next appointment, please call your pharmacy*  Lab Work: Your physician recommends that you return for lab work in: 1 Week ( CBC)   If you have labs (blood work) drawn today and your tests are completely normal, you will receive your results only by: MyChart Message (if you have MyChart) OR A paper copy in the mail If you have any lab test that is abnormal or we need to change your treatment, we will call you to review the results.  Testing/Procedures:   Follow-Up: At [facility], you and your health needs are our priority.  As part of our continuing mission to provide you with exceptional heart care, our providers are all part of one team.  This team includes your primary Cardiologist (physician) and Advanced Practice Providers or APPs (Physician Assistants and Nurse Practitioners) who all work together to provide you with the care you need, when you need it.  Your next appointment:   6 week(s)  Provider:   Almarie Crate, NP    We recommend signing up for the patient portal called MyChart.  Sign up information is provided on this After Visit Summary.  MyChart is used to connect with patients for Virtual Visits (Telemedicine).  Patients are able to view lab/test results, encounter notes, upcoming appointments, etc.  Non-urgent messages can be sent to your provider as well.   To learn more about what you can do with MyChart, go to [email or web address].   Other Instructions Thank you for choosing [facility]!

## 2023-11-25 NOTE — Progress Notes (Signed)
 Cardiology Office Note:  .   Date: 11/25/2023 ID:  Tara Bryant, DOB 05-13-1970, MRN [id number] PCP: Bluford Jacqulyn MATSU DO  [facility] Providers Cardiologist:  Alvan Carrier, MD Advanced Heart Failure:  Toribio Fuel, MD    History of Present Illness: .   Tara Bryant is a 53 y.o. female with a PMH of CAD, nonischemic cardiomyopathy, noncompaction cardiomyopathy, chronic systolic CHF, s/p ICD, hypertension, hyperlipidemia, obesity, type 2 diabetes, sleep apnea, pulmonary hypertension, and medication noncompliance, who presents today for regular follow-up.  She is followed by Dr. Waddell for her ICD.  Patient is on anticoagulation in the setting of noncompaction and low EF history.  Last seen by Dr. Alvan on November 30, 2021.  At the time, patient had stopped her home medications and was overwhelmed with stress related to separation and moving out of her house at the time.  Dr. Alvan stated would restart and titrate medications over time.  Toprol  was increased to 100 mg daily, Entresto  to 97/103 mg twice daily, patient remained hesitant at the time to start Farxiga  due to yeast infections at times although past report from her OB/GYN stated was okay to try.  Nursing visit was arranged after office visit and if vitals look good, was recommended to increase Toprol  250 mg daily with close follow-up of labs.  04/07/2023 - Today she presents for regular follow-up.  She states she is not taking Toprol  XL, potassium supplement, Entresto , Zoloft  or Aldactone , nor is she taking Eliquis . Says she stopped these on her own. Says she has seen a therapist in the past. Denies any chest pain, shortness of breath, palpitations, syncope, presyncope, orthopnea, PND, swelling or significant weight changes, acute bleeding, or claudication. Admits to a feeling of dizziness when standing in the same position for too long. 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   May 26, 2023- Presents today for follow-up.  Tells me she was laid off of her job at the end of last month.  Tells me she picked up Eliquis  from the pharmacy but has not taken it since it was restarted at last office visit.  She confirms to me that the only medication she is taking for her heart failure is Lasix . Denies any chest pain, shortness of breath, palpitations, syncope, presyncope, dizziness, orthopnea, PND, swelling or significant weight changes, acute bleeding, or claudication.  Tells me she has sees intermittent, random episodes of feeling as though something is not right.  Difficult for her to describe.   11/25/2023 -  Here for follow-up. Now has a new job. Not taking Eliquis  per her report. Denies any chest pain, shortness of breath, palpitations, syncope, presyncope, dizziness, orthopnea, PND, swelling or significant weight changes, acute bleeding, or claudication. She admits to weight gain and she is also not taking Lasix .   Sister, stroke in 2015. Bed ridden after CVA. Father died in 69 - kidney failure, used to smoke. Mother is still living, thinks she has A-fib and says she has leg swelling.   SH: Used to work for [employer] in [place], sports. Daughter is still attending [employer].   ROS: Negative. See HPI.   Studies Reviewed: SABRA   EKG: EKG is not ordered today.   Echo 04/2023: 1. Left ventricular ejection fraction, by estimation, is 20 to 25%. The  left ventricle has severely decreased  function. The left ventricle  demonstrates global hypokinesis. Left ventricular diastolic parameters are indeterminate. Elevated left ventricular end-diastolic pressure.   2. Right ventricular systolic function is normal. The right ventricular  size is normal. There is moderately elevated pulmonary artery systolic  pressure. The estimated right ventricular systolic pressure is 47.9 mmHg.   3. The mitral  valve is grossly normal. Mild mitral valve regurgitation.  No evidence of mitral stenosis.   4. The aortic valve has an indeterminant number of cusps. Aortic valve  regurgitation is not visualized. No aortic stenosis is present.   5. The inferior vena cava is normal in size with greater than 50%  respiratory variability, suggesting right atrial pressure of 3 mmHg.   Comparison(s): Changes from prior study are noted. EF 25-30%. Moderately dilated left ventricle. Mild to moderate tricuspid regurgitation.  Echo 11/2019:  1. EF similar to that seen on echo done May 2020 . Left ventricular  ejection fraction, by estimation, is 25 to 30%. The left ventricle has  severely decreased function. The left ventricle demonstrates global  hypokinesis. The left ventricular internal  cavity size was moderately dilated. There is mild asymmetric left  ventricular hypertrophy of the basal and septal segments. Left ventricular  diastolic parameters were normal.   2. AICD wires in RA/RV. Right ventricular systolic function is normal.  The right ventricular size is normal. There is moderately elevated  pulmonary artery systolic pressure.   3. Left atrial size was mildly dilated.   4. The mitral valve is normal in structure. Mild mitral valve  regurgitation. No evidence of mitral stenosis.   5. Tricuspid valve regurgitation is mild to moderate.   6. The aortic valve is normal in structure. Aortic valve regurgitation is  not visualized. No aortic stenosis is present.   7. The inferior vena cava is normal in size with greater than 50%  respiratory variability, suggesting right atrial pressure of 3 mmHg.   Risk Assessment/Calculations:     The 10-year ASCVD risk score (Arnett DK, et al., 2019) is: 6.1%   Values used to calculate the score:     Age: 52 years     Clincally relevant sex: Female     Is Non-Hispanic African American: No     Diabetic: Yes     Tobacco smoker: No     Systolic Blood Pressure: 126  mmHg     Is BP treated: Yes     HDL Cholesterol: 39 mg/dL     Total Cholesterol: 194 mg/dL      Physical Exam:   VS:  BP 126/66 (BP Location: Left Arm, Cuff Size: Normal)   Pulse 87   Ht 5' 7.5 (1.715 m)   Wt 193 lb 3.2 oz (87.6 kg)   SpO2 98%   BMI 29.81 kg/m    Wt Readings from Last 3 Encounters:  11/25/23 193 lb 3.2 oz (87.6 kg)  10/09/23 188 lb (85.3 kg)  09/19/23 199 lb 12.8 oz (90.6 kg)    GEN: Well nourished, well developed in no acute distress NECK: No JVD; No carotid bruits CARDIAC: S1/S2, RRR, no murmurs, rubs, gallops RESPIRATORY:  Clear to auscultation without rales, wheezing or rhonchi  ABDOMEN: Soft, non-tender, non-distended EXTREMITIES:  No edema; No deformity  PSYCH: Calm, cooperative, flat affect  ASSESSMENT AND PLAN: .    Nonischemic cardiomyopathy, noncompaction cardiomyopathy, chronic systolic CHF, s/p ICD, medication management Stage C, NYHA class I symptoms. EF 20-25% 04/2023. She admits to weight gain.  Had a thorough, in depth  conversation with pt about stroke risk d/t her noncompaction cardiomyopathy. Will restart Eliquis  5 mg BID for stroke prevention. Will obtain CBC in 1 week for medication management. If kidney function allows, will plan to start either ARB/SGLT2i at next office visit.  Continue metoprolol  succinate 25 mg daily. Instructed her to begin Lasix  40 mg BID d/t weight gain and what sounds like some volume overload. Most recent kidney function was normal. Low sodium diet, fluid restriction <2L, and daily weights encouraged. Educated to contact our office for weight gain of 2 lbs overnight or 5 lbs in one week. Denies any shocks. Last remote device check revealed normal device function. Continue to follow-up with EP.  Genetic testing has been discussed with patient. She verbalized understanding.    2. CAD Stable with no anginal symptoms. No indication for ischemic evaluation. Not on aspirin , okay to start Eliquis  as mentioned above. Continue  atorvastatin . Heart healthy diet and regular cardiovascular exercise encouraged. Care and ED precautions discussed.  3. HTN BP stable. Discussed to monitor BP at home at least 2 hours after medications and sitting for 5-10 minutes. Encouraged adequate water intake. She verbalized understanding. Heart healthy diet and regular cardiovascular exercise encouraged.  See medication changes noted above.  4. HLD LDL from 09/2022 was 133. I question the compliance with atorvastatin . Continue atorvastatin . Continue to follow-up with Endo and PCP. Heart healthy diet and regular cardiovascular exercise encouraged.   I spent a total duration of 30 minutes reviewing prior notes, reviewing outside records including  labs,  face-to-face counseling of medical condition, pathophysiology, evaluation, management, and documenting the findings in the note.   Dispo: Follow-up with me/APP in 6 weeks or sooner if anything changes.   Signed, Almarie Crate, NP

## 2023-11-27 ENCOUNTER — Other Ambulatory Visit: Payer: Self-pay | Admitting: Nurse Practitioner

## 2023-11-27 DIAGNOSIS — E1165 Type 2 diabetes mellitus with hyperglycemia: Secondary | ICD-10-CM

## 2023-12-18 ENCOUNTER — Ambulatory Visit: Admitting: Nurse Practitioner

## 2023-12-18 ENCOUNTER — Encounter: Payer: Self-pay | Admitting: Nurse Practitioner

## 2023-12-18 VITALS — BP 114/70 | HR 87 | Ht 67.5 in

## 2023-12-18 DIAGNOSIS — Z7984 Long term (current) use of oral hypoglycemic drugs: Secondary | ICD-10-CM | POA: Diagnosis not present

## 2023-12-18 DIAGNOSIS — E1165 Type 2 diabetes mellitus with hyperglycemia: Secondary | ICD-10-CM | POA: Diagnosis not present

## 2023-12-18 DIAGNOSIS — I10 Essential (primary) hypertension: Secondary | ICD-10-CM

## 2023-12-18 DIAGNOSIS — E782 Mixed hyperlipidemia: Secondary | ICD-10-CM | POA: Diagnosis not present

## 2023-12-18 DIAGNOSIS — Z91148 Patient's other noncompliance with medication regimen for other reason: Secondary | ICD-10-CM | POA: Diagnosis not present

## 2023-12-18 DIAGNOSIS — Z794 Long term (current) use of insulin: Secondary | ICD-10-CM | POA: Diagnosis not present

## 2023-12-18 DIAGNOSIS — E559 Vitamin D deficiency, unspecified: Secondary | ICD-10-CM

## 2023-12-18 LAB — POCT UA - MICROALBUMIN

## 2023-12-18 LAB — POCT GLYCOSYLATED HEMOGLOBIN (HGB A1C): Hemoglobin A1C: 8.5 % — AB (ref 4.0–5.6)

## 2023-12-18 MED ORDER — METFORMIN HCL ER 500 MG PO TB24: 1000.0000 mg | ORAL_TABLET | Freq: Two times a day (BID) | ORAL | 3 refills | Status: DC

## 2023-12-18 MED ORDER — GLIPIZIDE ER 5 MG PO TB24: 5.0000 mg | ORAL_TABLET | Freq: Every day | ORAL | 3 refills | Status: DC

## 2023-12-18 MED ORDER — NOVOLOG MIX 70/30 FLEXPEN (70-30) 100 UNIT/ML ~~LOC~~ SUPN: 75.0000 [IU] | PEN_INJECTOR | Freq: Two times a day (BID) | SUBCUTANEOUS | 3 refills | Status: AC

## 2023-12-18 NOTE — Progress Notes (Signed)
 12/18/2023, 12:26 PM  Endocrinology follow-up note                              Subjective:    [name], female    DOB: [birth year].  Tara Bryant is being seen in follow-up in  management of currently uncontrolled symptomatic diabetes requested by  Cook, Jayce G, DO.   Past Medical History:  Diagnosis Date   AICD (automatic cardioverter/defibrillator) present    Arrhythmia    Arthritis    CAD (coronary artery disease)    a. 03/2018: cath showing 60% mid-LAD stenosis not significant by FFR.    Chronic combined systolic (congestive) and diastolic (congestive) heart failure (HCC)    a. 03/2018: found to have a newly reduced EF of 20% by echocardiogram   Depression    denies   Diabetes mellitus    GERD (gastroesophageal reflux disease)    h/o of   Hypertension    Microproteinuria    S/P thyroid  biopsy 2020   follow up once a year,    Sleep apnea    Past Surgical History:  Procedure Laterality Date   ANTERIOR CERVICAL DECOMP/DISCECTOMY FUSION N/A 12/03/2019   Procedure: ANTERIOR CERVICAL DECOMPRESSION/DISCECTOMY FUSION CERVICAL SIX- CERVICAL SEVEN;  Surgeon: Lanis Pupa, MD;  Location: [facility] OR;  Service: Neurosurgery;  Laterality: N/A;  ANTERIOR CERVICAL DECOMPRESSION/DISCECTOMY FUSION CERVICAL SIX- CERVICAL SEVEN   BACK SURGERY  2010   BREAST BIOPSY Left 2017   benign   CARDIAC CATHETERIZATION     CORONARY PRESSURE/FFR STUDY N/A 03/24/2018   Procedure: INTRAVASCULAR PRESSURE WIRE/FFR STUDY;  Surgeon: Anner Alm ORN, MD;  Location: [facility] INVASIVE CV LAB;  Service: Cardiovascular;  Laterality: N/A;   ICD IMPLANT N/A 10/29/2018   Procedure: ICD IMPLANT;  Surgeon: Waddell Danelle ORN, MD;  Location: [facility] INVASIVE CV LAB;  Service: Cardiovascular;  Laterality: N/A;   NOSE SURGERY     RIGHT/LEFT HEART CATH AND CORONARY ANGIOGRAPHY N/A 03/24/2018   Procedure: RIGHT/LEFT HEART CATH AND  CORONARY ANGIOGRAPHY;  Surgeon: Anner Alm ORN, MD;  Location: [facility] INVASIVE CV LAB;  Service: Cardiovascular;  Laterality: N/A;   TONSILLECTOMY     TOOTH EXTRACTION     September 2022   WISDOM TOOTH EXTRACTION     Social History   Socioeconomic History   Marital status: Divorced    Spouse name: Not on file   Number of children: 1   Years of education: Not on file   Highest education level: Not on file  Occupational History   Occupation: Home maker  Tobacco Use   Smoking status: Never   Smokeless tobacco: Never  Vaping Use   Vaping status: Never Used  Substance and Sexual Activity   Alcohol use: No   Drug use: No   Sexual activity: Yes    Birth control/protection: Pill  Other Topics Concern   Not on file  Social History Narrative   Not on file   Social Drivers of Health   Financial Resource Strain: Not on file  Food Insecurity: Food Insecurity Present (08/21/2023)   Hunger Vital Sign  Worried About Programme researcher, broadcasting/film/video in the Last Year: Sometimes true    [employer] of Food in the Last Year: Never true  Transportation Needs: No Transportation Needs (08/21/2023)   PRAPARE - Administrator, Civil Service (Medical): No    Lack of Transportation (Non-Medical): No  Physical Activity: Not on file  Stress: Not on file  Social Connections: Not on file   Outpatient Encounter Medications as of 12/18/2023  Medication Sig   ACCU-CHEK GUIDE TEST test strip USE TO TEST BLOOD GLUCOSE 3 TIMES A DAY   atorvastatin  (LIPITOR) 10 MG tablet Take 1 tablet (10 mg total) by mouth daily.   B-D ULTRAFINE III SHORT PEN 31G X 8 MM MISC USE TO INJECT INSULIN  TWICE DAILY   Blood Glucose Monitoring Suppl (ACCU-CHEK GUIDE ME) w/Device KIT Use to test BG tid. Dx E11.65   furosemide  (LASIX ) 40 MG tablet Take 1 tablet (40 mg total) by mouth 2 (two) times daily. Take 2 tablets (40 mg total) by mouth daily.  One in the morning and one after lunch.   insulin  isophane & regular human KwikPen  (NOVOLIN  70/30 KWIKPEN) (70-30) 100 UNIT/ML KwikPen INJECT 75 UNITS WITH BREAKFAST AND 75 UNITS WITH SUPPER IF GLUCOSE IS ABOVE 90 AND SHE IS EATING   Lancets MISC For Accu-chek smartview E11.65 Use to test tid.   metoprolol  succinate (TOPROL -XL) 25 MG 24 hr tablet Take 1 tablet (25 mg total) by mouth daily.   nystatin-triamcinolone  ointment (MYCOLOG) Apply a small amount to the affected area topically two times daily for 10 days as needed   potassium chloride  (KLOR-CON ) 10 MEQ tablet Take 1 tablet (10 mEq total) by mouth daily.   [DISCONTINUED] glipiZIDE  (GLUCOTROL  XL) 5 MG 24 hr tablet TAKE 1 TABLET BY MOUTH EVERY DAY WITH BREAKFAST   [DISCONTINUED] insulin  aspart protamine - aspart (NOVOLOG  MIX 70/30 FLEXPEN) (70-30) 100 UNIT/ML FlexPen Inject by subcutaneous route. (Patient taking differently: Inject 75 Units into the skin 2 (two) times daily with a meal. Patient states that she is taking 75 units in the morning. No PM dose.)   [DISCONTINUED] metFORMIN  (GLUCOPHAGE -XR) 500 MG 24 hr tablet TAKE 2 TABLETS BY MOUTH EVERY DAY WITH BREAKFAST   apixaban  (ELIQUIS ) 5 MG TABS tablet Take 1 tablet (5 mg total) by mouth 2 (two) times daily. (Patient not taking: Reported on 12/18/2023)   glipiZIDE  (GLUCOTROL  XL) 5 MG 24 hr tablet Take 1 tablet (5 mg total) by mouth daily with breakfast.   insulin  aspart protamine - aspart (NOVOLOG  MIX 70/30 FLEXPEN) (70-30) 100 UNIT/ML FlexPen Inject 75 Units into the skin 2 (two) times daily with a meal.   metFORMIN  (GLUCOPHAGE -XR) 500 MG 24 hr tablet Take 2 tablets (1,000 mg total) by mouth 2 (two) times daily with a meal.   [DISCONTINUED] doxycycline  (VIBRA -TABS) 100 MG tablet Take 1 tablet (100 mg total) by mouth 2 (two) times daily. (Patient not taking: Reported on 12/18/2023)   No facility-administered encounter medications on file as of 12/18/2023.    ALLERGIES: Allergies  Allergen Reactions   Aspartame And Phenylalanine Dermatitis    VACCINATION  STATUS: Immunization History  Administered Date(s) Administered   Influenza Split 02/02/2013   Influenza, Seasonal, Injecte, Preservative Fre 01/27/2023   Influenza,inj,Quad PF,6+ Mos 01/11/2014, 12/15/2014, 12/15/2015, 01/03/2017, 01/28/2018, 02/03/2019, 02/17/2020, 02/16/2021, 12/28/2021   Influenza-Unspecified 03/08/2011   PNEUMOCOCCAL CONJUGATE-20 07/28/2023   Pneumococcal Polysaccharide-23 03/30/2009   Td 02/04/2006   Tdap 02/26/2018    Diabetes She presents for her follow-up  diabetic visit. She has type 2 diabetes mellitus. Onset time: She was diagnosed at approximate age of 29 years. Her disease course has been improving. Hypoglycemia symptoms include nervousness/anxiousness, sweats and tremors. Pertinent negatives for hypoglycemia include no confusion, headaches, pallor or seizures. Associated symptoms include blurred vision, fatigue and visual change. Pertinent negatives for diabetes include no chest pain, no polydipsia, no polyphagia, no polyuria and no weight loss. There are no hypoglycemic complications. Symptoms are stable. Diabetic complications include heart disease. Risk factors for coronary artery disease include diabetes mellitus, dyslipidemia, hypertension, family history, sedentary lifestyle and stress. Current diabetic treatment includes insulin  injections and oral agent (dual therapy). She is compliant with treatment some of the time (often missed her second dose of insulin ). Her weight is fluctuating minimally. She is following a generally unhealthy diet. When asked about meal planning, she reported none. She has had a previous visit with a dietitian. She never participates in exercise. Her home blood glucose trend is fluctuating minimally. Her overall blood glucose range is >200 mg/dl. (She presents today with her meter, no logs showing inconsistent glucose monitoring with fluctuating readings.  Her POCT A1c today is 8.5% improving from last visit of 10.7%.   Analysis of her  meter shows 7-day average of 209 with 11 readings; 14-day average of 209 with 20 readings; 30-day average of 216 with 41 readings; 90-day average of 222 with 66 readings.  She is working a new job now and sometimes has low sugars when she gets home.  She notes she skips her night does of insulin  most days.) An ACE inhibitor/angiotensin II receptor blocker is being taken. She does not see a podiatrist.Eye exam is current.  Hyperlipidemia This is a chronic problem. The current episode started more than 1 year ago. The problem is uncontrolled. Recent lipid tests were reviewed and are high. Exacerbating diseases include diabetes. Factors aggravating her hyperlipidemia include beta blockers and fatty foods. Pertinent negatives include no chest pain, myalgias or shortness of breath. Current antihyperlipidemic treatment includes statins. Compliance problems include adherence to diet and adherence to exercise.  Risk factors for coronary artery disease include dyslipidemia, diabetes mellitus, hypertension, a sedentary lifestyle and family history.  Hypertension This is a chronic problem. The current episode started more than 1 year ago. The problem has been gradually improving since onset. The problem is controlled. Associated symptoms include blurred vision and sweats. Pertinent negatives include no chest pain, headaches, palpitations or shortness of breath. There are no associated agents to hypertension. Risk factors for coronary artery disease include diabetes mellitus, dyslipidemia, family history and sedentary lifestyle. Past treatments include beta blockers, diuretics and angiotensin blockers. The current treatment provides significant improvement. There are no compliance problems.  Hypertensive end-organ damage includes CAD/MI and heart failure.     Review of systems  Constitutional: + stable body weight,  current Body mass index is 29.81 kg/m. , no fatigue, no subjective hyperthermia, no subjective  hypothermia Eyes: no blurry vision, no xerophthalmia ENT: no sore throat, no nodules palpated in throat, no dysphagia/odynophagia, no hoarseness Cardiovascular: no chest pain, some intermittent SOB r/t CHF, no palpitations, no leg swelling Respiratory: no cough, some intermittent shortness of breath r/t CHF Gastrointestinal: no nausea/vomiting/diarrhea Musculoskeletal: no muscle/joint aches Skin: no rashes, no hyperemia,  Neurological: no tremors, no numbness, no tingling, no dizziness Psychiatric: + depression, + intermittent anxiety   Objective:    BP 114/70 (BP Location: Right Arm, Patient Position: Sitting, Cuff Size: Large)   Pulse 87   Ht 5'  7.5 (1.715 m)   LMP 09/21/2019   BMI 29.81 kg/m   Wt Readings from Last 3 Encounters:  11/25/23 193 lb 3.2 oz (87.6 kg)  10/09/23 188 lb (85.3 kg)  09/19/23 199 lb 12.8 oz (90.6 kg)    BP Readings from Last 3 Encounters:  12/18/23 114/70  11/25/23 126/66  10/09/23 120/70     Physical Exam- Limited  Constitutional:  Body mass index is 29.81 kg/m. , not in acute distress, anxious, depressed state of mind, avoids eye contact Eyes:  EOMI, no exophthalmos Musculoskeletal: no gross deformities, strength intact in all four extremities, no gross restriction of joint movements Skin:  no rashes, no hyperemia Neurological: no tremor with outstretched hands  Diabetic Foot Exam - Simple   No data filed      CMP ( most recent) CMP     Component Value Date/Time   NA 134 (L) 10/28/2023 0837   NA 138 04/03/2018 1240   K 3.6 10/28/2023 0837   CL 101 10/28/2023 0837   CO2 23 10/28/2023 0837   GLUCOSE 334 (H) 10/28/2023 0837   BUN 20 10/28/2023 0837   BUN 20 04/03/2018 1240   CREATININE 0.99 10/28/2023 0837   CREATININE 0.74 04/06/2014 0934   CALCIUM  8.3 (L) 10/28/2023 0837   PROT 7.4 09/16/2022 0910   PROT 6.5 03/26/2017 0916   ALBUMIN 3.8 09/16/2022 0910   ALBUMIN 4.0 03/26/2017 0916   AST 21 09/16/2022 0910   ALT 30  09/16/2022 0910   ALKPHOS 72 09/16/2022 0910   BILITOT 0.6 09/16/2022 0910   BILITOT 0.4 03/26/2017 0916   GFRNONAA >60 10/28/2023 0837   GFRAA >60 01/06/2020 1053     Diabetic Labs (most recent): Lab Results  Component Value Date   HGBA1C 8.5 (A) 12/18/2023   HGBA1C 10.7 (A) 05/19/2023   HGBA1C 12.0 (A) 02/10/2023   MICROALBUR 80mg /L 12/18/2023   MICROALBUR 13.0 (H) 09/16/2022   MICROALBUR 21.8 (H) 05/12/2020     Lipid Panel ( most recent) Lipid Panel     Component Value Date/Time   CHOL 194 09/16/2022 0911   CHOL 202 (H) 03/26/2017 0916   TRIG 111 09/16/2022 0911   HDL 39 (L) 09/16/2022 0911   HDL 45 03/26/2017 0916   CHOLHDL 5.0 09/16/2022 0911   VLDL 22 09/16/2022 0911   LDLCALC 133 (H) 09/16/2022 0911   LDLCALC 134 (H) 03/26/2017 0916      Assessment & Plan:   1) Uncontrolled type 2 diabetes mellitus with hyperglycemia (HCC)  - [name] has currently uncontrolled symptomatic type 2 DM since 53 years of age.  She presents today with her meter, no logs showing inconsistent glucose monitoring with fluctuating readings.  Her POCT A1c today is 8.5% improving from last visit of 10.7%.   Analysis of her meter shows 7-day average of 209 with 11 readings; 14-day average of 209 with 20 readings; 30-day average of 216 with 41 readings; 90-day average of 222 with 66 readings.  She is working a new job now and sometimes has low sugars when she gets home.  She notes she skips her night does of insulin  most days.  her diabetes is complicated by coronary artery disease/CHF and she remains at a high risk for more acute and chronic complications which include CAD, CVA, CKD, retinopathy, and neuropathy. These are all discussed in detail with her.  - Nutritional counseling repeated at each appointment due to patients tendency to fall back in to old habits.  - The  patient admits there is a room for improvement in their diet and drink choices. -  Suggestion is made for the  patient to avoid simple carbohydrates from their diet including Cakes, Sweet Desserts / Pastries, Ice Cream, Soda (diet and regular), Sweet Tea, Candies, Chips, Cookies, Sweet Pastries, Store Bought Juices, Alcohol in Excess of 1-2 drinks a day, Artificial Sweeteners, Coffee Creamer, and Sugar-free Products. This will help patient to have stable blood glucose profile and potentially avoid unintended weight gain.   - I encouraged the patient to switch to unprocessed or minimally processed complex starch and increased protein intake (animal or plant source), fruits, and vegetables.   - Patient is advised to stick to a routine mealtimes to eat 3 meals a day and avoid unnecessary snacks (to snack only to correct hypoglycemia).  - I have approached her with the following individualized plan to manage diabetes and patient agrees:   -She is advised to lower Novolog  70/30 to 65 units with breakfast and 30 units with supper if glucose is above 90 and she is eating but to be more consistent with taking them.  She is advised to continue Metformin  1000 mg ER daily after breakfast and Glipizide  5 mg XL daily at breakfast.  We discussed the importance of compliance to her medication regimen, I urged her to focus on her health.  -She is urged to continue monitoring blood glucose at least three times per day, before injecting insulin  (at breakfast and supper) and before bed.   - Patient specific target  A1c; LDL, HDL, Triglycerides, were discussed in detail.  2) Blood Pressure /Hypertension: Her blood pressure is controlled to target.  She is advised to continue meds as prescribed by cardiology.  She sees Dr. Bensimhon for CHF.  3) Lipids/Hyperlipidemia:  Her most recent lipid panel from 09/16/22 shows uncontrolled LDL of 133.  She is advised to continue Lipitor 10 mg po daily at bedtime.  Side effects and precautions discussed with her.    4)  Weight/Diet:  Her Body mass index is 29.81 kg/m.-she is a  candidate for mild weight loss.  I discussed with her the fact that loss of 5 - 10% of her  current body weight will have the most impact on her diabetes management.  CDE Consult will be initiated . Exercise, and detailed carbohydrates information provided  -  detailed on discharge instructions.  5) Chronic Care/Health Maintenance: -she is on ACEI/ARB medications and statin medications and is encouraged to initiate and continue to follow up with Ophthalmology, Dentist,  Podiatrist at least yearly or according to recommendations, and advised to  stay away from smoking. I have recommended yearly flu vaccine and pneumonia vaccine at least every 5 years; moderate intensity exercise for up to 150 minutes weekly; and  sleep for at least 7 hours a day.  - she is advised to maintain close follow up with Cook, Jayce G, DO for primary care needs, as well as her other providers for optimal and coordinated care.     I spent  46  minutes in the care of the patient today including review of labs from CMP, Lipids, Thyroid  Function, Hematology (current and previous including abstractions from other facilities); face-to-face time discussing  her blood glucose readings/logs, discussing hypoglycemia and hyperglycemia episodes and symptoms, medications doses, her options of short and long term treatment based on the latest standards of care / guidelines;  discussion about incorporating lifestyle medicine;  and documenting the encounter. Risk reduction counseling performed per USPSTF  guidelines to reduce obesity and cardiovascular risk factors.     Please refer to Patient Instructions for Blood Glucose Monitoring and Insulin /Medications Dosing Guide  in media tab for additional information. Please  also refer to  Patient Self Inventory in the Media  tab for reviewed elements of pertinent patient history.  [name] participated in the discussions, expressed understanding, and voiced agreement with the above  plans.  All questions were answered to her satisfaction. she is encouraged to contact clinic should she have any questions or concerns prior to her return visit.    Follow up plan: - Return in about 3 months (around 03/18/2024) for Diabetes F/U with A1c in office, No previsit labs, Bring meter and logs.  Benton Rio, [facility] [address] [place], [place] [address] Phone: [phone] Fax: [phone]  12/18/2023, 12:26 PM

## 2023-12-26 NOTE — Progress Notes (Signed)
 ICM remote transmission rescheduled for 01/07/2024.

## 2023-12-29 ENCOUNTER — Ambulatory Visit: Attending: Internal Medicine

## 2023-12-29 ENCOUNTER — Encounter

## 2023-12-29 ENCOUNTER — Telehealth: Payer: Self-pay

## 2023-12-29 DIAGNOSIS — Z95810 Presence of automatic (implantable) cardiac defibrillator: Secondary | ICD-10-CM

## 2023-12-29 DIAGNOSIS — I5022 Chronic systolic (congestive) heart failure: Secondary | ICD-10-CM

## 2023-12-29 NOTE — Progress Notes (Signed)
 EPIC Encounter for ICM Monitoring  Patient Name: Tara Bryant is a 53 y.o. female Date: 12/29/2023 Primary Care Physican: Cook, Jayce G, DO Primary Cardiologist: Alvan Electrophysiologist: Waddell 04/16/2022 Weight: 195 lbs 09/16/2022 Office Weight: 193 lbs 05/29/2023 Weight: 191 lbs 10/09/2023 Office Weight: 188 lbs 10/28/2023 Weight: Not weighing at home                              Attempted call to patient and unable to reach.  Left detailed message per DPR regarding transmission.  Transmission results reviewed.    HeartLogic Heart Failure Index increased to 23 on 8/23 suggesting possible fluid accumulation but index has returned to 7 suggesting fluid levels returned to normal since 9/18.   Prescribed: Furosemide  40 mg Take 1 tablet by mouth twice a day (started on 7/3)  Potassium 10 mEq take by mouth daily   Labs: 10/28/2023 Creatinine 0.99, BUN 20, Potassium 3.6, Sodium 134, GFR >60 05/16/2023 Creatinine 0.93, BUN 21, Potassium 4.2, Sodium 134, GFR >60, Glucose 437 09/16/2022 Creatinine 0.78, BUN 14, Potassium 3.9, Sodium 135, GFR >60 A complete set of results can be found in Results Review.   Recommendations:   Left voice mail with ICM number and encouraged to call if experiencing any fluid symptoms.   Follow-up plan: ICM clinic phone appointment on 02/02/2024.   91 day device clinic remote transmission 01/30/2024.              EP/Cardiology next office visit: 01/15/2024 with Almarie Crate, NP.   Recall 02/03/2024 with Dr Waddell.              Copy of ICM check sent to Dr. Waddell.  3 Month HeartLogicT Heart Failure Index:    8 Day Data Trend:          Tara GORMAN Garner, RN 12/29/2023 2:50 PM

## 2023-12-29 NOTE — Telephone Encounter (Signed)
 Remote ICM transmission received.  Attempted call to patient regarding ICM remote transmission.  Left detailed message per DPR with ICM phone number to return call for any questions, concerns or fluid symptoms.

## 2024-01-07 ENCOUNTER — Encounter

## 2024-01-08 ENCOUNTER — Other Ambulatory Visit: Payer: Self-pay | Admitting: Cardiology

## 2024-01-08 NOTE — Progress Notes (Signed)
 Remote ICD Transmission

## 2024-01-09 ENCOUNTER — Encounter: Payer: Self-pay | Admitting: *Deleted

## 2024-01-15 ENCOUNTER — Ambulatory Visit: Admitting: Nurse Practitioner

## 2024-01-27 ENCOUNTER — Ambulatory Visit: Admitting: Family Medicine

## 2024-01-28 ENCOUNTER — Encounter: Payer: Self-pay | Admitting: Family Medicine

## 2024-01-28 ENCOUNTER — Ambulatory Visit (INDEPENDENT_AMBULATORY_CARE_PROVIDER_SITE_OTHER): Admitting: Family Medicine

## 2024-01-28 VITALS — BP 105/70 | HR 94 | Temp 97.9°F | Ht 67.5 in | Wt 187.0 lb

## 2024-01-28 DIAGNOSIS — I10 Essential (primary) hypertension: Secondary | ICD-10-CM | POA: Diagnosis not present

## 2024-01-28 DIAGNOSIS — Z794 Long term (current) use of insulin: Secondary | ICD-10-CM | POA: Diagnosis not present

## 2024-01-28 DIAGNOSIS — Z23 Encounter for immunization: Secondary | ICD-10-CM | POA: Diagnosis not present

## 2024-01-28 DIAGNOSIS — Z7984 Long term (current) use of oral hypoglycemic drugs: Secondary | ICD-10-CM

## 2024-01-28 DIAGNOSIS — I5022 Chronic systolic (congestive) heart failure: Secondary | ICD-10-CM | POA: Diagnosis not present

## 2024-01-28 DIAGNOSIS — R0989 Other specified symptoms and signs involving the circulatory and respiratory systems: Secondary | ICD-10-CM | POA: Diagnosis not present

## 2024-01-28 DIAGNOSIS — E782 Mixed hyperlipidemia: Secondary | ICD-10-CM

## 2024-01-28 DIAGNOSIS — E1165 Type 2 diabetes mellitus with hyperglycemia: Secondary | ICD-10-CM

## 2024-01-28 MED ORDER — ATORVASTATIN CALCIUM 10 MG PO TABS: 10.0000 mg | ORAL_TABLET | Freq: Every day | ORAL | 3 refills | Status: AC

## 2024-01-28 NOTE — Assessment & Plan Note (Signed)
 Advised patient that she needs to take her Eliquis .  Compliance is the main issue.  She is euvolemic here today.

## 2024-01-28 NOTE — Progress Notes (Signed)
 Subjective:  [name], female    DOB: 1970/09/19  Age: 53 y.o. MRN: [id number]  CC:   Chief Complaint  Patient presents with   6 month follow up    Work standing on feet all day and has knee and foot pain Stopped eliquis  due to boxes hitting her arm and possible bruising/ bleeding     HPI:  53 year old female with above-mentioned mental problems presents for follow-up.  Continues to follow closely with endocrinology.  Type 2 diabetes still uncontrolled but is improving.  Patient not taking her Eliquis .  Advised that she she needs to be compliant.  Blood pressure well-controlled.  Patient reports that she is having pain in her knees and her feet.  She believes that this is related to her job as she is standing all day long.  She works 12-hour shifts.  She states that she is mostly standing in 1 place.   Patient Active Problem List   Diagnosis Date Noted   Decreased pulses in feet 01/28/2024   Lower extremity edema 09/21/2023   Varicose veins of both lower extremities with pain 09/09/2022   Noncompliance 03/21/2022   CAD (coronary artery disease) 09/19/2021   Anxiety and depression 09/19/2021   OSA (obstructive sleep apnea) 09/30/2019   Chronic systolic heart failure (HCC) 02/04/2019   ICD (implantable cardioverter-defibrillator) in place 02/04/2019   Uncontrolled type 2 diabetes mellitus with hyperglycemia (HCC) 06/10/2018   Mixed hyperlipidemia 06/10/2018   Essential hypertension, benign 07/30/2012    Social Hx   Social History   Socioeconomic History   Marital status: Divorced    Spouse name: Not on file   Number of children: 1   Years of education: Not on file   Highest education level: Not on file  Occupational History   Occupation: Home maker  Tobacco Use   Smoking status: Never   Smokeless tobacco: Never  Vaping Use   Vaping status: Never Used  Substance and Sexual Activity   Alcohol use: No   Drug use: No   Sexual activity: Yes     Birth control/protection: Pill  Other Topics Concern   Not on file  Social History Narrative   Not on file   Social Drivers of Health   Financial Resource Strain: Not on file  Food Insecurity: Food Insecurity Present (08/21/2023)   Hunger Vital Sign    Worried About Running Out of Food in the Last Year: Sometimes true    Ran Out of Food in the Last Year: Never true  Transportation Needs: No Transportation Needs (08/21/2023)   PRAPARE - Administrator, Civil Service (Medical): No    Lack of Transportation (Non-Medical): No  Physical Activity: Not on file  Stress: Not on file  Social Connections: Not on file    Review of Systems Per HPI  Objective:  BP 105/70   Pulse 94   Temp 97.9 F (36.6 C)   Ht 5' 7.5 (1.715 m)   Wt 187 lb (84.8 kg)   LMP 09/21/2019   SpO2 97%   BMI 28.86 kg/m      01/28/2024    9:29 AM 12/18/2023    9:30 AM 11/25/2023    3:17 PM  BP/Weight  Systolic BP 105 114 126  Diastolic BP 70 70 66  Wt. (Lbs) 187  193.2  BMI 28.86 kg/m2  29.81 kg/m2    Physical Exam Vitals and nursing note reviewed.  Constitutional:      General: She is  not in acute distress.    Appearance: Normal appearance.  HENT:     Head: Normocephalic and atraumatic.  Eyes:     General:        Right eye: No discharge.        Left eye: No discharge.     Conjunctiva/sclera: Conjunctivae normal.  Cardiovascular:     Rate and Rhythm: Normal rate and regular rhythm.  Pulmonary:     Effort: Pulmonary effort is normal.     Breath sounds: Normal breath sounds. No wheezing, rhonchi or rales.  Feet:     Comments: Diminished pulses in the feet. Neurological:     Mental Status: She is alert.  Psychiatric:     Comments: Flat affect.  Depressed mood.     Lab Results  Component Value Date   WBC 6.9 05/16/2023   HGB 13.7 05/16/2023   HCT 40.5 05/16/2023   PLT 205 05/16/2023   GLUCOSE 334 (H) 10/28/2023   CHOL 194 09/16/2022   TRIG 111 09/16/2022   HDL 39 (L)  09/16/2022   LDLCALC 133 (H) 09/16/2022   ALT 30 09/16/2022   AST 21 09/16/2022   NA 134 (L) 10/28/2023   K 3.6 10/28/2023   CL 101 10/28/2023   CREATININE 0.99 10/28/2023   BUN 20 10/28/2023   CO2 23 10/28/2023   TSH 1.652 10/02/2021   INR 1.1 12/04/2019   HGBA1C 8.5 (A) 12/18/2023   MICROALBUR 80mg /L 12/18/2023     Assessment & Plan:  Decreased pulses in feet Assessment & Plan: Arranging ABI.  Orders: -     US  ARTERIAL ABI (SCREENING LOWER EXTREMITY)  Immunization due -     Flu vaccine trivalent PF, 6mos and older(Flulaval,Afluria,Fluarix,Fluzone)  Essential hypertension, benign Assessment & Plan: Stable.   Chronic systolic heart failure [facility]) Assessment & Plan: Advised patient that she needs to take her Eliquis .  Compliance is the main issue.  She is euvolemic here today.   Uncontrolled type 2 diabetes mellitus with hyperglycemia [facility]) Assessment & Plan: Continue close follow-up with endocrinology.  Continue current insulin  regimen as well as glipizide  and metformin .  Would benefit from SGLT2.   Mixed hyperlipidemia -     Atorvastatin  Calcium ; Take 1 tablet (10 mg total) by mouth daily.  Dispense: 90 tablet; Refill: 3    Follow-up: 6 months  Darrion Macaulay Bluford DO [facility]

## 2024-01-28 NOTE — Assessment & Plan Note (Addendum)
 Stable

## 2024-01-28 NOTE — Patient Instructions (Signed)
 Get inserts for your shoes.  Testing ordered to assess vasculature in the legs.  Tylenol  1000 mg 3 times daily as needed for pain.  Restart Eliquis .  Follow up in 6 months.

## 2024-01-28 NOTE — Assessment & Plan Note (Signed)
 Arranging ABI.

## 2024-01-28 NOTE — Assessment & Plan Note (Signed)
 Continue close follow-up with endocrinology.  Continue current insulin  regimen as well as glipizide  and metformin .  Would benefit from SGLT2.

## 2024-02-02 ENCOUNTER — Encounter

## 2024-02-02 ENCOUNTER — Ambulatory Visit (INDEPENDENT_AMBULATORY_CARE_PROVIDER_SITE_OTHER)

## 2024-02-02 DIAGNOSIS — I5022 Chronic systolic (congestive) heart failure: Secondary | ICD-10-CM

## 2024-02-02 LAB — CUP PACEART REMOTE DEVICE CHECK
Battery Remaining Longevity: 120 mo
Battery Remaining Percentage: 85 %
Brady Statistic RV Percent Paced: 0 %
Date Time Interrogation Session: 20251027044100
HighPow Impedance: 85 Ohm
Implantable Lead Connection Status: 753985
Implantable Lead Implant Date: 20200723
Implantable Lead Location: 753860
Implantable Lead Model: 293
Implantable Lead Serial Number: 443823
Implantable Pulse Generator Implant Date: 20200723
Lead Channel Impedance Value: 475 Ohm
Lead Channel Setting Pacing Amplitude: 2.5 V
Lead Channel Setting Pacing Pulse Width: 0.4 ms
Lead Channel Setting Sensing Sensitivity: 0.5 mV
Pulse Gen Serial Number: 256025
Zone Setting Status: 755011

## 2024-02-03 ENCOUNTER — Telehealth: Payer: Self-pay

## 2024-02-03 ENCOUNTER — Ambulatory Visit: Attending: Internal Medicine

## 2024-02-03 DIAGNOSIS — I5022 Chronic systolic (congestive) heart failure: Secondary | ICD-10-CM | POA: Diagnosis not present

## 2024-02-03 DIAGNOSIS — Z95810 Presence of automatic (implantable) cardiac defibrillator: Secondary | ICD-10-CM

## 2024-02-03 NOTE — Progress Notes (Signed)
 EPIC Encounter for ICM Monitoring  Patient Name: Tara Bryant is a 53 y.o. female Date: 02/03/2024 Primary Care Physican: Bluford Jacqulyn MATSU, DO Primary Cardiologist: Alvan Electrophysiologist: Waddell 04/16/2022 Weight: 195 lbs 09/16/2022 Office Weight: 193 lbs 05/29/2023 Weight: 191 lbs 10/09/2023 Office Weight: 188 lbs 10/28/2023 Weight: Not weighing at home 11/25/2023 Office Weight: 193 lbs                              Attempted call to patient and unable to reach.  Left detailed message per DPR regarding transmission.  Transmission results reviewed.    Since 12/29/2023 ICM Remote Transmission: HeartLogic Heart Failure Index is 0 suggesting normal fluid levels.   Prescribed: Furosemide  40 mg Take 1 tablet by mouth daily + 20 mg tablet to = 60 mg daily.  Can do additional 20 mg daily for shortness of breath as needed  Potassium 10 mEq take by mouth daily   Labs: 10/28/2023 Creatinine 0.99, BUN 20, Potassium 3.6, Sodium 134, GFR >60 05/16/2023 Creatinine 0.93, BUN 21, Potassium 4.2, Sodium 134, GFR >60, Glucose 437 09/16/2022 Creatinine 0.78, BUN 14, Potassium 3.9, Sodium 135, GFR >60 A complete set of results can be found in Results Review.   Recommendations:  Left voice mail with ICM number and encouraged to call if experiencing any fluid symptoms.   Follow-up plan: ICM clinic phone appointment on 03/08/2024.   91 day device clinic remote transmission 05/03/2024.              EP/Cardiology next office visit: 03/15/2024 with Almarie Crate, NP.   Recall 11/23/2024 with EP APP.              Copy of ICM check sent to Dr. Waddell.  Remote Monitoring Medically Necessary for Heart Failure Management.  3 Month HeartLogicT Heart Failure Index:    8 Day Data Trend:          [name], RN 02/03/2024 7:31 AM

## 2024-02-03 NOTE — Telephone Encounter (Signed)
 Remote ICM transmission received.  Attempted call to patient regarding ICM remote transmission.  Left detailed message per DPR with ICM phone number to return call for any questions, concerns or fluid symptoms.

## 2024-02-04 ENCOUNTER — Ambulatory Visit (HOSPITAL_COMMUNITY)

## 2024-02-05 ENCOUNTER — Ambulatory Visit: Payer: Self-pay | Admitting: Internal Medicine

## 2024-02-10 NOTE — Progress Notes (Signed)
 Remote ICD Transmission

## 2024-02-11 ENCOUNTER — Ambulatory Visit (HOSPITAL_COMMUNITY)
Admission: RE | Admit: 2024-02-11 | Discharge: 2024-02-11 | Disposition: A | Discharge status: Home / Self Care | Source: Ambulatory Visit | Attending: Family Medicine | Admitting: Family Medicine

## 2024-02-11 DIAGNOSIS — R0989 Other specified symptoms and signs involving the circulatory and respiratory systems: Secondary | ICD-10-CM | POA: Insufficient documentation

## 2024-02-11 DIAGNOSIS — E785 Hyperlipidemia, unspecified: Secondary | ICD-10-CM | POA: Diagnosis not present

## 2024-02-11 DIAGNOSIS — M79604 Pain in right leg: Secondary | ICD-10-CM | POA: Diagnosis not present

## 2024-02-11 DIAGNOSIS — M79605 Pain in left leg: Secondary | ICD-10-CM | POA: Diagnosis not present

## 2024-02-11 DIAGNOSIS — E1151 Type 2 diabetes mellitus with diabetic peripheral angiopathy without gangrene: Secondary | ICD-10-CM | POA: Diagnosis not present

## 2024-02-12 ENCOUNTER — Ambulatory Visit: Payer: Self-pay | Admitting: Family Medicine

## 2024-03-05 ENCOUNTER — Other Ambulatory Visit: Payer: Self-pay | Admitting: Nurse Practitioner

## 2024-03-08 ENCOUNTER — Ambulatory Visit: Attending: Internal Medicine

## 2024-03-08 DIAGNOSIS — I5022 Chronic systolic (congestive) heart failure: Secondary | ICD-10-CM | POA: Diagnosis not present

## 2024-03-08 DIAGNOSIS — Z95810 Presence of automatic (implantable) cardiac defibrillator: Secondary | ICD-10-CM | POA: Diagnosis not present

## 2024-03-09 ENCOUNTER — Telehealth: Payer: Self-pay

## 2024-03-09 NOTE — Telephone Encounter (Signed)
 Remote ICM transmission received.  Attempted call to patient regarding ICM remote transmission.  Left detailed message per DPR with ICM phone number to return call for any questions, concerns or fluid symptoms.

## 2024-03-09 NOTE — Progress Notes (Signed)
 EPIC Encounter for ICM Monitoring  Patient Name: Tara Bryant is a 53 y.o. female Date: 03/09/2024 Primary Care Physican: Cook, Jayce G, DO Primary Cardiologist: Alvan Electrophysiologist: Waddell 04/16/2022 Weight: 195 lbs 09/16/2022 Office Weight: 193 lbs 05/29/2023 Weight: 191 lbs 10/09/2023 Office Weight: 188 lbs 10/28/2023 Weight: Not weighing at home 11/25/2023 Office Weight: 193 lbs                              Attempted call to patient and unable to reach.  Left detailed message per DPR regarding transmission.  Transmission results reviewed.    Since 02/03/2024 ICM Remote Transmission: HeartLogic Heart Failure Index is 2 suggesting normal fluid levels.  Index suggesting possible fluid accumulation from 02/06/2024-02/14/2024.   Prescribed: Furosemide  40 mg Take 1 tablet by mouth daily + 20 mg tablet to = 60 mg daily.  Can do additional 20 mg daily for shortness of breath as needed  Potassium 10 mEq take by mouth daily   Labs: 10/28/2023 Creatinine 0.99, BUN 20, Potassium 3.6, Sodium 134, GFR >60 05/16/2023 Creatinine 0.93, BUN 21, Potassium 4.2, Sodium 134, GFR >60, Glucose 437 09/16/2022 Creatinine 0.78, BUN 14, Potassium 3.9, Sodium 135, GFR >60 A complete set of results can be found in Results Review.   Recommendations:  Left voice mail with ICM number and encouraged to call if experiencing any fluid symptoms.   Follow-up plan: ICM clinic phone appointment on 04/19/2024.   91 day device clinic remote transmission 05/03/2024.              EP/Cardiology next office visit: 03/15/2024 with Almarie Crate, NP.   Recall 11/23/2024 with EP APP.              Copy of ICM check sent to Dr. Waddell.  Remote Monitoring Medically Necessary for Heart Failure Management.  3 Month HeartLogicT Heart Failure Index:    8 Day Data Trend:          [name], RN 03/09/2024 1:11 PM

## 2024-03-15 ENCOUNTER — Ambulatory Visit: Admitting: Nurse Practitioner

## 2024-03-19 ENCOUNTER — Ambulatory Visit: Admitting: Gastroenterology

## 2024-03-19 ENCOUNTER — Encounter: Payer: Self-pay | Admitting: Gastroenterology

## 2024-03-19 ENCOUNTER — Ambulatory Visit: Admitting: Nurse Practitioner

## 2024-03-19 ENCOUNTER — Encounter: Payer: Self-pay | Admitting: Nurse Practitioner

## 2024-03-19 VITALS — BP 120/70 | HR 83 | Ht 68.0 in | Wt 189.0 lb

## 2024-03-19 DIAGNOSIS — Z7984 Long term (current) use of oral hypoglycemic drugs: Secondary | ICD-10-CM

## 2024-03-19 DIAGNOSIS — Z794 Long term (current) use of insulin: Secondary | ICD-10-CM | POA: Diagnosis not present

## 2024-03-19 DIAGNOSIS — E1165 Type 2 diabetes mellitus with hyperglycemia: Secondary | ICD-10-CM | POA: Diagnosis not present

## 2024-03-19 DIAGNOSIS — E559 Vitamin D deficiency, unspecified: Secondary | ICD-10-CM | POA: Diagnosis not present

## 2024-03-19 DIAGNOSIS — I5022 Chronic systolic (congestive) heart failure: Secondary | ICD-10-CM

## 2024-03-19 DIAGNOSIS — Z1211 Encounter for screening for malignant neoplasm of colon: Secondary | ICD-10-CM | POA: Diagnosis not present

## 2024-03-19 DIAGNOSIS — E782 Mixed hyperlipidemia: Secondary | ICD-10-CM

## 2024-03-19 DIAGNOSIS — I10 Essential (primary) hypertension: Secondary | ICD-10-CM

## 2024-03-19 DIAGNOSIS — Z91148 Patient's other noncompliance with medication regimen for other reason: Secondary | ICD-10-CM | POA: Diagnosis not present

## 2024-03-19 LAB — POCT GLYCOSYLATED HEMOGLOBIN (HGB A1C): HbA1c, POC (controlled diabetic range): 9.3 % — AB (ref 0.0–7.0)

## 2024-03-19 MED ORDER — PEN NEEDLES 31G X 5 MM MISC: 3 refills | Status: AC

## 2024-03-19 MED ORDER — GLIPIZIDE ER 5 MG PO TB24: 5.0000 mg | ORAL_TABLET | Freq: Every day | ORAL | 3 refills | Status: AC

## 2024-03-19 MED ORDER — METFORMIN HCL ER 500 MG PO TB24: 1000.0000 mg | ORAL_TABLET | Freq: Every day | ORAL | 3 refills | Status: AC

## 2024-03-19 NOTE — Patient Instructions (Signed)
 Your provider has ordered Cologuard testing as an option for colon cancer screening. This is performed by [employer] and may be out of network with your insurance. PRIOR to completing the test, it is YOUR responsibility to contact your insurance about covered benefits for this test. Your out of pocket expense could be anywhere from $0.00 to $649.00.   When you call to check coverage with your insurer, please provide the following information:   -The ONLY provider of Cologuard is [facility], Dispensing  - CPT code for Cologuard is [id number].  [employer] NPI # [id number]  -[employer] Tax ID # [id number]   We have already sent your demographic and insurance information to [employer] (phone number [phone]) and they should contact you within the next week regarding your test. If you have not heard from them within the next week, please call our office at [phone].  _______________________________________________________  If your blood pressure at your visit was 140/90 or greater, please contact your primary care physician to follow up on this.  _______________________________________________________  If you are age 89 or older, your body mass index should be between 23-30. Your Body mass index is 28.74 kg/m. If this is out of the aforementioned range listed, please consider follow up with your Primary Care Provider.  If you are age 59 or younger, your body mass index should be between 19-25. Your Body mass index is 28.74 kg/m. If this is out of the aformentioned range listed, please consider follow up with your Primary Care Provider.   ________________________________________________________  The  GI providers would like to encourage you to use MYCHART to communicate with providers for non-urgent requests or questions.  Due to long hold times on the telephone, sending your provider a message by [facility] may be a faster and more efficient  way to get a response.  Please allow 48 business hours for a response.  Please remember that this is for non-urgent requests.  _______________________________________________________  [facility] is using a team-based approach to care.  Your team is made up of your doctor and two to three APPS. Our APPS (Nurse Practitioners and Physician Assistants) work with your physician to ensure care continuity for you. They are fully qualified to address your health concerns and develop a treatment plan. They communicate directly with your gastroenterologist to care for you. Seeing the Advanced Practice Practitioners on your physician's team can help you by facilitating care more promptly, often allowing for earlier appointments, access to diagnostic testing, procedures, and other specialty referrals.

## 2024-03-19 NOTE — Progress Notes (Signed)
 03/19/2024, 11:58 AM  Endocrinology follow-up note                              Subjective:    [name], female    DOB: 1970/07/15.  Tara Bryant is being seen in follow-up in  management of currently uncontrolled symptomatic diabetes requested by  Cook, Jayce G, DO.   Past Medical History:  Diagnosis Date   AICD (automatic cardioverter/defibrillator) present    Arrhythmia    Arthritis    CAD (coronary artery disease)    a. 03/2018: cath showing 60% mid-LAD stenosis not significant by FFR.    Chronic combined systolic (congestive) and diastolic (congestive) heart failure (HCC)    a. 03/2018: found to have a newly reduced EF of 20% by echocardiogram   Depression    denies   Diabetes mellitus    GERD (gastroesophageal reflux disease)    h/o of   Hypertension    Microproteinuria    S/P thyroid  biopsy 2020   follow up once a year,    Sleep apnea    Past Surgical History:  Procedure Laterality Date   ANTERIOR CERVICAL DECOMP/DISCECTOMY FUSION N/A 12/03/2019   Procedure: ANTERIOR CERVICAL DECOMPRESSION/DISCECTOMY FUSION CERVICAL SIX- CERVICAL SEVEN;  Surgeon: Lanis Pupa, MD;  Location: [facility] OR;  Service: Neurosurgery;  Laterality: N/A;  ANTERIOR CERVICAL DECOMPRESSION/DISCECTOMY FUSION CERVICAL SIX- CERVICAL SEVEN   BACK SURGERY  2010   BREAST BIOPSY Left 2017   benign   CARDIAC CATHETERIZATION     CORONARY PRESSURE/FFR STUDY N/A 03/24/2018   Procedure: INTRAVASCULAR PRESSURE WIRE/FFR STUDY;  Surgeon: Anner Alm ORN, MD;  Location: [facility] INVASIVE CV LAB;  Service: Cardiovascular;  Laterality: N/A;   ICD IMPLANT N/A 10/29/2018   Procedure: ICD IMPLANT;  Surgeon: Waddell Danelle ORN, MD;  Location: [facility] INVASIVE CV LAB;  Service: Cardiovascular;  Laterality: N/A;   NOSE SURGERY     RIGHT/LEFT HEART CATH AND CORONARY ANGIOGRAPHY N/A 03/24/2018   Procedure: RIGHT/LEFT HEART CATH AND  CORONARY ANGIOGRAPHY;  Surgeon: Anner Alm ORN, MD;  Location: [facility] INVASIVE CV LAB;  Service: Cardiovascular;  Laterality: N/A;   TONSILLECTOMY     TOOTH EXTRACTION     September 2022   WISDOM TOOTH EXTRACTION     Social History   Socioeconomic History   Marital status: Divorced    Spouse name: Not on file   Number of children: 1   Years of education: Not on file   Highest education level: Not on file  Occupational History   Occupation: Home maker  Tobacco Use   Smoking status: Never   Smokeless tobacco: Never  Vaping Use   Vaping status: Never Used  Substance and Sexual Activity   Alcohol use: No   Drug use: No   Sexual activity: Yes    Birth control/protection: Pill  Other Topics Concern   Not on file  Social History Narrative   Not on file   Social Drivers of Health   Tobacco Use: Low Risk (03/19/2024)   Patient History    Smoking Tobacco Use: Never    Smokeless  Tobacco Use: Never    Passive Exposure: Not on file  Financial Resource Strain: Not on file  Food Insecurity: Food Insecurity Present (08/21/2023)   Hunger Vital Sign    Worried About Running Out of Food in the Last Year: Sometimes true    Ran Out of Food in the Last Year: Never true  Transportation Needs: No Transportation Needs (08/21/2023)   PRAPARE - Administrator, Civil Service (Medical): No    Lack of Transportation (Non-Medical): No  Physical Activity: Not on file  Stress: Not on file  Social Connections: Not on file  Depression (PHQ2-9): Low Risk (09/19/2023)   Depression (PHQ2-9)    PHQ-2 Score: 0  Alcohol Screen: Not on file  Housing: Unknown (08/21/2023)   Housing Stability Vital Sign    Unable to Pay for Housing in the Last Year: No    Number of Times Moved in the Last Year: Not on file    Homeless in the Last Year: No  Utilities: Not At Risk (08/21/2023)   AHC Utilities    Threatened with loss of utilities: No  Health Literacy: Not on file   Outpatient Encounter  Medications as of 03/19/2024  Medication Sig   insulin  aspart protamine - aspart (NOVOLOG  MIX 70/30 FLEXPEN) (70-30) 100 UNIT/ML FlexPen Inject 75 Units into the skin 2 (two) times daily with a meal. (Patient taking differently: Inject 75 Units into the skin daily with breakfast.)   Insulin  Pen Needle (PEN NEEDLES) 31G X 5 MM MISC Dispense based on patient and insurance preference. Use up to twice daily as directed. (FOR ICD-10 E10.9, E11.9).   [DISCONTINUED] metFORMIN  (GLUCOPHAGE -XR) 500 MG 24 hr tablet Take 2 tablets (1,000 mg total) by mouth 2 (two) times daily with a meal. (Patient taking differently: Take 1,000 mg by mouth daily with breakfast.)   ACCU-CHEK GUIDE TEST test strip USE TO TEST BLOOD GLUCOSE 3 TIMES A DAY   apixaban  (ELIQUIS ) 5 MG TABS tablet Take 1 tablet (5 mg total) by mouth 2 (two) times daily. (Patient not taking: Reported on 12/18/2023)   atorvastatin  (LIPITOR) 10 MG tablet Take 1 tablet (10 mg total) by mouth daily.   Blood Glucose Monitoring Suppl (ACCU-CHEK GUIDE ME) w/Device KIT Use to test BG tid. Dx E11.65   furosemide  (LASIX ) 40 MG tablet TAKE 1 TABLET BY MOUTH DAILY. WITH 20 MG TABLET TO EQUAL 60 MG DAILY. CAN DO ADDITIONAL 20 MG DAILY FOR SHORTNESS OF BREATHE AS NEEDED   glipiZIDE  (GLUCOTROL  XL) 5 MG 24 hr tablet Take 1 tablet (5 mg total) by mouth daily with breakfast.   Lancets MISC For Accu-chek smartview E11.65 Use to test tid.   metFORMIN  (GLUCOPHAGE -XR) 500 MG 24 hr tablet Take 2 tablets (1,000 mg total) by mouth daily with breakfast.   metoprolol  succinate (TOPROL -XL) 25 MG 24 hr tablet TAKE 1 TABLET (25 MG TOTAL) BY MOUTH DAILY.   nystatin-triamcinolone  ointment (MYCOLOG) Apply a small amount to the affected area topically two times daily for 10 days as needed   potassium chloride  (KLOR-CON ) 10 MEQ tablet Take 1 tablet (10 mEq total) by mouth daily.   [DISCONTINUED] B-D ULTRAFINE III SHORT PEN 31G X 8 MM MISC USE TO INJECT INSULIN  TWICE DAILY   [DISCONTINUED]  glipiZIDE  (GLUCOTROL  XL) 5 MG 24 hr tablet Take 1 tablet (5 mg total) by mouth daily with breakfast.   No facility-administered encounter medications on file as of 03/19/2024.    ALLERGIES: Allergies  Allergen Reactions   Aspartame And  Phenylalanine Dermatitis    VACCINATION STATUS: Immunization History  Administered Date(s) Administered   Influenza Split 02/02/2013   Influenza, Seasonal, Injecte, Preservative Fre 01/27/2023, 01/28/2024   Influenza,inj,Quad PF,6+ Mos 01/11/2014, 12/15/2014, 12/15/2015, 01/03/2017, 01/28/2018, 02/03/2019, 02/17/2020, 02/16/2021, 12/28/2021   Influenza-Unspecified 03/08/2011   PNEUMOCOCCAL CONJUGATE-20 07/28/2023   Pneumococcal Polysaccharide-23 03/30/2009   Td 02/04/2006   Tdap 02/26/2018    Diabetes She presents for her follow-up diabetic visit. She has type 2 diabetes mellitus. Onset time: She was diagnosed at approximate age of 29 years. Her disease course has been fluctuating. Hypoglycemia symptoms include nervousness/anxiousness and tremors. Pertinent negatives for hypoglycemia include no confusion, pallor or seizures. Associated symptoms include fatigue and visual change. Pertinent negatives for diabetes include no polydipsia, no polyphagia, no polyuria and no weight loss. There are no hypoglycemic complications. Symptoms are stable. Diabetic complications include heart disease. Risk factors for coronary artery disease include diabetes mellitus, dyslipidemia, hypertension, family history, sedentary lifestyle and stress. Current diabetic treatment includes insulin  injections and oral agent (dual therapy). She is compliant with treatment some of the time (often missed her second dose of insulin ). Her weight is fluctuating minimally. She is following a generally unhealthy and low salt diet. When asked about meal planning, she reported none. She has had a previous visit with a dietitian. She never participates in exercise. Her home blood glucose trend is  fluctuating dramatically. Her overall blood glucose range is >200 mg/dl. (She presents today with her meter, no logs showing inconsistent glucose monitoring with fluctuating readings.  Her POCT A1c today is 9.3%, increasing from last visit of 8.5%.   Analysis of her meter shows 7-day average of 253 with 12 readings; 14-day average of 285 with 20 readings; 30-day average of 247 with 42 readings; 90-day average of 237 with 130 readings.  She has been skipping her dinner dose of 70/30.) An ACE inhibitor/angiotensin II receptor blocker is being taken. She does not see a podiatrist.Eye exam is current.     Review of systems  Constitutional: + stable body weight,  current Body mass index is 29.26 kg/m. , no fatigue, no subjective hyperthermia, no subjective hypothermia Eyes: no blurry vision, no xerophthalmia ENT: no sore throat, no nodules palpated in throat, no dysphagia/odynophagia, no hoarseness Cardiovascular: no chest pain, some intermittent SOB r/t CHF, no palpitations, no leg swelling Respiratory: no cough, some intermittent shortness of breath r/t CHF Gastrointestinal: no nausea/vomiting/diarrhea Musculoskeletal: no muscle/joint aches Skin: no rashes, no hyperemia,  Neurological: no tremors, no numbness, no tingling, no dizziness Psychiatric: + depression, + intermittent anxiety   Objective:    BP 126/68   Pulse 76   Ht 5' 7.5 (1.715 m)   Wt 189 lb 9.6 oz (86 kg)   LMP 09/21/2019   BMI 29.26 kg/m   Wt Readings from Last 3 Encounters:  03/19/24 189 lb 9.6 oz (86 kg)  01/28/24 187 lb (84.8 kg)  11/25/23 193 lb 3.2 oz (87.6 kg)    BP Readings from Last 3 Encounters:  03/19/24 126/68  01/28/24 105/70  12/18/23 114/70     Physical Exam- Limited  Constitutional:  Body mass index is 29.26 kg/m. , not in acute distress, anxious, depressed state of mind, avoids eye contact Eyes:  EOMI, no exophthalmos Musculoskeletal: no gross deformities, strength intact in all four  extremities, no gross restriction of joint movements Skin:  no rashes, no hyperemia Neurological: no tremor with outstretched hands  Diabetic Foot Exam - Simple   No data filed  CMP ( most recent) CMP     Component Value Date/Time   NA 134 (L) 10/28/2023 0837   NA 138 04/03/2018 1240   K 3.6 10/28/2023 0837   CL 101 10/28/2023 0837   CO2 23 10/28/2023 0837   GLUCOSE 334 (H) 10/28/2023 0837   BUN 20 10/28/2023 0837   BUN 20 04/03/2018 1240   CREATININE 0.99 10/28/2023 0837   CREATININE 0.74 04/06/2014 0934   CALCIUM  8.3 (L) 10/28/2023 0837   PROT 7.4 09/16/2022 0910   PROT 6.5 03/26/2017 0916   ALBUMIN 3.8 09/16/2022 0910   ALBUMIN 4.0 03/26/2017 0916   AST 21 09/16/2022 0910   ALT 30 09/16/2022 0910   ALKPHOS 72 09/16/2022 0910   BILITOT 0.6 09/16/2022 0910   BILITOT 0.4 03/26/2017 0916   GFRNONAA >60 10/28/2023 0837   GFRAA >60 01/06/2020 1053     Diabetic Labs (most recent): Lab Results  Component Value Date   HGBA1C 9.3 (A) 03/19/2024   HGBA1C 8.5 (A) 12/18/2023   HGBA1C 10.7 (A) 05/19/2023   MICROALBUR 80mg /L 12/18/2023   MICROALBUR 13.0 (H) 09/16/2022   MICROALBUR 21.8 (H) 05/12/2020     Lipid Panel ( most recent) Lipid Panel     Component Value Date/Time   CHOL 194 09/16/2022 0911   CHOL 202 (H) 03/26/2017 0916   TRIG 111 09/16/2022 0911   HDL 39 (L) 09/16/2022 0911   HDL 45 03/26/2017 0916   CHOLHDL 5.0 09/16/2022 0911   VLDL 22 09/16/2022 0911   LDLCALC 133 (H) 09/16/2022 0911   LDLCALC 134 (H) 03/26/2017 0916      Assessment & Plan:   1) Uncontrolled type 2 diabetes mellitus with hyperglycemia (HCC)  - Tara Bryant has currently uncontrolled symptomatic type 2 DM since 53 years of age.  She presents today with her meter, no logs showing inconsistent glucose monitoring with fluctuating readings.  Her POCT A1c today is 9.3%, increasing from last visit of 8.5%.   Analysis of her meter shows 7-day average of 253 with 12 readings;  14-day average of 285 with 20 readings; 30-day average of 247 with 42 readings; 90-day average of 237 with 130 readings.  She has been skipping her dinner dose of 70/30.  her diabetes is complicated by coronary artery disease/CHF and she remains at a high risk for more acute and chronic complications which include CAD, CVA, CKD, retinopathy, and neuropathy. These are all discussed in detail with her.  - Nutritional counseling repeated/built upon at each appointment.  - The patient admits there is a room for improvement in their diet and drink choices. -  Suggestion is made for the patient to avoid simple carbohydrates from their diet including Cakes, Sweet Desserts / Pastries, Ice Cream, Soda (diet and regular), Sweet Tea, Candies, Chips, Cookies, Sweet Pastries, Store Bought Juices, Alcohol in Excess of 1-2 drinks a day, Artificial Sweeteners, Coffee Creamer, and Sugar-free Products. This will help patient to have stable blood glucose profile and potentially avoid unintended weight gain.   - I encouraged the patient to switch to unprocessed or minimally processed complex starch and increased protein intake (animal or plant source), fruits, and vegetables.   - Patient is advised to stick to a routine mealtimes to eat 3 meals a day and avoid unnecessary snacks (to snack only to correct hypoglycemia).  - I have approached her with the following individualized plan to manage diabetes and patient agrees:   -She is advised to start taking her Novolog  70/30 to 65  units with breakfast and 30 units with supper if glucose is above 90 and she is eating CONSISTENTLY.  She is advised to continue Metformin  1000 mg ER daily after breakfast and Glipizide  5 mg XL daily at breakfast.  We discussed the importance of compliance to her medication regimen, I urged her to focus on her health.  -She is urged to continue monitoring blood glucose at least three times per day, before injecting insulin  (at breakfast and  supper) and before bed.  We have not been successful at using CGM with her in the past.  She would likely need accommodations to have her CGM receiver with her at work.  - Patient specific target  A1c; LDL, HDL, Triglycerides, were discussed in detail.  2) Blood Pressure /Hypertension: Her blood pressure is controlled to target.  She is advised to continue meds as prescribed by cardiology.  She sees Dr. Bensimhon for CHF.  3) Lipids/Hyperlipidemia:  Her most recent lipid panel from 09/16/22 shows uncontrolled LDL of 133.  She is advised to continue Lipitor 10 mg po daily at bedtime.  Side effects and precautions discussed with her.    4)  Weight/Diet:  Her Body mass index is 29.26 kg/m.-she is a candidate for mild weight loss.  I discussed with her the fact that loss of 5 - 10% of her  current body weight will have the most impact on her diabetes management.  CDE Consult will be initiated . Exercise, and detailed carbohydrates information provided  -  detailed on discharge instructions.  5) Chronic Care/Health Maintenance: -she is on ACEI/ARB medications and statin medications and is encouraged to initiate and continue to follow up with Ophthalmology, Dentist,  Podiatrist at least yearly or according to recommendations, and advised to  stay away from smoking. I have recommended yearly flu vaccine and pneumonia vaccine at least every 5 years; moderate intensity exercise for up to 150 minutes weekly; and  sleep for at least 7 hours a day.  - she is advised to maintain close follow up with Cook, Jayce G, DO for primary care needs, as well as her other providers for optimal and coordinated care.     I spent  36  minutes in the care of the patient today including review of labs from CMP, Lipids, Thyroid  Function, Hematology (current and previous including abstractions from other facilities); face-to-face time discussing  her blood glucose readings/logs, discussing hypoglycemia and hyperglycemia  episodes and symptoms, medications doses, her options of short and long term treatment based on the latest standards of care / guidelines;  discussion about incorporating lifestyle medicine;  and documenting the encounter. Risk reduction counseling performed per USPSTF guidelines to reduce obesity and cardiovascular risk factors.     Please refer to Patient Instructions for Blood Glucose Monitoring and Insulin /Medications Dosing Guide  in media tab for additional information. Please  also refer to  Patient Self Inventory in the Media  tab for reviewed elements of pertinent patient history.  Tara Bryant participated in the discussions, expressed understanding, and voiced agreement with the above plans.  All questions were answered to her satisfaction. she is encouraged to contact clinic should she have any questions or concerns prior to her return visit.    Follow up plan: - Return in about 4 months (around 07/18/2024) for Diabetes F/U with A1c in office, No previsit labs, Bring meter and logs.  Benton Rio, [facility] [address] [place], [place] [address] Phone: [phone] Fax: [phone]  03/19/2024,  11:58 AM

## 2024-03-19 NOTE — Progress Notes (Signed)
 HPI : Tara Bryant is a 53 y.o. female with a history of advanced nonischemic cardiomyopathy status post AICD who is referred to us  by Cook, Jayce G, DO for colon cancer screening.  She has never had a colonoscopy or other colon cancer screening test. She has no family history of colon cancer, but her mother was recently found to have a rectal tubulovillous adenoma at the age of 67 (that was her first colonoscopy, performed to evaluate hematochezia)  She was initially seen in our office in November 2022 for colon cancer screening.  She was placed on the waiting list for a hospital-based colonoscopy at that time, but this procedure was never scheduled for some reason.  At that time, she was seeing recurrent bright red blood per rectum and it was felt that a stool based screening test would not be reliable.  Today, the patient states that she has not seen blood in her stool for a very long time.  She denies any concerning GI symptoms such as abdominal pain or changes in bowel habits.  She is followed by cardiology closely.  She denies any symptoms of decompensated heart failure such as dyspnea at rest or orthopnea.  She reports being fairly active at work, moving boxes throughout the day, but otherwise does not exercise.  She denies shortness of breath with routine activities such as grocery shopping, but states that she would be winded going up a single flight of stairs. Her most recent echocardiogram from January 2025 showed an EF of 20-25%.  Discussed the use of AI scribe software for clinical note transcription with the patient, who gave verbal consent to proceed.   TTE Jan 2025  1. Left ventricular ejection fraction, by estimation, is 20 to 25%. The  left ventricle has severely decreased function. The left ventricle  demonstrates global hypokinesis. Left ventricular diastolic parameters are  indeterminate. Elevated left  ventricular end-diastolic pressure.   2. Right ventricular  systolic function is normal. The right ventricular  size is normal. There is moderately elevated pulmonary artery systolic  pressure. The estimated right ventricular systolic pressure is 47.9 mmHg.   3. The mitral valve is grossly normal. Mild mitral valve regurgitation.  No evidence of mitral stenosis.   4. The aortic valve has an indeterminant number of cusps. Aortic valve  regurgitation is not visualized. No aortic stenosis is present.   5. The inferior vena cava is normal in size with greater than 50%  respiratory variability, suggesting right atrial pressure of 3 mmHg    Past Medical History:  Diagnosis Date   AICD (automatic cardioverter/defibrillator) present    Arrhythmia    Arthritis    CAD (coronary artery disease)    a. 03/2018: cath showing 60% mid-LAD stenosis not significant by FFR.    Chronic combined systolic (congestive) and diastolic (congestive) heart failure (HCC)    a. 03/2018: found to have a newly reduced EF of 20% by echocardiogram   Depression    denies   Diabetes mellitus    GERD (gastroesophageal reflux disease)    h/o of   Hypertension    Microproteinuria    S/P thyroid  biopsy 2020   follow up once a year,    Sleep apnea      Past Surgical History:  Procedure Laterality Date   ANTERIOR CERVICAL DECOMP/DISCECTOMY FUSION N/A 12/03/2019   Procedure: ANTERIOR CERVICAL DECOMPRESSION/DISCECTOMY FUSION CERVICAL SIX- CERVICAL SEVEN;  Surgeon: Lanis Pupa, MD;  Location: [facility] OR;  Service: Neurosurgery;  Laterality: N/A;  ANTERIOR CERVICAL DECOMPRESSION/DISCECTOMY FUSION CERVICAL SIX- CERVICAL SEVEN   BACK SURGERY  2010   BREAST BIOPSY Left 2017   benign   CARDIAC CATHETERIZATION     CORONARY PRESSURE/FFR STUDY N/A 03/24/2018   Procedure: INTRAVASCULAR PRESSURE WIRE/FFR STUDY;  Surgeon: Anner Alm ORN, MD;  Location: [facility] INVASIVE CV LAB;  Service: Cardiovascular;  Laterality: N/A;   ICD IMPLANT N/A 10/29/2018   Procedure: ICD IMPLANT;  Surgeon:  Waddell Danelle ORN, MD;  Location: [facility] INVASIVE CV LAB;  Service: Cardiovascular;  Laterality: N/A;   NOSE SURGERY     RIGHT/LEFT HEART CATH AND CORONARY ANGIOGRAPHY N/A 03/24/2018   Procedure: RIGHT/LEFT HEART CATH AND CORONARY ANGIOGRAPHY;  Surgeon: Anner Alm ORN, MD;  Location: [facility] INVASIVE CV LAB;  Service: Cardiovascular;  Laterality: N/A;   TONSILLECTOMY     TOOTH EXTRACTION     September 2022   WISDOM TOOTH EXTRACTION     Family History  Problem Relation Age of Onset   Breast cancer Mother    Hypertension Mother    Chronic Renal Failure Father    Stroke Sister 63       smoker   Breast cancer Sister    Diabetes Sister    Hypertension Sister    Heart attack Paternal Grandmother    Diabetes Maternal Aunt    Colon cancer Neg Hx    Social History[1] Current Outpatient Medications  Medication Sig Dispense Refill   ACCU-CHEK GUIDE TEST test strip USE TO TEST BLOOD GLUCOSE 3 TIMES A DAY 300 strip 2   apixaban  (ELIQUIS ) 5 MG TABS tablet Take 1 tablet (5 mg total) by mouth 2 (two) times daily. (Patient not taking: Reported on 12/18/2023)     atorvastatin  (LIPITOR) 10 MG tablet Take 1 tablet (10 mg total) by mouth daily. 90 tablet 3   Blood Glucose Monitoring Suppl (ACCU-CHEK GUIDE ME) w/Device KIT Use to test BG tid. Dx E11.65 1 kit 0   furosemide  (LASIX ) 40 MG tablet TAKE 1 TABLET BY MOUTH DAILY. WITH 20 MG TABLET TO EQUAL 60 MG DAILY. CAN DO ADDITIONAL 20 MG DAILY FOR SHORTNESS OF BREATHE AS NEEDED 30 tablet 4   glipiZIDE  (GLUCOTROL  XL) 5 MG 24 hr tablet Take 1 tablet (5 mg total) by mouth daily with breakfast. 90 tablet 3   insulin  aspart protamine - aspart (NOVOLOG  MIX 70/30 FLEXPEN) (70-30) 100 UNIT/ML FlexPen Inject 75 Units into the skin 2 (two) times daily with a meal. (Patient taking differently: Inject 75 Units into the skin daily with breakfast.) 135 mL 3   Insulin  Pen Needle (PEN NEEDLES) 31G X 5 MM MISC Dispense based on patient and insurance preference. Use up to twice daily  as directed. (FOR ICD-10 E10.9, E11.9). 200 each 3   Lancets MISC For Accu-chek smartview E11.65 Use to test tid. 300 each 2   metFORMIN  (GLUCOPHAGE -XR) 500 MG 24 hr tablet Take 2 tablets (1,000 mg total) by mouth daily with breakfast. 180 tablet 3   metoprolol  succinate (TOPROL -XL) 25 MG 24 hr tablet TAKE 1 TABLET (25 MG TOTAL) BY MOUTH DAILY. 90 tablet 2   nystatin-triamcinolone  ointment (MYCOLOG) Apply a small amount to the affected area topically two times daily for 10 days as needed     potassium chloride  (KLOR-CON ) 10 MEQ tablet Take 1 tablet (10 mEq total) by mouth daily. 90 tablet 3   No current facility-administered medications for this visit.   Allergies[2]   Review of Systems: All systems reviewed and negative except where noted in HPI.  No results found.  Physical Exam: BP 120/70   Pulse 83   Ht 5' 8 (1.727 m)   Wt 189 lb (85.7 kg)   LMP 09/21/2019   BMI 28.74 kg/m  Constitutional: Pleasant,well-developed, Caucasian female in no acute distress. HEENT: Normocephalic and atraumatic. Conjunctivae are normal. No scleral icterus. Neck supple.  Cardiovascular: Normal rate, regular rhythm.  Pulmonary/chest: Effort normal and breath sounds normal. No wheezing, rales or rhonchi. Abdominal: Soft, nondistended, nontender. Bowel sounds active throughout. There are no masses palpable. No hepatomegaly. Extremities: no edema Neurological: Alert and oriented to person place and time. Skin: Skin is warm and dry. No rashes noted. Psychiatric: Normal mood and affect. Behavior is normal.  CBC    Component Value Date/Time   WBC 6.9 05/16/2023 1139   RBC 4.42 05/16/2023 1139   HGB 13.7 05/16/2023 1139   HGB 13.8 04/03/2018 1240   HCT 40.5 05/16/2023 1139   HCT 41.5 04/03/2018 1240   PLT 205 05/16/2023 1139   PLT 304 04/03/2018 1240   MCV 91.6 05/16/2023 1139   MCV 89 04/03/2018 1240   MCH 31.0 05/16/2023 1139   MCHC 33.8 05/16/2023 1139   RDW 12.5 05/16/2023 1139   RDW  13.0 04/03/2018 1240   LYMPHSABS 2.2 03/23/2018 1031   LYMPHSABS 2.7 03/12/2018 1043   MONOABS 0.4 03/23/2018 1031   EOSABS 0.2 03/23/2018 1031   EOSABS 0.1 03/12/2018 1043   BASOSABS 0.0 03/23/2018 1031   BASOSABS 0.1 03/12/2018 1043    CMP     Component Value Date/Time   NA 134 (L) 10/28/2023 0837   NA 138 04/03/2018 1240   K 3.6 10/28/2023 0837   CL 101 10/28/2023 0837   CO2 23 10/28/2023 0837   GLUCOSE 334 (H) 10/28/2023 0837   BUN 20 10/28/2023 0837   BUN 20 04/03/2018 1240   CREATININE 0.99 10/28/2023 0837   CREATININE 0.74 04/06/2014 0934   CALCIUM  8.3 (L) 10/28/2023 0837   PROT 7.4 09/16/2022 0910   PROT 6.5 03/26/2017 0916   ALBUMIN 3.8 09/16/2022 0910   ALBUMIN 4.0 03/26/2017 0916   AST 21 09/16/2022 0910   ALT 30 09/16/2022 0910   ALKPHOS 72 09/16/2022 0910   BILITOT 0.6 09/16/2022 0910   BILITOT 0.4 03/26/2017 0916   GFRNONAA >60 10/28/2023 0837   GFRAA >60 01/06/2020 1053       Latest Ref Rng & Units 05/16/2023   11:39 AM 09/16/2022    9:10 AM 05/03/2020   12:36 PM  CBC EXTENDED  WBC 4.0 - 10.5 K/uL 6.9  7.1  4.5   RBC 3.87 - 5.11 MIL/uL 4.42  4.16  4.54   Hemoglobin 12.0 - 15.0 g/dL 86.2  86.6  85.8   HCT 36.0 - 46.0 % 40.5  38.1  40.8   Platelets 150 - 400 K/uL 205  221  186       ASSESSMENT AND PLAN:  53 year old female overdue for initial colon cancer screening.  No family history of colon cancer, but her mother did have an advanced polyp at the age of 29.  The patient was having issues with bright red blood per rectum 3 years ago, but has not seen blood in her stool for quite some time now.  She has a significant cardiomyopathy with an EF of 20-25% and poor exercise tolerance.  I feel like she would be fairly high risk for a sedated procedure, especially for a screening test.  I recommended a Cologuard.  If this test is positive,  then the indication for a sedated procedure will be stronger.  Colorectal cancer screening She is due for screening.  High-risk for sedation during colonoscopy due to low ejection fraction. Prefers Cologuard. - Ordered Cologuard test. - If positive, schedule colonoscopy at [facility] due to severely low EF   Recording duration: 22 minutes     I spent a total of 35 minutes reviewing the patient's medical record, interviewing and examining the patient, discussing her diagnosis and management of her condition going forward, and documenting in the medical record  Orlyn Odonoghue E. Stacia, MD [facility]   Bluford Jacqulyn MATSU, DO     [1]  Social History Tobacco Use   Smoking status: Never   Smokeless tobacco: Never  Vaping Use   Vaping status: Never Used  Substance Use Topics   Alcohol use: No   Drug use: No  [2]  Allergies Allergen Reactions   Aspartame And Phenylalanine Dermatitis

## 2024-03-29 ENCOUNTER — Ambulatory Visit: Payer: Self-pay

## 2024-03-29 NOTE — Telephone Encounter (Signed)
" °  FYI Only or Action Required?: FYI only for provider: appointment scheduled on 03/30/2024.  Patient was last seen in primary care on 01/28/2024 by Cook, Jayce G, DO.  Called Nurse Triage reporting Cough.  Symptoms began a week ago.  Interventions attempted: OTC medications: Mucinex DM.  Symptoms are: unchanged.  Triage Disposition: Home Care  Patient/caregiver understands and will follow disposition?: Yes Copied from CRM #[id number]. Topic: Clinical - Red Word Triage >> Mar 29, 2024  9:54 AM Rosaria BRAVO wrote: Red Word that prompted transfer to Nurse Triage: discolored phlegm, positive for flu with home test.  Reason for Disposition  Cough with cold symptoms (e.g., runny nose, postnasal drip, throat clearing)  Answer Assessment - Initial Assessment Questions Saturday took test. COVID negative, Home Flu test positive. Mucus DM Food doesn't taste right, even crackers or water. 1. ONSET: When did the cough begin?      Tuesday 3. SPUTUM: Describe the color of your sputum (e.g., none, dry cough; clear, white, yellow, green)     green 4. HEMOPTYSIS: Are you coughing up any blood? If Yes, ask: How much? (e.g., flecks, streaks, tablespoons, etc.)     Denies 5. DIFFICULTY BREATHING: Are you having difficulty breathing? If Yes, ask: How bad is it? (e.g., mild, moderate, severe)      Denies 6. FEVER: Do you have a fever? If Yes, ask: What is your temperature, how was it measured, and when did it start?     Doesn't think so 7. CARDIAC HISTORY: Do you have any history of heart disease? (e.g., heart attack, congestive heart failure)      CHF, defibrillator 8. LUNG HISTORY: Do you have any history of lung disease?  (e.g., pulmonary embolus, asthma, emphysema)     Denies 9. PE RISK FACTORS: Do you have a history of blood clots? (or: recent major surgery, recent prolonged travel, bedridden)     Denies 10. OTHER SYMPTOMS: Do you have any other symptoms? (e.g., runny nose,  wheezing, chest pain)       Runny nose, fatigue 12. TRAVEL: Have you traveled out of the country in the last month? (e.g., travel history, exposures)       Denies  Protocols used: Cough - Acute Productive-A-AH  "

## 2024-03-30 ENCOUNTER — Encounter: Payer: Self-pay | Admitting: Family Medicine

## 2024-03-30 ENCOUNTER — Ambulatory Visit: Admitting: Family Medicine

## 2024-03-30 VITALS — BP 133/83 | HR 87 | Temp 97.7°F | Ht 68.0 in | Wt 185.5 lb

## 2024-03-30 DIAGNOSIS — J111 Influenza due to unidentified influenza virus with other respiratory manifestations: Secondary | ICD-10-CM

## 2024-03-30 MED ORDER — AMOXICILLIN-POT CLAVULANATE 875-125 MG PO TABS: 1.0000 | ORAL_TABLET | Freq: Two times a day (BID) | ORAL | 0 refills | Status: AC

## 2024-03-30 MED ORDER — OSELTAMIVIR PHOSPHATE 75 MG PO CAPS: 75.0000 mg | ORAL_CAPSULE | Freq: Two times a day (BID) | ORAL | 0 refills | Status: AC

## 2024-03-30 NOTE — Patient Instructions (Signed)
Medication as prescribed. ° °Rest. Fluids. ° °Take care ° °Dr. Dmetrius Ambs  °

## 2024-03-31 ENCOUNTER — Ambulatory Visit: Admitting: Nurse Practitioner

## 2024-04-03 DIAGNOSIS — J111 Influenza due to unidentified influenza virus with other respiratory manifestations: Secondary | ICD-10-CM | POA: Insufficient documentation

## 2024-04-03 NOTE — Assessment & Plan Note (Signed)
 Treating with Tamiflu . Given comorbidities, starting on Augmentin  to cover for secondary bacterial infection.

## 2024-04-03 NOTE — Progress Notes (Signed)
 "  Subjective:  [name], female    DOB: March 22, 1971  Age: 53 y.o. MRN: [id number]  CC:   Chief Complaint  Patient presents with   Acute Visit    Cough, sinus drainage, body aches, chills, temp changes, fatigue x 1.5 week.  Positive home flu test Saturday     HPI:  53 year old female with the below mentioned medical problems presents for evaluation of the above.  Has been sick for the past 1.5 weeks. Reports sinus drainage, runny nose, body aches, chills, fatigue. Positive home flu test on Saturday.  No fever today. No relieving factors. No other complaints.  Patient Active Problem List   Diagnosis Date Noted   Influenza 04/03/2024   Decreased pulses in feet 01/28/2024   Lower extremity edema 09/21/2023   Varicose veins of both lower extremities with pain 09/09/2022   Noncompliance 03/21/2022   CAD (coronary artery disease) 09/19/2021   Anxiety and depression 09/19/2021   OSA (obstructive sleep apnea) 09/30/2019   Chronic systolic heart failure (HCC) 02/04/2019   ICD (implantable cardioverter-defibrillator) in place 02/04/2019   Uncontrolled type 2 diabetes mellitus with hyperglycemia (HCC) 06/10/2018   Mixed hyperlipidemia 06/10/2018   Essential hypertension, benign 07/30/2012    Social Hx   Social History   Socioeconomic History   Marital status: Divorced    Spouse name: Not on file   Number of children: 1   Years of education: Not on file   Highest education level: Not on file  Occupational History   Occupation: [employer]  Tobacco Use   Smoking status: Never   Smokeless tobacco: Never  Vaping Use   Vaping status: Never Used  Substance and Sexual Activity   Alcohol use: No   Drug use: No   Sexual activity: Yes    Birth control/protection: Pill  Other Topics Concern   Not on file  Social History Narrative   Not on file   Social Drivers of Health   Tobacco Use: Low Risk (03/30/2024)   Patient History    Smoking Tobacco Use: Never     Smokeless Tobacco Use: Never    Passive Exposure: Not on file  Financial Resource Strain: Not on file  Food Insecurity: Food Insecurity Present (08/21/2023)   Hunger Vital Sign    Worried About Radiation Protection Practitioner of Food in the Last Year: Sometimes true    Ran Out of Food in the Last Year: Never true  Transportation Needs: No Transportation Needs (08/21/2023)   PRAPARE - Administrator, Civil Service (Medical): No    Lack of Transportation (Non-Medical): No  Physical Activity: Not on file  Stress: Not on file  Social Connections: Not on file  Depression (PHQ2-9): Low Risk (09/19/2023)   Depression (PHQ2-9)    PHQ-2 Score: 0  Alcohol Screen: Not on file  Housing: Unknown (08/21/2023)   Housing Stability Vital Sign    Unable to Pay for Housing in the Last Year: No    Number of Times Moved in the Last Year: Not on file    Homeless in the Last Year: No  Utilities: Not At Risk (08/21/2023)   AHC Utilities    Threatened with loss of utilities: No  Health Literacy: Not on file    Review of Systems Per HPI  Objective:  BP 133/83   Pulse 87   Temp 97.7 F (36.5 C)   Ht 5' 8 (1.727 m)   Wt 185 lb 8 oz (84.1 kg)  LMP 09/21/2019   SpO2 99%   BMI 28.21 kg/m      03/30/2024    9:53 AM 03/19/2024    1:59 PM 03/19/2024   11:33 AM  BP/Weight  Systolic BP 133 120 126  Diastolic BP 83 70 68  Wt. (Lbs) 185.5 189 189.6  BMI 28.21 kg/m2 28.74 kg/m2 29.26 kg/m2    Physical Exam Vitals and nursing note reviewed.  Constitutional:      General: She is not in acute distress.    Appearance: Normal appearance.  HENT:     Head: Normocephalic and atraumatic.  Eyes:     General:        Right eye: No discharge.        Left eye: No discharge.     Conjunctiva/sclera: Conjunctivae normal.  Cardiovascular:     Rate and Rhythm: Normal rate and regular rhythm.  Pulmonary:     Effort: Pulmonary effort is normal.     Breath sounds: Normal breath sounds. No wheezing, rhonchi or rales.   Neurological:     Mental Status: She is alert.     Lab Results  Component Value Date   WBC 6.9 05/16/2023   HGB 13.7 05/16/2023   HCT 40.5 05/16/2023   PLT 205 05/16/2023   GLUCOSE 334 (H) 10/28/2023   CHOL 194 09/16/2022   TRIG 111 09/16/2022   HDL 39 (L) 09/16/2022   LDLCALC 133 (H) 09/16/2022   ALT 30 09/16/2022   AST 21 09/16/2022   NA 134 (L) 10/28/2023   K 3.6 10/28/2023   CL 101 10/28/2023   CREATININE 0.99 10/28/2023   BUN 20 10/28/2023   CO2 23 10/28/2023   TSH 1.652 10/02/2021   INR 1.1 12/04/2019   HGBA1C 9.3 (A) 03/19/2024   MICROALBUR 80mg /L 12/18/2023     Assessment & Plan:  Influenza Assessment & Plan: Treating with Tamiflu . Given comorbidities, starting on Augmentin  to cover for secondary bacterial infection.  Orders: -     Oseltamivir  Phosphate; Take 1 capsule (75 mg total) by mouth 2 (two) times daily for 5 days.  Dispense: 10 capsule; Refill: 0 -     Amoxicillin -Pot Clavulanate; Take 1 tablet by mouth 2 (two) times daily.  Dispense: 14 tablet; Refill: 0    Follow-up:  Return if symptoms worsen or fail to improve.  Jacqulyn Ahle DO [facility] "

## 2024-04-07 LAB — COLOGUARD: COLOGUARD: NEGATIVE

## 2024-04-08 ENCOUNTER — Ambulatory Visit: Payer: Self-pay | Admitting: Gastroenterology

## 2024-04-08 NOTE — Progress Notes (Signed)
 Tara Bryant,  Your Cologuard test was negative.  I recommend you repeat a Cologuard in 3 years for colon cancer screening.

## 2024-04-19 ENCOUNTER — Ambulatory Visit: Attending: Student in an Organized Health Care Education/Training Program

## 2024-04-19 DIAGNOSIS — I5022 Chronic systolic (congestive) heart failure: Secondary | ICD-10-CM | POA: Diagnosis not present

## 2024-04-19 DIAGNOSIS — Z95810 Presence of automatic (implantable) cardiac defibrillator: Secondary | ICD-10-CM

## 2024-04-20 ENCOUNTER — Telehealth: Payer: Self-pay

## 2024-04-20 NOTE — Progress Notes (Signed)
 EPIC Encounter for ICM Monitoring  Patient Name: Tara Bryant is a 54 y.o. female Date: 04/20/2024 Primary Care Physican: Cook, Jayce G, DO Primary Cardiologist: [place] Electrophysiologist: [place] 04/16/2022 Weight: 195 lbs 09/16/2022 Office Weight: 193 lbs 05/29/2023 Weight: 191 lbs 10/09/2023 Office Weight: 188 lbs 10/28/2023 Weight: Not weighing at home 11/25/2023 Office Weight: 193 lbs                              Attempted call to patient and unable to reach.  Left detailed message per DPR regarding transmission.  Transmission results reviewed.    Since 03/08/2024 ICM Remote Transmission: [employer] Heart Failure Index 3 suggesting normal fluid levels.    Prescribed: Furosemide  40 mg Take 1 tablet by mouth daily + 20 mg tablet to = 60 mg daily.  Can do additional 20 mg daily for shortness of breath as needed  Potassium 10 mEq take by mouth daily   Labs: 10/28/2023 Creatinine 0.99, BUN 20, Potassium 3.6, Sodium 134, GFR >60 05/16/2023 Creatinine 0.93, BUN 21, Potassium 4.2, Sodium 134, GFR >60, Glucose 437 09/16/2022 Creatinine 0.78, BUN 14, Potassium 3.9, Sodium 135, GFR >60 A complete set of results can be found in Results Review.   Recommendations:  Left voice mail with ICM number and encouraged to call if experiencing any fluid symptoms.   Follow-up plan: ICM clinic phone appointment on 05/20/2024.   91 day device clinic remote transmission 05/03/2024.              EP/Cardiology next office visit:    Recall 11/23/2024 with EP APP.              Copy of ICM check sent to Dr.  Ronald.  Remote Monitoring Medically Necessary for Heart Failure Management.  3 Month [employer] Heart Failure Index:    8 Day Data Trend:          [name], RN 04/20/2024 3:34 PM

## 2024-04-20 NOTE — Telephone Encounter (Signed)
 Remote ICM transmission received.  Attempted call to patient regarding ICM remote transmission.  Left detailed message per DPR with ICM phone number to return call for any questions, concerns or fluid symptoms.

## 2024-05-03 ENCOUNTER — Ambulatory Visit

## 2024-05-03 DIAGNOSIS — I5022 Chronic systolic (congestive) heart failure: Secondary | ICD-10-CM | POA: Diagnosis not present

## 2024-05-05 LAB — CUP PACEART REMOTE DEVICE CHECK
Battery Remaining Longevity: 114 mo
Battery Remaining Percentage: 82 %
Brady Statistic RV Percent Paced: 0 %
Date Time Interrogation Session: 20260126044000
HighPow Impedance: 82 Ohm
Implantable Lead Connection Status: 753985
Implantable Lead Implant Date: 20200723
Implantable Lead Location: 753860
Implantable Lead Model: 293
Implantable Lead Serial Number: 443823
Implantable Pulse Generator Implant Date: 20200723
Lead Channel Impedance Value: 419 Ohm
Lead Channel Setting Pacing Amplitude: 2.5 V
Lead Channel Setting Pacing Pulse Width: 0.4 ms
Lead Channel Setting Sensing Sensitivity: 0.5 mV
Pulse Gen Serial Number: 256025
Zone Setting Status: 755011

## 2024-05-06 NOTE — Progress Notes (Signed)
 31 day ICM Remote transmission canceled due to [facility] clinic is on hold until further notice.  91 day remote monitoring will continue per protocol.

## 2024-05-09 ENCOUNTER — Ambulatory Visit: Payer: Self-pay | Admitting: Student in an Organized Health Care Education/Training Program

## 2024-05-10 NOTE — Progress Notes (Signed)
 Remote ICD Transmission

## 2024-05-20 ENCOUNTER — Ambulatory Visit

## 2024-07-23 ENCOUNTER — Ambulatory Visit: Admitting: Nurse Practitioner

## 2024-07-28 ENCOUNTER — Ambulatory Visit: Admitting: Family Medicine

## 2024-08-02 ENCOUNTER — Encounter

## 2024-11-01 ENCOUNTER — Encounter

## 2025-01-31 ENCOUNTER — Encounter
